# Patient Record
Sex: Female | Born: 1945
Health system: Southern US, Community
[De-identification: ages and names within clinical notes are randomized; demographics above are authoritative.]

## PROBLEM LIST (undated history)

## (undated) DIAGNOSIS — G459 Transient cerebral ischemic attack, unspecified: Secondary | ICD-10-CM

## (undated) DIAGNOSIS — F419 Anxiety disorder, unspecified: Secondary | ICD-10-CM

## (undated) DIAGNOSIS — E7800 Pure hypercholesterolemia, unspecified: Secondary | ICD-10-CM

## (undated) DIAGNOSIS — K219 Gastro-esophageal reflux disease without esophagitis: Secondary | ICD-10-CM

## (undated) DIAGNOSIS — D649 Anemia, unspecified: Secondary | ICD-10-CM

## (undated) DIAGNOSIS — M810 Age-related osteoporosis without current pathological fracture: Secondary | ICD-10-CM

## (undated) DIAGNOSIS — R112 Nausea with vomiting, unspecified: Secondary | ICD-10-CM

## (undated) DIAGNOSIS — T4145XA Adverse effect of unspecified anesthetic, initial encounter: Secondary | ICD-10-CM

## (undated) DIAGNOSIS — B958 Unspecified staphylococcus as the cause of diseases classified elsewhere: Secondary | ICD-10-CM

## (undated) DIAGNOSIS — E039 Hypothyroidism, unspecified: Secondary | ICD-10-CM

## (undated) DIAGNOSIS — F32A Depression, unspecified: Secondary | ICD-10-CM

## (undated) DIAGNOSIS — M1990 Unspecified osteoarthritis, unspecified site: Secondary | ICD-10-CM

## (undated) DIAGNOSIS — I10 Essential (primary) hypertension: Secondary | ICD-10-CM

## (undated) DIAGNOSIS — T8859XA Other complications of anesthesia, initial encounter: Secondary | ICD-10-CM

## (undated) DIAGNOSIS — F329 Major depressive disorder, single episode, unspecified: Secondary | ICD-10-CM

## (undated) DIAGNOSIS — Z9889 Other specified postprocedural states: Secondary | ICD-10-CM

## (undated) HISTORY — PX: HIP ARTHROPLASTY: SHX981

## (undated) HISTORY — PX: JOINT REPLACEMENT: SHX530

## (undated) HISTORY — PX: FOOT SURGERY: SHX648

## (undated) HISTORY — PX: THYROID SURGERY: SHX805

## (undated) HISTORY — PX: THYROIDECTOMY, PARTIAL: SHX18

## (undated) HISTORY — DX: Transient cerebral ischemic attack, unspecified: G45.9

## (undated) HISTORY — PX: LUMBAR SPINE SURGERY: SHX701

## (undated) HISTORY — PX: COLON SURGERY: SHX602

## (undated) HISTORY — PX: HERNIA REPAIR: SHX51

## (undated) HISTORY — PX: CERVICAL FUSION: SHX112

## (undated) HISTORY — PX: ABDOMINAL HYSTERECTOMY: SHX81

## (undated) HISTORY — PX: REPLACEMENT TOTAL KNEE: SUR1224

---
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
## 2000-10-30 ENCOUNTER — Other Ambulatory Visit: Admission: RE | Admit: 2000-10-30 | Discharge: 2000-10-30 | Payer: Self-pay | Admitting: *Deleted

## 2005-09-25 ENCOUNTER — Inpatient Hospital Stay (HOSPITAL_COMMUNITY): Admission: RE | Admit: 2005-09-25 | Discharge: 2005-09-29 | Payer: Self-pay | Admitting: Orthopedic Surgery

## 2005-09-27 ENCOUNTER — Ambulatory Visit: Payer: Self-pay | Admitting: Physical Medicine & Rehabilitation

## 2007-09-04 ENCOUNTER — Ambulatory Visit (HOSPITAL_BASED_OUTPATIENT_CLINIC_OR_DEPARTMENT_OTHER): Admission: RE | Admit: 2007-09-04 | Discharge: 2007-09-04 | Payer: Self-pay | Admitting: Orthopedic Surgery

## 2008-04-01 ENCOUNTER — Ambulatory Visit (HOSPITAL_COMMUNITY): Admission: RE | Admit: 2008-04-01 | Discharge: 2008-04-01 | Payer: Self-pay | Admitting: Internal Medicine

## 2009-08-31 ENCOUNTER — Emergency Department (HOSPITAL_COMMUNITY): Admission: EM | Admit: 2009-08-31 | Discharge: 2009-08-31 | Payer: Self-pay | Admitting: Emergency Medicine

## 2009-10-18 ENCOUNTER — Encounter: Payer: Self-pay | Admitting: Orthopedic Surgery

## 2009-10-27 ENCOUNTER — Ambulatory Visit: Payer: Self-pay | Admitting: Orthopedic Surgery

## 2009-10-27 DIAGNOSIS — S5000XA Contusion of unspecified elbow, initial encounter: Secondary | ICD-10-CM | POA: Insufficient documentation

## 2009-10-27 DIAGNOSIS — S53106A Unspecified dislocation of unspecified ulnohumeral joint, initial encounter: Secondary | ICD-10-CM | POA: Insufficient documentation

## 2010-07-04 NOTE — Assessment & Plan Note (Signed)
Summary: fx elbow bringing film/guarino/bsf   Vital Signs:  Patient profile:   65 year old female Height:      61 inches Weight:      202 pounds Pulse rate:   70 / minute Resp:     16 per minute  Vitals Entered By: Fuller Canada MD (Oct 27, 2009 11:29 AM)  Visit Type:  new patinet Referring Provider:  Dr. Wende Crease Primary Provider:  Dr. Regino Schultze  CC:  right elbow.  History of Present Illness: I saw [name] in the office today for an initial visit.  She is a 65 years old woman with the complaint of:  right elbow pain.  DOI 10/24/09 fell. she fell with her elbow in flexion had a slight loss of flexion in the elbow and went to the Dr. Izetta Dakin straighten her arm after she moved it some more she was able to straighten it to get the x-ray there was questionable radial head fracture she does not have any pain today except when she tries to open a door and that's only occasionally.  Tramadol now for pain.  Xrays [facility]'s office 10/26/09.  Declined sling.  Meds: Tramadol, Synthroid, Diclofenac, Biopr/HCTZ/  Allergies (verified): 1)  ! Sulfa  Past History:  Past Medical History: thyroid arthritis  Past Surgical History: thyroidectomy hysterectomy cervical fusion hernia lower back MRSA infection knee replacemnt hip replacement bunion removed  Family History: FH of Cancer:  Family History of Diabetes Hx, family, chronic respiratory condition  Social History: Patient is widowed.  volunteer no smoking no alcohl  1 cup coffee and 24 oz diet pepsi  Review of Systems Constitutional:  Complains of fatigue; denies weight loss, weight gain, fever, and chills. Respiratory:  Complains of snoring; denies short of breath, wheezing, couch, tightness, pain on inspiration, and snoring . Musculoskeletal:  Complains of joint pain, instability, and stiffness; denies swelling, redness, heat, and muscle pain. Psychiatric:  Complains of depression; denies nervousness, anxiety,  and hallucinations.  The review of systems is negative for Cardiovascular, Gastrointestinal, Genitourinary, Neurologic, Endocrine, Skin, HEENT, Immunology, and Hemoatologic.  Physical Exam  Msk:  no deformity or scoliosis noted with normal posture and gait Pulses:  pulses normal in all 4 extremities Extremities:  full range of motion now in the RIGHT elbow with no tenderness along the bones. Neurologic:  no focal deficits, CN II-XII grossly intact with normal reflexes, coordination, muscle strength and tone Skin:  intact without lesions or rashes Psych:  alert and cooperative; normal mood and affect; normal attention span and concentration   Impression & Recommendations:  Problem # 1:  SUBLUXATION, RADIAL HEAD (ICD-832.00) Assessment New  Orders: New Patient Level II (16109)  Problem # 2:  CONTUSION, ELBOW (ICD-923.11) Assessment: New  Orders: New Patient Level II (60454)  Patient Instructions: 1)  PROBABLE SUBLUXATION 2)  USE AS TOLERATED  3)  LIMIT LIFTING FOR 1 MONTH

## 2010-07-04 NOTE — Letter (Signed)
Summary: Historay form  Historay form   Imported By: Jacklynn Ganong 10/28/2009 09:33:04  _____________________________________________________________________  External Attachment:    Type:   Image     Comment:   External Document

## 2010-08-28 LAB — BASIC METABOLIC PANEL
BUN: 9 mg/dL (ref 6–23)
GFR calc Af Amer: >60 mL/min (ref >60)
GFR calc non Af Amer: >60 mL/min (ref >60)
Potassium: 3.8 mEq/L (ref 3.5–5.1)
Sodium: 131 mEq/L — ABNORMAL LOW (ref 135–145)

## 2010-08-28 LAB — CBC
HCT: 39.7 % (ref 36.0–46.0)
MCHC: 35.5 g/dL (ref 30.0–36.0)
MCV: 90.2 fL (ref 78.0–100.0)
Platelets: 234 10*3/uL (ref 150–400)
RBC: 4.4 MIL/uL (ref 3.87–5.11)
WBC: 6.6 10*3/uL (ref 4.0–10.5)

## 2010-10-17 NOTE — Op Note (Signed)
NAMEMAYLEY, [name]              ACCOUNT NO.:  [id number]   MEDICAL RECORD NO.:  [id number]          PATIENT TYPE:  AMB   LOCATION:  DSC                          FACILITY:  [facility]   PHYSICIAN:  Rodney A. Mortenson, M.D.DATE OF BIRTH:  1945/06/15   DATE OF PROCEDURE:  09/04/2007  DATE OF DISCHARGE:                               OPERATIVE REPORT   JUSTIFICATION:  A 65 year old female with a rather long history of pain  about the left foot.  She has had progressive hallux valgus deformity  and a huge bunion in these areas.  This was now become quite painful.  She is now admitted for correction of the hallux valgus and bunionectomy  and osteotomy of the first metatarsal.  Questions were answered and  encouraged preoperatively.  Complications discussed extensively.  The  patient wished to proceed.   JUSTIFICATION FOR OUTPATIENT SURGERY:  Minimal morbidity.   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   OPERATION:  Silver bunionectomy and advancement medial capsule,  metacarpophalangeal joint left great toe; crescent osteotomy base first  metatarsal left foot and bone grafting.   SURGEON:  Lenard Galloway. Chaney Malling, M.D.   ANESTHESIA:  General.   PROCEDURE:  The patient placed on the operating table in supine  position.  Pneumatic tourniquet about the left upper extremity.  After  satisfactory general anesthesia, left lower extremity was prepped with  DuraPrep and draped out in the usual manner.  Leg was wrapped out in  Esmarch and tourniquet was elevated.  Attention was first turned to the  great toe MP joint.  Incision made on the medial border at  the base of  proximal phalanx and carried proximally to the large exostosis.  Skin  edges were retracted.  Great care was taken to avoid injury to  superficial cutaneous nerves.  The capsule was cleared and a flap of the  capsule placed distally was made.  The capsule was flipped distally.  This exposed the huge exostosis and a significant hallux valgus  deformity.  The valgus deformity could be corrected very easily. There  was no tightness of the adductor as we assumed preoperatively.  There  was huge exostosis we then removed.  A power saw and a rongeur was used  to smooth up the edges.  Saline was used to flush the joint out from  bony debris.  Excellent decompression capsule was achieved.  The toe was  then placed in an anatomic position and the capsule advanced proximally  and sutured together with 2-0 Vicryl.  This realigned the toe very  nicely.  Attention turned to the base of the first metatarsal.  Skin  incision made dorsally.  Skin edges were retracted.  Dissection carried  down to the joint.  C-arm was used throughout to localize the first  metatarsal, first cuneiform  joint.  About 1-1.5 cm distal to the joint.  A crescent osteotomy saw was used with the dome placed distally.  Osteotomy was then made.  The first metatarsal was then brought from its  deformity and realigned with the second toe.  This was held in position  very snugly and a large Steinmann pin was placed across the first  metatarsal into the first cuneiform and the metatarsal joints.  X-rays  were taken and there was excellent realignment of the first metatarsal  primus varus deformity.  First and second metatarsal now exactly  parallel.  I was very pleased with the position.  The end of the wire  was then amputated.  The exostosis which removed from the great toe MP  joint was debrided of cartilage and ground up in small fragments and  this was placed about the osteotomy site at the base of the first   metatarsal as a bone graft.  4-0 nylon was then used to close the skin  edges both proximally and distally.  A large bulky pressure dressing  applied and the patient returned to the recovery room in excellent  condition.  Technically this procedure went extremely well and I was  very pleased with the final x-ray and surgical outcome.   DISPOSITION:  1. She may weightbear on the heel as tolerated.  2. Percocet for pain.  3. Usual postop instructions.  4. Return to my office on Wednesday.      Rodney A. Chaney Malling, M.D.  Electronically Signed     RAM/MEDQ  D:  09/04/2007  T:  09/04/2007  Job:  [id number]

## 2010-10-20 NOTE — Discharge Summary (Signed)
[name], [name]              ACCOUNT NO.:  [id number]   MEDICAL RECORD NO.:  [id number]          PATIENT TYPE:  INP   LOCATION:  5018                         FACILITY:  [facility]   PHYSICIAN:  Rodney A. Mortenson, M.D.DATE OF BIRTH:  1945/08/30   DATE OF ADMISSION:  09/25/2005  DATE OF DISCHARGE:  09/29/2005                                 DISCHARGE SUMMARY   ADMISSION DIAGNOSES:  1.  End-stage osteoarthritis/avascular necrosis right hip.  2.  Hypothyroidism.  3.  Hypertension.  4.  Obesity.  5.  History of right total knee arthroplasty.  6.  History of staphylococcal infection abdomen after previous abdominal      surgery.   DISCHARGE DIAGNOSES:  1.  Status post right total hip arthroplasty.  2.  Acute blood loss anemia secondary to surgery without symptoms of anemia.  3.  Tachycardic secondary to probable anxiety and pain.  4.  Hypothyroidism.  5.  Hypertension.  6.  Obesity.  7.  History of right total knee arthroplasty.  8.  History of staphylococcal infection abdomen after previous abdominal      surgery.   HISTORY OF PRESENT ILLNESS:  [name] is a 65 year old white female with  a history of right knee pain since replacement done in 2001 at [place].  The  patient has intermittent complaints of right hip pain for the past 6 years  or so; however, the patient with increased pain over the last 4 months.  No  known injury to the hip.  She did have bone graft harvest from the iliac  crest on the right side for a cervical fusion.   At this point, the right hip pain is described as constant, aching sensation  in the buttocks and groin region with some radiation to the mid thigh.  The  patient describes the pain as a spasm-like pain which is increased with  walking or standing, decreased with lying down flat.  Mechanical symptoms  positive for catching, grinding, and giving-way of the right hip.  She does  have waking pain.  The patient had difficulty donning shoes,  socks.  The  patient take Vicodin and diclofenac for pain which gives her a moderate  amount of relief.  The patient does use a wheelchair to lean on at times  when she has to ambulate long distances.   ALLERGIES:  SULFA causes facial edema.   CURRENT MEDICATIONS:  1.  Premarin 0.65 mg one tablet p.o. q.a.m.  2.  Synthroid 0.1 mg one tablet p.o. q.a.m.  3.  Lisinopril 10 mg one tablet p.o. q.a.m.  4.  Diclofenac 75 mg one tablet p.o. b.i.d., last dose September 18, 2005.  5.  Vicodin 5/325 one to two tablets every 4-6 hours for pain.   SURGICAL PROCEDURE:  The patient was taken to the operating room on September 25, 2005, by Dr. Rinaldo Ratel, assisted by Dr. Norlene Campbell and Rexene Edison, P.A.-C.  The patient was placed under general anesthesia and  underwent a right total hip arthroplasty using the following components:  A  large AMK porous-coated stem, 13 x 5 mm,  with a Pinnacle Marathon acetabular  liner, 52-mm outside diameter and 32-mm inside diameter porous coated; Apex  hole eliminator; and a Pinnacle 100 acetabular cup with a 52-mm outside  diameter and 32 -mm femoral head with a +5 ball.  It should also be noted  that the patient had removal of a large lipoma in the middle of the  incision.  The patient tolerated the procedure well and returned to recovery  in good stable condition.   CONSULTS:  The following consults were ordered while the patient was  hospitalized:  PT, OT, case management, rehab.   HOSPITAL COURSE:  Postoperative day #1 the patient afebrile, vital signs  stable.  H&H 11.7 and 31.1.  White count was 8900.  The patient's pain under  good control.  The patient with poor appetite, otherwise doing well.   Postoperative day #2 the patient with some nausea which was improving.  T-  max was 101.3.  Vital signs otherwise stable.  H&H 9.6 and 27.7.  White  count 9600.   Postoperative day #3 the patient denies chest pain, shortness of breath,  nausea, vomiting,  dysuria.  No weakness or dizziness when up with PT.  Some  anxiety secondary to previous postoperative infection.  The patient also  with increased pain overnight and early this a.m.  Continued to slowly  advance diet.  The patient was afebrile, tachycardic at 110.  Otherwise,  blood pressure was 115/70, respiratory rate at 20, O2 saturations 93% on  room air.  H&H 9.2, 26.4.  White count was 7400.  UA showed few bacteria,  positive for protein but negative for leukocytes or nitrites.  White blood  cells were 0-2.  The patient's right hip with moderate serosanguineous  drainage, some bleeding; otherwise, wound without signs of infection.  Tension sutures and staples intact, skin well approximated.  K-pad was  applied to the right hip due to the serous drainage.  Tachycardia without  any other symptoms of anemia was felt to be secondary to anxiety and pain  and pain control was to be the goal for postoperative day #3.  Otherwise,  the patient worked with physical therapy and probable discharge in a.m.   Postoperative day #4 the patient doing well and was discharged home.   LABORATORY:  Routine labs on admission:  CBC dated September 19, 2005:  White  count was 4700, hemoglobin 15.1, hematocrit 42.5, platelets 267.  Coags:  All values within normal limits.  Routine chemistries on admission:  Sodium  was low at 132, potassium 4.7, chloride 96, bicarb 25, glucose was 97, BUN  was low at 5, creatinine 0.8, calcium was 9.5.  Hepatic enzymes on  admission:  All values were within normal limits.  UA on admission:  All  values within normal limits.  X-rays:  Two-view hip dated September 25, 2005,  showed a right hip prosthesis in appropriate position, no complicating  features.  EKG dated September 19, 2005, showed normal sinus rhythm, incomplete  bundle-branch block.  Heart rate was 73 beats per minute, PR interval 150  milliseconds, PRT axis 48, and __________ of 6362.   DISCHARGE INSTRUCTIONS: 1.   Medications:  The patient was to resume home medications except for      Vicodin while on Percocet.  No aspirin while on Arixtra.  These      medications were added:      1.  Arixtra 2.5 mg one injection daily at 7 a.m., last dose Oct 05, 2005.  2.  Percocet 5/325 one to two tablets every 4-6 hours for pain.      3.  Iron 325 mg one tablet daily x28 days.      4.  Once finished with Arixtra add 81 mg aspirin once daily x20 days.  2.  Diet:  No restrictions.  3.  Activity:  The patient is partial weightbearing on the right leg with a      walker.  4.  Wound care:  The patient is to keep wound clean and dry, change dressing      daily.  May shower after 2 days with no drainage.  Call office if any      signs of infection or poor pain control.  5.  Followup:  The patient needs follow up with Dr. Chaney Malling the following      Wednesday from discharge.  The patient is to call for office at [phone]      for appointment.  6.  Home health PT per [place].      Richardean Canal, P.A.    ______________________________  Lenard Galloway Chaney Malling, M.D.    GC/MEDQ  D:  11/12/2005  T:  11/12/2005  Job:  [id number]

## 2010-10-20 NOTE — H&P (Signed)
[name], [name]              ACCOUNT NO.:  [id number]   MEDICAL RECORD NO.:  [id number]           PATIENT TYPE:   LOCATION:                                 FACILITY:   PHYSICIAN:  Legrand Pitts. Duffy, P.A.   DATE OF BIRTH:  Jan 19, 1946   DATE OF ADMISSION:  09/25/2005  DATE OF DISCHARGE:                                HISTORY & PHYSICAL   CHIEF COMPLAINT:  Right hip pain since 2001.   HISTORY OF PRESENT ILLNESS:  This 65 year old white female patient presented  to Dr. Chaney Malling with a history of a right knee replacement by a doctor in  [place] in 2001.  She has had intermittent complaints of right hip pain for  the last six years or so but over the last four months, she had a real  sudden onset of fairly severe right hip pain.  She has had no specific  injury to her hip, and the only surgery she has had was a bone graft harvest  from the iliac crest for a cervical fusion.   At this point the right hip pain is described as a constant aching sensation  in the right buttock and groin area with some radiation into the midthigh.  It feels almost like a spasm in that area.  Pain increases with walking or  standing and then decreases with lying down flat, ice to the area or  occasionally heat to her thigh.  The hip does catch, grind and give a way at  times.  It occasionally keeps her up at night, and she has difficulty  putting on her socks and shoes, especially sliding her foot in her shoe.  No  popping, and swelling or her back pain or paresthesias.  She is currently  taking Vicodin and diclofenac for pain, and that provides a moderate amount  of relief.  She does lean on a wheelchair at times when she has to walk a  long distance.   ALLERGIES:  SULFA causes facial edema.   CURRENT MEDICATIONS:  1.  Premarin 0.65 mg one tablet p.o. q.a.m..  2.  Synthroid 0.1 mg one tablet p.o. q.a.m.Marland Kitchen  3.  Lisinopril 10 mg one tablet p.o. q.a.m.Marland Kitchen  4.  Diclofenac 75 mg one tablet p.o. b.i.d., last  dose to be September 18, 2005.  5.  Vicodin 5/325 mg one to two tablets p.o. q.4-6h. p.r.n. for pain.   PAST MEDICAL HISTORY:  1.  Hypothyroidism after surgery for calcification in the thyroid gland.  2.  Hypertension.   She denies any history of diabetes mellitus, hiatal hernia, reflux, peptic  ulcer disease, heart disease or any other chronic medical condition other  than noted previously.   PAST SURGICAL HISTORY:  1.  Partial thyroidectomy, 1992.  2.  Hysterectomy, 1993.  3.  Cervical fusion with bone graft from the right hip, 1994.  4.  Exploratory laparotomy due to intestinal blockage complicated by postop      staph infection, 1995.  5.  Hernia surgery, 1995.  6.  Lumbar laminectomy, 1997.  7.  Exploratory laparotomy due to return of her staph  infection, 2000.  8.  Right total knee arthroplasty, 2001.  9.  Right neck Groshong catheter, 1995.   The only complication she has had from surgery was the staph infection.   SOCIAL HISTORY:  She denies any history of cigarette smoking, alcohol use or  drug use.  She is a widow, and her husband died five years ago.  She lives  with one person, her mom, in a one-story house.  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  Her medical doctor is Dr. Karleen Hampshire  in [place].   FAMILY HISTORY:  Mother has a history of heart disease, hypertension and  diabetes.  Father had a history of her heart attack and heart disease.  Grandmother had a history of breast cancer.   She does wear glasses.  She does have a history of hypertension and the  thyroid problems as previously indicated.  All other systems are negative  and noncontributory.   PHYSICAL EXAMINATION:  GENERAL:  Well-developed, well-nourished, overweight  white female in no acute distress.  Walks with a limp and an antalgic gait.  Mood and affect are appropriate.  Talks easily with examiner.  Height 5 feet  3 inches, weight 196 pounds, BMI 34.  VITAL SIGNS:   Temperature 98 degrees Fahrenheit, pulse 88, respirations 16  and BP 156/84.  HEENT:  Normocephalic, atraumatic, without frontal or maxillary sinus  tenderness to palpation.  Conjunctivae pink.  Sclerae anicteric.  PERRLA.  EOMs intact.  Glasses in place.  Hearing grossly intact.  No visible  external ear deformities.  Tympanic membranes pearly gray bilaterally with  good light reflex.  Nose: The nasal septum midline.  Nasal mucosa pink and  moist without exudates or polyps noted.  Buccal mucosa pink and moist.  Dentition in good repair.  Pharynx without erythema or exudates.  Tongue and  uvula midline.  Tongue without fasciculations and uvula rises equally with  phonation.  NECK:  No visible masses or lesions noted other than a well-healed thyroid  scar at the base of the neck.  Trachea midline.  No palpable lymphadenopathy  or thyromegaly.  Carotids +2 bilaterally without bruits.  Full range of  motion, nontender to palpation along the cervical spine.  CARDIOVASCULAR:  Heart rate and rhythm regular.  S1, S2 present without  rubs, clicks or murmurs noted.  RESPIRATORY:  Respirations even and unlabored.  Breath sounds clear to  auscultation bilaterally without rales or wheezes noted.  ABDOMEN:  Well-healed midline abdominal incision.  Bowel sounds present x4  quadrants.  Soft, nontender to palpation without hepatosplenomegaly or CVA  tenderness.  BACK:  Well-healed low lumbar incision line.  Mild tenderness to palpation  over the healed incision line.  No other tenderness to palpation along the  vertebral column.  VASCULAR:  Femoral pulses +2 bilaterally.  BREASTS/GENITOURINARY/RECTAL/PELVIC:  These exams deferred at this time.  MUSCULOSKELETAL:  No obvious deformities bilateral upper extremities with  full range of motion of these extremities without pain.  She has full range of motion of her left hip and knee and bilateral ankles and toes.  DP and PT  pulses are +2.  No lower  extremity edema.  No calf pain with palpation and  negative Homans' sign bilaterally.   Right knee has a well-healed midline incision.  She has full extension and  flexion to 100 degrees without pain or crepitus.  No pain with palpation  about the knee.  It is stable to varus and valgus stress.  Negative anterior  drawer.  She has full extension of the hip but flexion only about 80-85  degrees.  Past that does cause pain.  She has about 20-30 degrees of  external rotation but no internal rotation, and attempts at that may cause  pain and her pelvis to rotate.  No pain with palpation about the hip itself  but pain with range of motion.   NEUROLOGIC:  Alert and oriented x3.  Cranial nerves II-XII are grossly  intact.  Strength 5/5 bilateral upper and lower extremities.  Rapid  alternating movements intact.  Deep tendon reflexes 2+ bilateral upper and  lower extremities.  Sensation intact to light touch.   RADIOLOGIC FINDINGS:  X-rays taken of her right hip in March 2007 show bone-  on-bone contact in the hip with what appears to be AVN of the femoral head  with flattening of the lateral half of the head.   IMPRESSION:  1.  End-stage osteoarthritis/avascular necrosis of the right hip.  2.  Hypothyroidism.  3.  Hypertension.  4.  Obesity.  5.  History of right total knee.  6.  History of staphylococcal infection in her abdomen after previous      abdominal surgery.   PLAN:  Ms. Heasley will be admitted to [facility] on September [facility], 2005, where she will undergo a right total hip arthroplasty by Dr. Rinaldo Ratel.  She will undergo all the routine preoperative laboratory tests  and studies prior to this procedure.  If she has any medical issues while  she is hospitalized, we will consult one the hospitalists.      Legrand Pitts Duffy, P.A.     KED/MEDQ  D:  09/12/2005  T:  09/12/2005  Job:  [id number]

## 2010-10-20 NOTE — Op Note (Signed)
[name], [name]              ACCOUNT NO.:  [id number]   MEDICAL RECORD NO.:  [id number]          PATIENT TYPE:  INP   LOCATION:  2899                         FACILITY:  [facility]   PHYSICIAN:  Lenard Galloway. Mortenson, M.D.DATE OF BIRTH:  1946/05/02   DATE OF PROCEDURE:  09/25/2005  DATE OF DISCHARGE:                                 OPERATIVE REPORT   PREOPERATIVE DIAGNOSIS:  End stage osteoarthritis, right hip.   POSTOPERATIVE DIAGNOSIS:  End stage osteoarthritis, right hip.   OPERATION PERFORMED:  Total hip replacement on the right using a large AMK  pore coated stem 13.5 mm with a Pinnacle Marathon acetabular liner, 52 mm  outside diameter, 32 mm inside diameter pore coated,  Apex hole eliminator,  and a Pinnacle 100 acetabular cup with a 52 mm outside diameter and a 32 mm  femoral head with a +5 ball; removal of large lipoma in middle of incision.   SURGEON:  Lenard Galloway. Chaney Malling, M.D.   ASSISTANT:  Claude Manges. Cleophas Dunker, M.D., [facility].   ANESTHESIA:  General.   DESCRIPTION OF PROCEDURE:  After satisfactory general anesthesia, the  patient was placed in full lateral position, right hip up.  Right lower  extremity was prepped with DuraPrep and draped out in the usual manner.  A  Vi-drape was placed over the operative site.  Posterior Southern incision  was made.  There was a huge lipoma directly in alignment with the incision.  The skin edges were retracted.  Bleeders were coagulated.  The lipoma was  debulked so access to tensor fascia lata and deeper recess could be  achieved.  A large self-retaining retractor was put in place.  Bleeders were  coagulated.  Tensor fascia lata was then split.  External rotators were then  identified and isolated.  A tag was placed in the piriformis.  It was  released and retracted to the midline.  The posterior part of the hip was  clearly seen.  This was the capsule.  Incision was made at the superior neck  of the femur and carried down to the  inferior neck and a vertical T incision  made through the capsule.  This exposed the femoral head very nicely and the  hip was dislocated.  A Bennett was placed down to the femoral neck and the  femoral neck was cut to appropriate length and angle and head was removed.   Attention was turned to the proximal femur.  A hole initiator was then used  and a canal finder passed down the femoral canal.  A series of fully fluted  reamers was passed down.  This reamed out to a 13 mm diameter.  Series of  broaches was passed down the femoral canal and ultimately the 13.5 large  stature femoral component seated very nicely in the calcar.  This was then  removed.   Attention was then turned to the acetabulum.  Cobra retractor placed about  the rim.  The labrum was removed.  The series of cheese cutter reamers was  used and this was reamed out to a 51 mm diameter and this was medialized  slightly.  There was good bleeding bone around the entire circumference.  A  50 mm trial was placed down and seated very nicely in the acetabulum.  The  final pore coated 100 series Pinnacle cup 52 mm outside diameter was then  driven down into the acetabulum and a perfect interference scratch fit was  achieved.  A trial poly was inserted, locked into position.  The broach was  passed down the femoral canal again and a +5 neck, 32 mm trial head was  inserted on the broach and the hip was relocated.  The hip was extremely  stable in all positions.  This was felt to be the appropriate length.  Leg  lengths were equal and they were identical to what they were preoperatively.  The hip was dislocated and all the trial components were removed.  The hip  was irrigated with copious amounts of saline solution throughout the  procedure.  Apex  hole eliminator was inserted.  The Pinnacle Marathon poly  was then snap fitted in place and fitted very nicely.  The final AML large  stature prosthesis was then driven down and seated  very nicely in the  calcar.  Perfect fit was achieved.  A +5 neck with a 32 mm ball was then  passed over the trunnion and tapped in place and a cold weld was achieved.  The hip was then relocated and put through a full range of motion.  Again it  was extremely stable.  Posterior hip capsule was closed almost anatomically  with interrupted heavy Ethibond sutures.  Hemovac drain was in the wound.  Tensor fascia was closed with interrupted Ethibond sutures.  Heavy Tycron  was used to close the deep subcutaneous area.  There was a very large lipoma  which had been partially excised.  It is felt that complete evacuation of  any dead space was necessary and large Prolene mattress sutures through the  skin and down deep were used in retention suture configuration.  This closed  the deep spaces very nicely.  The superficial space was then closed with 2-0  Vicryl.  Stainless steel staples used to close the skin.  Sterile dressings  were applied and the patient returned to the recovery room in excellent  condition. Technically, this procedure went extremely well.   DRAINS:  Hemovac.   COMPLICATIONS:  None.  I am very pleased with the surgical outcome.           ______________________________  Lenard Galloway. Chaney Malling, M.D.     RAM/MEDQ  D:  09/25/2005  T:  09/26/2005  Job:  [id number]

## 2011-02-27 LAB — POCT HEMOGLOBIN-HEMACUE: Hemoglobin: 15.6 — ABNORMAL HIGH

## 2013-03-12 ENCOUNTER — Other Ambulatory Visit (HOSPITAL_COMMUNITY): Payer: Self-pay | Admitting: Internal Medicine

## 2013-03-12 DIAGNOSIS — M8580 Other specified disorders of bone density and structure, unspecified site: Secondary | ICD-10-CM

## 2013-03-18 ENCOUNTER — Other Ambulatory Visit (HOSPITAL_COMMUNITY): Payer: Self-pay

## 2013-03-18 ENCOUNTER — Ambulatory Visit (HOSPITAL_COMMUNITY)
Admission: RE | Admit: 2013-03-18 | Discharge: 2013-03-18 | Disposition: A | Discharge status: Home / Self Care | Payer: Medicare Other | Source: Ambulatory Visit | Attending: Internal Medicine | Admitting: Internal Medicine

## 2013-03-18 DIAGNOSIS — Z780 Asymptomatic menopausal state: Secondary | ICD-10-CM | POA: Insufficient documentation

## 2013-03-18 DIAGNOSIS — M899 Disorder of bone, unspecified: Secondary | ICD-10-CM | POA: Insufficient documentation

## 2013-03-18 DIAGNOSIS — M8580 Other specified disorders of bone density and structure, unspecified site: Secondary | ICD-10-CM

## 2014-06-09 DIAGNOSIS — M47812 Spondylosis without myelopathy or radiculopathy, cervical region: Secondary | ICD-10-CM | POA: Diagnosis not present

## 2014-06-09 DIAGNOSIS — M9902 Segmental and somatic dysfunction of thoracic region: Secondary | ICD-10-CM | POA: Diagnosis not present

## 2014-06-09 DIAGNOSIS — M47814 Spondylosis without myelopathy or radiculopathy, thoracic region: Secondary | ICD-10-CM | POA: Diagnosis not present

## 2014-06-09 DIAGNOSIS — M9903 Segmental and somatic dysfunction of lumbar region: Secondary | ICD-10-CM | POA: Diagnosis not present

## 2014-06-09 DIAGNOSIS — M9901 Segmental and somatic dysfunction of cervical region: Secondary | ICD-10-CM | POA: Diagnosis not present

## 2014-06-14 DIAGNOSIS — M9901 Segmental and somatic dysfunction of cervical region: Secondary | ICD-10-CM | POA: Diagnosis not present

## 2014-06-14 DIAGNOSIS — M9903 Segmental and somatic dysfunction of lumbar region: Secondary | ICD-10-CM | POA: Diagnosis not present

## 2014-06-14 DIAGNOSIS — M9902 Segmental and somatic dysfunction of thoracic region: Secondary | ICD-10-CM | POA: Diagnosis not present

## 2014-06-14 DIAGNOSIS — M47814 Spondylosis without myelopathy or radiculopathy, thoracic region: Secondary | ICD-10-CM | POA: Diagnosis not present

## 2014-06-14 DIAGNOSIS — M47812 Spondylosis without myelopathy or radiculopathy, cervical region: Secondary | ICD-10-CM | POA: Diagnosis not present

## 2014-06-17 DIAGNOSIS — M9902 Segmental and somatic dysfunction of thoracic region: Secondary | ICD-10-CM | POA: Diagnosis not present

## 2014-06-17 DIAGNOSIS — M9903 Segmental and somatic dysfunction of lumbar region: Secondary | ICD-10-CM | POA: Diagnosis not present

## 2014-06-17 DIAGNOSIS — M47812 Spondylosis without myelopathy or radiculopathy, cervical region: Secondary | ICD-10-CM | POA: Diagnosis not present

## 2014-06-17 DIAGNOSIS — M47814 Spondylosis without myelopathy or radiculopathy, thoracic region: Secondary | ICD-10-CM | POA: Diagnosis not present

## 2014-06-17 DIAGNOSIS — M9901 Segmental and somatic dysfunction of cervical region: Secondary | ICD-10-CM | POA: Diagnosis not present

## 2014-06-22 DIAGNOSIS — H524 Presbyopia: Secondary | ICD-10-CM | POA: Diagnosis not present

## 2014-06-22 DIAGNOSIS — M47814 Spondylosis without myelopathy or radiculopathy, thoracic region: Secondary | ICD-10-CM | POA: Diagnosis not present

## 2014-06-22 DIAGNOSIS — H2513 Age-related nuclear cataract, bilateral: Secondary | ICD-10-CM | POA: Diagnosis not present

## 2014-06-22 DIAGNOSIS — M9901 Segmental and somatic dysfunction of cervical region: Secondary | ICD-10-CM | POA: Diagnosis not present

## 2014-06-22 DIAGNOSIS — M47812 Spondylosis without myelopathy or radiculopathy, cervical region: Secondary | ICD-10-CM | POA: Diagnosis not present

## 2014-06-22 DIAGNOSIS — H5203 Hypermetropia, bilateral: Secondary | ICD-10-CM | POA: Diagnosis not present

## 2014-06-22 DIAGNOSIS — M9903 Segmental and somatic dysfunction of lumbar region: Secondary | ICD-10-CM | POA: Diagnosis not present

## 2014-06-22 DIAGNOSIS — M9902 Segmental and somatic dysfunction of thoracic region: Secondary | ICD-10-CM | POA: Diagnosis not present

## 2014-07-06 DIAGNOSIS — M47814 Spondylosis without myelopathy or radiculopathy, thoracic region: Secondary | ICD-10-CM | POA: Diagnosis not present

## 2014-07-06 DIAGNOSIS — M47812 Spondylosis without myelopathy or radiculopathy, cervical region: Secondary | ICD-10-CM | POA: Diagnosis not present

## 2014-07-06 DIAGNOSIS — M9901 Segmental and somatic dysfunction of cervical region: Secondary | ICD-10-CM | POA: Diagnosis not present

## 2014-07-06 DIAGNOSIS — M9902 Segmental and somatic dysfunction of thoracic region: Secondary | ICD-10-CM | POA: Diagnosis not present

## 2014-07-06 DIAGNOSIS — M9903 Segmental and somatic dysfunction of lumbar region: Secondary | ICD-10-CM | POA: Diagnosis not present

## 2014-07-20 DIAGNOSIS — M9903 Segmental and somatic dysfunction of lumbar region: Secondary | ICD-10-CM | POA: Diagnosis not present

## 2014-07-20 DIAGNOSIS — M9902 Segmental and somatic dysfunction of thoracic region: Secondary | ICD-10-CM | POA: Diagnosis not present

## 2014-07-20 DIAGNOSIS — M47812 Spondylosis without myelopathy or radiculopathy, cervical region: Secondary | ICD-10-CM | POA: Diagnosis not present

## 2014-07-20 DIAGNOSIS — M47814 Spondylosis without myelopathy or radiculopathy, thoracic region: Secondary | ICD-10-CM | POA: Diagnosis not present

## 2014-07-20 DIAGNOSIS — M9901 Segmental and somatic dysfunction of cervical region: Secondary | ICD-10-CM | POA: Diagnosis not present

## 2014-08-03 DIAGNOSIS — M9903 Segmental and somatic dysfunction of lumbar region: Secondary | ICD-10-CM | POA: Diagnosis not present

## 2014-08-03 DIAGNOSIS — M9901 Segmental and somatic dysfunction of cervical region: Secondary | ICD-10-CM | POA: Diagnosis not present

## 2014-08-03 DIAGNOSIS — M47814 Spondylosis without myelopathy or radiculopathy, thoracic region: Secondary | ICD-10-CM | POA: Diagnosis not present

## 2014-08-03 DIAGNOSIS — M47812 Spondylosis without myelopathy or radiculopathy, cervical region: Secondary | ICD-10-CM | POA: Diagnosis not present

## 2014-08-03 DIAGNOSIS — M9902 Segmental and somatic dysfunction of thoracic region: Secondary | ICD-10-CM | POA: Diagnosis not present

## 2014-08-17 DIAGNOSIS — M47812 Spondylosis without myelopathy or radiculopathy, cervical region: Secondary | ICD-10-CM | POA: Diagnosis not present

## 2014-08-17 DIAGNOSIS — M9903 Segmental and somatic dysfunction of lumbar region: Secondary | ICD-10-CM | POA: Diagnosis not present

## 2014-08-17 DIAGNOSIS — M9902 Segmental and somatic dysfunction of thoracic region: Secondary | ICD-10-CM | POA: Diagnosis not present

## 2014-08-17 DIAGNOSIS — M9901 Segmental and somatic dysfunction of cervical region: Secondary | ICD-10-CM | POA: Diagnosis not present

## 2014-08-17 DIAGNOSIS — M47814 Spondylosis without myelopathy or radiculopathy, thoracic region: Secondary | ICD-10-CM | POA: Diagnosis not present

## 2014-08-18 DIAGNOSIS — I10 Essential (primary) hypertension: Secondary | ICD-10-CM | POA: Diagnosis not present

## 2014-08-18 DIAGNOSIS — E039 Hypothyroidism, unspecified: Secondary | ICD-10-CM | POA: Diagnosis not present

## 2014-08-20 DIAGNOSIS — E039 Hypothyroidism, unspecified: Secondary | ICD-10-CM | POA: Diagnosis not present

## 2014-08-20 DIAGNOSIS — G5621 Lesion of ulnar nerve, right upper limb: Secondary | ICD-10-CM | POA: Diagnosis not present

## 2014-08-20 DIAGNOSIS — E782 Mixed hyperlipidemia: Secondary | ICD-10-CM | POA: Diagnosis not present

## 2014-08-20 DIAGNOSIS — I10 Essential (primary) hypertension: Secondary | ICD-10-CM | POA: Diagnosis not present

## 2014-09-29 DIAGNOSIS — D1801 Hemangioma of skin and subcutaneous tissue: Secondary | ICD-10-CM | POA: Diagnosis not present

## 2014-12-07 ENCOUNTER — Ambulatory Visit (HOSPITAL_COMMUNITY)
Admission: RE | Admit: 2014-12-07 | Discharge: 2014-12-07 | Disposition: A | Discharge status: Home / Self Care | Payer: Medicare Other | Source: Ambulatory Visit | Attending: Internal Medicine | Admitting: Internal Medicine

## 2014-12-07 ENCOUNTER — Other Ambulatory Visit (HOSPITAL_COMMUNITY): Payer: Self-pay | Admitting: Internal Medicine

## 2014-12-07 DIAGNOSIS — R071 Chest pain on breathing: Secondary | ICD-10-CM

## 2014-12-07 DIAGNOSIS — S299XXA Unspecified injury of thorax, initial encounter: Secondary | ICD-10-CM | POA: Diagnosis not present

## 2014-12-07 DIAGNOSIS — Y939 Activity, unspecified: Secondary | ICD-10-CM | POA: Diagnosis not present

## 2014-12-07 DIAGNOSIS — R079 Chest pain, unspecified: Secondary | ICD-10-CM | POA: Diagnosis not present

## 2015-01-12 DIAGNOSIS — M9901 Segmental and somatic dysfunction of cervical region: Secondary | ICD-10-CM | POA: Diagnosis not present

## 2015-01-12 DIAGNOSIS — M9902 Segmental and somatic dysfunction of thoracic region: Secondary | ICD-10-CM | POA: Diagnosis not present

## 2015-01-12 DIAGNOSIS — M9903 Segmental and somatic dysfunction of lumbar region: Secondary | ICD-10-CM | POA: Diagnosis not present

## 2015-01-12 DIAGNOSIS — M47812 Spondylosis without myelopathy or radiculopathy, cervical region: Secondary | ICD-10-CM | POA: Diagnosis not present

## 2015-01-12 DIAGNOSIS — M47814 Spondylosis without myelopathy or radiculopathy, thoracic region: Secondary | ICD-10-CM | POA: Diagnosis not present

## 2015-01-17 DIAGNOSIS — M9902 Segmental and somatic dysfunction of thoracic region: Secondary | ICD-10-CM | POA: Diagnosis not present

## 2015-01-17 DIAGNOSIS — M47812 Spondylosis without myelopathy or radiculopathy, cervical region: Secondary | ICD-10-CM | POA: Diagnosis not present

## 2015-01-17 DIAGNOSIS — M9903 Segmental and somatic dysfunction of lumbar region: Secondary | ICD-10-CM | POA: Diagnosis not present

## 2015-01-17 DIAGNOSIS — M9901 Segmental and somatic dysfunction of cervical region: Secondary | ICD-10-CM | POA: Diagnosis not present

## 2015-01-17 DIAGNOSIS — M47814 Spondylosis without myelopathy or radiculopathy, thoracic region: Secondary | ICD-10-CM | POA: Diagnosis not present

## 2015-03-02 DIAGNOSIS — E782 Mixed hyperlipidemia: Secondary | ICD-10-CM | POA: Diagnosis not present

## 2015-03-02 DIAGNOSIS — E039 Hypothyroidism, unspecified: Secondary | ICD-10-CM | POA: Diagnosis not present

## 2015-03-02 DIAGNOSIS — I10 Essential (primary) hypertension: Secondary | ICD-10-CM | POA: Diagnosis not present

## 2015-04-04 ENCOUNTER — Ambulatory Visit (HOSPITAL_COMMUNITY): Payer: No Typology Code available for payment source

## 2015-04-04 ENCOUNTER — Other Ambulatory Visit (HOSPITAL_COMMUNITY): Payer: Self-pay | Admitting: Internal Medicine

## 2015-04-04 DIAGNOSIS — R319 Hematuria, unspecified: Secondary | ICD-10-CM

## 2015-04-22 ENCOUNTER — Other Ambulatory Visit (HOSPITAL_COMMUNITY): Payer: Self-pay | Admitting: Internal Medicine

## 2015-04-22 DIAGNOSIS — Z1231 Encounter for screening mammogram for malignant neoplasm of breast: Secondary | ICD-10-CM

## 2015-05-09 ENCOUNTER — Ambulatory Visit (HOSPITAL_COMMUNITY)
Admission: RE | Admit: 2015-05-09 | Discharge: 2015-05-09 | Disposition: A | Discharge status: Home / Self Care | Payer: Medicare Other | Source: Ambulatory Visit | Attending: Internal Medicine | Admitting: Internal Medicine

## 2015-05-09 DIAGNOSIS — Z1231 Encounter for screening mammogram for malignant neoplasm of breast: Secondary | ICD-10-CM | POA: Diagnosis not present

## 2015-08-31 DIAGNOSIS — E039 Hypothyroidism, unspecified: Secondary | ICD-10-CM | POA: Diagnosis not present

## 2015-08-31 DIAGNOSIS — E782 Mixed hyperlipidemia: Secondary | ICD-10-CM | POA: Diagnosis not present

## 2015-09-02 DIAGNOSIS — E039 Hypothyroidism, unspecified: Secondary | ICD-10-CM | POA: Diagnosis not present

## 2015-09-02 DIAGNOSIS — I10 Essential (primary) hypertension: Secondary | ICD-10-CM | POA: Diagnosis not present

## 2015-09-02 DIAGNOSIS — M5010 Cervical disc disorder with radiculopathy, unspecified cervical region: Secondary | ICD-10-CM | POA: Diagnosis not present

## 2015-09-02 DIAGNOSIS — E782 Mixed hyperlipidemia: Secondary | ICD-10-CM | POA: Diagnosis not present

## 2015-09-09 DIAGNOSIS — M25462 Effusion, left knee: Secondary | ICD-10-CM | POA: Diagnosis not present

## 2015-09-09 DIAGNOSIS — G5621 Lesion of ulnar nerve, right upper limb: Secondary | ICD-10-CM | POA: Diagnosis not present

## 2015-09-09 DIAGNOSIS — S93142A Subluxation of metatarsophalangeal joint of left great toe, initial encounter: Secondary | ICD-10-CM | POA: Diagnosis not present

## 2015-09-09 DIAGNOSIS — M1712 Unilateral primary osteoarthritis, left knee: Secondary | ICD-10-CM | POA: Diagnosis not present

## 2015-09-09 DIAGNOSIS — Z7952 Long term (current) use of systemic steroids: Secondary | ICD-10-CM | POA: Diagnosis not present

## 2015-09-09 DIAGNOSIS — M19042 Primary osteoarthritis, left hand: Secondary | ICD-10-CM | POA: Diagnosis not present

## 2015-09-09 DIAGNOSIS — S93141A Subluxation of metatarsophalangeal joint of right great toe, initial encounter: Secondary | ICD-10-CM | POA: Diagnosis not present

## 2015-09-09 DIAGNOSIS — M19071 Primary osteoarthritis, right ankle and foot: Secondary | ICD-10-CM | POA: Diagnosis not present

## 2015-09-09 DIAGNOSIS — M11242 Other chondrocalcinosis, left hand: Secondary | ICD-10-CM | POA: Diagnosis not present

## 2015-09-09 DIAGNOSIS — M810 Age-related osteoporosis without current pathological fracture: Secondary | ICD-10-CM | POA: Diagnosis not present

## 2015-09-09 DIAGNOSIS — M11241 Other chondrocalcinosis, right hand: Secondary | ICD-10-CM | POA: Diagnosis not present

## 2015-09-09 DIAGNOSIS — M19041 Primary osteoarthritis, right hand: Secondary | ICD-10-CM | POA: Diagnosis not present

## 2015-09-16 DIAGNOSIS — M8588 Other specified disorders of bone density and structure, other site: Secondary | ICD-10-CM | POA: Diagnosis not present

## 2015-10-10 DIAGNOSIS — M1712 Unilateral primary osteoarthritis, left knee: Secondary | ICD-10-CM | POA: Diagnosis not present

## 2015-10-10 DIAGNOSIS — Z7952 Long term (current) use of systemic steroids: Secondary | ICD-10-CM | POA: Diagnosis not present

## 2015-10-10 DIAGNOSIS — M8589 Other specified disorders of bone density and structure, multiple sites: Secondary | ICD-10-CM | POA: Diagnosis not present

## 2015-10-10 DIAGNOSIS — M19042 Primary osteoarthritis, left hand: Secondary | ICD-10-CM | POA: Diagnosis not present

## 2015-10-10 DIAGNOSIS — M19041 Primary osteoarthritis, right hand: Secondary | ICD-10-CM | POA: Diagnosis not present

## 2015-10-10 DIAGNOSIS — M1120 Other chondrocalcinosis, unspecified site: Secondary | ICD-10-CM | POA: Diagnosis not present

## 2015-10-10 DIAGNOSIS — M19071 Primary osteoarthritis, right ankle and foot: Secondary | ICD-10-CM | POA: Diagnosis not present

## 2015-10-10 DIAGNOSIS — G5621 Lesion of ulnar nerve, right upper limb: Secondary | ICD-10-CM | POA: Diagnosis not present

## 2015-11-01 DIAGNOSIS — S7001XA Contusion of right hip, initial encounter: Secondary | ICD-10-CM | POA: Diagnosis not present

## 2015-11-24 DIAGNOSIS — E039 Hypothyroidism, unspecified: Secondary | ICD-10-CM | POA: Diagnosis not present

## 2015-11-24 DIAGNOSIS — M180 Bilateral primary osteoarthritis of first carpometacarpal joints: Secondary | ICD-10-CM | POA: Diagnosis not present

## 2015-11-24 DIAGNOSIS — M25562 Pain in left knee: Secondary | ICD-10-CM | POA: Diagnosis not present

## 2015-11-24 DIAGNOSIS — Z79899 Other long term (current) drug therapy: Secondary | ICD-10-CM | POA: Diagnosis not present

## 2015-11-24 DIAGNOSIS — M5010 Cervical disc disorder with radiculopathy, unspecified cervical region: Secondary | ICD-10-CM | POA: Diagnosis not present

## 2016-01-16 DIAGNOSIS — M19071 Primary osteoarthritis, right ankle and foot: Secondary | ICD-10-CM | POA: Diagnosis not present

## 2016-01-16 DIAGNOSIS — G5621 Lesion of ulnar nerve, right upper limb: Secondary | ICD-10-CM | POA: Diagnosis not present

## 2016-01-16 DIAGNOSIS — M1120 Other chondrocalcinosis, unspecified site: Secondary | ICD-10-CM | POA: Diagnosis not present

## 2016-01-16 DIAGNOSIS — Z7952 Long term (current) use of systemic steroids: Secondary | ICD-10-CM | POA: Diagnosis not present

## 2016-01-16 DIAGNOSIS — M19042 Primary osteoarthritis, left hand: Secondary | ICD-10-CM | POA: Diagnosis not present

## 2016-01-16 DIAGNOSIS — M1712 Unilateral primary osteoarthritis, left knee: Secondary | ICD-10-CM | POA: Diagnosis not present

## 2016-01-16 DIAGNOSIS — M8589 Other specified disorders of bone density and structure, multiple sites: Secondary | ICD-10-CM | POA: Diagnosis not present

## 2016-01-16 DIAGNOSIS — M19041 Primary osteoarthritis, right hand: Secondary | ICD-10-CM | POA: Diagnosis not present

## 2016-04-17 DIAGNOSIS — M19071 Primary osteoarthritis, right ankle and foot: Secondary | ICD-10-CM | POA: Diagnosis not present

## 2016-04-17 DIAGNOSIS — M1712 Unilateral primary osteoarthritis, left knee: Secondary | ICD-10-CM | POA: Diagnosis not present

## 2016-04-17 DIAGNOSIS — Z7952 Long term (current) use of systemic steroids: Secondary | ICD-10-CM | POA: Diagnosis not present

## 2016-04-17 DIAGNOSIS — M19041 Primary osteoarthritis, right hand: Secondary | ICD-10-CM | POA: Diagnosis not present

## 2016-04-17 DIAGNOSIS — M8589 Other specified disorders of bone density and structure, multiple sites: Secondary | ICD-10-CM | POA: Diagnosis not present

## 2016-04-17 DIAGNOSIS — G5621 Lesion of ulnar nerve, right upper limb: Secondary | ICD-10-CM | POA: Diagnosis not present

## 2016-04-17 DIAGNOSIS — M1120 Other chondrocalcinosis, unspecified site: Secondary | ICD-10-CM | POA: Diagnosis not present

## 2016-04-17 DIAGNOSIS — M19042 Primary osteoarthritis, left hand: Secondary | ICD-10-CM | POA: Diagnosis not present

## 2016-04-24 DIAGNOSIS — H2513 Age-related nuclear cataract, bilateral: Secondary | ICD-10-CM | POA: Diagnosis not present

## 2016-04-24 DIAGNOSIS — H5203 Hypermetropia, bilateral: Secondary | ICD-10-CM | POA: Diagnosis not present

## 2016-04-24 DIAGNOSIS — H52203 Unspecified astigmatism, bilateral: Secondary | ICD-10-CM | POA: Diagnosis not present

## 2016-04-24 DIAGNOSIS — H524 Presbyopia: Secondary | ICD-10-CM | POA: Diagnosis not present

## 2016-05-15 DIAGNOSIS — Z23 Encounter for immunization: Secondary | ICD-10-CM | POA: Diagnosis not present

## 2016-05-21 DIAGNOSIS — R7301 Impaired fasting glucose: Secondary | ICD-10-CM | POA: Diagnosis not present

## 2016-05-21 DIAGNOSIS — R42 Dizziness and giddiness: Secondary | ICD-10-CM | POA: Diagnosis not present

## 2016-05-21 DIAGNOSIS — R5383 Other fatigue: Secondary | ICD-10-CM | POA: Diagnosis not present

## 2016-05-21 DIAGNOSIS — I10 Essential (primary) hypertension: Secondary | ICD-10-CM | POA: Diagnosis not present

## 2016-05-21 DIAGNOSIS — R110 Nausea: Secondary | ICD-10-CM | POA: Diagnosis not present

## 2016-05-21 DIAGNOSIS — E039 Hypothyroidism, unspecified: Secondary | ICD-10-CM | POA: Diagnosis not present

## 2016-05-31 DIAGNOSIS — M25569 Pain in unspecified knee: Secondary | ICD-10-CM | POA: Diagnosis not present

## 2016-05-31 DIAGNOSIS — F430 Acute stress reaction: Secondary | ICD-10-CM | POA: Diagnosis not present

## 2016-05-31 DIAGNOSIS — E039 Hypothyroidism, unspecified: Secondary | ICD-10-CM | POA: Diagnosis not present

## 2016-06-29 DIAGNOSIS — M25562 Pain in left knee: Secondary | ICD-10-CM | POA: Diagnosis not present

## 2016-06-29 DIAGNOSIS — M542 Cervicalgia: Secondary | ICD-10-CM | POA: Diagnosis not present

## 2016-07-02 DIAGNOSIS — M25569 Pain in unspecified knee: Secondary | ICD-10-CM | POA: Diagnosis not present

## 2016-07-02 DIAGNOSIS — Z01818 Encounter for other preprocedural examination: Secondary | ICD-10-CM | POA: Diagnosis not present

## 2016-07-30 DIAGNOSIS — M25562 Pain in left knee: Secondary | ICD-10-CM | POA: Diagnosis not present

## 2016-07-30 NOTE — H&P (Signed)
PREOPERATIVE H&P Patient ID: [id number] MRN: [id number] DOB/AGE: 1945/11/06 71 y.o.  Chief Complaint: OA LEFT KNEE  Planned Procedure Date: 08/21/16 Medical and Cardiac Clearance by Dr. Wende Neighbors    HPI: [name] is a 71 y.o. female with a history of depression, thyroidectomy, R TKA, R THA, Cervical fusion, and L-spine surgery who presents for evaluation of OA LEFT KNEE. The patient has a history of pain and functional disability in the left knee due to arthritis and has failed non-surgical conservative treatments for greater than 12 weeks to include NSAID's and/or analgesics, corticosteriod injections and activity modification.  Onset of symptoms was gradual, starting 2 years ago with gradually worsening course since that time.  Patient currently rates pain at 10 out of 10 with activity. Patient has night pain, worsening of pain with activity and weight bearing, pain that interferes with activities of daily living and pain with passive range of motion.  Patient has evidence of subchondral cysts, subchondral sclerosis, periarticular osteophytes and joint space narrowing by imaging studies. There is no active infection.  Past Medical History: Depression, thyroidectomy, R TKA, R THA, Cervical fusion, and L-spine surgery  Past Surgical History: Thyroidectomy 1984, R TKA 2001, R THA 2007, Cervical fusion 1993, L-spine surgery 1998, Hysterectomy 1989, Bowel obstruction w/ infxn 1995, [place] sx 2007.  Allergies  Allergen Reactions  . Sulfonamide Derivatives    Medications: Levothyroxine 75 mcg daily Bisoprolol / HCT 2.5/6.25 daily Diclofenac 75 mg BID Hydroxychlor 200 mg BID Duloxetine 30 mg daily  Social History: Widowed, never smoker. No etoh.  Family History: Mother: CV dz, DM. Father: MI.  Grandparents and child w/ Cancer.  ROS: Currently denies lightheadedness, dizziness, Fever, chills, CP, SOB.  No personal history of DVT, PE, MI, or CVA. No loose  teeth or dentures Occasional tinnitus, constipation. All other systems have been reviewed and were otherwise currently negative with the exception of those mentioned in the HPI and as above.  Objective: Vitals: Ht: 5'2" Wt: 189 Temp: 98.1 BP: 149/98 Pulse: 82 O2 96% on room air. Physical Exam: General: Alert, NAD.  Antalgic Gait.  Walker occasionally. HEENT: EOMI, Good Neck Extension  Pulm: No increased work of breathing.  Clear B/L A/P w/o crackle or wheeze.  CV: RRR, No m/g/r appreciated  GI: soft, NT, ND Neuro: Neuro without gross focal deficit.  Sensation intact distally Skin: No lesions in the area of chief complaint MSK/Surgical Site: Left knee w/o redness or sign of infection.  Mild effusion.  + JLT. ROM 0-115.  5/5 strength in extension and flexion.  +EHL/FHL.  NVI.  Stable Lachman's and varus and valgus stress.    Imaging Review Plain radiographs demonstrate severe degenerative joint disease of the left knee.   Assessment: OA LEFT KNEE Principal Problem:   Primary osteoarthritis of left knee Active Problems:   Depression   S/P thyroidectomy   S/P cervical spinal fusion   S/P total knee arthroplasty, right  Plan: Plan for Procedure(s): LEFT TOTAL KNEE ARTHROPLASTY  The patient history, physical exam, clinical judgement of the provider and imaging are consistent with end stage degenerative joint disease and total joint arthroplasty is deemed medically necessary. The treatment options including medical management, injection therapy, and arthroplasty were discussed at length. The risks and benefits of Procedure(s): LEFT TOTAL KNEE ARTHROPLASTY were presented and reviewed.  The risks of nonoperative treatment, versus surgical intervention including but not limited to continued pain, aseptic loosening, stiffness, dislocation/subluxation, infection, bleeding, nerve injury, blood  clots, cardiopulmonary complications, morbidity, mortality, among others were discussed. The patient  verbalizes understanding and wishes to proceed with the plan.  Patient is being admitted for inpatient treatment for surgery, pain control, PT, OT, prophylactic antibiotics, VTE prophylaxis, progressive ambulation, ADL's and discharge planning.   Dental prophylaxis discussed and recommended for 2 years postoperatively.  The patient does meet the criteria for TXA which will be used perioperatively via IV.   ASA 325 mg will be used postoperatively for DVT prophylaxis in addition to SCDs, and early ambulation. The patient is planning to be discharged home with home health services ([facility]) in care of her niece who lives next door and 7 grandchildren.  Prudencio Burly III, PA-C 07/31/2016 10:49 AM

## 2016-07-31 DIAGNOSIS — Z981 Arthrodesis status: Secondary | ICD-10-CM

## 2016-07-31 DIAGNOSIS — E890 Postprocedural hypothyroidism: Secondary | ICD-10-CM

## 2016-07-31 DIAGNOSIS — F32A Depression, unspecified: Secondary | ICD-10-CM

## 2016-07-31 DIAGNOSIS — Z9889 Other specified postprocedural states: Secondary | ICD-10-CM

## 2016-07-31 DIAGNOSIS — M1712 Unilateral primary osteoarthritis, left knee: Secondary | ICD-10-CM

## 2016-07-31 DIAGNOSIS — F329 Major depressive disorder, single episode, unspecified: Secondary | ICD-10-CM

## 2016-07-31 DIAGNOSIS — Z96651 Presence of right artificial knee joint: Secondary | ICD-10-CM

## 2016-08-08 NOTE — Pre-Procedure Instructions (Addendum)
[name]  08/08/2016      [facility] - [place], [place] - [address] [place] [address] [place] [address] Phone: [phone] Fax: [phone]    Your procedure is scheduled on Tues. March 20  Report to [facility] Admitting at 8:30 A.M.  Call this number if you have problems the morning of surgery:  [phone]   Remember:  Do not eat food or drink liquids after midnight on Mon. March 19   Take these medicines the morning of surgery with A SIP OF WATER : cymbalta,  Levothyroxine (synthroid),               1 week prior to surgery stop:aspirin, advil, motrin, aleve, goody's, Bc Powders, Bayer Back and Body, diclofenac(voltaren), vitamins/herbal medicines.   Do not wear jewelry, make-up or nail polish.  Do not wear lotions, powders, or perfumes, or deoderant.  Do not shave 48 hours prior to surgery.  Men may shave face and neck.  Do not bring valuables to the hospital.  [facility] is not responsible for any belongings or valuables.  Contacts, dentures or bridgework may not be worn into surgery.  Leave your suitcase in the car.  After surgery it may be brought to your room.  For patients admitted to the hospital, discharge time will be determined by your treatment team.    Special instructions:   [facility]- Preparing For Surgery  Before surgery, you can play an important role. Because skin is not sterile, your skin needs to be as free of germs as possible. You can reduce the number of germs on your skin by washing with CHG (chlorahexidine gluconate) Soap before surgery.  CHG is an antiseptic cleaner which kills germs and bonds with the skin to continue killing germs even after washing.  Please do not use if you have an allergy to CHG or antibacterial soaps. If your skin becomes reddened/irritated stop using the CHG.  Do not shave (including legs and underarms) for at least 48 hours prior to first CHG shower. It is OK to shave your  face.  Please follow these instructions carefully.   1. Shower the NIGHT BEFORE SURGERY and the MORNING OF SURGERY with CHG.   2. If you chose to wash your hair, wash your hair first as usual with your normal shampoo.  3. After you shampoo, rinse your hair and body thoroughly to remove the shampoo.  4. Use CHG as you would any other liquid soap. You can apply CHG directly to the skin and wash gently with a scrungie or a clean washcloth.   5. Apply the CHG Soap to your body ONLY FROM THE NECK DOWN.  Do not use on open wounds or open sores. Avoid contact with your eyes, ears, mouth and genitals (private parts). Wash genitals (private parts) with your normal soap.  6. Wash thoroughly, paying special attention to the area where your surgery will be performed.  7. Thoroughly rinse your body with warm water from the neck down.  8. DO NOT shower/wash with your normal soap after using and rinsing off the CHG Soap.  9. Pat yourself dry with a CLEAN TOWEL.   10. Wear CLEAN PAJAMAS   11. Place CLEAN SHEETS on your bed the night of your first shower and DO NOT SLEEP WITH PETS.    Day of Surgery: Do not apply any deodorants/lotions. Please wear clean clothes to the hospital/surgery center.  Please read over the following fact sheets that you were given. Coughing and Deep Breathing, Total Joint Packet and MRSA Information

## 2016-08-09 ENCOUNTER — Encounter (HOSPITAL_COMMUNITY)
Admission: RE | Admit: 2016-08-09 | Discharge: 2016-08-09 | Disposition: A | Discharge status: Home / Self Care | Payer: PPO | Source: Ambulatory Visit | Attending: Orthopedic Surgery | Admitting: Orthopedic Surgery

## 2016-08-09 ENCOUNTER — Encounter (HOSPITAL_COMMUNITY): Payer: Self-pay

## 2016-08-09 DIAGNOSIS — Z981 Arthrodesis status: Secondary | ICD-10-CM | POA: Insufficient documentation

## 2016-08-09 DIAGNOSIS — Z01812 Encounter for preprocedural laboratory examination: Secondary | ICD-10-CM | POA: Diagnosis not present

## 2016-08-09 DIAGNOSIS — M1712 Unilateral primary osteoarthritis, left knee: Secondary | ICD-10-CM | POA: Insufficient documentation

## 2016-08-09 DIAGNOSIS — I10 Essential (primary) hypertension: Secondary | ICD-10-CM | POA: Diagnosis not present

## 2016-08-09 DIAGNOSIS — E890 Postprocedural hypothyroidism: Secondary | ICD-10-CM | POA: Insufficient documentation

## 2016-08-09 DIAGNOSIS — K219 Gastro-esophageal reflux disease without esophagitis: Secondary | ICD-10-CM | POA: Insufficient documentation

## 2016-08-09 HISTORY — DX: Unspecified osteoarthritis, unspecified site: M19.90

## 2016-08-09 HISTORY — DX: Essential (primary) hypertension: I10

## 2016-08-09 HISTORY — DX: Unspecified staphylococcus as the cause of diseases classified elsewhere: B95.8

## 2016-08-09 HISTORY — DX: Other complications of anesthesia, initial encounter: T88.59XA

## 2016-08-09 HISTORY — DX: Gastro-esophageal reflux disease without esophagitis: K21.9

## 2016-08-09 HISTORY — DX: Adverse effect of unspecified anesthetic, initial encounter: T41.45XA

## 2016-08-09 HISTORY — DX: Other specified postprocedural states: Z98.890

## 2016-08-09 HISTORY — DX: Hypothyroidism, unspecified: E03.9

## 2016-08-09 HISTORY — DX: Other specified postprocedural states: R11.2

## 2016-08-09 LAB — BASIC METABOLIC PANEL
ANION GAP: 8 (ref 5–15)
BUN: 6 mg/dL (ref 6–20)
CHLORIDE: 97 mmol/L — AB (ref 101–111)
CO2: 27 mmol/L (ref 22–32)
Calcium: 9.3 mg/dL (ref 8.9–10.3)
Creatinine, Ser: 0.71 mg/dL (ref 0.44–1.00)
GFR calc non Af Amer: >60 mL/min (ref >60)
Glucose, Bld: 91 mg/dL (ref 65–99)
Potassium: 3.9 mmol/L (ref 3.5–5.1)
Sodium: 132 mmol/L — ABNORMAL LOW (ref 135–145)

## 2016-08-09 LAB — CBC
HCT: 39.7 % (ref 36.0–46.0)
HEMOGLOBIN: 13.9 g/dL (ref 12.0–15.0)
MCH: 31.2 pg (ref 26.0–34.0)
MCHC: 35 g/dL (ref 30.0–36.0)
MCV: 89 fL (ref 78.0–100.0)
Platelets: 223 10*3/uL (ref 150–400)
RBC: 4.46 MIL/uL (ref 3.87–5.11)
RDW: 12.2 % (ref 11.5–15.5)
WBC: 3.1 10*3/uL — ABNORMAL LOW (ref 4.0–10.5)

## 2016-08-09 LAB — SURGICAL PCR SCREEN
MRSA, PCR: NEGATIVE
Staphylococcus aureus: NEGATIVE

## 2016-08-09 NOTE — Progress Notes (Signed)
PCP:Dr. Casimer Leek in [place] request ekg.  Pt. Has red sportic rash on hands/fingers and right upper arm. States it itches and it appeared 1 week ago.Pt. thinls it coming from [place] but isn't sure.  Notified [employer], [place]. Instructed pt. To se PCP and to inform Dr. Alain Marion. Pt. Stated she would.

## 2016-08-10 ENCOUNTER — Ambulatory Visit (INDEPENDENT_AMBULATORY_CARE_PROVIDER_SITE_OTHER): Payer: PPO | Admitting: Neurology

## 2016-08-10 ENCOUNTER — Encounter (INDEPENDENT_AMBULATORY_CARE_PROVIDER_SITE_OTHER): Payer: Self-pay

## 2016-08-10 DIAGNOSIS — Z981 Arthrodesis status: Secondary | ICD-10-CM

## 2016-08-10 DIAGNOSIS — R202 Paresthesia of skin: Secondary | ICD-10-CM | POA: Diagnosis not present

## 2016-08-10 DIAGNOSIS — Z0289 Encounter for other administrative examinations: Secondary | ICD-10-CM

## 2016-08-10 DIAGNOSIS — R531 Weakness: Secondary | ICD-10-CM | POA: Insufficient documentation

## 2016-08-10 NOTE — Progress Notes (Signed)
Anesthesia Chart Review:  Pt is a 71 year old female scheduled for L total knee arthroplasty on 08/21/2016 with Edmonia Lynch, M.D.  - PCP is Delphina Cahill, MD  PMH includes: HTN, hypothyroidism, post-op N/V, GERD. Never smoker. BMI 36.  Anesthesia history includes: Patient reports she "holds her breath when waking up"  Medications include bisoprolol-HCTZ, Plaquenil, levothyroxine  Preoperative labs reviewed.    EKG requested from PCP's office. If it does not arrive in time, obtain EKG DOS.   Pt reported rash on hands and RUE x1 week.  PAT RN instructed pt to see PCP and notify Dr. Debroah Loop office.   If rash resolved and EKG acceptable, I anticipate pt can proceed as scheduled.   Willeen Cass, FNP-BC [facility]/Anesthesiology Phone: [phone] 08/10/2016 1:03 PM

## 2016-08-10 NOTE — Procedures (Signed)
Full Name: [name] Gender: Female MRN #: [id number] Date of Birth: 1945/11/28    Visit Date: 08/10/2016 09:54 Age: 71 Years 41 Months Old Examining Physician: Marcial Pacas, MD  Referring Physician: Percell Miller, MD History: 71 year old right-handed female, with history of right cervical decompression in 1990s for right cervical radiculopathy, presented with a year history of right 5th, ulnar half of right 4th finger paresthesia, significant right hand muscle weakness, atrophy, right elbow discomfort, she denies neck pain.   On examination:  Significant right hand intrinsic muscle atrophy, there was no weakness at right proximal arm and wrist muscles. Right finger abduction 3, finger flexion ulnar half 4, mild right wrist flexion weakness. She has significant right elbow Tinel sign.  She has with light touch and pinprick involving right fifth, ulnar half of fourth fingers, extending to ulnar half of right palm and right dorsum hand.  Summary of the test: Nerve conduction study: Right ulnar sensory responses were absent. Right ulnar to ADM and FDI motor responses were absent.   Left ulnar sensory and motor responses were within normal limits.  Bilateral median sensory responses showed mildly prolonged peak latency, with preserved map amplitude.  Bilateral median motor responses were within normal limits. Bilateral ulnar sensory responses were normal.  Electromyography: Selected needle examination was performed at right upper extremity, right cervical paraspinal muscles.  There is evidence of active neuropathic changes involving right first dorsal interossei, abductor digiti minimi, mild neuropathic changes involving right flexor carpi ulnaris. There was no evidence of spontaneous activity at right cervical paraspinal muscles.  Conclusion: This is an abnormal study. There is electrodiagnostic evidence of active axonal right ulnar neuropathy, before the takeoff branch to the right  flexor carpi ulnaris, This will localize the lesion to right elbow.    ------------------------------- Marcial Pacas,  M.D.  [facility] [place], [place] [address] Tel: [phone] Fax: [phone]        [facility]    Nerve / Sites Rec. Site Peak Lat Ref.  Amp Ref. Segments Distance Peak Diff Ref.    ms ms V V  cm ms ms  R Ulnar - Orthodromic, (Dig V, Mid palm)     Dig V Wrist NR ?3.1 NR ?5 Dig V - Wrist 11    R Median - Orthodromic (Dig II, Mid palm)     Dig II Wrist 3.8 ?3.4 13 ?10 Dig II - Wrist 13    R Radial - Anatomical snuff box (Forearm)     Forearm Wrist 2.7 ?2.9 7 ?15 Forearm - Wrist 10    L Radial - Anatomical snuff box (Forearm)     Forearm Wrist 2.5 ?2.9 14 ?15 Forearm - Wrist 10    L Median - Orthodromic (Dig II, Mid palm)     Dig II Wrist 3.8 ?3.4 7 ?10 Dig II - Wrist 13    L Median, Ulnar - Transcarpal comparison     Median Palm Wrist 2.6 ?2.2 21 ?40 Median Palm - Wrist 8       Ulnar Palm Wrist 2.1 ?2.2 18 ?12 Ulnar Palm - Wrist 8          Median Palm - Ulnar Palm  0.5 ?0.4  L Ulnar - Orthodromic, (Dig V, Mid palm)     Dig V Wrist 2.6 ?3.1 9 ?5 Dig V - Wrist 11  [place]    Nerve / Sites Muscle Latency Ref. Amplitude Ref. Rel Amp Segments Distance Lat Diff Velocity Ref. Area    ms ms mV mV %  cm ms m/s m/s mVms  R Median - APB     Wrist APB 4.2 ?4.4 6.8 ?4.0 100 Wrist - APB 7    25.3     Upper arm APB 7.9  6.7  98.6 Upper arm - Wrist 20 3.7 54  25.1  R Ulnar - ADM     Wrist ADM NR ?3.3 NR ?6.0 NR Wrist - ADM 7    NR     B.Elbow ADM NR  NR  NR B.Elbow - Wrist  NR  ?49 NR         A.Elbow - Wrist  NR     R Ulnar - FDI     Wrist FDI NR ?4.5 NR ?7.0 NR Wrist - FDI 8    NR     B.Elbow FDI NR  NR  NR B.Elbow - Wrist  NR  ?49 NR         A.Elbow - Wrist  NR     L Median - APB     Wrist APB 4.4 ?4.4 6.5 ?4.0 100 Wrist - APB 7    26.1     Upper arm APB 8.3  6.2  95.3 Upper arm - Wrist 20 3.9 51  25.1  L Ulnar - ADM      Wrist ADM 2.8 ?3.3 6.8 ?6.0 100 Wrist - ADM 7    25.5     B.Elbow ADM 6.2  5.6  82.6 B.Elbow - Wrist 17 3.4 50 ?49 20.7     A.Elbow ADM 8.2  5.5  98.6 A.Elbow - B.Elbow 10 2.0 49 ?49 20.1         A.Elbow - Wrist  5.4                  F  Wave    Nerve F Lat Ref.   ms ms  R Median - APB 27.1 ?31.0  L Median - APB 27.6 ?31.0  L Ulnar - ADM 27.6 ?32.0           EMG full       EMG Summary Table    Spontaneous MUAP Recruitment  Muscle IA Fib PSW Fasc Other Amp Dur. Poly Pattern  R. First dorsal interosseous Increased 1+ None None _______ Increased Increased 1+   R. Abductor digiti minimi (manus) Increased 1+ 1+ None _______ Increased Increased 1+ Reduced  R. Pronator teres Normal None None None _______ Normal Normal Normal Normal  R. Deltoid Normal None None None _______ Normal Normal Normal Normal  R. Biceps brachii Normal None None None _______ Normal Normal Normal Normal  R. Flexor carpi ulnaris Increased None None None _______ Increased Increased Normal Reduced  R. Extensor digitorum communis Normal None None None _______ Normal Normal Normal Normal  R. Cervical paraspinals Normal None None None _______ Normal Normal Normal Normal

## 2016-08-14 DIAGNOSIS — L301 Dyshidrosis [pompholyx]: Secondary | ICD-10-CM | POA: Diagnosis not present

## 2016-08-20 MED ORDER — TRANEXAMIC ACID 1000 MG/10ML IV SOLN
1000.0000 mg | INTRAVENOUS | Status: AC
  Administered 2016-08-21: 1000 mg via INTRAVENOUS
  Filled 2016-08-20: qty 10

## 2016-08-21 ENCOUNTER — Inpatient Hospital Stay (HOSPITAL_COMMUNITY): Payer: PPO | Admitting: Certified Registered Nurse Anesthetist

## 2016-08-21 ENCOUNTER — Inpatient Hospital Stay (HOSPITAL_COMMUNITY): Payer: PPO

## 2016-08-21 ENCOUNTER — Encounter (HOSPITAL_COMMUNITY)
Admission: RE | Disposition: A | Discharge status: Home / Self Care | Payer: Self-pay | Source: Ambulatory Visit | Attending: Orthopedic Surgery

## 2016-08-21 ENCOUNTER — Inpatient Hospital Stay (HOSPITAL_COMMUNITY): Payer: PPO | Admitting: Vascular Surgery

## 2016-08-21 ENCOUNTER — Encounter (HOSPITAL_COMMUNITY): Payer: Self-pay | Admitting: Certified Registered Nurse Anesthetist

## 2016-08-21 ENCOUNTER — Inpatient Hospital Stay (HOSPITAL_COMMUNITY)
Admission: RE | Admit: 2016-08-21 | Discharge: 2016-08-22 | DRG: 470 | Disposition: A | Discharge status: Home / Self Care | Payer: PPO | Source: Ambulatory Visit | Attending: Orthopedic Surgery | Admitting: Orthopedic Surgery

## 2016-08-21 DIAGNOSIS — E039 Hypothyroidism, unspecified: Secondary | ICD-10-CM | POA: Diagnosis not present

## 2016-08-21 DIAGNOSIS — K219 Gastro-esophageal reflux disease without esophagitis: Secondary | ICD-10-CM | POA: Diagnosis not present

## 2016-08-21 DIAGNOSIS — Z96652 Presence of left artificial knee joint: Secondary | ICD-10-CM | POA: Diagnosis not present

## 2016-08-21 DIAGNOSIS — M25762 Osteophyte, left knee: Secondary | ICD-10-CM | POA: Diagnosis not present

## 2016-08-21 DIAGNOSIS — M1712 Unilateral primary osteoarthritis, left knee: Principal | ICD-10-CM | POA: Diagnosis present

## 2016-08-21 DIAGNOSIS — Z9889 Other specified postprocedural states: Secondary | ICD-10-CM

## 2016-08-21 DIAGNOSIS — F32A Depression, unspecified: Secondary | ICD-10-CM

## 2016-08-21 DIAGNOSIS — Z8249 Family history of ischemic heart disease and other diseases of the circulatory system: Secondary | ICD-10-CM

## 2016-08-21 DIAGNOSIS — Z882 Allergy status to sulfonamides status: Secondary | ICD-10-CM

## 2016-08-21 DIAGNOSIS — Z9089 Acquired absence of other organs: Secondary | ICD-10-CM

## 2016-08-21 DIAGNOSIS — E890 Postprocedural hypothyroidism: Secondary | ICD-10-CM | POA: Diagnosis not present

## 2016-08-21 DIAGNOSIS — Z79899 Other long term (current) drug therapy: Secondary | ICD-10-CM | POA: Diagnosis not present

## 2016-08-21 DIAGNOSIS — Z471 Aftercare following joint replacement surgery: Secondary | ICD-10-CM | POA: Diagnosis not present

## 2016-08-21 DIAGNOSIS — K5900 Constipation, unspecified: Secondary | ICD-10-CM | POA: Diagnosis present

## 2016-08-21 DIAGNOSIS — F329 Major depressive disorder, single episode, unspecified: Secondary | ICD-10-CM | POA: Diagnosis not present

## 2016-08-21 DIAGNOSIS — Z981 Arthrodesis status: Secondary | ICD-10-CM | POA: Diagnosis not present

## 2016-08-21 DIAGNOSIS — G8918 Other acute postprocedural pain: Secondary | ICD-10-CM | POA: Diagnosis not present

## 2016-08-21 DIAGNOSIS — Z90710 Acquired absence of both cervix and uterus: Secondary | ICD-10-CM

## 2016-08-21 DIAGNOSIS — I10 Essential (primary) hypertension: Secondary | ICD-10-CM | POA: Diagnosis present

## 2016-08-21 DIAGNOSIS — Z96651 Presence of right artificial knee joint: Secondary | ICD-10-CM

## 2016-08-21 DIAGNOSIS — Z833 Family history of diabetes mellitus: Secondary | ICD-10-CM

## 2016-08-21 DIAGNOSIS — Z809 Family history of malignant neoplasm, unspecified: Secondary | ICD-10-CM | POA: Diagnosis not present

## 2016-08-21 HISTORY — PX: TOTAL KNEE ARTHROPLASTY: SHX125

## 2016-08-21 SURGERY — ARTHROPLASTY, KNEE, TOTAL
Anesthesia: General | Site: Knee | Laterality: Left

## 2016-08-21 MED ORDER — PROPOFOL 10 MG/ML IV BOLUS
INTRAVENOUS | Status: DC | PRN
  Administered 2016-08-21: 50 mg via INTRAVENOUS
  Administered 2016-08-21: 150 mg via INTRAVENOUS

## 2016-08-21 MED ORDER — ONDANSETRON HCL 4 MG PO TABS: 4.0000 mg | ORAL_TABLET | Freq: Four times a day (QID) | ORAL | Status: DC | PRN

## 2016-08-21 MED ORDER — ACETAMINOPHEN 325 MG PO TABS
650.0000 mg | ORAL_TABLET | Freq: Four times a day (QID) | ORAL | Status: AC
  Administered 2016-08-22 (×3): 650 mg via ORAL
  Filled 2016-08-21 (×4): qty 2

## 2016-08-21 MED ORDER — MIDAZOLAM HCL 2 MG/2ML IJ SOLN
INTRAMUSCULAR | Status: AC
  Filled 2016-08-21: qty 2

## 2016-08-21 MED ORDER — PROPOFOL 10 MG/ML IV BOLUS
INTRAVENOUS | Status: AC
  Filled 2016-08-21: qty 20

## 2016-08-21 MED ORDER — ONDANSETRON HCL 4 MG/2ML IJ SOLN
INTRAMUSCULAR | Status: DC | PRN
  Administered 2016-08-21: 4 mg via INTRAVENOUS

## 2016-08-21 MED ORDER — ACETAMINOPHEN 650 MG RE SUPP: 650.0000 mg | Freq: Four times a day (QID) | RECTAL | Status: DC | PRN

## 2016-08-21 MED ORDER — SODIUM CHLORIDE 0.9 % IR SOLN
Status: DC | PRN
  Administered 2016-08-21: 3000 mL

## 2016-08-21 MED ORDER — DOCUSATE SODIUM 100 MG PO CAPS: 100.0000 mg | ORAL_CAPSULE | Freq: Two times a day (BID) | ORAL | 0 refills | Status: DC

## 2016-08-21 MED ORDER — KETOROLAC TROMETHAMINE 15 MG/ML IJ SOLN
INTRAMUSCULAR | Status: AC
  Filled 2016-08-21: qty 1

## 2016-08-21 MED ORDER — CHLORHEXIDINE GLUCONATE 4 % EX LIQD: 60.0000 mL | Freq: Once | CUTANEOUS | Status: DC

## 2016-08-21 MED ORDER — OMEPRAZOLE 20 MG PO CPDR: 20.0000 mg | DELAYED_RELEASE_CAPSULE | Freq: Every day | ORAL | 2 refills | Status: DC

## 2016-08-21 MED ORDER — METHOCARBAMOL 1000 MG/10ML IJ SOLN
500.0000 mg | Freq: Four times a day (QID) | INTRAVENOUS | Status: DC | PRN
  Filled 2016-08-21: qty 5

## 2016-08-21 MED ORDER — MENTHOL 3 MG MT LOZG
1.0000 | LOZENGE | OROMUCOSAL | Status: DC | PRN
  Administered 2016-08-22: 3 mg via ORAL
  Filled 2016-08-21: qty 9

## 2016-08-21 MED ORDER — ACETAMINOPHEN 500 MG PO TABS
ORAL_TABLET | ORAL | Status: AC
  Administered 2016-08-21: 1000 mg
  Filled 2016-08-21: qty 2

## 2016-08-21 MED ORDER — DEXAMETHASONE SODIUM PHOSPHATE 10 MG/ML IJ SOLN
10.0000 mg | Freq: Once | INTRAMUSCULAR | Status: AC
  Administered 2016-08-22: 10 mg via INTRAVENOUS
  Filled 2016-08-21: qty 1

## 2016-08-21 MED ORDER — DEXAMETHASONE SODIUM PHOSPHATE 4 MG/ML IJ SOLN
INTRAMUSCULAR | Status: DC | PRN
  Administered 2016-08-21: 10 mg via INTRAVENOUS

## 2016-08-21 MED ORDER — LABETALOL HCL 5 MG/ML IV SOLN
INTRAVENOUS | Status: AC
  Filled 2016-08-21: qty 4

## 2016-08-21 MED ORDER — PHENOL 1.4 % MT LIQD
1.0000 | OROMUCOSAL | Status: DC | PRN
Start: 2016-08-21 — End: 2016-08-22

## 2016-08-21 MED ORDER — CEFAZOLIN SODIUM-DEXTROSE 2-4 GM/100ML-% IV SOLN
2.0000 g | INTRAVENOUS | Status: AC
  Administered 2016-08-21: 2 g via INTRAVENOUS

## 2016-08-21 MED ORDER — LIDOCAINE HCL (CARDIAC) 20 MG/ML IV SOLN
INTRAVENOUS | Status: DC | PRN
  Administered 2016-08-21: 80 mg via INTRAVENOUS

## 2016-08-21 MED ORDER — GABAPENTIN 300 MG PO CAPS
300.0000 mg | ORAL_CAPSULE | Freq: Once | ORAL | Status: AC
  Administered 2016-08-21: 300 mg via ORAL

## 2016-08-21 MED ORDER — FENTANYL CITRATE (PF) 100 MCG/2ML IJ SOLN
INTRAMUSCULAR | Status: AC
  Administered 2016-08-21: 75 ug
  Filled 2016-08-21: qty 2

## 2016-08-21 MED ORDER — DIPHENHYDRAMINE HCL 12.5 MG/5ML PO ELIX: 12.5000 mg | ORAL_SOLUTION | ORAL | Status: DC | PRN

## 2016-08-21 MED ORDER — ASPIRIN EC 325 MG PO TBEC: 325.0000 mg | DELAYED_RELEASE_TABLET | Freq: Every day | ORAL | 0 refills | Status: DC

## 2016-08-21 MED ORDER — KETOROLAC TROMETHAMINE 30 MG/ML IJ SOLN
INTRAMUSCULAR | Status: DC | PRN
Start: 2016-08-21 — End: 2016-08-21
  Administered 2016-08-21: 30 mg

## 2016-08-21 MED ORDER — POLYETHYLENE GLYCOL 3350 17 G PO PACK: 17.0000 g | PACK | Freq: Every day | ORAL | Status: DC | PRN

## 2016-08-21 MED ORDER — METHOCARBAMOL 500 MG PO TABS
500.0000 mg | ORAL_TABLET | Freq: Four times a day (QID) | ORAL | Status: DC | PRN
  Administered 2016-08-22: 500 mg via ORAL
  Filled 2016-08-21: qty 1

## 2016-08-21 MED ORDER — DULOXETINE HCL 30 MG PO CPEP
30.0000 mg | ORAL_CAPSULE | Freq: Every day | ORAL | Status: DC
  Administered 2016-08-22: 30 mg via ORAL
  Filled 2016-08-21: qty 1

## 2016-08-21 MED ORDER — BACLOFEN 10 MG PO TABS: 10.0000 mg | ORAL_TABLET | Freq: Three times a day (TID) | ORAL | 0 refills | Status: DC | PRN

## 2016-08-21 MED ORDER — LACTATED RINGERS IV SOLN: INTRAVENOUS | Status: DC

## 2016-08-21 MED ORDER — LACTATED RINGERS IV SOLN
INTRAVENOUS | Status: DC
  Administered 2016-08-21 (×2): via INTRAVENOUS

## 2016-08-21 MED ORDER — ONDANSETRON HCL 4 MG/2ML IJ SOLN: 4.0000 mg | Freq: Four times a day (QID) | INTRAMUSCULAR | Status: DC | PRN

## 2016-08-21 MED ORDER — BUPIVACAINE HCL (PF) 0.25 % IJ SOLN
INTRAMUSCULAR | Status: AC
  Filled 2016-08-21: qty 10

## 2016-08-21 MED ORDER — BISOPROLOL-HYDROCHLOROTHIAZIDE 2.5-6.25 MG PO TABS
1.0000 | ORAL_TABLET | Freq: Every day | ORAL | Status: DC
  Administered 2016-08-21: 1 via ORAL
  Filled 2016-08-21: qty 1

## 2016-08-21 MED ORDER — SODIUM CHLORIDE FLUSH 0.9 % IV SOLN
INTRAVENOUS | Status: DC | PRN
  Administered 2016-08-21: 20 mL

## 2016-08-21 MED ORDER — ONDANSETRON HCL 4 MG PO TABS: 4.0000 mg | ORAL_TABLET | Freq: Three times a day (TID) | ORAL | 0 refills | Status: DC | PRN

## 2016-08-21 MED ORDER — MORPHINE SULFATE (PF) 2 MG/ML IV SOLN: 2.0000 mg | INTRAVENOUS | Status: DC | PRN

## 2016-08-21 MED ORDER — ACETAMINOPHEN 500 MG PO TABS
1000.0000 mg | ORAL_TABLET | Freq: Once | ORAL | Status: AC
  Administered 2016-08-21: 1000 mg via ORAL

## 2016-08-21 MED ORDER — FENTANYL CITRATE (PF) 100 MCG/2ML IJ SOLN
INTRAMUSCULAR | Status: AC
  Filled 2016-08-21: qty 4

## 2016-08-21 MED ORDER — KETOROLAC TROMETHAMINE 15 MG/ML IJ SOLN
7.5000 mg | Freq: Four times a day (QID) | INTRAMUSCULAR | Status: AC
Start: 2016-08-21 — End: 2016-08-22
  Administered 2016-08-21 – 2016-08-22 (×4): 7.5 mg via INTRAVENOUS
  Filled 2016-08-21 (×4): qty 1

## 2016-08-21 MED ORDER — 0.9 % SODIUM CHLORIDE (POUR BTL) OPTIME
TOPICAL | Status: DC | PRN
  Administered 2016-08-21: 1000 mL

## 2016-08-21 MED ORDER — OXYCODONE HCL 5 MG PO TABS
5.0000 mg | ORAL_TABLET | ORAL | Status: DC | PRN
  Administered 2016-08-22 (×2): 10 mg via ORAL
  Filled 2016-08-21 (×2): qty 2

## 2016-08-21 MED ORDER — DOCUSATE SODIUM 100 MG PO CAPS
100.0000 mg | ORAL_CAPSULE | Freq: Two times a day (BID) | ORAL | Status: DC
  Administered 2016-08-21 – 2016-08-22 (×2): 100 mg via ORAL
  Filled 2016-08-21 (×2): qty 1

## 2016-08-21 MED ORDER — BUPIVACAINE HCL (PF) 0.25 % IJ SOLN
INTRAMUSCULAR | Status: AC
  Filled 2016-08-21: qty 30

## 2016-08-21 MED ORDER — SUCCINYLCHOLINE CHLORIDE 20 MG/ML IJ SOLN
INTRAMUSCULAR | Status: DC | PRN
  Administered 2016-08-21: 100 mg via INTRAVENOUS

## 2016-08-21 MED ORDER — ACETAMINOPHEN 325 MG PO TABS
650.0000 mg | ORAL_TABLET | Freq: Four times a day (QID) | ORAL | Status: DC | PRN
  Administered 2016-08-21: 650 mg via ORAL

## 2016-08-21 MED ORDER — METOCLOPRAMIDE HCL 5 MG/ML IJ SOLN: 5.0000 mg | Freq: Three times a day (TID) | INTRAMUSCULAR | Status: DC | PRN

## 2016-08-21 MED ORDER — MIDAZOLAM HCL 5 MG/5ML IJ SOLN
INTRAMUSCULAR | Status: DC | PRN
  Administered 2016-08-21: 2 mg via INTRAVENOUS

## 2016-08-21 MED ORDER — LEVOTHYROXINE SODIUM 75 MCG PO TABS
75.0000 ug | ORAL_TABLET | Freq: Every day | ORAL | Status: DC
  Administered 2016-08-22: 75 ug via ORAL
  Filled 2016-08-21: qty 1

## 2016-08-21 MED ORDER — CEFAZOLIN SODIUM-DEXTROSE 2-4 GM/100ML-% IV SOLN
INTRAVENOUS | Status: AC
  Filled 2016-08-21: qty 100

## 2016-08-21 MED ORDER — FENTANYL CITRATE (PF) 100 MCG/2ML IJ SOLN
INTRAMUSCULAR | Status: DC | PRN
  Administered 2016-08-21: 25 ug via INTRAVENOUS
  Administered 2016-08-21: 75 ug via INTRAVENOUS
  Administered 2016-08-21: 125 ug via INTRAVENOUS
  Administered 2016-08-21: 25 ug via INTRAVENOUS
  Administered 2016-08-21: 100 ug via INTRAVENOUS

## 2016-08-21 MED ORDER — FENTANYL CITRATE (PF) 100 MCG/2ML IJ SOLN
INTRAMUSCULAR | Status: AC
  Filled 2016-08-21: qty 2

## 2016-08-21 MED ORDER — OXYCODONE HCL 5 MG PO TABS: 5.0000 mg | ORAL_TABLET | ORAL | Status: DC | PRN

## 2016-08-21 MED ORDER — METOPROLOL TARTRATE 5 MG/5ML IV SOLN
INTRAVENOUS | Status: DC | PRN
  Administered 2016-08-21: 5 mg via INTRAVENOUS

## 2016-08-21 MED ORDER — METOCLOPRAMIDE HCL 5 MG PO TABS: 5.0000 mg | ORAL_TABLET | Freq: Three times a day (TID) | ORAL | Status: DC | PRN

## 2016-08-21 MED ORDER — OXYCODONE-ACETAMINOPHEN 5-325 MG PO TABS: 1.0000 | ORAL_TABLET | ORAL | 0 refills | Status: DC | PRN

## 2016-08-21 MED ORDER — GABAPENTIN 300 MG PO CAPS
ORAL_CAPSULE | ORAL | Status: AC
  Administered 2016-08-21: 300 mg
  Filled 2016-08-21: qty 1

## 2016-08-21 MED ORDER — BUPIVACAINE HCL (PF) 0.25 % IJ SOLN
INTRAMUSCULAR | Status: DC | PRN
  Administered 2016-08-21: 30 mL

## 2016-08-21 MED ORDER — HYDROMORPHONE HCL 1 MG/ML IJ SOLN: 0.2500 mg | INTRAMUSCULAR | Status: DC | PRN

## 2016-08-21 MED ORDER — CEFAZOLIN SODIUM-DEXTROSE 2-4 GM/100ML-% IV SOLN
2.0000 g | Freq: Four times a day (QID) | INTRAVENOUS | Status: AC
  Administered 2016-08-21 – 2016-08-22 (×2): 2 g via INTRAVENOUS
  Filled 2016-08-21 (×2): qty 100

## 2016-08-21 MED ORDER — ACETAMINOPHEN 325 MG PO TABS: 650.0000 mg | ORAL_TABLET | Freq: Four times a day (QID) | ORAL | Status: DC

## 2016-08-21 MED ORDER — SENNA 8.6 MG PO TABS
1.0000 | ORAL_TABLET | Freq: Two times a day (BID) | ORAL | Status: DC
Start: 2016-08-21 — End: 2016-08-22
  Administered 2016-08-21 – 2016-08-22 (×2): 8.6 mg via ORAL
  Filled 2016-08-21 (×2): qty 1

## 2016-08-21 MED ORDER — ASPIRIN EC 325 MG PO TBEC
325.0000 mg | DELAYED_RELEASE_TABLET | Freq: Every day | ORAL | Status: DC
  Administered 2016-08-22: 325 mg via ORAL
  Filled 2016-08-21: qty 1

## 2016-08-21 MED ORDER — SORBITOL 70 % SOLN: 30.0000 mL | Freq: Every day | Status: DC | PRN

## 2016-08-21 MED ORDER — MIDAZOLAM HCL 2 MG/2ML IJ SOLN
INTRAMUSCULAR | Status: AC
  Administered 2016-08-21: 1.5 mg
  Filled 2016-08-21: qty 2

## 2016-08-21 SURGICAL SUPPLY — 68 items
ADH SKN CLS APL DERMABOND .7 (GAUZE/BANDAGES/DRESSINGS)
BANDAGE ACE 6X5 VEL STRL LF (GAUZE/BANDAGES/DRESSINGS) ×1 IMPLANT
BANDAGE ESMARK 6X9 LF (GAUZE/BANDAGES/DRESSINGS) ×1 IMPLANT
BLADE SAG 18X100X1.27 (BLADE) ×2 IMPLANT
BNDG CMPR 9X6 STRL LF SNTH (GAUZE/BANDAGES/DRESSINGS) ×1
BNDG COHESIVE 6X5 TAN STRL LF (GAUZE/BANDAGES/DRESSINGS) ×2 IMPLANT
BNDG ESMARK 6X9 LF (GAUZE/BANDAGES/DRESSINGS) ×2
BOWL SMART MIX CTS (DISPOSABLE) ×2 IMPLANT
CAPT KNEE TOTAL 3 ×1 IMPLANT
CEMENT BONE SIMPLEX SPEEDSET (Cement) ×4 IMPLANT
CLSR STERI-STRIP ANTIMIC 1/2X4 (GAUZE/BANDAGES/DRESSINGS) ×4 IMPLANT
COVER SURGICAL LIGHT HANDLE (MISCELLANEOUS) ×2 IMPLANT
CUFF TOURNIQUET SINGLE 34IN LL (TOURNIQUET CUFF) ×2 IMPLANT
DERMABOND ADVANCED (GAUZE/BANDAGES/DRESSINGS)
DERMABOND ADVANCED .7 DNX12 (GAUZE/BANDAGES/DRESSINGS) IMPLANT
DRAPE HALF SHEET 40X57 (DRAPES) ×2 IMPLANT
DRAPE U-SHAPE 47X51 STRL (DRAPES) ×2 IMPLANT
DRSG ADAPTIC 3X8 NADH LF (GAUZE/BANDAGES/DRESSINGS) ×2 IMPLANT
DRSG MEPILEX BORDER 4X12 (GAUZE/BANDAGES/DRESSINGS) ×1 IMPLANT
DRSG MEPILEX BORDER 4X8 (GAUZE/BANDAGES/DRESSINGS) ×2 IMPLANT
DURAPREP 26ML APPLICATOR (WOUND CARE) ×4 IMPLANT
ELECT CAUTERY BLADE 6.4 (BLADE) ×2 IMPLANT
ELECT REM PT RETURN 9FT ADLT (ELECTROSURGICAL) ×2
ELECTRODE REM PT RTRN 9FT ADLT (ELECTROSURGICAL) ×1 IMPLANT
FACESHIELD WRAPAROUND (MASK) ×4 IMPLANT
FACESHIELD WRAPAROUND OR TEAM (MASK) ×2 IMPLANT
GAUZE SPONGE 4X4 12PLY STRL (GAUZE/BANDAGES/DRESSINGS) ×2 IMPLANT
GLOVE BIO SURGEON STRL SZ7.5 (GLOVE) ×4 IMPLANT
GLOVE BIOGEL PI IND STRL 6.5 (GLOVE) IMPLANT
GLOVE BIOGEL PI IND STRL 7.0 (GLOVE) IMPLANT
GLOVE BIOGEL PI IND STRL 8 (GLOVE) ×2 IMPLANT
GLOVE BIOGEL PI INDICATOR 6.5 (GLOVE) ×1
GLOVE BIOGEL PI INDICATOR 7.0 (GLOVE) ×2
GLOVE BIOGEL PI INDICATOR 8 (GLOVE) ×2
GLOVE SURG SS PI 7.0 STRL IVOR (GLOVE) ×3 IMPLANT
GOWN STRL REUS W/ TWL LRG LVL3 (GOWN DISPOSABLE) ×2 IMPLANT
GOWN STRL REUS W/ TWL XL LVL3 (GOWN DISPOSABLE) ×1 IMPLANT
GOWN STRL REUS W/TWL LRG LVL3 (GOWN DISPOSABLE) ×4
GOWN STRL REUS W/TWL XL LVL3 (GOWN DISPOSABLE) ×2
HANDPIECE INTERPULSE COAX TIP (DISPOSABLE) ×2
IMMOBILIZER KNEE 22 (SOFTGOODS) ×1 IMPLANT
IMMOBILIZER KNEE 22 UNIV (SOFTGOODS) ×2 IMPLANT
IMMOBILIZER KNEE 24 THIGH 36 (MISCELLANEOUS) IMPLANT
IMMOBILIZER KNEE 24 UNIV (MISCELLANEOUS)
KIT BASIN OR (CUSTOM PROCEDURE TRAY) ×2 IMPLANT
KIT ROOM TURNOVER OR (KITS) ×2 IMPLANT
MANIFOLD NEPTUNE II (INSTRUMENTS) ×2 IMPLANT
NDL 18GX1X1/2 (RX/OR ONLY) (NEEDLE) ×1 IMPLANT
NEEDLE 18GX1X1/2 (RX/OR ONLY) (NEEDLE) ×2 IMPLANT
NS IRRIG 1000ML POUR BTL (IV SOLUTION) ×2 IMPLANT
PACK TOTAL JOINT (CUSTOM PROCEDURE TRAY) ×2 IMPLANT
PACK UNIVERSAL I (CUSTOM PROCEDURE TRAY) ×2 IMPLANT
PAD ARMBOARD 7.5X6 YLW CONV (MISCELLANEOUS) ×2 IMPLANT
SET HNDPC FAN SPRY TIP SCT (DISPOSABLE) IMPLANT
STAPLER VISISTAT 35W (STAPLE) IMPLANT
SUCTION FRAZIER HANDLE 10FR (MISCELLANEOUS) ×1
SUCTION TUBE FRAZIER 10FR DISP (MISCELLANEOUS) ×1 IMPLANT
SUT MNCRL AB 4-0 PS2 18 (SUTURE) ×2 IMPLANT
SUT MON AB 2-0 CT1 27 (SUTURE) ×4 IMPLANT
SUT VIC AB 0 CT1 27 (SUTURE) ×4
SUT VIC AB 0 CT1 27XBRD ANBCTR (SUTURE) ×1 IMPLANT
SUT VIC AB 1 CT1 27 (SUTURE) ×4
SUT VIC AB 1 CT1 27XBRD ANBCTR (SUTURE) ×2 IMPLANT
SYR 50ML LL SCALE MARK (SYRINGE) ×2 IMPLANT
TOWEL OR 17X24 6PK STRL BLUE (TOWEL DISPOSABLE) ×2 IMPLANT
TOWEL OR 17X26 10 PK STRL BLUE (TOWEL DISPOSABLE) ×2 IMPLANT
TRAY CATH 16FR W/PLASTIC CATH (SET/KITS/TRAYS/PACK) IMPLANT
TRAY FOLEY BAG SILVER LF 14FR (CATHETERS) IMPLANT

## 2016-08-21 NOTE — Anesthesia Procedure Notes (Signed)
Procedure Name: Intubation Date/Time: 08/21/2016 10:35 AM Performed by: Ollen Bowl Pre-anesthesia Checklist: Patient identified, Emergency Drugs available, Suction available and Patient being monitored Patient Re-evaluated:Patient Re-evaluated prior to inductionOxygen Delivery Method: Circle system utilized Preoxygenation: Pre-oxygenation with 100% oxygen Intubation Type: IV induction Ventilation: Mask ventilation without difficulty Laryngoscope Size: Miller and 2 Grade View: Grade I Tube type: Oral Number of attempts: 1 Airway Equipment and Method: Stylet Placement Confirmation: ETT inserted through vocal cords under direct vision,  positive ETCO2 and breath sounds checked- equal and bilateral Secured at: 24 cm Tube secured with: Tape Dental Injury: Teeth and Oropharynx as per pre-operative assessment

## 2016-08-21 NOTE — Anesthesia Postprocedure Evaluation (Signed)
Anesthesia Post Note  Patient: [name]  Procedure(s) Performed: Procedure(s) (LRB): LEFT TOTAL KNEE ARTHROPLASTY (Left)  Patient location during evaluation: PACU Anesthesia Type: General Level of consciousness: awake Pain management: pain level controlled Vital Signs Assessment: post-procedure vital signs reviewed and stable Respiratory status: spontaneous breathing Cardiovascular status: stable Anesthetic complications: no       Last Vitals:  Vitals:   08/21/16 1630 08/21/16 1645  BP:    Pulse: 86 91  Resp: 13 17  Temp:  36.7 C    Last Pain: There were no vitals filed for this visit.               Elma Shands

## 2016-08-21 NOTE — Anesthesia Procedure Notes (Signed)
Anesthesia Regional Block: Adductor canal block   Pre-Anesthetic Checklist: ,, timeout performed, Correct Patient, Correct Site, Correct Laterality, Correct Procedure, Correct Position, site marked, Risks and benefits discussed, Surgical consent,  At surgeon's request and post-op pain management  Laterality: Left  Prep: chloraprep       Needles:   Needle Type: Stimulator Needle - 80          Additional Needles:   Procedures: Doppler guided, Ultrasound guided,,,,,,  Narrative:  Start time: 08/21/2016 9:55 AM End time: 08/21/2016 10:15 AM Injection made incrementally with aspirations every 5 mL.  Performed by: Personally  Anesthesiologist: Belinda Block

## 2016-08-21 NOTE — Interval H&P Note (Signed)
History and Physical Interval Note:  08/21/2016 8:50 AM  [name]  has presented today for surgery, with the diagnosis of OA LEFT KNEE  The various methods of treatment have been discussed with the patient and family. After consideration of risks, benefits and other options for treatment, the patient has consented to  Procedure(s): LEFT TOTAL KNEE ARTHROPLASTY (Left) as a surgical intervention .  The patient's history has been reviewed, patient examined, no change in status, stable for surgery.  I have reviewed the patient's chart and labs.  Questions were answered to the patient's satisfaction.     MURPHY, TIMOTHY D

## 2016-08-21 NOTE — Anesthesia Preprocedure Evaluation (Addendum)
Anesthesia Evaluation  Patient identified by MRN, date of birth, ID band Patient awake    Reviewed: Allergy & Precautions, NPO status   History of Anesthesia Complications (+) PONV  Airway Mallampati: II  TM Distance: >3 FB     Dental   Pulmonary neg pulmonary ROS,    breath sounds clear to auscultation       Cardiovascular hypertension,  Rhythm:Regular Rate:Normal     Neuro/Psych    GI/Hepatic Neg liver ROS, GERD  ,  Endo/Other  Hypothyroidism   Renal/GU negative Renal ROS     Musculoskeletal  (+) Arthritis ,   Abdominal   Peds  Hematology   Anesthesia Other Findings   Reproductive/Obstetrics                             Anesthesia Physical Anesthesia Plan  ASA: III  Anesthesia Plan: General   Post-op Pain Management:  Regional for Post-op pain   Induction: Intravenous  Airway Management Planned: Oral ETT  Additional Equipment:   Intra-op Plan:   Post-operative Plan: Extubation in OR  Informed Consent: I have reviewed the patients History and Physical, chart, labs and discussed the procedure including the risks, benefits and alternatives for the proposed anesthesia with the patient or authorized representative who has indicated his/her understanding and acceptance.   Dental advisory given  Plan Discussed with: CRNA and Anesthesiologist  Anesthesia Plan Comments:         Anesthesia Quick Evaluation

## 2016-08-21 NOTE — Discharge Instructions (Signed)

## 2016-08-21 NOTE — Progress Notes (Signed)
Orthopedic Tech Progress Note Patient Details:  [name] 12-Jul-1945 [id number]  CPM Left Knee CPM Left Knee: On Left Knee Flexion (Degrees): 90 Left Knee Extension (Degrees): 0 Additional Comments: foot roll   [name] 08/21/2016, 1:40 PM

## 2016-08-21 NOTE — Transfer of Care (Signed)
Immediate Anesthesia Transfer of Care Note  Patient: [name]  Procedure(s) Performed: Procedure(s): LEFT TOTAL KNEE ARTHROPLASTY (Left)  Patient Location: PACU  Anesthesia Type:GA combined with regional for post-op pain  Level of Consciousness: awake and alert   Airway & Oxygen Therapy: Patient Spontanous Breathing and Patient connected to nasal cannula oxygen  Post-op Assessment: Report given to RN and Post -op Vital signs reviewed and stable    Post vital signs: Reviewed and stable  Last Vitals:  Vitals:   08/21/16 0810 08/21/16 1000  BP: (!) 185/87 (!) 185/87  Pulse: (!) 107 (!) 104  Resp: 20 15  Temp: 36.9 C     Last Pain: There were no vitals filed for this visit.    Patients Stated Pain Goal: 4 (37/34/28 7681)  Complications: No apparent anesthesia complications

## 2016-08-21 NOTE — Op Note (Signed)
DATE OF SURGERY:  08/21/2016 TIME: 11:43 AM  PATIENT NAME:  [name]   AGE: 71 y.o.    PRE-OPERATIVE DIAGNOSIS:  OA LEFT KNEE  POST-OPERATIVE DIAGNOSIS:  Same  PROCEDURE:  Procedure(s): LEFT TOTAL KNEE ARTHROPLASTY   SURGEON:  Mckena Chern D, MD   ASSISTANT:  Roxan Hockey, PA-C, he was present and scrubbed throughout the case, critical for completion in a timely fashion, and for retraction, instrumentation, and closure.    OPERATIVE IMPLANTS: [employer] Triathlon Posterior Stabilized.  Femur size 4, Tibia size 4, Patella size 29 3-peg oval button, with a 13 mm polyethylene insert.   PREOPERATIVE INDICATIONS:  [name] is a 71 y.o. year old female with end stage bone on bone degenerative arthritis of the knee who failed conservative treatment, including injections, antiinflammatories, activity modification, and assistive devices, and had significant impairment of their activities of daily living, and elected for Total Knee Arthroplasty.   The risks, benefits, and alternatives were discussed at length including but not limited to the risks of infection, bleeding, nerve injury, stiffness, blood clots, the need for revision surgery, cardiopulmonary complications, among others, and they were willing to proceed.   OPERATIVE DESCRIPTION:  The patient was brought to the operative room and placed in a supine position.  General anesthesia was administered.  IV antibiotics were given.  The lower extremity was prepped and draped in the usual sterile fashion.  Time out was performed.  The leg was elevated and exsanguinated and the tourniquet was inflated.  Anterior approach was performed.  The patella was everted and osteophytes were removed.  The anterior horn of the medial and lateral meniscus was removed.   The distal femur was opened with the drill and the intramedullary distal femoral cutting jig was utilized, set at 5 degrees resecting 8 mm off the distal femur.  Care  was taken to protect the collateral ligaments.  The distal femoral sizing jig was applied, taking care to avoid notching.  Then the 4-in-1 cutting jig was applied and the anterior and posterior femur was cut, along with the chamfer cuts.  All posterior osteophytes were removed.  The flexion gap was then measured and was symmetric with the extension gap.  Then the extramedullary tibial cutting jig was utilized making the appropriate cut using the anterior tibial crest as a reference building in appropriate posterior slope.  Care was taken during the cut to protect the medial and collateral ligaments.  The proximal tibia was removed along with the posterior horns of the menisci.  The PCL was sacrificed.    The extensor gap was measured and was approximately 55mm.    I completed the distal femoral preparation using the appropriate jig to prepare the box.  The patella was then measured, and cut with the saw.    The proximal tibia sized and prepared accordingly with the reamer and the punch, and then all components were trialed with the 65mm poly insert.  The knee was found to have excellent balance and full motion.    The above named components were then cemented into place and all excess cement was removed. Poly tibial piece and patella were inserted.  I was very happy with his stability and ROM  I performed a periarticular injection with marcaine and toradol  The knee was easily taken through a range of motion and the patella tracked well and the knee irrigated copiously and the parapatellar and subcutaneous tissue closed with vicryl, and monocryl with steri strips for the  skin.  The incision was dressed with sterile gauze and the tourniquet released and the patient was awakened and returned to the PACU in stable and satisfactory condition.  There were no complications.  Total tourniquet time was 70 minutes.   POSTOPERATIVE PLAN: post op Abx, DVT px: SCD's, TED's, Early ambulation and chemical  px.

## 2016-08-21 NOTE — Anesthesia Postprocedure Evaluation (Signed)
Anesthesia Post Note  Patient: [name]  Procedure(s) Performed: Procedure(s) (LRB): LEFT TOTAL KNEE ARTHROPLASTY (Left)  Patient location during evaluation: PACU Anesthesia Type: General Level of consciousness: awake Pain management: pain level controlled Vital Signs Assessment: post-procedure vital signs reviewed and stable Respiratory status: spontaneous breathing Cardiovascular status: stable Anesthetic complications: no       Last Vitals:  Vitals:   08/21/16 1626 08/21/16 1630  BP: (!) 142/79   Pulse: 88 86  Resp: 14 13  Temp:      Last Pain: There were no vitals filed for this visit.               Clavin Ruhlman

## 2016-08-21 NOTE — Anesthesia Postprocedure Evaluation (Signed)
Anesthesia Post Note  Patient: [name]  Procedure(s) Performed: Procedure(s) (LRB): LEFT TOTAL KNEE ARTHROPLASTY (Left)  Patient location during evaluation: PACU Anesthesia Type: General Level of consciousness: awake Pain management: pain level controlled Vital Signs Assessment: post-procedure vital signs reviewed and stable Respiratory status: spontaneous breathing Cardiovascular status: stable Anesthetic complications: no       Last Vitals:  Vitals:   08/21/16 1225 08/21/16 1230  BP: (!) 165/96   Pulse: 99 99  Resp: 12 12  Temp: 36.6 C     Last Pain: There were no vitals filed for this visit.               Zaccai Chavarin

## 2016-08-22 ENCOUNTER — Encounter (HOSPITAL_COMMUNITY): Payer: Self-pay | Admitting: Orthopedic Surgery

## 2016-08-22 DIAGNOSIS — M1712 Unilateral primary osteoarthritis, left knee: Secondary | ICD-10-CM | POA: Diagnosis present

## 2016-08-22 LAB — CBC
HCT: 29.1 % — ABNORMAL LOW (ref 36.0–46.0)
HEMOGLOBIN: 10.1 g/dL — AB (ref 12.0–15.0)
MCH: 31.1 pg (ref 26.0–34.0)
MCHC: 34.7 g/dL (ref 30.0–36.0)
MCV: 89.5 fL (ref 78.0–100.0)
Platelets: 159 10*3/uL (ref 150–400)
RBC: 3.25 MIL/uL — AB (ref 3.87–5.11)
RDW: 12.4 % (ref 11.5–15.5)
WBC: 9.1 10*3/uL (ref 4.0–10.5)

## 2016-08-22 NOTE — Evaluation (Signed)
Occupational Therapy Evaluation and Discharge Patient Details Name: [name] MRN: [id number] DOB: 04-16-1946 Today's Date: 08/22/2016    History of Present Illness Pt admit for left TKA.  R TKA 2001, R THA 2007, Cervical fusion 1993, L-spine surgery 1998, Hysterectomy 1989, Bowel obstruction w/ infxn 1995, [place] sx 2007. Also hx of depression.    Clinical Impression   Pt was modified independent in self care and ambulating with a RW prior to admission. 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 Pt currently requires min assist for ADL and supervision for OOB mobility. All education completed. Pt's equipment needs are met at home and she has 24 hour assistance. No further OT needs.    Follow Up Recommendations  No OT follow up    Equipment Recommendations  None recommended by OT    Recommendations for Other Services       Precautions / Restrictions Precautions Precautions: Fall;Knee Precaution Booklet Issued: Yes (comment) Precaution Comments: Discussed no pillow under knee and can place towel under heel to promote knee extension.  Restrictions Weight Bearing Restrictions: Yes LLE Weight Bearing: Weight bearing as tolerated      Mobility Bed Mobility              General bed mobility comments: pt in chair  Transfers Overall transfer level: Needs assistance Equipment used: Rolling walker (2 wheeled) Transfers: Sit to/from Stand Sit to Stand: Modified independent (Device/Increase time)         General transfer comment: No assist needed.  Cues for hand placement only     Balance Overall balance assessment: Needs assistance Sitting-balance support: No upper extremity supported;Feet supported Sitting balance-[name]: Good     Standing balance support: Bilateral upper extremity supported;During functional activity Standing balance-[name]: Fair Standing balance comment: can stand statically without UE support.                              ADL Overall ADL's : Needs assistance/impaired Eating/Feeding: Independent;Sitting   Grooming: Wash/dry hands;Standing;Min guard   Upper Body Bathing: Set up;Sitting   Lower Body Bathing: Minimal assistance;Sit to/from stand Lower Body Bathing Details (indicate cue type and reason): recommended use of long handled bath sponge Upper Body Dressing : Set up;Sitting   Lower Body Dressing: Minimal assistance;Sit to/from stand Lower Body Dressing Details (indicate cue type and reason): pt reports recalling how to use reacher and sock aide, instructed in safe footwear and to dress operated leg first Toilet Transfer: Ambulation;RW;Supervision/safety         Tub/Shower Transfer Details (indicate cue type and reason): educated in technique Functional mobility during ADLs: Rolling walker;Supervision/safety General ADL Comments: instructed in how to transport items safely with walker     Vision Baseline Vision/History: Wears glasses Wears Glasses: At all times Patient Visual Report: No change from baseline       Perception     Praxis      Pertinent Vitals/Pain Pain Assessment: Faces Faces Pain Scale: Hurts a little bit Pain Location: left knee Pain Descriptors / Indicators: Sore Pain Intervention(s): Premedicated before session;Repositioned;Ice applied     Hand Dominance Right   Extremity/Trunk Assessment Upper Extremity Assessment Upper Extremity Assessment: Overall WFL for tasks assessed   Lower Extremity Assessment Lower Extremity Assessment: Defer to PT evaluation LLE Deficits / Details: 4-100 degrees A/ROM; knee 3/5, ankle 4/5, hip 4/5.   Cervical / Trunk Assessment Cervical / Trunk Assessment: Normal  Communication Communication Communication: No difficulties   Cognition Arousal/Alertness: Awake/alert Behavior During Therapy: WFL for tasks assessed/performed Overall Cognitive Status: Within Functional Limits for tasks assessed                      General Comments       Exercises Exercises: Total Joint     Shoulder Instructions      Home Living Family/patient expects to be discharged to:: Private residence Living Arrangements: Children;Other relatives Available Help at Discharge: Family;Available 24 hours/day Type of Home: House Home Access: Ramped entrance     Home Layout: One level     Bathroom Shower/Tub: Occupational psychologist: Handicapped height Bathroom Accessibility: Yes   Home Equipment: Toilet riser;Grab bars - tub/shower;Walker - 2 wheels;Walker - 4 wheels;Bedside commode;Shower seat - built in;Hand held shower head;Wheelchair - Scientist, physiological: Reacher;Sock aid        Prior Functioning/Environment Level of Independence: Independent with assistive device(s)        Comments: was using RW PTA.  Does not have to assist her mother at home        OT Problem List: Impaired balance (sitting and/or standing);Pain      OT Treatment/Interventions:      OT Goals(Current goals can be found in the care plan section) Acute Rehab OT Goals Patient Stated Goal: to go home  OT Frequency:     Barriers to D/C:            Co-evaluation              End of Session Equipment Utilized During Treatment: Gait belt;Rolling walker  Activity Tolerance: Patient tolerated treatment well Patient left: in chair;with call bell/phone within reach;with family/visitor present  OT Visit Diagnosis: Unsteadiness on feet (R26.81);Pain Pain - Right/Left: Left Pain - part of body: Knee                ADL either performed or assessed with clinical judgement  Time: 8867-7373 OT Time Calculation (min): 30 min Charges:  OT General Charges $OT Visit: 1 Procedure OT Evaluation $OT Eval Low Complexity: 1 Procedure G-Codes: OT G-codes **NOT FOR INPATIENT CLASS** Functional Assessment Tool Used: Clinical judgement Functional Limitation: Self care Self Care Current Status  (G6815): At least 20 percent but less than 40 percent impaired, limited or restricted Self Care Discharge Status [phone]): At least 20 percent but less than 40 percent impaired, limited or restricted   [name] 08/22/2016, 2:04 PM  [phone]

## 2016-08-22 NOTE — Progress Notes (Signed)
Late entry for missed g code 2016-08-30:   2016/08/30 1356  PT G-Codes **NOT FOR INPATIENT CLASS**  Functional Assessment Tool Used AM-PAC 6 Clicks Basic Mobility  Functional Limitation Mobility: Walking and moving around  Mobility: Walking and Moving Around Current Status [phone]) CJ  Mobility: Walking and Moving Around Goal Status [phone]) CI  [facility] Acute Rehabilitation [phone] (pager)

## 2016-08-22 NOTE — Evaluation (Signed)
Physical Therapy Evaluation Patient Details Name: [name] MRN: [id number] DOB: [birth year] Today's Date: 08/22/2016   History of Present Illness  Pt admit for left TKA.  R TKA 2001, R THA 2007, Cervical fusion 1993, L-spine surgery 1998, Hysterectomy 1989, Bowel obstruction w/ infxn 1995, [place] sx 2007. Also hx of depression.   Clinical Impression  Pt admitted with above diagnosis. Pt currently with functional limitations due to the deficits listed below (see PT Problem List). Pt ambulated with RW with overall good safety.  Pt has assist at home and should do well to go home with HHPT f/u. Will follow while in hospital.  Pt will benefit from skilled PT to increase their independence and safety with mobility to allow discharge to the venue listed below.      Follow Up Recommendations Home health PT;Supervision/Assistance - 24 hour    Equipment Recommendations  None recommended by PT    Recommendations for Other Services       Precautions / Restrictions Precautions Precautions: Fall;Knee Precaution Booklet Issued: Yes (comment) Precaution Comments: Discussed no pillow under knee and can place towel under heel to promote knee extension.  Restrictions Weight Bearing Restrictions: Yes LLE Weight Bearing: Weight bearing as tolerated      Mobility  Bed Mobility Overal bed mobility: Independent             General bed mobility comments: Incr time needed but got to EOB on her own.   Transfers Overall transfer level: Needs assistance Equipment used: Rolling walker (2 wheeled) Transfers: Sit to/from Stand Sit to Stand: Supervision         General transfer comment: No assist needed.  Cues for hand placemetn only   Ambulation/Gait Ambulation/Gait assistance: Min guard Ambulation Distance (Feet): 45 Feet Assistive device: Rolling walker (2 wheeled) Gait Pattern/deviations: Step-to pattern;Decreased step length - left;Decreased stance time -  left;Decreased weight shift to left;Antalgic   Gait velocity interpretation: Below normal speed for age/gender General Gait Details: Pt able to ambulate with some discomfort in right LE due to IV in foot.  Limited distance due to the IV in foot.  STeady gait with RW.   Stairs            Wheelchair Mobility    Modified Rankin (Stroke Patients Only)       Balance Overall balance assessment: Needs assistance Sitting-balance support: No upper extremity supported;Feet supported Sitting balance-[name]: Good     Standing balance support: Bilateral upper extremity supported;During functional activity Standing balance-[name]: Fair Standing balance comment: can stand statically without UE support.                              Pertinent Vitals/Pain Pain Assessment: Faces Faces Pain Scale: Hurts little more Pain Location: left knee Pain Descriptors / Indicators: Sore Pain Intervention(s): Limited activity within patient's tolerance;Monitored during session;Premedicated before session;Repositioned    Home Living Family/patient expects to be discharged to:: Private residence Living Arrangements: Children;Other relatives Available Help at Discharge: Family;Available 24 hours/day (Lives with mom, granddaughter and great niece assist pt) Type of Home: House Home Access: Ramped entrance     Home Layout: One level Home Equipment: Toilet riser;Grab bars - tub/shower;Walker - 2 wheels;Walker - 4 wheels;Bedside commode;Shower seat - built in;Hand held shower head;Wheelchair - manual      Prior Function Level of Independence: Independent with assistive device(s)         Comments:  was using RW PTA.  Does not have to assist her mother at home     Hand Dominance        Extremity/Trunk Assessment   Upper Extremity Assessment Upper Extremity Assessment: Defer to OT evaluation    Lower Extremity Assessment Lower Extremity Assessment: LLE  deficits/detail LLE Deficits / Details: 4-100 degrees A/ROM; knee 3/5, ankle 4/5, hip 4/5.    Cervical / Trunk Assessment Cervical / Trunk Assessment: Normal  Communication   Communication: No difficulties  Cognition Arousal/Alertness: Awake/alert Behavior During Therapy: WFL for tasks assessed/performed Overall Cognitive Status: Within Functional Limits for tasks assessed                      General Comments      Exercises Total Joint Exercises Ankle Circles/Pumps: AROM;Both;15 reps;Supine Quad Sets: AROM;Both;10 reps;Supine Heel Slides: AROM;Both;10 reps;Supine Straight Leg Raises: AROM;10 reps;Supine;Both Knee Flexion: AROM;Left;5 reps;Seated Goniometric ROM: 4-100 degrees   Assessment/Plan    PT Assessment Patient needs continued PT services  PT Problem List Decreased strength;Decreased range of motion;Decreased activity tolerance;Decreased balance;Decreased mobility;Decreased knowledge of use of DME;Decreased safety awareness;Decreased knowledge of precautions;Pain       PT Treatment Interventions DME instruction;Gait training;Functional mobility training;Therapeutic activities;Therapeutic exercise;Balance training;Patient/family education    PT Goals (Current goals can be found in the Care Plan section)  Acute Rehab PT Goals Patient Stated Goal: to go home PT Goal Formulation: With patient Time For Goal Achievement: 09/05/16 Potential to Achieve Goals: Good    Frequency 7X/week   Barriers to discharge        Co-evaluation               End of Session Equipment Utilized During Treatment: Gait belt Activity Tolerance: Patient limited by fatigue;Patient limited by pain (IV in right foot) Patient left: in chair;with call bell/phone within reach;with chair alarm set;with family/visitor present Nurse Communication: Mobility status (IV in right foot limit pt) PT Visit Diagnosis: Muscle weakness (generalized) (M62.81)         Time:  7048-8891 PT Time Calculation (min) (ACUTE ONLY): 20 min   Charges:   PT Evaluation $PT Eval Low Complexity: 1 Procedure     PT G Codes:         Godfrey Pick Daemian Gahm 09-10-2016, 11:25 AM [facility] [phone] (pager)

## 2016-08-22 NOTE — Discharge Summary (Signed)
Discharge Summary  Patient ID: [name] MRN: [id number] DOB/AGE: [birth year] 71 y.o.  Admit date: 08/21/2016 Discharge date: 08/22/2016  Admission Diagnoses:  Primary osteoarthritis of left knee  Discharge Diagnoses:  Principal Problem:   Primary osteoarthritis of left knee Active Problems:   Depression   S/P thyroidectomy   S/P cervical spinal fusion   S/P total knee arthroplasty, right   Past Medical History:  Diagnosis Date  . Arthritis   . Complication of anesthesia    "pt. holds her breath when waking up"  . GERD (gastroesophageal reflux disease)   . Hypertension   . Hypothyroidism   . PONV (postoperative nausea and vomiting)   . Staph infection    abdomen and returned 5 yrs. later    Surgeries: Procedure(s): LEFT TOTAL KNEE ARTHROPLASTY on 08/21/2016   Consultants (if any):   Discharged Condition: Improved  Hospital Course: JAEDEN [name] is an 71 y.o. female who was admitted 08/21/2016 with a diagnosis of Primary osteoarthritis of left knee and went to the operating room on 08/21/2016 and underwent the above named procedures.    She was given perioperative antibiotics:  Anti-infectives    Start     Dose/Rate Route Frequency Ordered Stop   08/21/16 1800  ceFAZolin (ANCEF) IVPB 2g/100 mL premix     2 g 200 mL/hr over 30 Minutes Intravenous Every 6 hours 08/21/16 1706 08/22/16 0044   08/21/16 0903  ceFAZolin (ANCEF) 2-4 GM/100ML-% IVPB    Comments:  Leandrew Koyanagi   : cabinet override      08/21/16 0903 08/21/16 1033   08/21/16 0901  ceFAZolin (ANCEF) IVPB 2g/100 mL premix     2 g 200 mL/hr over 30 Minutes Intravenous On call to O.R. 08/21/16 0901 08/21/16 1033    .  She was given sequential compression devices, early ambulation, and ASA 325 mg for DVT prophylaxis.  She benefited maximally from the hospital stay and there were no complications.    Recent vital signs:  Vitals:   08/21/16 2100 08/22/16 0420  BP: (!) 153/84 136/62  Pulse: 93 79   Resp: 16 16  Temp: 98 F (36.7 C) 98.5 F (36.9 C)    Recent laboratory studies:  Lab Results  Component Value Date   HGB 10.1 (L) 08/22/2016   HGB 13.9 08/09/2016   HGB 14.1 08/31/2009   Lab Results  Component Value Date   WBC 9.1 08/22/2016   PLT 159 08/22/2016   No results found for: INR Lab Results  Component Value Date   NA 132 (L) 08/09/2016   K 3.9 08/09/2016   CL 97 (L) 08/09/2016   CO2 27 08/09/2016   BUN 6 08/09/2016   CREATININE 0.71 08/09/2016   GLUCOSE 91 08/09/2016    Discharge Medications:   Allergies as of 08/22/2016      Reactions   Sulfamethoxazole Swelling   Lips swell and feel numb   Sulfonamide Derivatives       Medication List    TAKE these medications   aspirin EC 325 MG tablet Take 1 tablet (325 mg total) by mouth daily. For 30 days post op for DVT Prophylaxis   baclofen 10 MG tablet Commonly known as:  LIORESAL Take 1 tablet (10 mg total) by mouth 3 (three) times daily as needed for muscle spasms.   BAYER BACK & BODY 500-32.5 MG Tabs Generic drug:  Aspirin-Caffeine Take 2 tablets by mouth 3 (three) times daily as needed (for pain.).   Biotin 2500 MCG Caps  Take 5,000 mcg by mouth 2 (two) times daily.   bisoprolol-hydrochlorothiazide 2.5-6.25 MG tablet Commonly known as:  ZIAC Take 1 tablet by mouth at bedtime.   CALCIUM 600+D PLUS MINERALS 600-400 MG-UNIT Chew Chew 1 tablet by mouth daily.   diclofenac 75 MG EC tablet Commonly known as:  VOLTAREN Take 75 mg by mouth 2 (two) times daily.   docusate sodium 100 MG capsule Commonly known as:  COLACE Take 1 capsule (100 mg total) by mouth 2 (two) times daily. To prevent constipation while taking pain medication.   DULoxetine 30 MG capsule Commonly known as:  CYMBALTA Take 30 mg by mouth daily.   hydroxychloroquine 200 MG tablet Commonly known as:  PLAQUENIL Take 200 mg by mouth 2 (two) times daily.   levothyroxine 75 MCG tablet Commonly known as:  SYNTHROID,  LEVOTHROID Take 75 mcg by mouth daily before breakfast.   milk thistle 175 MG tablet Take 350 mg by mouth daily.   omeprazole 20 MG capsule Commonly known as:  PRILOSEC Take 1 capsule (20 mg total) by mouth daily. While taking anti inflammatory medicine daily   ondansetron 4 MG tablet Commonly known as:  ZOFRAN Take 1 tablet (4 mg total) by mouth every 8 (eight) hours as needed for nausea or vomiting.   oxyCODONE-acetaminophen 5-325 MG tablet Commonly known as:  ROXICET Take 1-2 tablets by mouth every 4 (four) hours as needed for severe pain.   Turmeric 500 MG Caps Take 1,000 mg by mouth 2 (two) times daily.       Diagnostic Studies: Dg Knee Left Port  Result Date: 08/21/2016 CLINICAL DATA:  Status post left knee replacement. EXAM: PORTABLE LEFT KNEE - 1-2 VIEW COMPARISON:  None. FINDINGS: The femoral and tibial components appear to be well situated. No fracture or dislocation is noted. Expected postoperative changes are seen in the surrounding soft tissues. IMPRESSION: Status post left total knee arthroplasty. Electronically Signed   By: Marijo Conception, M.D.   On: 08/21/2016 13:12    Disposition:   Discharge Instructions    Discharge patient    Complete by:  As directed    Okay to Discharge IF: Cleared by PT, pain & nausea controlled PO, ambulating and tolerating diet.   Discharge disposition:  01-Home or Self Care   Discharge patient date:  08/22/2016      Follow-up Information    MURPHY, Ernesta Amble, MD Follow up.   Specialty:  Orthopedic Surgery Contact information: [address] [place] [address] [phone]           Signed: Prudencio Burly III PA-C 08/22/2016, 7:38 AM

## 2016-08-22 NOTE — Progress Notes (Signed)
Physical Therapy Discharge Patient Details Name: [name] MRN: [id number] DOB: 1945-08-01 Today's Date: 08/22/2016 Time: 1155-2080 PT Time Calculation (min) (ACUTE ONLY): 21 min  Patient discharged from PT services secondary to goals met and no further PT needs identified.  Please see latest therapy progress note for current level of functioning and progress toward goals.    Progress and discharge plan discussed with patient and/or caregiver: Patient/Caregiver agrees with plan  GP Functional Assessment Tool Used: AM-PAC 6 Clicks Basic Mobility Functional Limitation: Mobility: Walking and moving around Mobility: Walking and Moving Around Current Status (E2336): At least 20 percent but less than 40 percent impaired, limited or restricted Mobility: Walking and Moving Around Goal Status [phone]): At least 1 percent but less than 20 percent impaired, limited or restricted   Denice Paradise 08/22/2016, 1:58 PM  [facility] [phone] (pager) [place] [phone] (pager)

## 2016-08-22 NOTE — Progress Notes (Signed)
   Assessment: 1 Day Post-Op  S/P Procedure(s) (LRB): LEFT TOTAL KNEE ARTHROPLASTY (Left) by Dr. Ernesta Amble. Percell Miller on 08/21/16  Principal Problem:   Primary osteoarthritis of left knee Active Problems:   Depression   S/P thyroidectomy   S/P cervical spinal fusion   S/P total knee arthroplasty, right  ADDITIONAL DIAGNOSIS:  Acute Blood Loss Anemia, mild and currently asymptomatic.   Plan: Advance diet Up with therapy D/C IV fluids Incentive spirometry Apply Ice Elevate when not OOB.  Weight Bearing: Weight Bearing as Tolerated (WBAT)  Dressings: Maintain Mepilex.  VTE prophylaxis: Aspirin, SCDs, ambulation Dispo: Home w/ HHPT later today pending PT evaluation / clearance.  Subjective: Patient reports pain as moderate - better with movement.  Less pain w/ WB than prior to surgery. Pain controlled with PO meds.  Tolerating diet.  Urinating.  +Flatus.  No CP, SOB.  OOB in room.  Objective:   VITALS:   Vitals:   08/21/16 1645 08/21/16 1704 08/21/16 2100 08/22/16 0420  BP:   (!) 153/84 136/62  Pulse: 91  93 79  Resp: 17  16 16   Temp: 98 F (36.7 C) 98.2 F (36.8 C) 98 F (36.7 C) 98.5 F (36.9 C)  TempSrc:  Oral Oral Oral  SpO2: 97%  97% 99%  Weight:      Height:       CBC Latest Ref Rng & Units 08/22/2016 08/09/2016 08/31/2009  WBC 4.0 - 10.5 K/uL 9.1 3.1(L) 6.6  Hemoglobin 12.0 - 15.0 g/dL 10.1(L) 13.9 14.1  Hematocrit 36.0 - 46.0 % 29.1(L) 39.7 39.7  Platelets 150 - 400 K/uL 159 223 234   BMP Latest Ref Rng & Units 08/09/2016 08/31/2009  Glucose 65 - 99 mg/dL 91 153(H)  BUN 6 - 20 mg/dL 6 9  Creatinine 0.44 - 1.00 mg/dL 0.71 0.80  Sodium 135 - 145 mmol/L 132(L) 131(L)  Potassium 3.5 - 5.1 mmol/L 3.9 3.8  Chloride 101 - 111 mmol/L 97(L) 97  CO2 22 - 32 mmol/L 27 25  Calcium 8.9 - 10.3 mg/dL 9.3 9.4   Intake/Output      03/20 0701 - 03/21 0700 03/21 0701 - 03/22 0700   P.O. 240    I.V. (mL/kg) 1600 (18.7)    IV Piggyback 100    Total Intake(mL/kg) 1940  (22.6)    Urine (mL/kg/hr) 600    Blood 100    Total Output 700     Net +1240          Urine Occurrence 2 x      Physical Exam: General: NAD.  Daughter at bedside.  In CPM. Resp: No increased wob Cardio: regular rate and rhythm ABD soft Neurologically intact MSK Neurovascularly intact Sensation intact distally Intact pulses distally Dorsiflexion/Plantar flexion intact Incision: Mild surrounding ecchymosis.  Dressing dry with scant bloody drainage.   Charna Elizabeth Martensen III, PA-C 08/22/2016, 7:27 AM

## 2016-08-22 NOTE — Care Management CC44 (Signed)
Condition Code 44 Documentation Completed  Patient Details  Name: [name] MRN: [id number] Date of Birth: 11-16-1945   Condition Code 44 given:  Yes Patient signature on Condition Code 44 notice:  Yes Documentation of 2 MD's agreement:  Yes Code 44 added to claim:  Yes    Ninfa Meeker, RN 08/22/2016, 12:51 PM

## 2016-08-22 NOTE — Progress Notes (Signed)
08/22/16 1300  PT Visit Information  Assistance Needed +1  History of Present Illness Pt admit for left TKA.  R TKA 2001, R THA 2007, Cervical fusion 1993, L-spine surgery 1998, Hysterectomy 1989, Bowel obstruction w/ infxn 1995, Skin infxn 1998, R Foot sx 2007. Also hx of depression.   Precautions  Precautions Fall;Knee  Precaution Booklet Issued Yes (comment)  Precaution Comments Discussed no pillow under knee and can place towel under heel to promote knee extension.   Restrictions  Weight Bearing Restrictions Yes  LLE Weight Bearing WBAT  Pain Assessment  Pain Assessment Faces  Faces Pain Scale 2  Pain Location left knee  Pain Descriptors / Indicators Sore  Pain Intervention(s) Limited activity within patient's tolerance;Monitored during session;Premedicated before session;Repositioned  Cognition  Arousal/Alertness Awake/alert  Behavior During Therapy WFL for tasks assessed/performed  Overall Cognitive Status Within Functional Limits for tasks assessed  Bed Mobility  Overal bed mobility Independent  General bed mobility comments Incr time needed but got to EOB on her own.   Transfers  Overall transfer level Needs assistance  Equipment used Rolling walker (2 wheeled)  Transfers Sit to/from Stand  Sit to Stand Modified independent (Device/Increase time)  General transfer comment No assist needed.  Cues for hand placement only   Ambulation/Gait  Ambulation/Gait assistance Modified independent (Device/Increase time);Supervision  Ambulation Distance (Feet) 250 Feet  Assistive device Rolling walker (2 wheeled)  Gait Pattern/deviations Step-to pattern;Decreased step length - left;Decreased stance time - left;Decreased weight shift to left;Antalgic  General Gait Details Pt able to ambulate with good balance with RW.  Steady gait with RW.   Gait velocity interpretation Below normal speed for age/gender  Balance  Overall balance assessment Needs assistance  Sitting-balance support No  upper extremity supported;Feet supported  Sitting balance-[name] Good  Standing balance support Bilateral upper extremity supported;During functional activity  Standing balance-[name] Fair  Standing balance comment can stand statically without UE support.   Exercises  Exercises Total Joint  Total Joint Exercises  Ankle Circles/Pumps AROM;Both;15 reps;Supine  Quad Sets AROM;Both;10 reps;Supine  Heel Slides AROM;Both;10 reps;Supine  Straight Leg Raises AROM;10 reps;Supine;Both  Knee Flexion AROM;Left;5 reps;Seated  Goniometric ROM 2-110 degrees  Towel Squeeze AROM;Both;15 reps;Supine  Short Arc Quad AROM;Both;10 reps;Supine  Hip ABduction/ADduction AROM;Both;10 reps;Supine  Long Arc Quad AROM;Both;10 reps;Seated  PT - End of Session  Equipment Utilized During Treatment Gait belt  Activity Tolerance Patient limited by fatigue  Patient left in chair;with call bell/phone within reach;with chair alarm set;with family/visitor present  Nurse Communication Mobility status  PT - Assessment/Plan  PT Plan Current plan remains appropriate  PT Visit Diagnosis Muscle weakness (generalized) (M62.81)  PT Frequency (ACUTE ONLY) 7X/week  Follow Up Recommendations Home health PT;Supervision/Assistance - 24 hour  PT equipment None recommended by PT  AM-PAC PT "6 Clicks" Daily Activity Outcome Measure  Difficulty turning over in bed (including adjusting bedclothes, sheets and blankets)? 4  Difficulty moving from lying on back to sitting on the side of the bed?  4  Difficulty sitting down on and standing up from a chair with arms (e.g., wheelchair, bedside commode, etc,.)? 4  Help needed moving to and from a bed to chair (including a wheelchair)? 4  Help needed walking in hospital room? 4  Help needed climbing 3-5 steps with a railing?  1  6 Click Score 21  Mobility G Code  CJ  PT Goal Progression  Progress towards PT goals Goals met/education completed, patient discharged from PT  PT Time    Calculation  PT Start Time (ACUTE ONLY) 1135  PT Stop Time (ACUTE ONLY) 1156  PT Time Calculation (min) (ACUTE ONLY) 21 min  PT General Charges  $$ ACUTE PT VISIT 1 Procedure  PT Treatments  $Gait Training 8-22 mins  Pt progressing well. To d/c home and continue therapy at home.  No further inpatient needs at this time.  Will sign off as pt being discharged soon.  Thanks.  [place] [phone] (pager)

## 2016-08-22 NOTE — Progress Notes (Signed)
Reviewed discharge instructions with patient and patient's daughter.  Answered her questions. Patient was signed off on PT, pain is controled and having no nausea.  Patient is ready for discharge.  Called Mallie Mussel to let him know she was ready.

## 2016-08-22 NOTE — Care Management Obs Status (Signed)
[facility] NOTIFICATION   Patient Details  Name: [name] MRN: [id number] Date of Birth: 1946-02-24   Medicare Observation Status Notification Given:  Yes    Ninfa Meeker, RN 08/22/2016, 12:51 PM

## 2016-08-23 DIAGNOSIS — Z7982 Long term (current) use of aspirin: Secondary | ICD-10-CM | POA: Diagnosis not present

## 2016-08-23 DIAGNOSIS — I10 Essential (primary) hypertension: Secondary | ICD-10-CM | POA: Diagnosis not present

## 2016-08-23 DIAGNOSIS — M1712 Unilateral primary osteoarthritis, left knee: Secondary | ICD-10-CM | POA: Diagnosis not present

## 2016-08-23 DIAGNOSIS — E890 Postprocedural hypothyroidism: Secondary | ICD-10-CM | POA: Diagnosis not present

## 2016-08-23 DIAGNOSIS — Z79891 Long term (current) use of opiate analgesic: Secondary | ICD-10-CM | POA: Diagnosis not present

## 2016-08-23 DIAGNOSIS — F329 Major depressive disorder, single episode, unspecified: Secondary | ICD-10-CM | POA: Diagnosis not present

## 2016-08-23 DIAGNOSIS — Z981 Arthrodesis status: Secondary | ICD-10-CM | POA: Diagnosis not present

## 2016-08-23 DIAGNOSIS — Z96652 Presence of left artificial knee joint: Secondary | ICD-10-CM | POA: Diagnosis not present

## 2016-08-23 DIAGNOSIS — Z96653 Presence of artificial knee joint, bilateral: Secondary | ICD-10-CM | POA: Diagnosis not present

## 2016-08-23 DIAGNOSIS — Z471 Aftercare following joint replacement surgery: Secondary | ICD-10-CM | POA: Diagnosis not present

## 2016-08-24 NOTE — Addendum Note (Signed)
Addendum  created 08/24/16 2027 by Belinda Block, MD   Image imported

## 2016-08-25 DIAGNOSIS — Z79891 Long term (current) use of opiate analgesic: Secondary | ICD-10-CM | POA: Diagnosis not present

## 2016-08-25 DIAGNOSIS — F329 Major depressive disorder, single episode, unspecified: Secondary | ICD-10-CM | POA: Diagnosis not present

## 2016-08-25 DIAGNOSIS — Z471 Aftercare following joint replacement surgery: Secondary | ICD-10-CM | POA: Diagnosis not present

## 2016-08-25 DIAGNOSIS — I10 Essential (primary) hypertension: Secondary | ICD-10-CM | POA: Diagnosis not present

## 2016-08-25 DIAGNOSIS — Z7982 Long term (current) use of aspirin: Secondary | ICD-10-CM | POA: Diagnosis not present

## 2016-08-25 DIAGNOSIS — Z981 Arthrodesis status: Secondary | ICD-10-CM | POA: Diagnosis not present

## 2016-08-25 DIAGNOSIS — Z96653 Presence of artificial knee joint, bilateral: Secondary | ICD-10-CM | POA: Diagnosis not present

## 2016-08-25 DIAGNOSIS — E890 Postprocedural hypothyroidism: Secondary | ICD-10-CM | POA: Diagnosis not present

## 2016-09-02 DIAGNOSIS — M1712 Unilateral primary osteoarthritis, left knee: Secondary | ICD-10-CM | POA: Diagnosis not present

## 2016-09-02 DIAGNOSIS — Z96652 Presence of left artificial knee joint: Secondary | ICD-10-CM | POA: Diagnosis not present

## 2016-09-03 DIAGNOSIS — Z96653 Presence of artificial knee joint, bilateral: Secondary | ICD-10-CM | POA: Diagnosis not present

## 2016-09-03 DIAGNOSIS — I10 Essential (primary) hypertension: Secondary | ICD-10-CM | POA: Diagnosis not present

## 2016-09-03 DIAGNOSIS — E890 Postprocedural hypothyroidism: Secondary | ICD-10-CM | POA: Diagnosis not present

## 2016-09-03 DIAGNOSIS — Z7982 Long term (current) use of aspirin: Secondary | ICD-10-CM | POA: Diagnosis not present

## 2016-09-03 DIAGNOSIS — Z471 Aftercare following joint replacement surgery: Secondary | ICD-10-CM | POA: Diagnosis not present

## 2016-09-03 DIAGNOSIS — Z79891 Long term (current) use of opiate analgesic: Secondary | ICD-10-CM | POA: Diagnosis not present

## 2016-09-03 DIAGNOSIS — F329 Major depressive disorder, single episode, unspecified: Secondary | ICD-10-CM | POA: Diagnosis not present

## 2016-09-03 DIAGNOSIS — Z981 Arthrodesis status: Secondary | ICD-10-CM | POA: Diagnosis not present

## 2016-09-05 DIAGNOSIS — M1712 Unilateral primary osteoarthritis, left knee: Secondary | ICD-10-CM | POA: Diagnosis not present

## 2016-09-11 ENCOUNTER — Ambulatory Visit (HOSPITAL_COMMUNITY): Payer: PPO

## 2016-09-12 ENCOUNTER — Encounter (HOSPITAL_COMMUNITY): Payer: Self-pay | Admitting: Physical Therapy

## 2016-09-12 ENCOUNTER — Ambulatory Visit (HOSPITAL_COMMUNITY): Payer: PPO | Attending: Orthopedic Surgery | Admitting: Physical Therapy

## 2016-09-12 DIAGNOSIS — M25562 Pain in left knee: Secondary | ICD-10-CM | POA: Diagnosis not present

## 2016-09-12 DIAGNOSIS — M6281 Muscle weakness (generalized): Secondary | ICD-10-CM

## 2016-09-12 DIAGNOSIS — M25662 Stiffness of left knee, not elsewhere classified: Secondary | ICD-10-CM | POA: Insufficient documentation

## 2016-09-12 DIAGNOSIS — R2689 Other abnormalities of gait and mobility: Secondary | ICD-10-CM

## 2016-09-12 NOTE — Therapy (Signed)
[place], [place], [address] Phone: [phone]   Fax:  [phone]  Physical Therapy Evaluation  Patient Details  Name: [name] MRN: [id number] Date of Birth: 02-01-1946 Referring Provider: Roxan Hockey, PA-C  Encounter Date: 09/12/2016      PT End of Session - 09/12/16 1427    Visit Number 1   Number of Visits 8   Date for PT Re-Evaluation 10/10/16   Authorization Type [employer]   Authorization Time Period 09/12/16 to 10/10/16   PT Start Time 1311  due to front desk delay   PT Stop Time 1346   PT Time Calculation (min) 35 min   Activity Tolerance Patient tolerated treatment well;No increased pain   Behavior During Therapy WFL for tasks assessed/performed      Past Medical History:  Diagnosis Date  . Arthritis   . Complication of anesthesia    "pt. holds her breath when waking up"  . GERD (gastroesophageal reflux disease)   . Hypertension   . Hypothyroidism   . PONV (postoperative nausea and vomiting)   . Staph infection    abdomen and returned 5 yrs. later    Past Surgical History:  Procedure Laterality Date  . ABDOMINAL HYSTERECTOMY    . CERVICAL FUSION  1990's  . COLON SURGERY    . FOOT SURGERY Left    reshaped  . HERNIA REPAIR    . JOINT REPLACEMENT Right    hip  . LUMBAR SPINE SURGERY    . REPLACEMENT TOTAL KNEE Right   . THYROID SURGERY    . TOTAL KNEE ARTHROPLASTY Left 08/21/2016   Procedure: LEFT TOTAL KNEE ARTHROPLASTY;  Surgeon: Renette Butters, MD;  Location: [facility];  Service: Orthopedics;  Laterality: Left;    There were no vitals filed for this visit.       Subjective Assessment - 09/12/16 1313    Subjective Pt reports undergoing Lt TKA on 08/21/16. She had 9 visits of HHPT and states that the therapist was pleased with her progress. She has been performing her exercises as well. Her knee is doing wonderful with only occasional pain. She has not had to  take any pain medication for the past couple of days.    Pertinent History Arthritis, GERD, HTN, Cx fusion, Rt TKA 2001   How long can you sit comfortably? 1 hour   How long can you stand comfortably? 30 minutes    How long can you walk comfortably? 1 hour   Patient Stated Goals improve walking and posture   Currently in Pain? No/denies            [facility] PT Assessment - 09/12/16 0001      Assessment   Medical Diagnosis s/p Lt TKA   Referring Provider Roxan Hockey, PA-C   Onset Date/Surgical Date 08/21/16   Next MD Visit 09/20/16   Prior Therapy 9 visits of HHPT     Precautions   Precautions None     Balance Screen   Has the patient fallen in the past 6 months Yes   How many times? only 1-2 before the surgery but none since the surgery   Has the patient had a decrease in activity level because of a fear of falling?  No   Is the patient reluctant to leave their home because of a fear of falling?  No     Home Ecologist residence   Living Arrangements Parent  Additional Comments ramped entrance      Prior Function   Level of Independence Independent   Leisure Currently living with her mother to provide company      Observation/Other Assessments   Focus on Therapeutic Outcomes (FOTO)  54% limited      Sensation   Light Touch Appears Intact   Additional Comments only numbness around surgical incision     ROM / Strength   AROM / PROM / Strength AROM;Strength     AROM   AROM Assessment Site Knee   Right/Left Knee Right;Left   Right Knee Extension 0   Right Knee Flexion 110   Left Knee Extension 0   Left Knee Flexion 115     Strength   Strength Assessment Site Hip;Knee;Ankle   Right/Left Hip Right;Left   Right Hip Flexion 4/5   Right Hip ABduction 3+/5   Left Hip Flexion 4/5   Left Hip ABduction 3+/5   Right/Left Knee Right;Left   Right Knee Flexion 5/5   Right Knee Extension 5/5   Left Knee Flexion 5/5   Left Knee Extension  5/5   Right/Left Ankle Left;Right   Right Ankle Dorsiflexion 5/5   Left Ankle Dorsiflexion 5/5     Palpation   Palpation comment scar adhesions noted with palpation     Transfers   Five time sit to stand comments  13.6 sec, no UE support, minimal weight shift Rt     Ambulation/Gait   Gait Comments wide BOS, ambulating into clinic with RW but able to ambulate around clinic without AD.      Standardized Balance Assessment   Standardized Balance Assessment Timed Up and Go Test     Timed Up and Go Test   TUG Comments 13 sec, no AD     High Level Balance   High Level Balance Comments SLS: unable on each x3 trials                    [facility] Adult PT Treatment/Exercise - 09/12/16 0001      Exercises   Exercises Knee/Hip     Knee/Hip Exercises: Standing   Other Standing Knee Exercises tandem hold with each LE forward x20 sec for HEP demo     Knee/Hip Exercises: Seated   Sit to Sand 5 reps;1 set;without UE support;Other (comment)  red TB around knees, HEP demo                 PT Education - 09/12/16 1426    Education provided Yes   Education Details eval findings/POC; implemented HEP   Person(s) Educated Patient   Methods Explanation;Handout;Verbal cues   Comprehension Returned demonstration;Verbalized understanding          PT Short Term Goals - 09/12/16 1437      PT SHORT TERM GOAL #1   Title Pt will demo consistency and independence with her HEP to improve LE strength and proprioception.    Time 1   Period Weeks   Status New           PT Long Term Goals - 09/12/16 1438      PT LONG TERM GOAL #1   Title Pt will demo improved BLE strength to atleast 4/5 MMT which will improve stability and safety with functional activity.    Time 4   Period Weeks   Status New     PT LONG TERM GOAL #2   Title Pt will maintain single leg balance on each LE for  atleast 15 sec without LOB, 2/3 trials, to improve safety with ambulation without an AD.   Time  4   Period Weeks   Status New     PT LONG TERM GOAL #3   Title Pt will complete 5x sit to stand in less than 10 sec, without UE support or weight shift, to demonstrate an improvement in her functional strength and power.   Time 4   Period Weeks   Status New               Plan - 09-29-2016 1429    Clinical Impression Statement Pt is a pleasant 71 yo F referred to OPPT s/p Lt TKA on 08/21/16. She had several appointments with OPPT and arrives having made excellent progress. She demonstrates good knee ROM on the Lt from 0 to 115 deg. Her overall strength is good, with noted limitations in hip abductor strength primarily. Her performance on functional testing such as 5x sit to stand and TUG were within age expected ranges, however her single leg balance is poor on either LE. She is highly motivated to ambulate without an AD and would benefit from skilled PT to address her limitations in LE strength and proprioception which will increase her independence at home. Eval findings were discussed with the pt who was in agreement with proposed PT POC and frequency. HEP was implemented with return demonstration of proper technique.    Rehab Potential Good   Clinical Impairments Affecting Rehab Potential (+) highly motivated   PT Frequency 2x / week   PT Duration 4 weeks   PT Treatment/Interventions ADLs/Self Care Home Management;Cryotherapy;Moist Heat;Gait training;Neuromuscular re-education;Balance training;Therapeutic exercise;Therapeutic activities;Functional mobility training;Stair training;Patient/family education;Manual techniques;Passive range of motion;Scar mobilization   PT Next Visit Plan hip abductor strengthening, static balance activity    PT Home Exercise Plan sit to stand with red TB around knees 3x5, tandem hold 3x20 sec    Recommended Other Services none    Consulted and Agree with Plan of Care Patient      Patient will benefit from skilled therapeutic intervention in order to  improve the following deficits and impairments:  Abnormal gait, Decreased balance, Decreased activity tolerance, Difficulty walking, Decreased strength, Decreased range of motion, Decreased scar mobility, Pain, Improper body mechanics  Visit Diagnosis: Acute pain of left knee  Stiffness of left knee, not elsewhere classified  Muscle weakness (generalized)  Other abnormalities of gait and mobility      G-Codes - 09-29-2016 1443    Functional Assessment Tool Used (Outpatient Only) clnical judgement based on assessment of ROM, strength and balance    Functional Limitation Mobility: Walking and moving around   Mobility: Walking and Moving Around Current Status (C3762) At least 20 percent but less than 40 percent impaired, limited or restricted   Mobility: Walking and Moving Around Goal Status [phone]) At least 1 percent but less than 20 percent impaired, limited or restricted       Problem List Patient Active Problem List   Diagnosis Date Noted  . Primary localized osteoarthritis of left knee 08/22/2016  . Paresthesia 08/10/2016  . Weakness 08/10/2016  . Depression 07/31/2016  . S/P thyroidectomy 07/31/2016  . Primary osteoarthritis of left knee 07/31/2016  . S/P cervical spinal fusion 07/31/2016  . S/P total knee arthroplasty, right 07/31/2016  . SUBLUXATION, RADIAL HEAD 10/27/2009  . CONTUSION, ELBOW 10/27/2009   2:48 PM,Sep 29, 2016 Elly Modena PT, DPT [facility] [place] [address] [place], [place], [address] Phone: [phone]   Fax:  [phone]  Name: [name] MRN: [id number] Date of Birth: July 14, 1945

## 2016-09-14 ENCOUNTER — Ambulatory Visit (HOSPITAL_COMMUNITY): Payer: PPO

## 2016-09-14 DIAGNOSIS — M25562 Pain in left knee: Secondary | ICD-10-CM

## 2016-09-14 DIAGNOSIS — M25662 Stiffness of left knee, not elsewhere classified: Secondary | ICD-10-CM

## 2016-09-14 DIAGNOSIS — R2689 Other abnormalities of gait and mobility: Secondary | ICD-10-CM

## 2016-09-14 DIAGNOSIS — M6281 Muscle weakness (generalized): Secondary | ICD-10-CM

## 2016-09-14 NOTE — Therapy (Signed)
[place], [place], [address] Phone: [phone]   Fax:  [phone]  Physical Therapy Treatment  Patient Details  Name: [name] MRN: [id number] Date of Birth: 09/15/1945 Referring Provider: Roxan Hockey, PA-C  Encounter Date: 09/14/2016      PT End of Session - 09/14/16 1449    Visit Number 2   Number of Visits 8   Date for PT Re-Evaluation 10/10/16   Authorization Type [employer]   Authorization Time Period 09/12/16 to 10/10/16   PT Start Time 1433   PT Stop Time 1513   PT Time Calculation (min) 40 min   Activity Tolerance Patient tolerated treatment well;No increased pain   Behavior During Therapy WFL for tasks assessed/performed      Past Medical History:  Diagnosis Date  . Arthritis   . Complication of anesthesia    "pt. holds her breath when waking up"  . GERD (gastroesophageal reflux disease)   . Hypertension   . Hypothyroidism   . PONV (postoperative nausea and vomiting)   . Staph infection    abdomen and returned 5 yrs. later    Past Surgical History:  Procedure Laterality Date  . ABDOMINAL HYSTERECTOMY    . CERVICAL FUSION  1990's  . COLON SURGERY    . FOOT SURGERY Left    reshaped  . HERNIA REPAIR    . JOINT REPLACEMENT Right    hip  . LUMBAR SPINE SURGERY    . REPLACEMENT TOTAL KNEE Right   . THYROID SURGERY    . TOTAL KNEE ARTHROPLASTY Left 08/21/2016   Procedure: LEFT TOTAL KNEE ARTHROPLASTY;  Surgeon: Renette Butters, MD;  Location: [facility];  Service: Orthopedics;  Laterality: Left;    There were no vitals filed for this visit.      Subjective Assessment - 09/14/16 1435    Subjective Pt reports she is doing well thus far. She says one of her exercises is going well but otherwise, th eotherone is more difficult.    Currently in Pain? Yes   Pain Score 5    Pain Orientation Left   Pain Descriptors / Indicators --  very deeply located achiness                           [facility] Adult PT Treatment/Exercise - 09/14/16 0001      Transfers   Five time sit to stand comments  14s     Ambulation/Gait   Gait velocity 0.91m/s    Gait Comments wide BOS, bilat compensated trendelenburg (chronic)      Knee/Hip Exercises: Standing   Other Standing Knee Exercises *semit tandem stance balance   3x20sec bilat; (pt unable to perform  tandem >5sec)      Knee/Hip Exercises: Seated   Abd/Adduction Weights --  Hip IR with band, ball btwn knees: 2x10 redTB   Sit to Sand 5 reps;without UE support;3 sets  red TB around knees, (~14s)      Knee/Hip Exercises: Supine   Bridges with Ball Squeeze Strengthening;Both;2 sets  Gute max bridge (feet wide and close to buttcoks)                   PT Short Term Goals - 09/12/16 1437      PT SHORT TERM GOAL #1   Title Pt will demo consistency and independence with her HEP to improve LE strength and proprioception.    Time  1   Period Weeks   Status New           PT Long Term Goals - 09/12/16 1438      PT LONG TERM GOAL #1   Title Pt will demo improved BLE strength to atleast 4/5 MMT which will improve stability and safety with functional activity.    Time 4   Period Weeks   Status New     PT LONG TERM GOAL #2   Title Pt will maintain single leg balance on each LE for atleast 15 sec without LOB, 2/3 trials, to improve safety with ambulation without an AD.   Time 4   Period Weeks   Status New     PT LONG TERM GOAL #3   Title Pt will complete 5x sit to stand in less than 10 sec, without UE support or weight shift, to demonstrate an improvement in her functional strength and power.   Time 4   Period Weeks   Status New               Plan - 09/14/16 1450    Clinical Impression Statement Reviewed goals and examinaton findings this session, patient agreeable. Pt noted to have continued poor control of her hips during exercises and simple mobility. Noted audible  popping sound of Right hip twice during stepping strategy after LOB; pt reports this to be an ongoing issue on the Right side (s/p THA) for the last 1 year; THA is 69 years old. Of note patient reports ortho is aware of this and not concerned. Pt also had one major LOB during side stepping, self corrected, due to fatigue and insufficiency in the Rt hip abductors. Pt making good progres toward goals. Continued emphasis on hip strengthening and Quads Strengthening.    Rehab Potential Good   Clinical Impairments Affecting Rehab Potential (+) highly motivated   PT Frequency 2x / week   PT Duration 4 weeks   PT Treatment/Interventions ADLs/Self Care Home Management;Cryotherapy;Moist Heat;Gait training;Neuromuscular re-education;Balance training;Therapeutic exercise;Therapeutic activities;Functional mobility training;Stair training;Patient/family education;Manual techniques;Passive range of motion;Scar mobilization   PT Next Visit Plan hip abductor strengthening (hip precautions)l; hip IR strengthening; Quads strengthening   PT Home Exercise Plan sit to stand with red TB around knees 3x5, tandem hold 3x20 sec; Seated IR with band ball at knees; Glute max bridge with ball squeeze 2x10    Consulted and Agree with Plan of Care Patient      Patient will benefit from skilled therapeutic intervention in order to improve the following deficits and impairments:  Abnormal gait, Decreased balance, Decreased activity tolerance, Difficulty walking, Decreased strength, Decreased range of motion, Decreased scar mobility, Pain, Improper body mechanics  Visit Diagnosis: Acute pain of left knee  Stiffness of left knee, not elsewhere classified  Muscle weakness (generalized)  Other abnormalities of gait and mobility     Problem List Patient Active Problem List   Diagnosis Date Noted  . Primary localized osteoarthritis of left knee 08/22/2016  . Paresthesia 08/10/2016  . Weakness 08/10/2016  . Depression  07/31/2016  . S/P thyroidectomy 07/31/2016  . Primary osteoarthritis of left knee 07/31/2016  . S/P cervical spinal fusion 07/31/2016  . S/P total knee arthroplasty, right 07/31/2016  . SUBLUXATION, RADIAL HEAD 10/27/2009  . CONTUSION, ELBOW 10/27/2009    3:16 PM, 09/14/16 Etta Grandchild, PT, DPT Physical Therapist at [facility] [phone] (office)    [facility] [address]  [place], [place], [address] Phone: [phone]   Fax:  [phone]  Name: [name] MRN: [id number] Date of Birth: 06/23/1945

## 2016-09-17 ENCOUNTER — Ambulatory Visit (HOSPITAL_COMMUNITY): Payer: PPO | Admitting: Physical Therapy

## 2016-09-17 DIAGNOSIS — M25562 Pain in left knee: Secondary | ICD-10-CM | POA: Diagnosis not present

## 2016-09-17 DIAGNOSIS — M25662 Stiffness of left knee, not elsewhere classified: Secondary | ICD-10-CM

## 2016-09-17 DIAGNOSIS — R2689 Other abnormalities of gait and mobility: Secondary | ICD-10-CM

## 2016-09-17 DIAGNOSIS — M6281 Muscle weakness (generalized): Secondary | ICD-10-CM

## 2016-09-17 NOTE — Therapy (Signed)
[place], [place], [address] Phone: [phone]   Fax:  [phone]  Physical Therapy Treatment  Patient Details  Name: [name] MRN: [id number] Date of Birth: 22-Dec-1945 Referring Provider: Roxan Hockey, PA-C  Encounter Date: 09/17/2016      PT End of Session - 09/17/16 1431    Visit Number 3   Number of Visits 8   Date for PT Re-Evaluation 10/10/16   Authorization Type [employer]   Authorization Time Period 09/12/16 to 10/10/16   PT Start Time 1346   PT Stop Time 1428   PT Time Calculation (min) 42 min   Activity Tolerance Patient tolerated treatment well;No increased pain   Behavior During Therapy WFL for tasks assessed/performed      Past Medical History:  Diagnosis Date  . Arthritis   . Complication of anesthesia    "pt. holds her breath when waking up"  . GERD (gastroesophageal reflux disease)   . Hypertension   . Hypothyroidism   . PONV (postoperative nausea and vomiting)   . Staph infection    abdomen and returned 5 yrs. later    Past Surgical History:  Procedure Laterality Date  . ABDOMINAL HYSTERECTOMY    . CERVICAL FUSION  1990's  . COLON SURGERY    . FOOT SURGERY Left    reshaped  . HERNIA REPAIR    . JOINT REPLACEMENT Right    hip  . LUMBAR SPINE SURGERY    . REPLACEMENT TOTAL KNEE Right   . THYROID SURGERY    . TOTAL KNEE ARTHROPLASTY Left 08/21/2016   Procedure: LEFT TOTAL KNEE ARTHROPLASTY;  Surgeon: Renette Butters, MD;  Location: [facility];  Service: Orthopedics;  Laterality: Left;    There were no vitals filed for this visit.      Subjective Assessment - 09/17/16 1349    Subjective Pt reports that things are going well. She has had alot of visitors so it can be tough to do all of her exercises with this going on. Otherwise, no complaints.    Currently in Pain? No/denies                         [facility] Adult PT Treatment/Exercise -  09/17/16 0001      Knee/Hip Exercises: Standing   Other Standing Knee Exercises hip hikes from 4" box x10 reps each      Knee/Hip Exercises: Seated   Long Arc Quad Left;2 sets   Long Arc Quad Weight 10 lbs.   Abduction/Adduction  Both;1 set;15 reps   Abd/Adduction Limitations blue TB      Knee/Hip Exercises: Supine   Bridges with Ball Squeeze Both;2 sets;10 reps   Other Supine Knee/Hip Exercises straight leg bridge 2x10 reps on 6" box with foam             Balance Exercises - 09/17/16 1410      Balance Exercises: Standing   Standing Eyes Opened Narrow base of support (BOS);Other (comment)  trunk rotation Lt/Rt x10 reps   Tandem Stance 2 reps;Eyes open;20 secs   SLS 2 reps;15 secs;Eyes open;Other reps (comment)  CGA (+) trendelenburg on the Rt   Sidestepping 3 reps  3 RT x20 ft each direction           PT Education - 09/17/16 1431    Education provided Yes   Education Details technique with therex    Person(s) Educated Patient  Methods Explanation;Demonstration;Verbal cues   Comprehension Verbalized understanding;Returned demonstration          PT Short Term Goals - 09/12/16 1437      PT SHORT TERM GOAL #1   Title Pt will demo consistency and independence with her HEP to improve LE strength and proprioception.    Time 1   Period Weeks   Status New           PT Long Term Goals - 09/12/16 1438      PT LONG TERM GOAL #1   Title Pt will demo improved BLE strength to atleast 4/5 MMT which will improve stability and safety with functional activity.    Time 4   Period Weeks   Status New     PT LONG TERM GOAL #2   Title Pt will maintain single leg balance on each LE for atleast 15 sec without LOB, 2/3 trials, to improve safety with ambulation without an AD.   Time 4   Period Weeks   Status New     PT LONG TERM GOAL #3   Title Pt will complete 5x sit to stand in less than 10 sec, without UE support or weight shift, to demonstrate an improvement in  her functional strength and power.   Time 4   Period Weeks   Status New               Plan - 09/17/16 1431    Clinical Impression Statement Today's session continued with LE strengthening and balance activity to improve pt's independence and safety at home. She was able to complete all exercises with increased reps and resistance, reporting no increase in pain or fatigue. Ended the session with balance activity, noting a significant improvement in tandem balance, able to hold up to 20 sec on each side without LOB. No additions were made to her HEP at this time. Will continue with current POC.   Rehab Potential Good   Clinical Impairments Affecting Rehab Potential (+) highly motivated   PT Frequency 2x / week   PT Duration 4 weeks   PT Treatment/Interventions ADLs/Self Care Home Management;Cryotherapy;Moist Heat;Gait training;Neuromuscular re-education;Balance training;Therapeutic exercise;Therapeutic activities;Functional mobility training;Stair training;Patient/family education;Manual techniques;Passive range of motion;Scar mobilization   PT Next Visit Plan hip abductor strengthening (isometrics as well); balance activity   PT Home Exercise Plan sit to stand with red TB around knees 3x5, tandem hold 3x20 sec; Seated IR with band ball at knees; Glute max bridge with ball squeeze 2x10    Consulted and Agree with Plan of Care Patient      Patient will benefit from skilled therapeutic intervention in order to improve the following deficits and impairments:  Abnormal gait, Decreased balance, Decreased activity tolerance, Difficulty walking, Decreased strength, Decreased range of motion, Decreased scar mobility, Pain, Improper body mechanics  Visit Diagnosis: Acute pain of left knee  Stiffness of left knee, not elsewhere classified  Muscle weakness (generalized)  Other abnormalities of gait and mobility     Problem List Patient Active Problem List   Diagnosis Date Noted  .  Primary localized osteoarthritis of left knee 08/22/2016  . Paresthesia 08/10/2016  . Weakness 08/10/2016  . Depression 07/31/2016  . S/P thyroidectomy 07/31/2016  . Primary osteoarthritis of left knee 07/31/2016  . S/P cervical spinal fusion 07/31/2016  . S/P total knee arthroplasty, right 07/31/2016  . SUBLUXATION, RADIAL HEAD 10/27/2009  . CONTUSION, ELBOW 10/27/2009   2:35 PM,09/17/16 Elly Modena PT, DPT [facility] [phone]  [place], [place], [address] Phone: [phone]   Fax:  [phone]  Name: RAISSA DAM MRN: [id number] Date of Birth: [birth year]

## 2016-09-19 ENCOUNTER — Ambulatory Visit (HOSPITAL_COMMUNITY): Payer: PPO | Admitting: Physical Therapy

## 2016-09-19 DIAGNOSIS — M25562 Pain in left knee: Secondary | ICD-10-CM

## 2016-09-19 DIAGNOSIS — M1712 Unilateral primary osteoarthritis, left knee: Secondary | ICD-10-CM | POA: Diagnosis not present

## 2016-09-19 DIAGNOSIS — R2689 Other abnormalities of gait and mobility: Secondary | ICD-10-CM

## 2016-09-19 DIAGNOSIS — M25662 Stiffness of left knee, not elsewhere classified: Secondary | ICD-10-CM

## 2016-09-19 DIAGNOSIS — M6281 Muscle weakness (generalized): Secondary | ICD-10-CM

## 2016-09-19 NOTE — Therapy (Signed)
[facility] [place], [place], [address] Phone: [phone]   Fax:  [phone]  Physical Therapy Treatment/Reassessment   Patient Details  Name: [name] MRN: [id number] Date of Birth: [birth year] Referring Provider: Roxan Hockey, PA-C  Encounter Date: 09/19/2016      PT End of Session - 09/19/16 1056    Visit Number 4   Number of Visits 8   Date for PT Re-Evaluation 10/10/16   Authorization Type [employer]   Authorization Time Period 09/12/16 to 10/10/16   PT Start Time 1032   PT Stop Time 1114   PT Time Calculation (min) 42 min   Equipment Utilized During Treatment Gait belt   Activity Tolerance Patient tolerated treatment well;No increased pain   Behavior During Therapy WFL for tasks assessed/performed      Past Medical History:  Diagnosis Date  . Arthritis   . Complication of anesthesia    "pt. holds her breath when waking up"  . GERD (gastroesophageal reflux disease)   . Hypertension   . Hypothyroidism   . PONV (postoperative nausea and vomiting)   . Staph infection    abdomen and returned 5 yrs. later    Past Surgical History:  Procedure Laterality Date  . ABDOMINAL HYSTERECTOMY    . CERVICAL FUSION  1990's  . COLON SURGERY    . FOOT SURGERY Left    reshaped  . HERNIA REPAIR    . JOINT REPLACEMENT Right    hip  . LUMBAR SPINE SURGERY    . REPLACEMENT TOTAL KNEE Right   . THYROID SURGERY    . TOTAL KNEE ARTHROPLASTY Left 08/21/2016   Procedure: LEFT TOTAL KNEE ARTHROPLASTY;  Surgeon: Renette Butters, MD;  Location: [facility];  Service: Orthopedics;  Laterality: Left;    There were no vitals filed for this visit.      Subjective Assessment - 09/19/16 1033    Subjective Pt reports that things are doing great. Her power was out over the past couple of day, so she was not able ice her knee. Now that her power is back on, she has been able to ice her knee.    How long can you sit  comfortably? unlimited    How long can you stand comfortably? 1 hour    How long can you walk comfortably? 1 hour    Patient Stated Goals improve walking and posture   Currently in Pain? No/denies            [facility] PT Assessment - 09/19/16 0001      Strength   Right Hip Flexion 4+/5   Right Hip ABduction 4/5   Left Hip Flexion 4+/5   Left Hip ABduction 3+/5                     [facility] Adult PT Treatment/Exercise - 09/19/16 0001      Knee/Hip Exercises: Standing   Other Standing Knee Exercises side stepping over 14" hurdle Lt/Rt x10 reps with UE support    Other Standing Knee Exercises side stepping x60 ft, x4 trials with red TB around ankles for last 2 trials     Knee/Hip Exercises: Supine   Other Supine Knee/Hip Exercises hip abduction Lt only x15 reps   VCs to prevent hip ER compensation     Knee/Hip Exercises: Sidelying   Hip ABduction Left;2 sets;10 reps;AAROM   Hip ABduction Limitations against wall  Balance Exercises - 09/19/16 1206      Balance Exercises: Standing   SLS Solid surface;2 reps;20 secs   Tandem Gait 3 reps;Forward           PT Education - 09/19/16 1054    Education provided Yes   Education Details reviewed goals and progress briefly at beginning of the session   Person(s) Educated Patient   Methods Explanation   Comprehension Verbalized understanding          PT Short Term Goals - 09/19/16 1035      PT SHORT TERM GOAL #1   Title Pt will demo consistency and independence with her HEP to improve LE strength and proprioception.    Time 1   Period Weeks   Status On-going           PT Long Term Goals - 09/19/16 1036      PT LONG TERM GOAL #1   Title Pt will demo improved BLE strength to atleast 4/5 MMT which will improve stability and safety with functional activity.    Baseline hip abductors weak   Time 4   Period Weeks   Status Partially Met     PT LONG TERM GOAL #2   Title Pt will maintain  single leg balance on each LE for atleast 15 sec without LOB, 2/3 trials, to improve safety with ambulation without an AD.   Baseline 1/3 trials    Time 4   Period Weeks   Status Partially Met     PT LONG TERM GOAL #3   Title Pt will complete 5x sit to stand in less than 10 sec, without UE support or weight shift, to demonstrate an improvement in her functional strength and power.   Baseline 8.7 sec, no UE support    Time 4   Period Weeks   Status Achieved               Plan - 09/19/16 1056    Clinical Impression Statement Pt's strength and goals were reassessed this visit having made notable progress towards her goals. She is able to maintain SLS up to 15 sec with variable success, however this is much improved from her initial evaluation. Remaining deficits include single leg and dynamic balance as well as hip abductor strength. HEP updates were made to further address these noted impairments and pt was able to demonstrate proper technique. Will continue with current POC.    Rehab Potential Good   Clinical Impairments Affecting Rehab Potential (+) highly motivated   PT Frequency 2x / week   PT Duration 4 weeks   PT Treatment/Interventions ADLs/Self Care Home Management;Cryotherapy;Moist Heat;Gait training;Neuromuscular re-education;Balance training;Therapeutic exercise;Therapeutic activities;Functional mobility training;Stair training;Patient/family education;Manual techniques;Passive range of motion;Scar mobilization   PT Next Visit Plan continue hip abductor strengthening (isometrics as well); dynamic balance activity   PT Home Exercise Plan sit to stand with red TB around knees 3x5, tandem hold 3x20 sec; Seated IR with band ball at knees; Glute max bridge with ball squeeze 2x10    Consulted and Agree with Plan of Care Patient      Patient will benefit from skilled therapeutic intervention in order to improve the following deficits and impairments:  Abnormal gait, Decreased  balance, Decreased activity tolerance, Difficulty walking, Decreased strength, Decreased range of motion, Decreased scar mobility, Pain, Improper body mechanics  Visit Diagnosis: Acute pain of left knee  Stiffness of left knee, not elsewhere classified  Muscle weakness (generalized)  Other abnormalities of gait and  mobility     Problem List Patient Active Problem List   Diagnosis Date Noted  . Primary localized osteoarthritis of left knee 08/22/2016  . Paresthesia 08/10/2016  . Weakness 08/10/2016  . Depression 07/31/2016  . S/P thyroidectomy 07/31/2016  . Primary osteoarthritis of left knee 07/31/2016  . S/P cervical spinal fusion 07/31/2016  . S/P total knee arthroplasty, right 07/31/2016  . SUBLUXATION, RADIAL HEAD 10/27/2009  . CONTUSION, ELBOW 10/27/2009    12:10 PM,09/19/16 Elly Modena PT, DPT [facility] [address] [place], [place], [address] Phone: [phone]   Fax:  [phone]  Name: [name] MRN: [id number] Date of Birth: 1945/08/30

## 2016-09-21 ENCOUNTER — Ambulatory Visit (HOSPITAL_COMMUNITY): Payer: PPO

## 2016-09-24 ENCOUNTER — Ambulatory Visit (HOSPITAL_COMMUNITY): Payer: PPO | Admitting: Physical Therapy

## 2016-09-24 DIAGNOSIS — R2689 Other abnormalities of gait and mobility: Secondary | ICD-10-CM

## 2016-09-24 DIAGNOSIS — M25662 Stiffness of left knee, not elsewhere classified: Secondary | ICD-10-CM

## 2016-09-24 DIAGNOSIS — M6281 Muscle weakness (generalized): Secondary | ICD-10-CM

## 2016-09-24 DIAGNOSIS — M25562 Pain in left knee: Secondary | ICD-10-CM | POA: Diagnosis not present

## 2016-09-24 NOTE — Therapy (Signed)
[place], [place], [address] Phone: [phone]   Fax:  [phone]  Physical Therapy Treatment  Patient Details  Name: [name] MRN: [id number] Date of Birth: 03-12-1946 Referring Provider: Roxan Hockey, PA-C  Encounter Date: 09/24/2016      PT End of Session - 09/24/16 1346    Visit Number 5   Number of Visits 8   Date for PT Re-Evaluation 10/10/16   Authorization Type [employer]   Authorization Time Period 09/12/16 to 10/10/16   PT Start Time 1306   PT Stop Time 1346   PT Time Calculation (min) 40 min   Equipment Utilized During Treatment Gait belt   Activity Tolerance Patient tolerated treatment well;No increased pain   Behavior During Therapy WFL for tasks assessed/performed      Past Medical History:  Diagnosis Date  . Arthritis   . Complication of anesthesia    "pt. holds her breath when waking up"  . GERD (gastroesophageal reflux disease)   . Hypertension   . Hypothyroidism   . PONV (postoperative nausea and vomiting)   . Staph infection    abdomen and returned 5 yrs. later    Past Surgical History:  Procedure Laterality Date  . ABDOMINAL HYSTERECTOMY    . CERVICAL FUSION  1990's  . COLON SURGERY    . FOOT SURGERY Left    reshaped  . HERNIA REPAIR    . JOINT REPLACEMENT Right    hip  . LUMBAR SPINE SURGERY    . REPLACEMENT TOTAL KNEE Right   . THYROID SURGERY    . TOTAL KNEE ARTHROPLASTY Left 08/21/2016   Procedure: LEFT TOTAL KNEE ARTHROPLASTY;  Surgeon: Renette Butters, MD;  Location: [facility];  Service: Orthopedics;  Laterality: Left;    There were no vitals filed for this visit.      Subjective Assessment - 09/24/16 1322    Subjective Pt states she is doing her HEP.   States she is is having pain in her lower back at 7/10 and unsure what is causing it other than maybe working in her garden Saturday and making lunch for 20+ people yesterday.     Currently in  Pain? Yes   Pain Score 7    Pain Location Back   Pain Orientation Lower;Mid   Pain Descriptors / Indicators Aching   Pain Type Chronic pain                         [facility] Adult PT Treatment/Exercise - 09/24/16 0001      Knee/Hip Exercises: Standing   Heel Raises 10 reps   Heel Raises Limitations toerasies 10 rpes   Gait Training to wall and back using mirror for biofeedback working on increasing weight and stance on LT    Other Standing Knee Exercises tandem gait 2RT   Other Standing Knee Exercises side stepping x60 ft, x4 RT     Knee/Hip Exercises: Seated   Sit to Sand 10 reps;without UE support  Lt LE staggered back     Knee/Hip Exercises: Sidelying   Clams 10 X 3" Lt only                  PT Short Term Goals - 09/19/16 1035      PT SHORT TERM GOAL #1   Title Pt will demo consistency and independence with her HEP to improve LE strength and proprioception.    Time  1   Period Weeks   Status On-going           PT Long Term Goals - 09/19/16 1036      PT LONG TERM GOAL #1   Title Pt will demo improved BLE strength to atleast 4/5 MMT which will improve stability and safety with functional activity.    Baseline hip abductors weak   Time 4   Period Weeks   Status Partially Met     PT LONG TERM GOAL #2   Title Pt will maintain single leg balance on each LE for atleast 15 sec without LOB, 2/3 trials, to improve safety with ambulation without an AD.   Baseline 1/3 trials    Time 4   Period Weeks   Status Partially Met     PT LONG TERM GOAL #3   Title Pt will complete 5x sit to stand in less than 10 sec, without UE support or weight shift, to demonstrate an improvement in her functional strength and power.   Baseline 8.7 sec, no UE support    Time 4   Period Weeks   Status Achieved               Plan - 09/24/16 1346    Clinical Impression Statement Continued with focus on improving Lt glute strength, overall functional strength  and balance.  Worked on gait wtih equal weight bearing/stride.  Use of mirror for biofeedback for self correction and awareness.  Added sidelying clams as patient only able to complete minimal ROM with extended LE abduction due to weakness.  Pt with noted fatigue at end of session.    Rehab Potential Good   Clinical Impairments Affecting Rehab Potential (+) highly motivated   PT Frequency 2x / week   PT Duration 4 weeks   PT Treatment/Interventions ADLs/Self Care Home Management;Cryotherapy;Moist Heat;Gait training;Neuromuscular re-education;Balance training;Therapeutic exercise;Therapeutic activities;Functional mobility training;Stair training;Patient/family education;Manual techniques;Passive range of motion;Scar mobilization   PT Next Visit Plan continue hip abductor strengthening (isometrics as well); dynamic balance activity.  Next session add theraband to side stepping.   PT Home Exercise Plan sit to stand with red TB around knees 3x5, tandem hold 3x20 sec; Seated IR with band ball at knees; Glute max bridge with ball squeeze 2x10    Consulted and Agree with Plan of Care Patient      Patient will benefit from skilled therapeutic intervention in order to improve the following deficits and impairments:  Abnormal gait, Decreased balance, Decreased activity tolerance, Difficulty walking, Decreased strength, Decreased range of motion, Decreased scar mobility, Pain, Improper body mechanics  Visit Diagnosis: Acute pain of left knee  Stiffness of left knee, not elsewhere classified  Muscle weakness (generalized)  Other abnormalities of gait and mobility     Problem List Patient Active Problem List   Diagnosis Date Noted  . Primary localized osteoarthritis of left knee 08/22/2016  . Paresthesia 08/10/2016  . Weakness 08/10/2016  . Depression 07/31/2016  . S/P thyroidectomy 07/31/2016  . Primary osteoarthritis of left knee 07/31/2016  . S/P cervical spinal fusion 07/31/2016  . S/P  total knee arthroplasty, right 07/31/2016  . SUBLUXATION, RADIAL HEAD 10/27/2009  . CONTUSION, ELBOW 10/27/2009    Teena Irani, PTA/CLT [phone]  09/24/2016, 1:50 PM  [facility] [address] [place], [place], [address] Phone: [phone]   Fax:  [phone]  Name: [name] MRN: [id number] Date of Birth: [birth year]

## 2016-09-26 ENCOUNTER — Ambulatory Visit (HOSPITAL_COMMUNITY): Payer: PPO | Admitting: Physical Therapy

## 2016-09-26 DIAGNOSIS — M6281 Muscle weakness (generalized): Secondary | ICD-10-CM

## 2016-09-26 DIAGNOSIS — R2689 Other abnormalities of gait and mobility: Secondary | ICD-10-CM

## 2016-09-26 DIAGNOSIS — M25662 Stiffness of left knee, not elsewhere classified: Secondary | ICD-10-CM

## 2016-09-26 DIAGNOSIS — M25562 Pain in left knee: Secondary | ICD-10-CM

## 2016-09-26 NOTE — Therapy (Signed)
[facility] [address] [place], [place], [address] Phone: [phone]   Fax:  [phone]  Physical Therapy Treatment  Patient Details  Name: [name] MRN: [id number] Date of Birth: Jan 24, 1946 Referring Provider: Aquilla Hacker, PA-C  Encounter Date: 09/26/2016      PT End of Session - 09/26/16 1543    Visit Number 6   Number of Visits 8   Date for PT Re-Evaluation 10/10/16   Authorization Type [employer]   Authorization Time Period 09/12/16 to 10/10/16   PT Start Time 1300   PT Stop Time 1345   PT Time Calculation (min) 45 min   Equipment Utilized During Treatment Gait belt   Activity Tolerance Patient tolerated treatment well;No increased pain   Behavior During Therapy WFL for tasks assessed/performed      Past Medical History:  Diagnosis Date  . Arthritis   . Complication of anesthesia    "pt. holds her breath when waking up"  . GERD (gastroesophageal reflux disease)   . Hypertension   . Hypothyroidism   . PONV (postoperative nausea and vomiting)   . Staph infection    abdomen and returned 5 yrs. later    Past Surgical History:  Procedure Laterality Date  . ABDOMINAL HYSTERECTOMY    . CERVICAL FUSION  1990's  . COLON SURGERY    . FOOT SURGERY Left    reshaped  . HERNIA REPAIR    . JOINT REPLACEMENT Right    hip  . LUMBAR SPINE SURGERY    . REPLACEMENT TOTAL KNEE Right   . THYROID SURGERY    . TOTAL KNEE ARTHROPLASTY Left 08/21/2016   Procedure: LEFT TOTAL KNEE ARTHROPLASTY;  Surgeon: Sheral Apley, MD;  Location: [facility];  Service: Orthopedics;  Laterality: Left;    There were no vitals filed for this visit.      Subjective Assessment - 09/26/16 1319    Subjective Pt states she is hurting all over, expecially in her Rt lower lumbar.  States she is scheduled to have nerve release surgury on her Rt elbow tomorrow morning.    Currently in Pain? Yes   Pain Score 7    Pain Location Back    Pain Orientation Lower;Mid;Right   Pain Descriptors / Indicators Aching;Sore   Pain Type Chronic pain                         [facility] Adult PT Treatment/Exercise - 09/26/16 0001      Knee/Hip Exercises: Standing   Gait Training around dept and use of mirror for biofeedback.     Knee/Hip Exercises: Seated   Sit to Sand 10 reps;without UE support     Knee/Hip Exercises: Supine   Short Arc Quad Sets Left;15 reps   Bridges with [employer] Both;2 sets;10 reps     Knee/Hip Exercises: Sidelying   Clams 15 X 3" Lt only 2 sets     Manual Therapy   Manual Therapy Soft tissue mobilization;Edema management   Manual therapy comments completed seperately from all other skilled interventions   Edema Management to Lt knee with elevation to decrease edema   Soft tissue mobilization to Rt hip/lumbar area to decrease spasm and discomfort                PT Education - 09/26/16 1542    Education provided Yes   Education Details educated on compression stockings and to discuss with MD if believes  would be beneficial   Person(s) Educated Patient   Methods Explanation;Demonstration   Comprehension Verbalized understanding          PT Short Term Goals - 09/19/16 1035      PT SHORT TERM GOAL #1   Title Pt will demo consistency and independence with her HEP to improve LE strength and proprioception.    Time 1   Period Weeks   Status On-going           PT Long Term Goals - 09/19/16 1036      PT LONG TERM GOAL #1   Title Pt will demo improved BLE strength to atleast 4/5 MMT which will improve stability and safety with functional activity.    Baseline hip abductors weak   Time 4   Period Weeks   Status Partially Met     PT LONG TERM GOAL #2   Title Pt will maintain single leg balance on each LE for atleast 15 sec without LOB, 2/3 trials, to improve safety with ambulation without an AD.   Baseline 1/3 trials    Time 4   Period Weeks   Status Partially Met      PT LONG TERM GOAL #3   Title Pt will complete 5x sit to stand in less than 10 sec, without UE support or weight shift, to demonstrate an improvement in her functional strength and power.   Baseline 8.7 sec, no UE support    Time 4   Period Weeks   Status Achieved               Plan - 09/26/16 1544    Clinical Impression Statement Pt with increased discomfort/symptoms today with pain into Rt glute/lumbar and Lt knee.  Began session with manual to help relax musculture prior tostrengthening.  Educated on benefits of compression garments to reduce swelling in Lt knee.  continuied with therex adding additional set of sidelying clams and focus on improving gait with less trendelenburg.     Rehab Potential Good   Clinical Impairments Affecting Rehab Potential (+) highly motivated   PT Frequency 2x / week   PT Duration 4 weeks   PT Treatment/Interventions ADLs/Self Care Home Management;Cryotherapy;Moist Heat;Gait training;Neuromuscular re-education;Balance training;Therapeutic exercise;Therapeutic activities;Functional mobility training;Stair training;Patient/family education;Manual techniques;Passive range of motion;Scar mobilization   PT Next Visit Plan continue hip abductor strengthening (isometrics as well); dynamic balance activity. Pt to have elbow nerve release surgery tomorrow.  Resume Next session adding theraband to side stepping.   PT Home Exercise Plan sit to stand with red TB around knees 3x5, tandem hold 3x20 sec; Seated IR with band ball at knees; Glute max bridge with ball squeeze 2x10    Consulted and Agree with Plan of Care Patient      Patient will benefit from skilled therapeutic intervention in order to improve the following deficits and impairments:  Abnormal gait, Decreased balance, Decreased activity tolerance, Difficulty walking, Decreased strength, Decreased range of motion, Decreased scar mobility, Pain, Improper body mechanics  Visit Diagnosis: Acute pain of  left knee  Stiffness of left knee, not elsewhere classified  Muscle weakness (generalized)  Other abnormalities of gait and mobility     Problem List Patient Active Problem List   Diagnosis Date Noted  . Primary localized osteoarthritis of left knee 08/22/2016  . Paresthesia 08/10/2016  . Weakness 08/10/2016  . Depression 07/31/2016  . S/P thyroidectomy 07/31/2016  . Primary osteoarthritis of left knee 07/31/2016  . S/P cervical spinal fusion 07/31/2016  .  S/P total knee arthroplasty, right 07/31/2016  . SUBLUXATION, RADIAL HEAD 10/27/2009  . CONTUSION, ELBOW 10/27/2009    Teena Irani, PTA/CLT [phone]  09/26/2016, 3:47 PM  [facility] [address] [place], [place], [address] Phone: [phone]   Fax:  [phone]  Name: RALYNN SAN MRN: [id number] Date of Birth: 16-Sep-1945

## 2016-09-27 DIAGNOSIS — G5621 Lesion of ulnar nerve, right upper limb: Secondary | ICD-10-CM | POA: Diagnosis not present

## 2016-10-01 ENCOUNTER — Ambulatory Visit (HOSPITAL_COMMUNITY): Payer: PPO | Admitting: Physical Therapy

## 2016-10-01 DIAGNOSIS — M25562 Pain in left knee: Secondary | ICD-10-CM

## 2016-10-01 DIAGNOSIS — R2689 Other abnormalities of gait and mobility: Secondary | ICD-10-CM

## 2016-10-01 DIAGNOSIS — M6281 Muscle weakness (generalized): Secondary | ICD-10-CM

## 2016-10-01 DIAGNOSIS — M25662 Stiffness of left knee, not elsewhere classified: Secondary | ICD-10-CM

## 2016-10-01 NOTE — Therapy (Signed)
[facility] [place], [place], [address] Phone: [phone]   Fax:  [phone]  Physical Therapy Treatment  Patient Details  Name: [name] MRN: [id number] Date of Birth: 06-19-1945 Referring Provider: Roxan Hockey, PA-C  Encounter Date: 10/01/2016      PT End of Session - 10/01/16 1515    Visit Number 7   Number of Visits 8   Date for PT Re-Evaluation 10/10/16   Authorization Type [employer]   Authorization Time Period 09/12/16 to 10/10/16   PT Start Time 1312   PT Stop Time 1355   PT Time Calculation (min) 43 min   Equipment Utilized During Treatment Gait belt   Activity Tolerance Patient tolerated treatment well;No increased pain   Behavior During Therapy WFL for tasks assessed/performed      Past Medical History:  Diagnosis Date  . Arthritis   . Complication of anesthesia    "pt. holds her breath when waking up"  . GERD (gastroesophageal reflux disease)   . Hypertension   . Hypothyroidism   . PONV (postoperative nausea and vomiting)   . Staph infection    abdomen and returned 5 yrs. later    Past Surgical History:  Procedure Laterality Date  . ABDOMINAL HYSTERECTOMY    . CERVICAL FUSION  1990's  . COLON SURGERY    . FOOT SURGERY Left    reshaped  . HERNIA REPAIR    . JOINT REPLACEMENT Right    hip  . LUMBAR SPINE SURGERY    . REPLACEMENT TOTAL KNEE Right   . THYROID SURGERY    . TOTAL KNEE ARTHROPLASTY Left 08/21/2016   Procedure: LEFT TOTAL KNEE ARTHROPLASTY;  Surgeon: Renette Butters, MD;  Location: [facility];  Service: Orthopedics;  Laterality: Left;    There were no vitals filed for this visit.      Subjective Assessment - 10/01/16 1513    Subjective Pt comes today, S/P Rt elbow release surgery on Thursday.  States her knee is not hurting at all today with slight discomfort in her Rt elbow only.   Currently in Pain? No/denies                         [facility]  Adult PT Treatment/Exercise - 10/01/16 0001      Knee/Hip Exercises: Standing   Heel Raises 15 reps   Heel Raises Limitations toerasies 15 reps     Knee/Hip Exercises: Seated   Sit to Sand 10 reps;without UE support     Knee/Hip Exercises: Supine   Quad Sets 15 reps   Short Arc Quad Sets Left;15 reps   Bridges with [employer] Both;2 sets;10 reps     Knee/Hip Exercises: Sidelying   Hip ABduction 10 reps   Clams 15 X 3" Lt only 2 sets     Manual Therapy   Manual Therapy Soft tissue mobilization;Edema management   Manual therapy comments completed seperately from all other skilled interventions   Edema Management to Lt knee with elevation to decrease edema   Soft tissue mobilization to Rt hip/lumbar area to decrease spasm and discomfort                  PT Short Term Goals - 09/19/16 1035      PT SHORT TERM GOAL #1   Title Pt will demo consistency and independence with her HEP to improve LE strength and proprioception.    Time 1  Period Weeks   Status On-going           PT Long Term Goals - 09/19/16 1036      PT LONG TERM GOAL #1   Title Pt will demo improved BLE strength to atleast 4/5 MMT which will improve stability and safety with functional activity.    Baseline hip abductors weak   Time 4   Period Weeks   Status Partially Met     PT LONG TERM GOAL #2   Title Pt will maintain single leg balance on each LE for atleast 15 sec without LOB, 2/3 trials, to improve safety with ambulation without an AD.   Baseline 1/3 trials    Time 4   Period Weeks   Status Partially Met     PT LONG TERM GOAL #3   Title Pt will complete 5x sit to stand in less than 10 sec, without UE support or weight shift, to demonstrate an improvement in her functional strength and power.   Baseline 8.7 sec, no UE support    Time 4   Period Weeks   Status Achieved               Plan - 10/01/16 1515    Clinical Impression Statement No pain today and minimal swelling  noted in Lt knee.  Continued with therex per POC.  No other mentions or concerns regarding use of compression garments, as patient chooses not to wear these.  Noted improvmeent in Lt hip abdeuctor strength with abiltiy to complete sidelying hip abduction with increased ROM achieved.  Pt reported overall improvement, unsure if it is because the rest she has been getting since her elbow surgery.    Rehab Potential Good   Clinical Impairments Affecting Rehab Potential (+) highly motivated   PT Frequency 2x / week   PT Duration 4 weeks   PT Treatment/Interventions ADLs/Self Care Home Management;Cryotherapy;Moist Heat;Gait training;Neuromuscular re-education;Balance training;Therapeutic exercise;Therapeutic activities;Functional mobility training;Stair training;Patient/family education;Manual techniques;Passive range of motion;Scar mobilization   PT Next Visit Plan Re-evaluate next session.    PT Home Exercise Plan sit to stand with red TB around knees 3x5, tandem hold 3x20 sec; Seated IR with band ball at knees; Glute max bridge with ball squeeze 2x10    Consulted and Agree with Plan of Care Patient      Patient will benefit from skilled therapeutic intervention in order to improve the following deficits and impairments:  Abnormal gait, Decreased balance, Decreased activity tolerance, Difficulty walking, Decreased strength, Decreased range of motion, Decreased scar mobility, Pain, Improper body mechanics  Visit Diagnosis: Acute pain of left knee  Stiffness of left knee, not elsewhere classified  Muscle weakness (generalized)  Other abnormalities of gait and mobility     Problem List Patient Active Problem List   Diagnosis Date Noted  . Primary localized osteoarthritis of left knee 08/22/2016  . Paresthesia 08/10/2016  . Weakness 08/10/2016  . Depression 07/31/2016  . S/P thyroidectomy 07/31/2016  . Primary osteoarthritis of left knee 07/31/2016  . S/P cervical spinal fusion 07/31/2016   . S/P total knee arthroplasty, right 07/31/2016  . SUBLUXATION, RADIAL HEAD 10/27/2009  . CONTUSION, ELBOW 10/27/2009    Teena Irani, PTA/CLT [phone]  10/01/2016, 3:19 PM  [facility] [address] [place], [place], [address] Phone: [phone]   Fax:  [phone]  Name: [name] MRN: [id number] Date of Birth: 02-17-1946

## 2016-10-03 ENCOUNTER — Encounter (HOSPITAL_COMMUNITY): Payer: PPO | Admitting: Physical Therapy

## 2016-10-10 DIAGNOSIS — G5621 Lesion of ulnar nerve, right upper limb: Secondary | ICD-10-CM | POA: Diagnosis not present

## 2016-10-11 ENCOUNTER — Ambulatory Visit (HOSPITAL_COMMUNITY): Payer: PPO | Attending: Orthopedic Surgery | Admitting: Physical Therapy

## 2016-10-11 DIAGNOSIS — M6281 Muscle weakness (generalized): Secondary | ICD-10-CM | POA: Diagnosis not present

## 2016-10-11 DIAGNOSIS — R278 Other lack of coordination: Secondary | ICD-10-CM | POA: Diagnosis not present

## 2016-10-11 DIAGNOSIS — M25521 Pain in right elbow: Secondary | ICD-10-CM | POA: Diagnosis not present

## 2016-10-11 DIAGNOSIS — M25662 Stiffness of left knee, not elsewhere classified: Secondary | ICD-10-CM

## 2016-10-11 DIAGNOSIS — M25562 Pain in left knee: Secondary | ICD-10-CM | POA: Insufficient documentation

## 2016-10-11 DIAGNOSIS — R2689 Other abnormalities of gait and mobility: Secondary | ICD-10-CM | POA: Diagnosis not present

## 2016-10-11 DIAGNOSIS — R29898 Other symptoms and signs involving the musculoskeletal system: Secondary | ICD-10-CM | POA: Insufficient documentation

## 2016-10-11 NOTE — Therapy (Signed)
[place], [place], [address] Phone: [phone]   Fax:  [phone]  Physical Therapy Treatment/Re-evaluation  Patient Details  Name: [name] MRN: [id number] Date of Birth: 1946-05-31 Referring Provider: Roxan Hockey, PA-C  Encounter Date: 10/11/2016      PT End of Session - 10/11/16 1147    Visit Number 8   Number of Visits 12   Date for PT Re-Evaluation 10/10/16   Authorization Type [employer]   Authorization Time Period 09/12/16 to 10/10/16 NEW: 10/11/16 to 11/08/16   PT Start Time 0953   PT Stop Time 1035   PT Time Calculation (min) 42 min   Activity Tolerance Patient tolerated treatment well;No increased pain   Behavior During Therapy WFL for tasks assessed/performed      Past Medical History:  Diagnosis Date  . Arthritis   . Complication of anesthesia    "pt. holds her breath when waking up"  . GERD (gastroesophageal reflux disease)   . Hypertension   . Hypothyroidism   . PONV (postoperative nausea and vomiting)   . Staph infection    abdomen and returned 5 yrs. later    Past Surgical History:  Procedure Laterality Date  . ABDOMINAL HYSTERECTOMY    . CERVICAL FUSION  1990's  . COLON SURGERY    . FOOT SURGERY Left    reshaped  . HERNIA REPAIR    . JOINT REPLACEMENT Right    hip  . LUMBAR SPINE SURGERY    . REPLACEMENT TOTAL KNEE Right   . THYROID SURGERY    . TOTAL KNEE ARTHROPLASTY Left 08/21/2016   Procedure: LEFT TOTAL KNEE ARTHROPLASTY;  Surgeon: Renette Butters, MD;  Location: [place];  Service: Orthopedics;  Laterality: Left;    There were no vitals filed for this visit.      Subjective Assessment - 10/11/16 0955    Subjective Pt reports that she saw the surgeon this past week. She says her knee is not bothering her and the surgeon was pleased. She says that her stamina is not where she would like it, but she has not been walking regularly.    How long can you  sit comfortably? unlimited    How long can you stand comfortably? 1 hour    How long can you walk comfortably? 1 hour    Patient Stated Goals improve stamina    Currently in Pain? No/denies            [facility] PT Assessment - 10/11/16 0001      Assessment   Medical Diagnosis s/p Lt TKA   Referring Provider Roxan Hockey, PA-C   Onset Date/Surgical Date 08/21/16   Next MD Visit 10/27/16   Prior Therapy 9 visits of HHPT     Precautions   Precautions None     [place] residence   Living Arrangements Parent   Additional Comments ramped entrance      Prior Function   Level of Independence Independent   Leisure Currently living with her mother to provide company      Observation/Other Assessments   Focus on Therapeutic Outcomes (FOTO)  27% limited      Sensation   Light Touch Appears Intact   Additional Comments  mild numbness around surgical incision     AROM   Right Knee Extension 0   Right Knee Flexion 110   Left Knee Extension 0   Left Knee Flexion 130  Strength   Right Hip Flexion 4/5   Right Hip Extension 4/5   Right Hip ABduction 4+/5   Left Hip Flexion 4/5   Left Hip Extension 4/5   Left Hip ABduction 3+/5   Right Knee Flexion 5/5   Right Knee Extension 5/5   Left Knee Flexion 5/5   Left Knee Extension 5/5   Right Ankle Dorsiflexion 5/5   Left Ankle Dorsiflexion 5/5     Palpation   Palpation comment --     Transfers   Five time sit to stand comments  8.7 sec, no UE support     Standardized Balance Assessment   Standardized Balance Assessment Timed Up and Go Test     Timed Up and Go Test   TUG Comments 7.9 sec, no AD     High Level Balance   High Level Balance Comments SLS: ~5 sec each                      [facility] Adult PT Treatment/Exercise - 10/11/16 0001      Ambulation/Gait   Gait Comments (+) trendelenburg on the Rt, Lt trunk lean during mid stance x171f     Knee/Hip Exercises:  Standing   Other Standing Knee Exercises side stepping with green TB around knees Lt/Rt x15 ft  HEP demo    Other Standing Knee Exercises Rt hip hike/drop from step, x10 reps                PT Education - 10/11/16 1146    Education provided Yes   Education Details Discussed reason for PT re-evaluation to determine need for more/less visits; pointed out improvements in strength, balance and mobility as well as remaining areas of limitation; updated HEP    Person(s) Educated Patient   Methods Explanation;Demonstration;Handout;Verbal cues   Comprehension Verbalized understanding;Returned demonstration          PT Short Term Goals - 10/11/16 1011      PT SHORT TERM GOAL #1   Title Pt will demo consistency and independence with her HEP to improve LE strength and proprioception.    Time 1   Period Weeks   Status Achieved           PT Long Term Goals - 10/11/16 1011      PT LONG TERM GOAL #1   Title Pt will demo improved BLE strength to atleast 4/5 MMT which will improve stability and safety with functional activity.    Baseline Lt hip abductor weakness    Time 4   Period Weeks   Status Partially Met     PT LONG TERM GOAL #2   Title Pt will maintain single leg balance on each LE for atleast 15 sec without LOB, 2/3 trials, to improve safety with ambulation without an AD.   Baseline 1/3 trials    Time 4   Period Weeks   Status Partially Met     PT LONG TERM GOAL #3   Title Pt will complete 5x sit to stand in less than 10 sec, without UE support or weight shift, to demonstrate an improvement in her functional strength and power.   Baseline 8.7 sec, no UE support    Time 4   Period Weeks   Status Achieved     PT LONG TERM GOAL #4   Title Pt will demo improved endurance and activity tolerance, evident by her report of walking a minimum of 15 minutes atleast 3x/week.  Time 4   Period Weeks   Status New               Plan - 18-Oct-2016 1149    Clinical  Impression Statement Pt was re-evaluated this session with noted improvements in knee ROM, strength and mobility since beginning PT. Her Lt knee ROM is WFL and pain free. She also demonstrates improved BLE strength with MMT. She continues to demonstrate limitations in hip abductor strength, Lt>Rt, evident during ambulation with significant hip drop and trunk lean on the Lt. She also demonstrates decreased single leg balance which is placing her at an increased risk of falling with daily activity out in the community. Pt's HEP was updated this session to address remaining deficits in strength and proprioception, and the pt was able to perform all exercise additions without difficulty. She would benefit from several more visits of skilled PT to address her limitations mentioned above and improver her safety with daily activity and prevent need for other medical intervention in the future.    Rehab Potential Good   Clinical Impairments Affecting Rehab Potential (+) highly motivated   PT Frequency 2x / week   PT Duration 4 weeks   PT Treatment/Interventions ADLs/Self Care Home Management;Cryotherapy;Moist Heat;Gait training;Neuromuscular re-education;Balance training;Therapeutic exercise;Therapeutic activities;Functional mobility training;Stair training;Patient/family education;Manual techniques;Passive range of motion;Scar mobilization   PT Next Visit Plan Manual to address Lt hip flexor tightness, Lt QL muscle spasm/tightness; hip abductor strength (avoid sidelying unless against a wall)   PT Home Exercise Plan side stepping with green TB around knees, hip hike from step, walking atleast 5 min a day.    Consulted and Agree with Plan of Care Patient      Patient will benefit from skilled therapeutic intervention in order to improve the following deficits and impairments:  Abnormal gait, Decreased balance, Decreased activity tolerance, Difficulty walking, Decreased strength, Decreased range of motion,  Decreased scar mobility, Pain, Improper body mechanics  Visit Diagnosis: Acute pain of left knee  Stiffness of left knee, not elsewhere classified  Muscle weakness (generalized)  Other abnormalities of gait and mobility       G-Codes - 10/18/2016 1145    Functional Assessment Tool Used (Outpatient Only) clnical judgement based on assessment of ROM, strength and balance    Functional Limitation Mobility: Walking and moving around   Mobility: Walking and Moving Around Current Status [id number]) At least 20 percent but less than 40 percent impaired, limited or restricted   Mobility: Walking and Moving Around Goal Status [phone]) At least 1 percent but less than 20 percent impaired, limited or restricted      Problem List Patient Active Problem List   Diagnosis Date Noted  . Primary localized osteoarthritis of left knee 08/22/2016  . Paresthesia 08/10/2016  . Weakness 08/10/2016  . Depression 07/31/2016  . S/P thyroidectomy 07/31/2016  . Primary osteoarthritis of left knee 07/31/2016  . S/P cervical spinal fusion 07/31/2016  . S/P total knee arthroplasty, right 07/31/2016  . SUBLUXATION, RADIAL HEAD 10/27/2009  . CONTUSION, ELBOW 10/27/2009    12:05 PM,10/18/2016 Elly Modena PT, DPT [facility] [place] [address] [place], [place], [address] Phone: [phone]   Fax:  [phone]  Name: CAILYN HOUDEK MRN: [id number] Date of Birth: 09-04-1945

## 2016-10-17 DIAGNOSIS — Z1211 Encounter for screening for malignant neoplasm of colon: Secondary | ICD-10-CM | POA: Diagnosis not present

## 2016-10-17 DIAGNOSIS — Z1212 Encounter for screening for malignant neoplasm of rectum: Secondary | ICD-10-CM | POA: Diagnosis not present

## 2016-10-18 ENCOUNTER — Ambulatory Visit (HOSPITAL_COMMUNITY): Payer: PPO | Admitting: Physical Therapy

## 2016-10-18 DIAGNOSIS — M25562 Pain in left knee: Secondary | ICD-10-CM

## 2016-10-18 DIAGNOSIS — M6281 Muscle weakness (generalized): Secondary | ICD-10-CM

## 2016-10-18 DIAGNOSIS — R2689 Other abnormalities of gait and mobility: Secondary | ICD-10-CM

## 2016-10-18 DIAGNOSIS — M25662 Stiffness of left knee, not elsewhere classified: Secondary | ICD-10-CM

## 2016-10-18 NOTE — Therapy (Signed)
[place], [place], [address] Phone: [phone]   Fax:  [phone]  Physical Therapy Treatment  Patient Details  Name: [name] MRN: [id number] Date of Birth: July 18, 1945 Referring Provider: Roxan Hockey, PA-C  Encounter Date: 10/18/2016      PT End of Session - 10/18/16 1138    Visit Number 9   Number of Visits 12   Date for PT Re-Evaluation 10/10/16   Authorization Type [employer]   Authorization Time Period 09/12/16 to 10/10/16 NEW: 10/11/16 to 11/08/16   PT Start Time 1117   PT Stop Time 1158   PT Time Calculation (min) 41 min   Activity Tolerance Patient tolerated treatment well;No increased pain   Behavior During Therapy WFL for tasks assessed/performed      Past Medical History:  Diagnosis Date  . Arthritis   . Complication of anesthesia    "pt. holds her breath when waking up"  . GERD (gastroesophageal reflux disease)   . Hypertension   . Hypothyroidism   . PONV (postoperative nausea and vomiting)   . Staph infection    abdomen and returned 5 yrs. later    Past Surgical History:  Procedure Laterality Date  . ABDOMINAL HYSTERECTOMY    . CERVICAL FUSION  1990's  . COLON SURGERY    . FOOT SURGERY Left    reshaped  . HERNIA REPAIR    . JOINT REPLACEMENT Right    hip  . LUMBAR SPINE SURGERY    . REPLACEMENT TOTAL KNEE Right   . THYROID SURGERY    . TOTAL KNEE ARTHROPLASTY Left 08/21/2016   Procedure: LEFT TOTAL KNEE ARTHROPLASTY;  Surgeon: Renette Butters, MD;  Location: [place];  Service: Orthopedics;  Laterality: Left;    There were no vitals filed for this visit.      Subjective Assessment - 10/18/16 1119    Subjective Pt reports that she was able to do her exercises once on Friday, but she was not able to them after her elbow started hurting her.    How long can you sit comfortably? unlimited    How long can you stand comfortably? 1 hour    How long can you walk  comfortably? 1 hour    Patient Stated Goals improve stamina    Currently in Pain? No/denies                         [facility] Adult PT Treatment/Exercise - 10/18/16 0001      Ambulation/Gait   Gait Comments 5x8f, followed by 50 ft with therapist providing tactile cues to increase glute med activation during ambulation.      Knee/Hip Exercises: Standing   Other Standing Knee Exercises sidestepping x20 meters each direction. 1st set with red TB, 2nd set without TB in agility ladder    Other Standing Knee Exercises B hip hike from 6" step, 2x15 on Lt, x15 reps on Rt      Manual Therapy   Manual therapy comments separate rest of interventions    Soft tissue mobilization STM along Lt glute med; Lt QL and iliocostalis                 PT Education - 10/18/16 1138    Education provided Yes   Education Details imlpications for manual techniques; importance of daily HEP adherence to address limitations in hip strength; importance of glute activation during ambulation    Person(s) Educated  Patient   Methods Explanation;Tactile cues;Demonstration;Handout   Comprehension Verbalized understanding;Returned demonstration          PT Short Term Goals - 10/11/16 1011      PT SHORT TERM GOAL #1   Title Pt will demo consistency and independence with her HEP to improve LE strength and proprioception.    Time 1   Period Weeks   Status Achieved           PT Long Term Goals - 10/11/16 1011      PT LONG TERM GOAL #1   Title Pt will demo improved BLE strength to atleast 4/5 MMT which will improve stability and safety with functional activity.    Baseline Lt hip abductor weakness    Time 4   Period Weeks   Status Partially Met     PT LONG TERM GOAL #2   Title Pt will maintain single leg balance on each LE for atleast 15 sec without LOB, 2/3 trials, to improve safety with ambulation without an AD.   Baseline 1/3 trials    Time 4   Period Weeks   Status Partially  Met     PT LONG TERM GOAL #3   Title Pt will complete 5x sit to stand in less than 10 sec, without UE support or weight shift, to demonstrate an improvement in her functional strength and power.   Baseline 8.7 sec, no UE support    Time 4   Period Weeks   Status Achieved     PT LONG TERM GOAL #4   Title Pt will demo improved endurance and activity tolerance, evident by her report of walking a minimum of 15 minutes atleast 3x/week.    Time 4   Period Weeks   Status New               Plan - 10/18/16 1204    Clinical Impression Statement Pt arrived reporting minimal HEP adherence since her last session. Therapist discussed importance of full adherence in order to progress towards goals and pt verbalized better understanding. Spent some time addressing neuromuscular control of the hip abductors, specifically during initial contact during gait and pt was able to ambulate with significantly less trunk lean compared to earlier in her session. Encouraged pt to continue working on this at home, in addition to her other exercises, and she verbalized understanding.    Rehab Potential Good   Clinical Impairments Affecting Rehab Potential (+) highly motivated   PT Frequency 2x / week   PT Duration 4 weeks   PT Treatment/Interventions ADLs/Self Care Home Management;Cryotherapy;Moist Heat;Gait training;Neuromuscular re-education;Balance training;Therapeutic exercise;Therapeutic activities;Functional mobility training;Stair training;Patient/family education;Manual techniques;Passive range of motion;Scar mobilization   PT Next Visit Plan conitnued with manual to address Lt hip flexor tightness, Lt QL muscle spasm/tightness; hip abductor strength (avoid sidelying unless against a wall)   PT Home Exercise Plan side stepping with green TB around knees, hip hike from step, walking 5x10 ft with glute activation    Consulted and Agree with Plan of Care Patient      Patient will benefit from skilled  therapeutic intervention in order to improve the following deficits and impairments:  Abnormal gait, Decreased balance, Decreased activity tolerance, Difficulty walking, Decreased strength, Decreased range of motion, Decreased scar mobility, Pain, Improper body mechanics  Visit Diagnosis: Acute pain of left knee  Stiffness of left knee, not elsewhere classified  Muscle weakness (generalized)  Other abnormalities of gait and mobility     Problem List Patient  Active Problem List   Diagnosis Date Noted  . Primary localized osteoarthritis of left knee 08/22/2016  . Paresthesia 08/10/2016  . Weakness 08/10/2016  . Depression 07/31/2016  . S/P thyroidectomy 07/31/2016  . Primary osteoarthritis of left knee 07/31/2016  . S/P cervical spinal fusion 07/31/2016  . S/P total knee arthroplasty, right 07/31/2016  . SUBLUXATION, RADIAL HEAD 10/27/2009  . CONTUSION, ELBOW 10/27/2009    12:13 PM,10/18/16 Elly Modena PT, DPT [facility] [place] [address] [place], [place], [address] Phone: [phone]   Fax:  [phone]  Name: [name] MRN: [id number] Date of Birth: [birth year]

## 2016-10-25 ENCOUNTER — Encounter (HOSPITAL_COMMUNITY): Payer: PPO | Admitting: Physical Therapy

## 2016-11-01 ENCOUNTER — Encounter (HOSPITAL_COMMUNITY): Payer: Self-pay | Admitting: Occupational Therapy

## 2016-11-01 ENCOUNTER — Ambulatory Visit (HOSPITAL_COMMUNITY): Payer: PPO | Admitting: Occupational Therapy

## 2016-11-01 ENCOUNTER — Ambulatory Visit (HOSPITAL_COMMUNITY): Payer: PPO | Admitting: Physical Therapy

## 2016-11-01 DIAGNOSIS — R278 Other lack of coordination: Secondary | ICD-10-CM

## 2016-11-01 DIAGNOSIS — M25562 Pain in left knee: Secondary | ICD-10-CM | POA: Diagnosis not present

## 2016-11-01 DIAGNOSIS — M25521 Pain in right elbow: Secondary | ICD-10-CM

## 2016-11-01 DIAGNOSIS — R29898 Other symptoms and signs involving the musculoskeletal system: Secondary | ICD-10-CM

## 2016-11-01 DIAGNOSIS — M25662 Stiffness of left knee, not elsewhere classified: Secondary | ICD-10-CM

## 2016-11-01 DIAGNOSIS — R2689 Other abnormalities of gait and mobility: Secondary | ICD-10-CM

## 2016-11-01 DIAGNOSIS — M6281 Muscle weakness (generalized): Secondary | ICD-10-CM

## 2016-11-01 NOTE — Therapy (Signed)
[place], [place], [address] Phone: [phone]   Fax:  [phone]  Physical Therapy Treatment  Patient Details  Name: [name] MRN: [id number] Date of Birth: 06-23-1945 Referring Provider: Roxan Hockey, PA-C  Encounter Date: 11/01/2016      PT End of Session - 11/01/16 0956    Visit Number 10   Number of Visits 12   Date for PT Re-Evaluation 10/10/16   Authorization Type [employer]   Authorization Time Period 09/12/16 to 10/10/16 NEW: 10/11/16 to 11/08/16   PT Start Time 0950   PT Stop Time 1032   PT Time Calculation (min) 42 min   Equipment Utilized During Treatment Gait belt   Activity Tolerance Patient tolerated treatment well;No increased pain   Behavior During Therapy WFL for tasks assessed/performed      Past Medical History:  Diagnosis Date  . Arthritis   . Complication of anesthesia    "pt. holds her breath when waking up"  . GERD (gastroesophageal reflux disease)   . Hypertension   . Hypothyroidism   . PONV (postoperative nausea and vomiting)   . Staph infection    abdomen and returned 5 yrs. later    Past Surgical History:  Procedure Laterality Date  . ABDOMINAL HYSTERECTOMY    . CERVICAL FUSION  1990's  . COLON SURGERY    . FOOT SURGERY Left    reshaped  . HERNIA REPAIR    . JOINT REPLACEMENT Right    hip  . LUMBAR SPINE SURGERY    . REPLACEMENT TOTAL KNEE Right   . THYROID SURGERY    . TOTAL KNEE ARTHROPLASTY Left 08/21/2016   Procedure: LEFT TOTAL KNEE ARTHROPLASTY;  Surgeon: Renette Butters, MD;  Location: [facility];  Service: Orthopedics;  Laterality: Left;    There were no vitals filed for this visit.      Subjective Assessment - 11/01/16 0952    Subjective Pt reportst that things are going well. She was able to work on her exercises some over the past couple of weeks. She feels that they are not much easier than they were when it started.    How long can  you sit comfortably? unlimited    How long can you stand comfortably? 1 hour    How long can you walk comfortably? 1 hour    Patient Stated Goals improve stamina    Currently in Pain? No/denies                         [facility] Adult PT Treatment/Exercise - 11/01/16 0001      Knee/Hip Exercises: Stretches   Other Knee/Hip Stretches hip flexor stretch in supine, 2x30 sec each    Other Knee/Hip Stretches BKTC x2 reps for HEP demo; low trunk rotation stretch x5 reps each for HEP demo      Knee/Hip Exercises: Standing   Knee Flexion Both;2 sets;10 reps   Knee Flexion Limitations standing at counter    Forward Step Up 15 reps;Hand Hold: 0;Hand Hold: 1;Step Height: 6"   Forward Step Up Limitations contralateral knee drive    Other Standing Knee Exercises sidestepping in agility ladder x3 RT    Other Standing Knee Exercises B hip hike from 4" step, 2x15 on Lt, x15 reps on Rt                PT Education - 11/01/16 1041    Education provided Yes  Education Details upcoming re-evaluation; additions to HEP for encouraged movement/stretches following heavy activity   Person(s) Educated Patient   Methods Explanation;Verbal cues;Handout   Comprehension Returned demonstration;Verbalized understanding          PT Short Term Goals - 10/11/16 1011      PT SHORT TERM GOAL #1   Title Pt will demo consistency and independence with her HEP to improve LE strength and proprioception.    Time 1   Period Weeks   Status Achieved           PT Long Term Goals - 10/11/16 1011      PT LONG TERM GOAL #1   Title Pt will demo improved BLE strength to atleast 4/5 MMT which will improve stability and safety with functional activity.    Baseline Lt hip abductor weakness    Time 4   Period Weeks   Status Partially Met     PT LONG TERM GOAL #2   Title Pt will maintain single leg balance on each LE for atleast 15 sec without LOB, 2/3 trials, to improve safety with ambulation  without an AD.   Baseline 1/3 trials    Time 4   Period Weeks   Status Partially Met     PT LONG TERM GOAL #3   Title Pt will complete 5x sit to stand in less than 10 sec, without UE support or weight shift, to demonstrate an improvement in her functional strength and power.   Baseline 8.7 sec, no UE support    Time 4   Period Weeks   Status Achieved     PT LONG TERM GOAL #4   Title Pt will demo improved endurance and activity tolerance, evident by her report of walking a minimum of 15 minutes atleast 3x/week.    Time 4   Period Weeks   Status New               Plan - 11/01/16 1044    Clinical Impression Statement (P)  Pt continues to adhere with her HEP and reports improvements in her mobility and activity tolerance over her vacation. Session focused on therex to address hip abductor strength with noted improvements in the pt's technique and ability to tolerate increased reps and sets. Ended session with HEP review and several additions to address muscle tightness following heavy activity. Pt able to demonstrate proper technique and understanding of these additions. Will continue with current POC.    Rehab Potential (P)  Good   Clinical Impairments Affecting Rehab Potential (P)  (+) highly motivated   PT Frequency (P)  2x / week   PT Duration (P)  4 weeks   PT Treatment/Interventions (P)  ADLs/Self Care Home Management;Cryotherapy;Moist Heat;Gait training;Neuromuscular re-education;Balance training;Therapeutic exercise;Therapeutic activities;Functional mobility training;Stair training;Patient/family education;Manual techniques;Passive range of motion;Scar mobilization   PT Next Visit Plan (P)  conitnued with manual to address Lt hip flexor tightness, Lt QL muscle spasm/tightness; hip abductor strength (avoid sidelying unless against a wall)   PT Home Exercise Plan (P)  side stepping with green TB around knees, hip hike from step, walking 5x10 ft with glute activation     Consulted and Agree with Plan of Care (P)  Patient      Patient will benefit from skilled therapeutic intervention in order to improve the following deficits and impairments:  (P) Abnormal gait, Decreased balance, Decreased activity tolerance, Difficulty walking, Decreased strength, Decreased range of motion, Decreased scar mobility, Pain, Improper body mechanics  Visit Diagnosis:  Acute pain of left knee  Stiffness of left knee, not elsewhere classified  Muscle weakness (generalized)  Other abnormalities of gait and mobility     Problem List Patient Active Problem List   Diagnosis Date Noted  . Primary localized osteoarthritis of left knee 08/22/2016  . Paresthesia 08/10/2016  . Weakness 08/10/2016  . Depression 07/31/2016  . S/P thyroidectomy 07/31/2016  . Primary osteoarthritis of left knee 07/31/2016  . S/P cervical spinal fusion 07/31/2016  . S/P total knee arthroplasty, right 07/31/2016  . SUBLUXATION, RADIAL HEAD 10/27/2009  . CONTUSION, ELBOW 10/27/2009   10:52 AM,11/01/16 [name] PT, DPT [facility] [place] [address] [place], [place], [address] Phone: [phone]   Fax:  [phone]  Name: [name] MRN: [id number] Date of Birth: September 01, 1945

## 2016-11-01 NOTE — Patient Instructions (Signed)
Home Exercises Program Theraputty Exercises  Do the following exercises 2 times a day using your affected hand.  1. Roll putty into a ball.  2. Make into a pancake.  3. Roll putty into a roll.  4. Pinch along log with first finger and thumb.   5. Make into a ball.  6. Roll it back into a log.   7. Pinch using thumb and side of first finger.  8. Roll into a ball, then flatten into a pancake.  9. Using your fingers, make putty into a mountain.    1) Towel crunch Place a small towel on a firm table top. Flatten out the towel and then place your hand on one end of it.  Next, flex your fingers 2-5 (index finger through pinky finger) as you pull the towel towards your hand.     2) Thumb/finger opposition Touch the tip of the thumb to each fingertip one by one. Extend fingers fully after they are touched.      3) Finger Taps Start with the hand flat and fingers slightly spread.  One at a time, starting with the thumb, lift each finger up separately.        4) Digit Abduction/Adduction Hold hand palm down flat on table. Spread your fingers apart and back together.

## 2016-11-02 NOTE — Therapy (Signed)
[place], [place], [address] Phone: [phone]   Fax:  [phone]  Occupational Therapy Evaluation  Patient Details  Name: [name] MRN: [id number] Date of Birth: 1945/11/16 Referring Provider: Dr. Edmonia Lynch  Encounter Date: 11/01/2016      OT End of Session - 11/01/16 1624    Visit Number 1   Number of Visits 8   Date for OT Re-Evaluation 12/01/16   Authorization Type [employer]   Authorization Time Period Before 10th visit   Authorization - Visit Number 1   Authorization - Number of Visits 10   OT Start Time 1118   OT Stop Time 1200   OT Time Calculation (min) 42 min   Activity Tolerance Patient tolerated treatment well   Behavior During Therapy [facility] for tasks assessed/performed      Past Medical History:  Diagnosis Date  . Arthritis   . Complication of anesthesia    "pt. holds her breath when waking up"  . GERD (gastroesophageal reflux disease)   . Hypertension   . Hypothyroidism   . PONV (postoperative nausea and vomiting)   . Staph infection    abdomen and returned 5 yrs. later    Past Surgical History:  Procedure Laterality Date  . ABDOMINAL HYSTERECTOMY    . CERVICAL FUSION  1990's  . COLON SURGERY    . FOOT SURGERY Left    reshaped  . HERNIA REPAIR    . JOINT REPLACEMENT Right    hip  . LUMBAR SPINE SURGERY    . REPLACEMENT TOTAL KNEE Right   . THYROID SURGERY    . TOTAL KNEE ARTHROPLASTY Left 08/21/2016   Procedure: LEFT TOTAL KNEE ARTHROPLASTY;  Surgeon: Renette Butters, MD;  Location: [place];  Service: Orthopedics;  Laterality: Left;    There were no vitals filed for this visit.      Subjective Assessment - 11/01/16 1333    Subjective  S: The numbness is getting better, it's tingling nwo.    Pertinent History Pt is a 71 y/o female s/p surgery for right cubital tunnel syndrome approximately 4 weeks ago (pt is unsure of exact date). Pt reports she is  now feeling tingling versus numbness along the ulnar nerve in her hand. Pt was referred to occupational therapy for evaluation and treatment by Dr. Edmonia Lynch.    Special Tests FOTO Score: 41/100 (59% impaired)   Patient Stated Goals To be able to use my right arm as my dominant arm.    Currently in Pain? No/denies           [facility] OT Assessment - 11/01/16 1117      Assessment   Diagnosis s/p Ulnar nerve impingment sx   Referring Provider Dr. Edmonia Lynch   Onset Date 10/04/16     Precautions   Precautions None     Restrictions   Weight Bearing Restrictions No     Balance Screen   Has the patient fallen in the past 6 months No   Has the patient had a decrease in activity level because of a fear of falling?  No   Is the patient reluctant to leave their home because of a fear of falling?  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 assist with I/ADL tasks     ADL   ADL comments Pt is having  difficulty with completing sustained tasks such as cooking, occasionally experiences catches with forward reaching, has difficulty with sensation-heat versus cold, cutting food items, picking up weighted objects     Written Expression   Dominant Hand Right   Handwriting 75% legible     Cognition   Overall Cognitive Status Within Functional Limits for tasks assessed     Sensation   Semmes Weinstein Monofilament Scale Diminshed Protective  along ulnar nerve at hand   Hot/Cold Impaired by gross assessment  pt reports she is unable to heat heating element at times     Coordination   9 Hole Peg Test Right;Left   Left 9 Hole Peg Test 26.04"   Box and Blocks 24.30"     ROM / Strength   AROM / PROM / Strength AROM;Strength     Palpation   Palpation comment minimal restrictions along volar elbow and forearm regions     AROM   AROM Assessment Site Elbow   Right/Left Elbow Right   Right  Elbow Flexion 145   Right Elbow Extension 0     Strength   Strength Assessment Site Elbow;Forearm;Wrist;Hand   Right/Left Elbow Right   Right Elbow Flexion 4/5   Right Elbow Extension 4-/5   Right/Left Forearm Right   Right Forearm Pronation 4-/5   Right Forearm Supination 3+/5   Right/Left Wrist Right   Right Wrist Flexion 4/5   Right Wrist Extension 4+/5   Right Wrist Radial Deviation 4/5   Right Wrist Ulnar Deviation 4/5   Right/Left hand Left;Right   Right Hand Gross Grasp Functional   Right Hand Grip (lbs) 45   Right Hand Lateral Pinch 8 lbs   Right Hand 3 Point Pinch 7 lbs   Left Hand Gross Grasp Functional   Left Hand Grip (lbs) 60   Left Hand Lateral Pinch 17 lbs   Left Hand 3 Point Pinch 12 lbs                         OT Education - 11/01/16 1331    Education provided Yes   Education Details fine motor coordination and red theraputty exercises   Person(s) Educated Patient   Methods Explanation;Demonstration;Handout   Comprehension Verbalized understanding;Returned demonstration          OT Short Term Goals - 11/02/16 0756      OT SHORT TERM GOAL #1   Title Pt will be educated on HEP to improve ability to use RUE as dominant during B/IADL tasks.    Time 4   Period Weeks   Status New     OT SHORT TERM GOAL #2   Title Pt will decrease fascial restrictions from mod to minimal amounts or less to improve mobility required for functional reaching tasks.    Time 4   Period Weeks   Status New     OT SHORT TERM GOAL #3   Title Pt will improve pain to 2/10 in RUE to increase ability to use RUE as dominant during daily task completion.    Time 4   Period Weeks   Status New     OT SHORT TERM GOAL #4   Title Pt will improve RUE strength to 4+/5 to improve ability to complete gardening tasks.    Time 4   Period Weeks   Status New     OT SHORT TERM GOAL #5   Title Pt will improve right grip strength by 15# and pinch strength by  4# to improve  ability to hold pots/pans/plates during meal preparation.    Time 4   Period Weeks   Status New     Additional Short Term Goals   Additional Short Term Goals Yes     OT SHORT TERM GOAL #6   Title Pt will improve RUE fine motor coordination by completing 9 hole peg test in less than 22" to improve ability to button shirts.    Time 4   Period Weeks   Status New                  Plan - 11/01/16 1624    Clinical Impression Statement A: Pt is a 71 y/o female s/p surgery for right cubital tunnel sydrome approximately 4 weeks ago, with deficits in strength and increased fascial restrictions limiting ability to complete ADL tasks as dominant. Pt provided with red theraputty and fine motor coordination exercises for HEP.    Occupational Profile and client history currently impacting functional performance Pt is independent at baseline and is motivated to return to highest level of functioning   Occupational performance deficits (Please refer to evaluation for details): ADL's;IADL's;Leisure   Rehab Potential Good   OT Frequency 2x / week   OT Duration 4 weeks   OT Treatment/Interventions Self-care/ADL training;Therapeutic exercise;Patient/family education;Ultrasound;Manual Therapy;Therapeutic activities;Cryotherapy;Electrical Stimulation;Moist Heat;Passive range of motion   Plan P: Pt will benefit from skilled OT services to decrease fascial restrictions, improve RUE strength, grip and pinch strength, and fine motor coordination improving RUE use as dominant during B/IADL completion. Treatment plan: myofascial release, elbow, forearm, wrist strengthening, grip and pinch strengthening, fine motor coordination exercises, splinting, modalities   Clinical Decision Making Limited treatment options, no task modification necessary   OT Home Exercise Plan 5/31: red theraputty exercises, fine motor coordination    Consulted and Agree with Plan of Care Patient      Patient will benefit from  skilled therapeutic intervention in order to improve the following deficits and impairments:  Increased muscle spasms, Decreased activity tolerance, Decreased strength, Impaired sensation, Decreased range of motion, Pain, Increased edema, Impaired UE functional use, Increased fascial restricitons, Decreased coordination  Visit Diagnosis: Pain in right elbow  Other lack of coordination  Other symptoms and signs involving the musculoskeletal system      G-Codes - 11-18-16 0800    Functional Assessment Tool Used (Outpatient only) FOTO Socre: 41/100 (59% impairment)   Functional Limitation Carrying, moving and handling objects   Carrying, Moving and Handling Objects Current Status (N0037) At least 40 percent but less than 60 percent impaired, limited or restricted   Carrying, Moving and Handling Objects Goal Status (C4888) At least 1 percent but less than 20 percent impaired, limited or restricted      Problem List Patient Active Problem List   Diagnosis Date Noted  . Primary localized osteoarthritis of left knee 08/22/2016  . Paresthesia 08/10/2016  . Weakness 08/10/2016  . Depression 07/31/2016  . S/P thyroidectomy 07/31/2016  . Primary osteoarthritis of left knee 07/31/2016  . S/P cervical spinal fusion 07/31/2016  . S/P total knee arthroplasty, right 07/31/2016  . SUBLUXATION, RADIAL HEAD 10/27/2009  . CONTUSION, ELBOW 10/27/2009   Guadelupe Sabin, OTR/L  [phone] 11/18/2016, 10:09 AM  [facility] [address] [place], [place], [address] Phone: [phone]   Fax:  [phone]  Name: [name] MRN: [id number] Date of Birth: Sep 05, 1945

## 2016-11-07 ENCOUNTER — Encounter (HOSPITAL_COMMUNITY): Payer: PPO

## 2016-11-07 DIAGNOSIS — G5621 Lesion of ulnar nerve, right upper limb: Secondary | ICD-10-CM | POA: Diagnosis not present

## 2016-11-08 ENCOUNTER — Ambulatory Visit (HOSPITAL_COMMUNITY): Payer: PPO | Attending: Orthopedic Surgery | Admitting: Physical Therapy

## 2016-11-08 DIAGNOSIS — M25521 Pain in right elbow: Secondary | ICD-10-CM | POA: Insufficient documentation

## 2016-11-08 DIAGNOSIS — M6281 Muscle weakness (generalized): Secondary | ICD-10-CM

## 2016-11-08 DIAGNOSIS — R2689 Other abnormalities of gait and mobility: Secondary | ICD-10-CM | POA: Insufficient documentation

## 2016-11-08 DIAGNOSIS — R278 Other lack of coordination: Secondary | ICD-10-CM | POA: Diagnosis not present

## 2016-11-08 DIAGNOSIS — M25662 Stiffness of left knee, not elsewhere classified: Secondary | ICD-10-CM | POA: Insufficient documentation

## 2016-11-08 DIAGNOSIS — R29898 Other symptoms and signs involving the musculoskeletal system: Secondary | ICD-10-CM

## 2016-11-08 DIAGNOSIS — M25562 Pain in left knee: Secondary | ICD-10-CM | POA: Diagnosis not present

## 2016-11-08 NOTE — Therapy (Signed)
[place], [place], [address] Phone: [phone]   Fax:  [phone]  Physical Therapy Treatment/Discharge  Patient Details  Name: [name] MRN: [id number] Date of Birth: August 13, 1945 Referring Provider: Roxan Hockey, PA-C  Encounter Date: 11/08/2016      PT End of Session - 11/08/16 1540    Visit Number 11   Number of Visits 12   Date for PT Re-Evaluation 10/10/16   Authorization Type [employer]   Authorization Time Period 09/12/16 to 10/10/16 NEW: 10/11/16 to 11/08/16   PT Start Time 1115   PT Stop Time 1151   PT Time Calculation (min) 36 min   Equipment Utilized During Treatment Gait belt   Activity Tolerance Patient tolerated treatment well;No increased pain   Behavior During Therapy WFL for tasks assessed/performed      Past Medical History:  Diagnosis Date  . Arthritis   . Complication of anesthesia    "pt. holds her breath when waking up"  . GERD (gastroesophageal reflux disease)   . Hypertension   . Hypothyroidism   . PONV (postoperative nausea and vomiting)   . Staph infection    abdomen and returned 5 yrs. later    Past Surgical History:  Procedure Laterality Date  . ABDOMINAL HYSTERECTOMY    . CERVICAL FUSION  1990's  . COLON SURGERY    . FOOT SURGERY Left    reshaped  . HERNIA REPAIR    . JOINT REPLACEMENT Right    hip  . LUMBAR SPINE SURGERY    . REPLACEMENT TOTAL KNEE Right   . THYROID SURGERY    . TOTAL KNEE ARTHROPLASTY Left 08/21/2016   Procedure: LEFT TOTAL KNEE ARTHROPLASTY;  Surgeon: Renette Butters, MD;  Location: [facility];  Service: Orthopedics;  Laterality: Left;    There were no vitals filed for this visit.      Subjective Assessment - 11/08/16 1123    Subjective Pt reports things are going well. She is very pleased with her progress and states that the stretches she tried helped a ton. She feels that she is 110% improved, even better than she expected.     How long can you sit comfortably? unlimited    How long can you stand comfortably? 4 hours    How long can you walk comfortably? 4 hours    Patient Stated Goals improve stamina    Currently in Pain? No/denies            [facility] PT Assessment - 11/08/16 0001      Assessment   Medical Diagnosis s/p Lt TKA   Referring Provider Roxan Hockey, PA-C   Onset Date/Surgical Date 08/21/16   Next MD Visit released for the knee    Prior Therapy 9 visits of HHPT     Precautions   Precautions None     Restrictions   Weight Bearing Restrictions No     [place] residence   Living Arrangements Parent   Additional Comments ramped entrance      Prior Function   Level of [place] Retired   Leisure Currently living with her mother to provide company      Observation/Other Assessments   Focus on Therapeutic Outcomes (FOTO)  27% limited      Sensation   Light Touch Appears Intact   Semmes Weinstein Monofilament Scale --   Hot/Cold --   Additional Comments --  AROM   Right Elbow Flexion --   Right Elbow Extension --   Right Knee Extension 0   Right Knee Flexion 110   Left Knee Extension 0   Left Knee Flexion 130     Strength                                                   Right Hip Flexion 4/5   Right Hip Extension 4+/5   Right Hip ABduction 4+/5   Left Hip Flexion 4/5   Left Hip Extension 4+/5   Left Hip ABduction 4/5   Right Knee Flexion 5/5   Right Knee Extension 5/5   Left Knee Flexion 5/5   Left Knee Extension 5/5   Right Ankle Dorsiflexion 5/5   Left Ankle Dorsiflexion 5/5     Palpation   Palpation comment --     Transfers   Five time sit to stand comments  8.7 sec, no UE support     Ambulation/Gait   Gait Comments (+) step down on the Lt      Standardized Balance Assessment   Standardized Balance Assessment Timed Up and Go Test     Timed Up and Go Test   TUG Comments 7.9  sec, no AD  per most recent assessment      High Level Balance   High Level Balance Comments SLS: Lt 10 sec, Rt 7 sec              PT Short Term Goals - 11/08/16 1139      PT SHORT TERM GOAL #1   Title Pt will demo consistency and independence with her HEP to improve LE strength and proprioception.    Time 1   Period Weeks   Status Achieved           PT Long Term Goals - 11/08/16 1139      PT LONG TERM GOAL #1   Title Pt will demo improved BLE strength to atleast 4/5 MMT which will improve stability and safety with functional activity.    Time 4   Period Weeks   Status Achieved     PT LONG TERM GOAL #2   Title Pt will maintain single leg balance on each LE for atleast 15 sec without LOB, 2/3 trials, to improve safety with ambulation without an AD.   Baseline up to 10 sec    Time 4   Period Weeks   Status Partially Met     PT LONG TERM GOAL #3   Title Pt will complete 5x sit to stand in less than 10 sec, without UE support or weight shift, to demonstrate an improvement in her functional strength and power.   Baseline 8.7 sec, no UE support    Time 4   Period Weeks   Status Achieved     PT LONG TERM GOAL #4   Title Pt will demo improved endurance and activity tolerance, evident by her report of walking a minimum of 15 minutes atleast 3x/week.    Baseline pt not consistently walking, but reports 3x increase in her activity    Time 4   Period Weeks   Status Achieved               Plan - 11/08/16 1541    Clinical Impression Statement Pt was discharged this visit  having met all of her short and long term goals since beginning PT. She demonstrates full knee ROM, improved BLE strength especially with Lt hip abduction, and she has returned to all daily activity without report of increased difficulty. She has been adherent with her HEP and states that she is very pleased with her progress. Therapist noting an improvement in her gait mechanics with less noted  trendelenburg, however she does demonstrate a step down deviation possibly indicating a leg length discrepancy contributing to her remaining gait mechanical errors. She no longer requires skilled PT and is being discharged verbalizing understanding and agreement with this.    Rehab Potential Good   Clinical Impairments Affecting Rehab Potential (+) highly motivated   PT Frequency 2x / week   PT Duration 4 weeks   PT Treatment/Interventions ADLs/Self Care Home Management;Cryotherapy;Moist Heat;Gait training;Neuromuscular re-education;Balance training;Therapeutic exercise;Therapeutic activities;Functional mobility training;Stair training;Patient/family education;Manual techniques;Passive range of motion;Scar mobilization   PT Next Visit Plan d/c    PT Home Exercise Plan side stepping with green TB around knees, hip hike from step, walking 5x10 ft with glute activation, supine hip flexor stretch    Consulted and Agree with Plan of Care Patient      Patient will benefit from skilled therapeutic intervention in order to improve the following deficits and impairments:  Abnormal gait, Decreased balance, Decreased activity tolerance, Difficulty walking, Decreased strength, Decreased range of motion, Decreased scar mobility, Pain, Improper body mechanics  Visit Diagnosis: Acute pain of left knee  Stiffness of left knee, not elsewhere classified  Muscle weakness (generalized)  Other abnormalities of gait and mobility  Other symptoms and signs involving the musculoskeletal system       G-Codes - 11-16-2016 1545    Functional Assessment Tool Used (Outpatient Only) clnical judgement based on assessment of ROM, strength and balance    Functional Limitation Mobility: Walking and moving around   Mobility: Walking and Moving Around Goal Status [id number]) At least 1 percent but less than 20 percent impaired, limited or restricted   Mobility: Walking and Moving Around Discharge Status [id number]) At least 1  percent but less than 20 percent impaired, limited or restricted      Problem List Patient Active Problem List   Diagnosis Date Noted  . Primary localized osteoarthritis of left knee 08/22/2016  . Paresthesia 08/10/2016  . Weakness 08/10/2016  . Depression 07/31/2016  . S/P thyroidectomy 07/31/2016  . Primary osteoarthritis of left knee 07/31/2016  . S/P cervical spinal fusion 07/31/2016  . S/P total knee arthroplasty, right 07/31/2016  . SUBLUXATION, RADIAL HEAD 10/27/2009  . CONTUSION, ELBOW 10/27/2009    PHYSICAL THERAPY DISCHARGE SUMMARY  Visits from Start of Care: 11  Current functional level related to goals / functional outcomes: See above for more details    Remaining deficits: See above for more details    Education / Equipment: See above for more details   Plan: Patient agrees to discharge.  Patient goals were met. Patient is being discharged due to meeting the stated rehab goals.  ?????     3:48 PM,16-Nov-2016 Elly Modena PT, DPT [facility] [place] [address] [place], [place], [address] Phone: [phone]   Fax:  [phone]  Name: [name] MRN: [id number] Date of Birth: 02/23/1946

## 2016-11-12 ENCOUNTER — Encounter (HOSPITAL_COMMUNITY): Payer: Self-pay | Admitting: Occupational Therapy

## 2016-11-12 ENCOUNTER — Ambulatory Visit (HOSPITAL_COMMUNITY): Payer: PPO | Admitting: Occupational Therapy

## 2016-11-12 DIAGNOSIS — R29898 Other symptoms and signs involving the musculoskeletal system: Secondary | ICD-10-CM

## 2016-11-12 DIAGNOSIS — M25562 Pain in left knee: Secondary | ICD-10-CM | POA: Diagnosis not present

## 2016-11-12 DIAGNOSIS — R278 Other lack of coordination: Secondary | ICD-10-CM

## 2016-11-12 DIAGNOSIS — M25521 Pain in right elbow: Secondary | ICD-10-CM

## 2016-11-12 NOTE — Therapy (Signed)
[place], [place], [address] Phone: [phone]   Fax:  [phone]  Occupational Therapy Treatment  Patient Details  Name: [name] MRN: [id number] Date of Birth: 01-12-1946 Referring Provider: Dr. Edmonia Lynch  Encounter Date: 11/12/2016      OT End of Session - 11/12/16 1205    Visit Number 2   Number of Visits 8   Date for OT Re-Evaluation 12/01/16   Authorization Type [employer]   Authorization Time Period Before 10th visit   Authorization - Visit Number 2   Authorization - Number of Visits 10   OT Start Time 1032   OT Stop Time 1115   OT Time Calculation (min) 43 min   Activity Tolerance Patient tolerated treatment well   Behavior During Therapy [facility] for tasks assessed/performed      Past Medical History:  Diagnosis Date  . Arthritis   . Complication of anesthesia    "pt. holds her breath when waking up"  . GERD (gastroesophageal reflux disease)   . Hypertension   . Hypothyroidism   . PONV (postoperative nausea and vomiting)   . Staph infection    abdomen and returned 5 yrs. later    Past Surgical History:  Procedure Laterality Date  . ABDOMINAL HYSTERECTOMY    . CERVICAL FUSION  1990's  . COLON SURGERY    . FOOT SURGERY Left    reshaped  . HERNIA REPAIR    . JOINT REPLACEMENT Right    hip  . LUMBAR SPINE SURGERY    . REPLACEMENT TOTAL KNEE Right   . THYROID SURGERY    . TOTAL KNEE ARTHROPLASTY Left 08/21/2016   Procedure: LEFT TOTAL KNEE ARTHROPLASTY;  Surgeon: Renette Butters, MD;  Location: [place];  Service: Orthopedics;  Laterality: Left;    There were no vitals filed for this visit.      Subjective Assessment - 11/12/16 1028    Subjective  S: My shoulder is hurting at night when I'm sleeping.    Currently in Pain? No/denies            [facility] OT Assessment - 11/12/16 1027      Assessment   Diagnosis s/p Ulnar nerve impingment sx     Precautions    Precautions None     Restrictions   Weight Bearing Restrictions No                  OT Treatments/Exercises (OP) - 11/12/16 1034      Exercises   Exercises Elbow;Hand     Additional Elbow Exercises   Hand Gripper with Large Beads all beads gripper at 35#   Hand Gripper with Medium Beads all beads gripper at 35#     Fine Motor Coordination   Fine Motor Coordination Manipulating coins;Stacking coins   Manipulating coins Pt held 5 and 8 coins in right hand practicing bringing from palm to fingertips. Mod difficulty holding coins, pt dropped coins on 3 occasions   Stacking coins Pt held 15 coins in right hand and completed palm to fingertip translation and then stacked coins, mod difficulty, dropping coins on 1 occasion     Functional Reaching Activities   Mid Level Pt used right hand to place yellow, red, and green clothespins along vertal pinch tree focusing on pinch strength and functional reaching. Pt used lateral pinch to place and 3 point pinch to remove, min/mod difficulty     Manual Therapy   Manual  Therapy Myofascial release   Manual therapy comments completed separately from therapeutic exercise   Myofascial Release myofascial release to right volar and dorsal forearm, elbow, upper arm, and deltoid regions to decrease pain and fascial restrictions and increase joint range of motion.                 OT Education - 11/12/16 1204    Education provided Yes   Education Details provided pt with evaluation and reviewed goals.    Person(s) Educated Patient   Methods Explanation;Handout   Comprehension Verbalized understanding          OT Short Term Goals - 11/12/16 1208      OT SHORT TERM GOAL #1   Title Pt will be educated on HEP to improve ability to use RUE as dominant during B/IADL tasks.    Time 4   Period Weeks   Status On-going     OT SHORT TERM GOAL #2   Title Pt will decrease fascial restrictions from mod to minimal amounts or less to  improve mobility required for functional reaching tasks.    Time 4   Period Weeks   Status On-going     OT SHORT TERM GOAL #3   Title Pt will improve pain to 2/10 in RUE to increase ability to use RUE as dominant during daily task completion.    Time 4   Period Weeks   Status On-going     OT SHORT TERM GOAL #4   Title Pt will improve RUE strength to 4+/5 to improve ability to complete gardening tasks.    Time 4   Period Weeks   Status On-going     OT SHORT TERM GOAL #5   Title Pt will improve right grip strength by 15# and pinch strength by 4# to improve ability to hold pots/pans/plates during meal preparation.    Time 4   Period Weeks   Status On-going     OT SHORT TERM GOAL #6   Title Pt will improve RUE fine motor coordination by completing 9 hole peg test in less than 22" to improve ability to button shirts.    Time 4   Period Weeks   Status On-going                  Plan - 11/12/16 1205    Clinical Impression Statement A: Initiated myofascial release, grip and pinch strengthening, fine motor coordination and functional reaching this session. Pt reports continued difficulty with adduction of digits, notably 3rd and 4th digits. Pr reports HEP is going well, provided evaluation and reviewed.    Plan P: fine motor coordination task with pegboard   OT Home Exercise Plan 5/31: red theraputty exercises, fine motor coordination    Consulted and Agree with Plan of Care Patient      Patient will benefit from skilled therapeutic intervention in order to improve the following deficits and impairments:  Increased muscle spasms, Decreased activity tolerance, Decreased strength, Impaired sensation, Decreased range of motion, Pain, Increased edema, Impaired UE functional use, Increased fascial restricitons, Decreased coordination  Visit Diagnosis: Other symptoms and signs involving the musculoskeletal system  Pain in right elbow  Other lack of coordination    Problem  List Patient Active Problem List   Diagnosis Date Noted  . Primary localized osteoarthritis of left knee 08/22/2016  . Paresthesia 08/10/2016  . Weakness 08/10/2016  . Depression 07/31/2016  . S/P thyroidectomy 07/31/2016  . Primary osteoarthritis of left knee 07/31/2016  .  S/P cervical spinal fusion 07/31/2016  . S/P total knee arthroplasty, right 07/31/2016  . SUBLUXATION, RADIAL HEAD 10/27/2009  . CONTUSION, ELBOW 10/27/2009   Guadelupe Sabin, OTR/L  [phone] 11/12/2016, 12:09 PM  [place] [address] [place], [place], [address] Phone: [phone]   Fax:  [phone]  Name: [name] MRN: [id number] Date of Birth: 04/25/1946

## 2016-11-14 ENCOUNTER — Encounter (HOSPITAL_COMMUNITY): Payer: Self-pay | Admitting: Occupational Therapy

## 2016-11-14 ENCOUNTER — Ambulatory Visit (HOSPITAL_COMMUNITY): Payer: PPO | Admitting: Occupational Therapy

## 2016-11-14 DIAGNOSIS — R29898 Other symptoms and signs involving the musculoskeletal system: Secondary | ICD-10-CM

## 2016-11-14 DIAGNOSIS — M25521 Pain in right elbow: Secondary | ICD-10-CM

## 2016-11-14 DIAGNOSIS — M25562 Pain in left knee: Secondary | ICD-10-CM | POA: Diagnosis not present

## 2016-11-14 DIAGNOSIS — R278 Other lack of coordination: Secondary | ICD-10-CM

## 2016-11-14 NOTE — Therapy (Signed)
[place] [address] [place], [place], [address] Phone: [phone]   Fax:  [phone]  Occupational Therapy Treatment  Patient Details  Name: [name] MRN: [id number] Date of Birth: 1945-06-20 Referring Provider: Dr. Edmonia Lynch  Encounter Date: 11/14/2016      OT End of Session - 11/14/16 1203    Visit Number 3   Number of Visits 8   Date for OT Re-Evaluation 12/01/16   Authorization Type [employer]   Authorization Time Period Before 10th visit   Authorization - Visit Number 3   Authorization - Number of Visits 10   OT Start Time 1118   OT Stop Time 1200   OT Time Calculation (min) 42 min   Activity Tolerance Patient tolerated treatment well   Behavior During Therapy [facility] for tasks assessed/performed      Past Medical History:  Diagnosis Date  . Arthritis   . Complication of anesthesia    "pt. holds her breath when waking up"  . GERD (gastroesophageal reflux disease)   . Hypertension   . Hypothyroidism   . PONV (postoperative nausea and vomiting)   . Staph infection    abdomen and returned 5 yrs. later    Past Surgical History:  Procedure Laterality Date  . ABDOMINAL HYSTERECTOMY    . CERVICAL FUSION  1990's  . COLON SURGERY    . FOOT SURGERY Left    reshaped  . HERNIA REPAIR    . JOINT REPLACEMENT Right    hip  . LUMBAR SPINE SURGERY    . REPLACEMENT TOTAL KNEE Right   . THYROID SURGERY    . TOTAL KNEE ARTHROPLASTY Left 08/21/2016   Procedure: LEFT TOTAL KNEE ARTHROPLASTY;  Surgeon: Renette Butters, MD;  Location: [place];  Service: Orthopedics;  Laterality: Left;    There were no vitals filed for this visit.      Subjective Assessment - 11/14/16 1118    Subjective  S: I got groceries yesterday and I'm sore now.    Currently in Pain? Yes   Pain Score 8    Pain Location Elbow   Pain Orientation Right   Pain Descriptors / Indicators Aching;Sore   Pain Type Acute pain   Pain  Radiating Towards hand   Pain Onset More than a month ago   Pain Frequency Intermittent   Aggravating Factors  heavy lifting   Pain Relieving Factors rest   Effect of Pain on Daily Activities min effect            [facility] OT Assessment - 11/14/16 1117      Assessment   Diagnosis s/p Ulnar nerve impingment sx     Precautions   Precautions None                  OT Treatments/Exercises (OP) - 11/14/16 1121      Exercises   Exercises Elbow;Hand     Additional Elbow Exercises   Theraputty Flatten   Theraputty - Flatten red   Hand Gripper with Large Beads all beads gripper at 35#   Hand Gripper with Medium Beads all beads gripper at 35#   Hand Gripper with Small Beads 7/15 gripper at 35#; 8/15 gripper at 29#     Hand Exercises   Sponges Pt used red clothespin and 3 point pinch with right hand to grasp and place 30 sponges in to bucket. Min difficulty. Attempted green clothespin however pt unable to complete  Fine Motor Coordination   Fine Motor Coordination Small Pegboard   Small Pegboard Pt held pegs in right hand and practiced palm to fingertip translation, placing pegs in pegboard with min/mod difficulty     Manual Therapy   Manual Therapy Myofascial release   Manual therapy comments completed separately from therapeutic exercise   Myofascial Release myofascial release to right volar and dorsal forearm, elbow, upper arm, and deltoid regions to decrease pain and fascial restrictions and increase joint range of motion.                   OT Short Term Goals - 11/12/16 1208      OT SHORT TERM GOAL #1   Title Pt will be educated on HEP to improve ability to use RUE as dominant during B/IADL tasks.    Time 4   Period Weeks   Status On-going     OT SHORT TERM GOAL #2   Title Pt will decrease fascial restrictions from mod to minimal amounts or less to improve mobility required for functional reaching tasks.    Time 4   Period Weeks   Status  On-going     OT SHORT TERM GOAL #3   Title Pt will improve pain to 2/10 in RUE to increase ability to use RUE as dominant during daily task completion.    Time 4   Period Weeks   Status On-going     OT SHORT TERM GOAL #4   Title Pt will improve RUE strength to 4+/5 to improve ability to complete gardening tasks.    Time 4   Period Weeks   Status On-going     OT SHORT TERM GOAL #5   Title Pt will improve right grip strength by 15# and pinch strength by 4# to improve ability to hold pots/pans/plates during meal preparation.    Time 4   Period Weeks   Status On-going     OT SHORT TERM GOAL #6   Title Pt will improve RUE fine motor coordination by completing 9 hole peg test in less than 22" to improve ability to button shirts.    Time 4   Period Weeks   Status On-going                  Plan - 11/14/16 1203    Clinical Impression Statement A: Continued with grip and pinch strengthening, added fine motor activity with pegboard. Pt reports increased soreness after getting several heavy grocery bags yesterday. Pt reports improvement in soreness after manual therapy.    Plan P: Continue with grip and pinch strengthening, complete pegboard activity with tweezers.    OT Home Exercise Plan 5/31: red theraputty exercises, fine motor coordination    Consulted and Agree with Plan of Care Patient      Patient will benefit from skilled therapeutic intervention in order to improve the following deficits and impairments:  Increased muscle spasms, Decreased activity tolerance, Decreased strength, Impaired sensation, Decreased range of motion, Pain, Increased edema, Impaired UE functional use, Increased fascial restricitons, Decreased coordination  Visit Diagnosis: Other symptoms and signs involving the musculoskeletal system  Pain in right elbow  Other lack of coordination    Problem List Patient Active Problem List   Diagnosis Date Noted  . Primary localized osteoarthritis of  left knee 08/22/2016  . Paresthesia 08/10/2016  . Weakness 08/10/2016  . Depression 07/31/2016  . S/P thyroidectomy 07/31/2016  . Primary osteoarthritis of left knee 07/31/2016  . S/P cervical  spinal fusion 07/31/2016  . S/P total knee arthroplasty, right 07/31/2016  . SUBLUXATION, RADIAL HEAD 10/27/2009  . CONTUSION, ELBOW 10/27/2009   Guadelupe Sabin, OTR/L  [phone] 11/14/2016, 12:06 PM  [facility] [address] [place], [place], [address] Phone: [phone]   Fax:  [phone]  Name: [name] MRN: [id number] Date of Birth: Apr 08, 1946

## 2016-11-15 ENCOUNTER — Encounter (HOSPITAL_COMMUNITY): Payer: PPO | Admitting: Physical Therapy

## 2016-11-19 ENCOUNTER — Ambulatory Visit (HOSPITAL_COMMUNITY): Payer: PPO

## 2016-11-19 ENCOUNTER — Encounter (HOSPITAL_COMMUNITY): Payer: Self-pay

## 2016-11-19 DIAGNOSIS — M25562 Pain in left knee: Secondary | ICD-10-CM | POA: Diagnosis not present

## 2016-11-19 DIAGNOSIS — R29898 Other symptoms and signs involving the musculoskeletal system: Secondary | ICD-10-CM

## 2016-11-19 DIAGNOSIS — R278 Other lack of coordination: Secondary | ICD-10-CM

## 2016-11-19 DIAGNOSIS — M25521 Pain in right elbow: Secondary | ICD-10-CM

## 2016-11-19 NOTE — Therapy (Signed)
[place], [place], [address] Phone: [phone]   Fax:  [phone]  Occupational Therapy Treatment  Patient Details  Name: [name] MRN: [id number] Date of Birth: Feb 26, 1946 Referring Provider: Dr. Edmonia Lynch  Encounter Date: 11/19/2016      OT End of Session - 11/19/16 1514    Visit Number 4   Number of Visits 8   Date for OT Re-Evaluation 12/01/16   Authorization Type [employer]   Authorization Time Period Before 10th visit   Authorization - Visit Number 4   Authorization - Number of Visits 10   OT Start Time 1740   OT Stop Time 1515   OT Time Calculation (min) 44 min   Activity Tolerance Patient tolerated treatment well   Behavior During Therapy [facility] for tasks assessed/performed      Past Medical History:  Diagnosis Date  . Arthritis   . Complication of anesthesia    "pt. holds her breath when waking up"  . GERD (gastroesophageal reflux disease)   . Hypertension   . Hypothyroidism   . PONV (postoperative nausea and vomiting)   . Staph infection    abdomen and returned 5 yrs. later    Past Surgical History:  Procedure Laterality Date  . ABDOMINAL HYSTERECTOMY    . CERVICAL FUSION  1990's  . COLON SURGERY    . FOOT SURGERY Left    reshaped  . HERNIA REPAIR    . JOINT REPLACEMENT Right    hip  . LUMBAR SPINE SURGERY    . REPLACEMENT TOTAL KNEE Right   . THYROID SURGERY    . TOTAL KNEE ARTHROPLASTY Left 08/21/2016   Procedure: LEFT TOTAL KNEE ARTHROPLASTY;  Surgeon: Renette Butters, MD;  Location: [place];  Service: Orthopedics;  Laterality: Left;    There were no vitals filed for this visit.      Subjective Assessment - 11/19/16 1437    Subjective  S: My arm feels swelled at the elbow and wrist. The doctor worked on the bottom part so I'm not sure why my shoulder is hurting.   Currently in Pain? Yes   Pain Score 9    Pain Location Shoulder   Pain Orientation  Right   Pain Descriptors / Indicators Sore;Constant   Pain Type Acute pain   Pain Radiating Towards N/A   Pain Onset More than a month ago   Pain Frequency Constant   Aggravating Factors  lifintg objetcs   Pain Relieving Factors rest, heat   Effect of Pain on Daily Activities min effect   Multiple Pain Sites No            [facility] OT Assessment - 11/19/16 1439      Assessment   Diagnosis s/p Ulnar nerve impingment sx     Precautions   Precautions None                  OT Treatments/Exercises (OP) - 11/19/16 1440      Exercises   Exercises Elbow;Hand     Additional Elbow Exercises   Theraputty Flatten;Roll;Grip;Pinch;Locate Pegs   Theraputty - Flatten red   Theraputty - Roll red   Theraputty - Grip red     Hand Exercises   Theraputty Flatten;Roll;Grip;Pinch;Locate Pegs  Used PVC pipe to make circles in putty   Theraputty - Pinch red   Theraputty - Locate Pegs red   Small Pegboard Pt completed small pegboard activity picking up pegs and  inserting them into board with tweezers. Then pt removed al pegs and put into bucket.     Manual Therapy   Manual Therapy Myofascial release   Manual therapy comments completed separately from therapeutic exercise   Myofascial Release myofascial release to right upper arm and deltoid regions to decrease pain and fascial restrictions and increase joint range of motion.                 OT Education - 11/19/16 1505    Education provided Yes   Education Details educated pt on myofascial release and that she can do self-massage at home   Person(s) Educated Patient   Methods Explanation;Demonstration   Comprehension Returned demonstration;Verbalized understanding          OT Short Term Goals - 11/12/16 1208      OT SHORT TERM GOAL #1   Title Pt will be educated on HEP to improve ability to use RUE as dominant during B/IADL tasks.    Time 4   Period Weeks   Status On-going     OT SHORT TERM GOAL #2   Title  Pt will decrease fascial restrictions from mod to minimal amounts or less to improve mobility required for functional reaching tasks.    Time 4   Period Weeks   Status On-going     OT SHORT TERM GOAL #3   Title Pt will improve pain to 2/10 in RUE to increase ability to use RUE as dominant during daily task completion.    Time 4   Period Weeks   Status On-going     OT SHORT TERM GOAL #4   Title Pt will improve RUE strength to 4+/5 to improve ability to complete gardening tasks.    Time 4   Period Weeks   Status On-going     OT SHORT TERM GOAL #5   Title Pt will improve right grip strength by 15# and pinch strength by 4# to improve ability to hold pots/pans/plates during meal preparation.    Time 4   Period Weeks   Status On-going     OT SHORT TERM GOAL #6   Title Pt will improve RUE fine motor coordination by completing 9 hole peg test in less than 22" to improve ability to button shirts.    Time 4   Period Weeks   Status On-going                  Plan - 11/19/16 1506    Clinical Impression Statement A: Pt began session with increased pain of 9/10. Manual therapy performed to address pain and patient's pain reduced to 3/10 afterwards. Small pegboard activity with tweezers completed with min difficulty. Pt completed theraputty activity with VC needed for form and technique.   Plan P: Complete grooved pegboard activity with tweezers. Continue with grip and pinch strengthening.       Patient will benefit from skilled therapeutic intervention in order to improve the following deficits and impairments:  Increased muscle spasms, Decreased activity tolerance, Decreased strength, Impaired sensation, Decreased range of motion, Pain, Increased edema, Impaired UE functional use, Increased fascial restricitons, Decreased coordination  Visit Diagnosis: Other symptoms and signs involving the musculoskeletal system  Pain in right elbow  Other lack of coordination    Problem  List Patient Active Problem List   Diagnosis Date Noted  . Primary localized osteoarthritis of left knee 08/22/2016  . Paresthesia 08/10/2016  . Weakness 08/10/2016  . Depression 07/31/2016  . S/P thyroidectomy  07/31/2016  . Primary osteoarthritis of left knee 07/31/2016  . S/P cervical spinal fusion 07/31/2016  . S/P total knee arthroplasty, right 07/31/2016  . SUBLUXATION, RADIAL HEAD 10/27/2009  . CONTUSION, ELBOW 10/27/2009    Luther Hearing, OT Student [phone] 11/19/2016, 4:22 PM  [place], [place], [address] Phone: [phone]   Fax:  [phone]  Name: CLISTA RAINFORD MRN: [id number] Date of Birth: 1945/11/17     This qualified practitioner was present in the room guiding the student in service delivery. Therapy student was participating in the provision of services, and the practitioner was not engaged in treating another patient or doing other tasks at the same time.  Ailene Ravel, OTR/L,CBIS  [phone]

## 2016-11-21 ENCOUNTER — Ambulatory Visit (HOSPITAL_COMMUNITY): Payer: PPO | Admitting: Specialist

## 2016-11-21 ENCOUNTER — Encounter (HOSPITAL_COMMUNITY): Payer: Self-pay | Admitting: Specialist

## 2016-11-21 DIAGNOSIS — M25562 Pain in left knee: Secondary | ICD-10-CM | POA: Diagnosis not present

## 2016-11-21 DIAGNOSIS — M25521 Pain in right elbow: Secondary | ICD-10-CM

## 2016-11-21 DIAGNOSIS — R278 Other lack of coordination: Secondary | ICD-10-CM

## 2016-11-21 DIAGNOSIS — R29898 Other symptoms and signs involving the musculoskeletal system: Secondary | ICD-10-CM

## 2016-11-21 NOTE — Therapy (Signed)
[place], [place], [address] Phone: [phone]   Fax:  [phone]  Occupational Therapy Treatment  Patient Details  Name: [name] MRN: [id number] Date of Birth: 1945-06-16 Referring Provider: Dr. Edmonia Lynch  Encounter Date: 11/21/2016      OT End of Session - 11/21/16 1522    Visit Number 5   Number of Visits 8   Date for OT Re-Evaluation 12/01/16   Authorization Type [employer]   Authorization Time Period Before 10th visit   Authorization - Visit Number 5   Authorization - Number of Visits 10   OT Start Time 1430   OT Stop Time 1515   OT Time Calculation (min) 45 min   Activity Tolerance Patient tolerated treatment well   Behavior During Therapy [facility] for tasks assessed/performed      Past Medical History:  Diagnosis Date  . Arthritis   . Complication of anesthesia    "pt. holds her breath when waking up"  . GERD (gastroesophageal reflux disease)   . Hypertension   . Hypothyroidism   . PONV (postoperative nausea and vomiting)   . Staph infection    abdomen and returned 5 yrs. later    Past Surgical History:  Procedure Laterality Date  . ABDOMINAL HYSTERECTOMY    . CERVICAL FUSION  1990's  . COLON SURGERY    . FOOT SURGERY Left    reshaped  . HERNIA REPAIR    . JOINT REPLACEMENT Right    hip  . LUMBAR SPINE SURGERY    . REPLACEMENT TOTAL KNEE Right   . THYROID SURGERY    . TOTAL KNEE ARTHROPLASTY Left 08/21/2016   Procedure: LEFT TOTAL KNEE ARTHROPLASTY;  Surgeon: Renette Butters, MD;  Location: [place];  Service: Orthopedics;  Laterality: Left;    There were no vitals filed for this visit.      Subjective Assessment - 11/21/16 1520    Subjective  S:  My wrist and elbow feel much better now that you have worked on them.  Before they wouldnt lay down all the way.     Currently in Pain? Yes   Pain Score 5    Pain Location Shoulder   Pain Orientation Right   Pain  Descriptors / Indicators Constant;Sore   Pain Onset More than a month ago   Pain Frequency Constant            [facility] OT Assessment - 11/21/16 0001      Assessment   Diagnosis s/p Ulnar nerve impingment sx     Precautions   Precautions None                  OT Treatments/Exercises (OP) - 11/21/16 0001      Exercises   Exercises Elbow;Hand     Elbow Exercises   Elbow Flexion PROM;10 reps   Elbow Extension PROM;10 reps   Forearm Supination PROM;10 reps   Forearm Pronation PROM;10 reps   Wrist Flexion PROM;10 reps   Wrist Extension PROM;10 reps     Additional Elbow Exercises   Theraputty Flatten;Roll;Grip;Pinch;Locate Pegs   Theraputty - Flatten red in standing  wrsit stability pull with pvc pipe   Theraputty - Roll red   Theraputty - Grip red supinated and pronated grasp     Manual Therapy   Manual Therapy Myofascial release   Manual therapy comments completed separately from therapeutic exercise   Myofascial Release myofascial release to right upper arm  and deltoid regions to decrease pain and fascial restrictions and increase joint range of motion.   myofascial release to right elbow region, foearm, to decrease pain and fascial restrictions                 OT Education - 11/21/16 1521    Education provided Yes   Education Details educated on use of tennis ball for self massage to scapular region.    Person(s) Educated Patient   Methods Explanation;Demonstration   Comprehension Returned demonstration;Verbalized understanding          OT Short Term Goals - 11/12/16 1208      OT SHORT TERM GOAL #1   Title Pt will be educated on HEP to improve ability to use RUE as dominant during B/IADL tasks.    Time 4   Period Weeks   Status On-going     OT SHORT TERM GOAL #2   Title Pt will decrease fascial restrictions from mod to minimal amounts or less to improve mobility required for functional reaching tasks.    Time 4   Period Weeks   Status  On-going     OT SHORT TERM GOAL #3   Title Pt will improve pain to 2/10 in RUE to increase ability to use RUE as dominant during daily task completion.    Time 4   Period Weeks   Status On-going     OT SHORT TERM GOAL #4   Title Pt will improve RUE strength to 4+/5 to improve ability to complete gardening tasks.    Time 4   Period Weeks   Status On-going     OT SHORT TERM GOAL #5   Title Pt will improve right grip strength by 15# and pinch strength by 4# to improve ability to hold pots/pans/plates during meal preparation.    Time 4   Period Weeks   Status On-going     OT SHORT TERM GOAL #6   Title Pt will improve RUE fine motor coordination by completing 9 hole peg test in less than 22" to improve ability to button shirts.    Time 4   Period Weeks   Status On-going                  Plan - 11/21/16 1600    Clinical Impression Statement A:  patient has improved a/rom into full elbow extension after myofascial release intervention this date.  added wrist stability pull with pvc pipe for strengthening with theraputty exercises.    Plan P:  complete grooved pegboard activity with tweezers.  continue myofascial release to elbow and forearm region.        Patient will benefit from skilled therapeutic intervention in order to improve the following deficits and impairments:     Visit Diagnosis: Other symptoms and signs involving the musculoskeletal system  Pain in right elbow  Other lack of coordination    Problem List Patient Active Problem List   Diagnosis Date Noted  . Primary localized osteoarthritis of left knee 08/22/2016  . Paresthesia 08/10/2016  . Weakness 08/10/2016  . Depression 07/31/2016  . S/P thyroidectomy 07/31/2016  . Primary osteoarthritis of left knee 07/31/2016  . S/P cervical spinal fusion 07/31/2016  . S/P total knee arthroplasty, right 07/31/2016  . SUBLUXATION, RADIAL HEAD 10/27/2009  . CONTUSION, ELBOW 10/27/2009    Vangie Bicker, [place], OTR/L [phone]  11/21/2016, 4:02 PM  [place] [address] [place], [place], [address]  Phone: [phone]   Fax:  [phone]  Name: [name] MRN: [id number] Date of Birth: 07/14/1945

## 2016-11-23 DIAGNOSIS — M79671 Pain in right foot: Secondary | ICD-10-CM | POA: Diagnosis not present

## 2016-11-23 DIAGNOSIS — M2022 Hallux rigidus, left foot: Secondary | ICD-10-CM | POA: Diagnosis not present

## 2016-11-23 DIAGNOSIS — M79672 Pain in left foot: Secondary | ICD-10-CM | POA: Diagnosis not present

## 2016-11-23 DIAGNOSIS — M21611 Bunion of right foot: Secondary | ICD-10-CM | POA: Diagnosis not present

## 2016-11-26 ENCOUNTER — Ambulatory Visit (HOSPITAL_COMMUNITY): Payer: PPO

## 2016-11-26 ENCOUNTER — Encounter (HOSPITAL_COMMUNITY): Payer: Self-pay

## 2016-11-26 DIAGNOSIS — R29898 Other symptoms and signs involving the musculoskeletal system: Secondary | ICD-10-CM

## 2016-11-26 DIAGNOSIS — R278 Other lack of coordination: Secondary | ICD-10-CM

## 2016-11-26 DIAGNOSIS — M25562 Pain in left knee: Secondary | ICD-10-CM | POA: Diagnosis not present

## 2016-11-26 DIAGNOSIS — M25521 Pain in right elbow: Secondary | ICD-10-CM

## 2016-11-26 NOTE — Therapy (Signed)
[place], [place], [address] Phone: [phone]   Fax:  [phone]  Occupational Therapy Treatment  Patient Details  Name: [name] MRN: [id number] Date of Birth: [birth year] Referring Provider: Dr. Edmonia Lynch  Encounter Date: 11/26/2016      OT End of Session - 11/26/16 1113    Visit Number 6   Number of Visits 8   Date for OT Re-Evaluation 12/01/16   Authorization Type [employer]   Authorization Time Period Before 10th visit   Authorization - Visit Number 6   Authorization - Number of Visits 10   OT Start Time 1033   OT Stop Time 1113   OT Time Calculation (min) 40 min   Activity Tolerance Patient tolerated treatment well   Behavior During Therapy [facility] for tasks assessed/performed      Past Medical History:  Diagnosis Date  . Arthritis   . Complication of anesthesia    "pt. holds her breath when waking up"  . GERD (gastroesophageal reflux disease)   . Hypertension   . Hypothyroidism   . PONV (postoperative nausea and vomiting)   . Staph infection    abdomen and returned 5 yrs. later    Past Surgical History:  Procedure Laterality Date  . ABDOMINAL HYSTERECTOMY    . CERVICAL FUSION  1990's  . COLON SURGERY    . FOOT SURGERY Left    reshaped  . HERNIA REPAIR    . JOINT REPLACEMENT Right    hip  . LUMBAR SPINE SURGERY    . REPLACEMENT TOTAL KNEE Right   . THYROID SURGERY    . TOTAL KNEE ARTHROPLASTY Left 08/21/2016   Procedure: LEFT TOTAL KNEE ARTHROPLASTY;  Surgeon: Renette Butters, MD;  Location: [place];  Service: Orthopedics;  Laterality: Left;    There were no vitals filed for this visit.      Subjective Assessment - 11/26/16 1035    Subjective  S: My arm is just sore enough to know that it is there.   Currently in Pain? Yes   Pain Score 3    Pain Location Shoulder   Pain Orientation Right   Pain Descriptors / Indicators Constant;Sore   Pain Type Acute pain    Pain Radiating Towards elbow   Pain Onset More than a month ago   Pain Frequency Constant   Aggravating Factors  lifting objects   Pain Relieving Factors rest, heat   Effect of Pain on Daily Activities min effect   Multiple Pain Sites No            [facility] OT Assessment - 11/26/16 1053      Assessment   Diagnosis s/p Ulnar nerve impingment sx     Precautions   Precautions None                  OT Treatments/Exercises (OP) - 11/26/16 1053      Exercises   Exercises Elbow;Hand     Elbow Exercises   Elbow Flexion AROM;10 reps   Elbow Extension AROM;10 reps   Forearm Supination AROM;10 reps   Forearm Pronation AROM;10 reps   Wrist Flexion AROM;10 reps   Wrist Extension AROM;10 reps     Hand Exercises   Sponges Pt picked up soft sponges in hand one at a time using 3-point pinch: 12, 17. Pt used red clothes pin and 3-point pinch to grasp hard sponges and stack as high as possible, attempted green clothes pin  but was unsuccessful.   Other Hand Exercises Pt used Eggsercizer eggs to target grip strengthening. Started with the softest, then soft, then to medium, finished with firm. Squeezed each egg 10 times.     Fine Motor Coordination   Fine Motor Coordination Grooved pegs   Grooved pegs Pt completed placing all pegs in board using tweezers and removed.     Manual Therapy   Manual Therapy Myofascial release   Manual therapy comments completed separately from therapeutic exercise   Myofascial Release myofascial release to right upper arm and deltoid regions to decrease pain and fascial restrictions and increase joint range of motion.   myofascial release to right elbow region, foearm, to decrease pain and fascial restrictions                 OT Education - 11/26/16 1110    Education provided No          OT Short Term Goals - 11/12/16 1208      OT SHORT TERM GOAL #1   Title Pt will be educated on HEP to improve ability to use RUE as dominant  during B/IADL tasks.    Time 4   Period Weeks   Status On-going     OT SHORT TERM GOAL #2   Title Pt will decrease fascial restrictions from mod to minimal amounts or less to improve mobility required for functional reaching tasks.    Time 4   Period Weeks   Status On-going     OT SHORT TERM GOAL #3   Title Pt will improve pain to 2/10 in RUE to increase ability to use RUE as dominant during daily task completion.    Time 4   Period Weeks   Status On-going     OT SHORT TERM GOAL #4   Title Pt will improve RUE strength to 4+/5 to improve ability to complete gardening tasks.    Time 4   Period Weeks   Status On-going     OT SHORT TERM GOAL #5   Title Pt will improve right grip strength by 15# and pinch strength by 4# to improve ability to hold pots/pans/plates during meal preparation.    Time 4   Period Weeks   Status On-going     OT SHORT TERM GOAL #6   Title Pt will improve RUE fine motor coordination by completing 9 hole peg test in less than 22" to improve ability to button shirts.    Time 4   Period Weeks   Status On-going                  Plan - 11/26/16 1110    Clinical Impression Statement A: Pt reports myofascial release relieves tension feeling in right arm. Completed grooved peg board using tweezers and increased time was needed. VC needed for form and tecnique pinch when picking up sponges. Eggsercizer eggs used to focus on grip strengthening, pt fatigued in hand by the last egg.   Plan P: Reassessment. Recommend more therapy if needed. Continue myofascial release to elbow and forearm region. Add nuts and bolts twisting activity.      Patient will benefit from skilled therapeutic intervention in order to improve the following deficits and impairments:  Increased muscle spasms, Decreased activity tolerance, Decreased strength, Impaired sensation, Decreased range of motion, Pain, Increased edema, Impaired UE functional use, Increased fascial restricitons,  Decreased coordination  Visit Diagnosis: Other symptoms and signs involving the musculoskeletal system  Pain in right elbow  Other lack of coordination    Problem List Patient Active Problem List   Diagnosis Date Noted  . Primary localized osteoarthritis of left knee 08/22/2016  . Paresthesia 08/10/2016  . Weakness 08/10/2016  . Depression 07/31/2016  . S/P thyroidectomy 07/31/2016  . Primary osteoarthritis of left knee 07/31/2016  . S/P cervical spinal fusion 07/31/2016  . S/P total knee arthroplasty, right 07/31/2016  . SUBLUXATION, RADIAL HEAD 10/27/2009  . CONTUSION, ELBOW 10/27/2009    Luther Hearing, OT Student [phone] 11/26/2016, 11:23 AM  [place], [place], [address] Phone: [phone]   Fax:  [phone]  Name: [name] MRN: [id number] Date of Birth: [birth year]     This qualified practitioner was present in the room guiding the student in service delivery. Therapy student was participating in the provision of services, and the practitioner was not engaged in treating another patient or doing other tasks at the same time.  Ailene Ravel, OTR/L,CBIS  [phone]

## 2016-11-28 ENCOUNTER — Ambulatory Visit (HOSPITAL_COMMUNITY): Payer: PPO | Admitting: Occupational Therapy

## 2016-11-28 ENCOUNTER — Encounter (HOSPITAL_COMMUNITY): Payer: Self-pay | Admitting: Occupational Therapy

## 2016-11-28 DIAGNOSIS — M25562 Pain in left knee: Secondary | ICD-10-CM | POA: Diagnosis not present

## 2016-11-28 DIAGNOSIS — R278 Other lack of coordination: Secondary | ICD-10-CM

## 2016-11-28 DIAGNOSIS — R29898 Other symptoms and signs involving the musculoskeletal system: Secondary | ICD-10-CM

## 2016-11-28 DIAGNOSIS — M25521 Pain in right elbow: Secondary | ICD-10-CM

## 2016-11-28 NOTE — Therapy (Addendum)
[place], [place], [address] Phone: [phone]   Fax:  [phone]  Occupational Therapy Reassessment and Treatment (recertification)  Patient Details  Name: [name] MRN: [id number] Date of Birth: 07/27/1945 Referring Provider: Dr. Edmonia Lynch  Encounter Date: 11/28/2016      OT End of Session - 11/28/16 1345    Visit Number 7   Number of Visits 11   Date for OT Re-Evaluation 12/21/16   Authorization Type [employer]   Authorization Time Period Before 10th visit   Authorization - Visit Number 7   Authorization - Number of Visits 10   OT Start Time 9030   OT Stop Time 1344   OT Time Calculation (min) 41 min   Activity Tolerance Patient tolerated treatment well   Behavior During Therapy [facility] for tasks assessed/performed      Past Medical History:  Diagnosis Date  . Arthritis   . Complication of anesthesia    "pt. holds her breath when waking up"  . GERD (gastroesophageal reflux disease)   . Hypertension   . Hypothyroidism   . PONV (postoperative nausea and vomiting)   . Staph infection    abdomen and returned 5 yrs. later    Past Surgical History:  Procedure Laterality Date  . ABDOMINAL HYSTERECTOMY    . CERVICAL FUSION  1990's  . COLON SURGERY    . FOOT SURGERY Left    reshaped  . HERNIA REPAIR    . JOINT REPLACEMENT Right    hip  . LUMBAR SPINE SURGERY    . REPLACEMENT TOTAL KNEE Right   . THYROID SURGERY    . TOTAL KNEE ARTHROPLASTY Left 08/21/2016   Procedure: LEFT TOTAL KNEE ARTHROPLASTY;  Surgeon: Renette Butters, MD;  Location: [place];  Service: Orthopedics;  Laterality: Left;    There were no vitals filed for this visit.      Subjective Assessment - 11/28/16 1322    Subjective  S: I never was able to hang clothes with my right hand so that's why it could be weaker.   Currently in Pain? No/denies            [facility] OT Assessment - 11/28/16 1304       Assessment   Diagnosis s/p Ulnar nerve impingment sx     Precautions   Precautions None     Sensation   Semmes Weinstein Monofilament Scale Diminshed Protective  previous: 3.84-4.31     Coordination   9 Hole Peg Test Right;Left   Right 9 Hole Peg Test 20.92"  previous: 26.04   Left 9 Hole Peg Test 25.63"  previous: 24.30     ROM / Strength   AROM / PROM / Strength AROM;PROM;Strength     Strength   Strength Assessment Site Elbow;Wrist   Right/Left Elbow Right   Right Elbow Flexion 5/5  previous: 4/5   Right Elbow Extension 5/5  previous: 4-/5   Right/Left Forearm Right   Right Forearm Pronation 5/5  previous: 4-/5   Right Forearm Supination 5/5  previous: 3+/5   Right/Left Wrist Right   Right Wrist Flexion 4+/5  previous: 4/5   Right Wrist Extension 5/5  previous: 4+/5   Right Wrist Radial Deviation 5/5  previous: 4/5   Right Wrist Ulnar Deviation 5/5  previous: 4/5   Right/Left hand Right   Right Hand Grip (lbs) 50  previous: 45   Right Hand Lateral Pinch 6 lbs  previous: 8  Right Hand 3 Point Pinch 5 lbs  previous: 7                  OT Treatments/Exercises (OP) - 11/28/16 1323      Exercises   Exercises Elbow;Hand     Additional Elbow Exercises   Theraputty Flatten;Roll;Grip;Pinch;Locate Pegs   Theraputty - Flatten red in standing   Theraputty - Roll red   Theraputty - Grip red     Hand Exercises   Theraputty Flatten;Roll;Grip;Pinch;Locate Pegs   Theraputty - Pinch red   Theraputty - Locate Pegs red   Other Hand Exercises Pt used Eggsercizer eggs to target grip strengthening. Started with the softest, then soft, then to medium, finished with firm. Squeezed each egg 10 times.                OT Education - 11/28/16 1323    Education provided Yes   Education Details Educated pt on met goal and need for 2 more weeks of therapy   Person(s) Educated Patient   Methods Explanation   Comprehension Verbalized understanding           OT Short Term Goals - 11/28/16 1319      OT SHORT TERM GOAL #1   Title Pt will be educated on HEP to improve ability to use RUE as dominant during B/IADL tasks.    Time 4   Period Weeks   Status Achieved     OT SHORT TERM GOAL #2   Title Pt will decrease fascial restrictions from mod to minimal amounts or less to improve mobility required for functional reaching tasks.    Time 4   Period Weeks   Status On-going     OT SHORT TERM GOAL #3   Title Pt will improve pain to 2/10 in RUE to increase ability to use RUE as dominant during daily task completion.    Time 4   Period Weeks   Status Achieved     OT SHORT TERM GOAL #4   Title Pt will improve RUE strength to 4+/5 to improve ability to complete gardening tasks.    Time 4   Period Weeks   Status Achieved     OT SHORT TERM GOAL #5   Title Pt will improve right grip strength by 15# and pinch strength by 4# to improve ability to hold pots/pans/plates during meal preparation.    Time 4   Period Weeks   Status On-going     OT SHORT TERM GOAL #6   Title Pt will improve RUE fine motor coordination by completing 9 hole peg test in less than 22" to improve ability to button shirts.    Time 4   Period Weeks   Status Achieved                  Plan - 11/28/16 1631    Clinical Impression Statement A: Reassessment completed today. Pt achieved 4 short term goals with 2 still on-going. Pt has improved pain, arm strength and coordination in the RUE but still needs improvement with grip and pinch strength as well as her RUE fascial restrictions. Encouraged pt to continue with HEP and to continue with therapy.    Rehab Potential Good   OT Frequency 2x / week   OT Duration 2 weeks   OT Treatment/Interventions Self-care/ADL training;Therapeutic exercise;Patient/family education;Ultrasound;Manual Therapy;Therapeutic activities;Cryotherapy;Electrical Stimulation;Moist Heat;Passive range of motion   Plan P: Continue with  skilled OT services 2x/week for 2 additional weeks. Continue myofascial  release to elbow and forearm regions. Focus on grip and pinch strengthening. Update G-codes.    OT Home Exercise Plan 5/31: red theraputty exercises, fine motor coordination    Consulted and Agree with Plan of Care Patient      Patient will benefit from skilled therapeutic intervention in order to improve the following deficits and impairments:  Increased muscle spasms, Decreased activity tolerance, Decreased strength, Impaired sensation, Decreased range of motion, Pain, Increased edema, Impaired UE functional use, Increased fascial restricitons, Decreased coordination  Visit Diagnosis: Other symptoms and signs involving the musculoskeletal system  Pain in right elbow  Other lack of coordination    Problem List Patient Active Problem List   Diagnosis Date Noted  . Primary localized osteoarthritis of left knee 08/22/2016  . Paresthesia 08/10/2016  . Weakness 08/10/2016  . Depression 07/31/2016  . S/P thyroidectomy 07/31/2016  . Primary osteoarthritis of left knee 07/31/2016  . S/P cervical spinal fusion 07/31/2016  . S/P total knee arthroplasty, right 07/31/2016  . SUBLUXATION, RADIAL HEAD 10/27/2009  . CONTUSION, ELBOW 10/27/2009    Luther Hearing, OT Student [phone] 11/28/2016, 4:48 PM  [place], [place], [address] Phone: [phone]   Fax:  [phone]  Name: [name] MRN: [id number] Date of Birth: 12/23/1945    This qualified practitioner was present in the room guiding the student in service delivery. Therapy student was participating in the provision of services, and the practitioner was not engaged in treating another patient or doing other tasks at the same time.  Guadelupe Sabin, OTR/L  [phone] 11/28/2016

## 2016-12-12 ENCOUNTER — Encounter (HOSPITAL_COMMUNITY): Payer: Self-pay

## 2016-12-12 ENCOUNTER — Ambulatory Visit (HOSPITAL_COMMUNITY): Payer: PPO | Attending: Orthopedic Surgery

## 2016-12-12 DIAGNOSIS — R278 Other lack of coordination: Secondary | ICD-10-CM | POA: Diagnosis not present

## 2016-12-12 DIAGNOSIS — R29898 Other symptoms and signs involving the musculoskeletal system: Secondary | ICD-10-CM

## 2016-12-12 DIAGNOSIS — M25521 Pain in right elbow: Secondary | ICD-10-CM | POA: Insufficient documentation

## 2016-12-12 NOTE — Therapy (Signed)
[place], [place], [address] Phone: [phone]   Fax:  [phone]  Occupational Therapy Treatment  Patient Details  Name: [name] MRN: [id number] Date of Birth: Jul 01, 1945 Referring Provider: Dr. Edmonia Lynch  Encounter Date: 12/12/2016      OT End of Session - 12/12/16 1512    Visit Number 8   Number of Visits 11   Date for OT Re-Evaluation 12/21/16   Authorization Type [employer]   Authorization Time Period Before 18th visit   Authorization - Visit Number 8   Authorization - Number of Visits 18   OT Start Time 1430   OT Stop Time 1515   OT Time Calculation (min) 45 min   Activity Tolerance Patient tolerated treatment well   Behavior During Therapy [facility] for tasks assessed/performed      Past Medical History:  Diagnosis Date  . Arthritis   . Complication of anesthesia    "pt. holds her breath when waking up"  . GERD (gastroesophageal reflux disease)   . Hypertension   . Hypothyroidism   . PONV (postoperative nausea and vomiting)   . Staph infection    abdomen and returned 5 yrs. later    Past Surgical History:  Procedure Laterality Date  . ABDOMINAL HYSTERECTOMY    . CERVICAL FUSION  1990's  . COLON SURGERY    . FOOT SURGERY Left    reshaped  . HERNIA REPAIR    . JOINT REPLACEMENT Right    hip  . LUMBAR SPINE SURGERY    . REPLACEMENT TOTAL KNEE Right   . THYROID SURGERY    . TOTAL KNEE ARTHROPLASTY Left 08/21/2016   Procedure: LEFT TOTAL KNEE ARTHROPLASTY;  Surgeon: Renette Butters, MD;  Location: [place];  Service: Orthopedics;  Laterality: Left;    There were no vitals filed for this visit.      Subjective Assessment - 12/12/16 1433    Subjective  S: It gets sore sometimes on the back of my elbow but it hasn't yet today.   Currently in Pain? No/denies            [facility] OT Assessment - 12/12/16 1434      Assessment   Diagnosis s/p Ulnar nerve impingment  sx     Precautions   Precautions None                  OT Treatments/Exercises (OP) - 12/12/16 1434      Exercises   Exercises Elbow;Hand     Elbow Exercises   Elbow Flexion AROM;10 reps   Elbow Extension AROM;10 reps   Forearm Supination AROM;10 reps   Forearm Pronation AROM;10 reps   Wrist Flexion AROM;10 reps   Wrist Extension AROM;10 reps     Additional Elbow Exercises   Hand Gripper with Large Beads all beads gripper at 35#   Hand Gripper with Medium Beads all beads gripper at 35#   Hand Gripper with Small Beads all beads gripper at 35#     Hand Exercises   Sponges Pt picked up soft sponges in hand one at a time to get as many as possible:    Small Pegboard Pt completed small pegboard activity picking up pegs with tweezers and inserting them into board with tweezers. Then pt removed al pegs and put into bucket.     Manual Therapy   Manual Therapy Myofascial release   Manual therapy comments completed separately from therapeutic exercise  Myofascial Release myofascial release to right upper arm and deltoid regions to decrease pain and fascial restrictions and increase joint range of motion.   myofascial release to right elbow region, foearm, to decrease pain and fascial restrictions                 OT Education - Dec 15, 2016 1434    Education provided No          OT Short Term Goals - 2016/12/15 1819      OT SHORT TERM GOAL #1   Title Pt will be educated on HEP to improve ability to use RUE as dominant during B/IADL tasks.    Time 4   Period Weeks     OT SHORT TERM GOAL #2   Title Pt will decrease fascial restrictions from mod to minimal amounts or less to improve mobility required for functional reaching tasks.    Time 4   Period Weeks   Status On-going     OT SHORT TERM GOAL #3   Title Pt will improve pain to 2/10 in RUE to increase ability to use RUE as dominant during daily task completion.    Time 4   Period Weeks     OT SHORT TERM  GOAL #4   Title Pt will improve RUE strength to 4+/5 to improve ability to complete gardening tasks.    Time 4   Period Weeks     OT SHORT TERM GOAL #5   Title Pt will improve right grip strength by 15# and pinch strength by 4# to improve ability to hold pots/pans/plates during meal preparation.    Time 4   Period Weeks   Status On-going     OT SHORT TERM GOAL #6   Title Pt will improve RUE fine motor coordination by completing 9 hole peg test in less than 22" to improve ability to button shirts.    Time 4   Period Weeks                  Plan - 2016/12/15 1758    Clinical Impression Statement A: Pt reports activity tolerance, feeling in first three fingers and her ability to hold objects have all increased throughout her therapy sessions, however she is still having difficulties clipping her bra, buttoning small buttons, and maintaining hold on silverware. Pt completed hand gripper with multiple rest breaks needed due to fatigue of hand as well as increased time needed and activity presented as max difficulty. Completed small pegboard activity with VC needed for form and technique of holding tweezers.   Plan P: Continue with myofascial release to elbow and forearm regions as needed. Focus on pinch strengthening.      Patient will benefit from skilled therapeutic intervention in order to improve the following deficits and impairments:  Increased muscle spasms, Decreased activity tolerance, Decreased strength, Impaired sensation, Decreased range of motion, Pain, Increased edema, Impaired UE functional use, Increased fascial restricitons, Decreased coordination  Visit Diagnosis: Other symptoms and signs involving the musculoskeletal system  Pain in right elbow      G-Codes - 2016-12-15 1819    Functional Assessment Tool Used (Outpatient only) Clinical judgment   Functional Limitation Carrying, moving and handling objects   Carrying, Moving and Handling Objects Current Status  (Z6606) At least 20 percent but less than 40 percent impaired, limited or restricted   Carrying, Moving and Handling Objects Goal Status (T0160) At least 1 percent but less than 20 percent impaired, limited or restricted  Problem List Patient Active Problem List   Diagnosis Date Noted  . Primary localized osteoarthritis of left knee 08/22/2016  . Paresthesia 08/10/2016  . Weakness 08/10/2016  . Depression 07/31/2016  . S/P thyroidectomy 07/31/2016  . Primary osteoarthritis of left knee 07/31/2016  . S/P cervical spinal fusion 07/31/2016  . S/P total knee arthroplasty, right 07/31/2016  . SUBLUXATION, RADIAL HEAD 10/27/2009  . CONTUSION, ELBOW 10/27/2009    Luther Hearing, OT Student [phone] 12/12/2016, 6:24 PM  [place], [place], [address] Phone: [phone]   Fax:  [phone]  Name: [name] MRN: [id number] Date of Birth: Feb 22, 1946     This qualified practitioner was present in the room guiding the student in service delivery. Therapy student was participating in the provision of services, and the practitioner was not engaged in treating another patient or doing other tasks at the same time.  Ailene Ravel, OTR/L,CBIS  [phone]

## 2016-12-14 ENCOUNTER — Ambulatory Visit (HOSPITAL_COMMUNITY): Payer: PPO | Admitting: Occupational Therapy

## 2016-12-14 ENCOUNTER — Encounter (HOSPITAL_COMMUNITY): Payer: Self-pay | Admitting: Occupational Therapy

## 2016-12-14 DIAGNOSIS — R29898 Other symptoms and signs involving the musculoskeletal system: Secondary | ICD-10-CM | POA: Diagnosis not present

## 2016-12-14 DIAGNOSIS — M25521 Pain in right elbow: Secondary | ICD-10-CM

## 2016-12-14 DIAGNOSIS — R278 Other lack of coordination: Secondary | ICD-10-CM

## 2016-12-14 NOTE — Therapy (Signed)
[place], [place], [address] Phone: [phone]   Fax:  [phone]  Occupational Therapy Treatment  Patient Details  Name: [name] MRN: [id number] Date of Birth: 1945-07-10 Referring Provider: Dr. Edmonia Lynch  Encounter Date: 12/14/2016      OT End of Session - 12/14/16 1158    Visit Number 9   Number of Visits 11   Date for OT Re-Evaluation 12/21/16   Authorization Type [employer]   Authorization Time Period Before 18th visit   Authorization - Visit Number 9   Authorization - Number of Visits 18   OT Start Time 1117   OT Stop Time 1200   OT Time Calculation (min) 43 min   Activity Tolerance Patient tolerated treatment well   Behavior During Therapy [facility] for tasks assessed/performed      Past Medical History:  Diagnosis Date  . Arthritis   . Complication of anesthesia    "pt. holds her breath when waking up"  . GERD (gastroesophageal reflux disease)   . Hypertension   . Hypothyroidism   . PONV (postoperative nausea and vomiting)   . Staph infection    abdomen and returned 5 yrs. later    Past Surgical History:  Procedure Laterality Date  . ABDOMINAL HYSTERECTOMY    . CERVICAL FUSION  1990's  . COLON SURGERY    . FOOT SURGERY Left    reshaped  . HERNIA REPAIR    . JOINT REPLACEMENT Right    hip  . LUMBAR SPINE SURGERY    . REPLACEMENT TOTAL KNEE Right   . THYROID SURGERY    . TOTAL KNEE ARTHROPLASTY Left 08/21/2016   Procedure: LEFT TOTAL KNEE ARTHROPLASTY;  Surgeon: Renette Butters, MD;  Location: [place];  Service: Orthopedics;  Laterality: Left;    There were no vitals filed for this visit.      Subjective Assessment - 12/14/16 1117    Subjective  S: A little extra tingling in my pinky today, but at least it's straightening out.    Currently in Pain? Yes   Pain Score 3    Pain Location Elbow   Pain Orientation Right   Pain Descriptors / Indicators  Constant;Sore   Pain Type Acute pain   Pain Radiating Towards elbow   Pain Onset More than a month ago   Pain Frequency Constant   Aggravating Factors  lifting objects   Pain Relieving Factors rest, heat   Effect of Pain on Daily Activities min effect on ADL completion   Multiple Pain Sites No            [facility] OT Assessment - 12/14/16 1116      Assessment   Diagnosis s/p Ulnar nerve impingment sx     Precautions   Precautions None                  OT Treatments/Exercises (OP) - 12/14/16 1119      Exercises   Exercises Elbow;Hand     Additional Elbow Exercises   Sponges Pt used red clothespin and lateral pinch to grasp and move 25 sponges across the table, pt then used 3 point pinch to place 25 sponges into bucket   Theraputty Pinch;Locate Pegs     Hand Exercises   Theraputty Pinch;Locate Pegs   Theraputty - Pinch red   Theraputty - Locate Pegs red-6   Other Hand Exercises Pt used thumb to push 6 beads into red putty, keeping  IP joint slightly flexed to avoid hyperextension. Pt then used tip pinch to remove     Fine Motor Coordination   Fine Motor Coordination Grooved pegs   Grooved pegs Pt completed grooved pegs using tweezers, focusing on sustained 3 point pinch; Mod difficulty with thumb strength during task. Increased time and rest breaks to complete     Manual Therapy   Manual Therapy Myofascial release   Manual therapy comments completed separately from therapeutic exercise   Myofascial Release myofascial release to right upper arm and deltoid regions to decrease pain and fascial restrictions and increase joint range of motion.   myofascial release to right elbow region, foearm, to decrease pain and fascial restrictions                   OT Short Term Goals - 12/12/16 1819      OT SHORT TERM GOAL #1   Title Pt will be educated on HEP to improve ability to use RUE as dominant during B/IADL tasks.    Time 4   Period Weeks     OT SHORT  TERM GOAL #2   Title Pt will decrease fascial restrictions from mod to minimal amounts or less to improve mobility required for functional reaching tasks.    Time 4   Period Weeks   Status On-going     OT SHORT TERM GOAL #3   Title Pt will improve pain to 2/10 in RUE to increase ability to use RUE as dominant during daily task completion.    Time 4   Period Weeks     OT SHORT TERM GOAL #4   Title Pt will improve RUE strength to 4+/5 to improve ability to complete gardening tasks.    Time 4   Period Weeks     OT SHORT TERM GOAL #5   Title Pt will improve right grip strength by 15# and pinch strength by 4# to improve ability to hold pots/pans/plates during meal preparation.    Time 4   Period Weeks   Status On-going     OT SHORT TERM GOAL #6   Title Pt will improve RUE fine motor coordination by completing 9 hole peg test in less than 22" to improve ability to button shirts.    Time 4   Period Weeks                  Plan - 12/14/16 1149    Clinical Impression Statement A: Session focusing on pinch strengthening and sustained use of hand; difficulty with thumb activity tolerance during tweezer task. Verbal cuing for form occasionally during session, rest breaks provided as needed for fatigue.    Plan P: continue working on improving pinch strength and fine motor coordination; sustained use of thumb during tasks   OT Home Exercise Plan 5/31: red theraputty exercises, fine motor coordination    Consulted and Agree with Plan of Care Patient      Patient will benefit from skilled therapeutic intervention in order to improve the following deficits and impairments:  Increased muscle spasms, Decreased activity tolerance, Decreased strength, Impaired sensation, Decreased range of motion, Pain, Increased edema, Impaired UE functional use, Increased fascial restricitons, Decreased coordination  Visit Diagnosis: Other symptoms and signs involving the musculoskeletal system  Pain  in right elbow  Other lack of coordination    Problem List Patient Active Problem List   Diagnosis Date Noted  . Primary localized osteoarthritis of left knee 08/22/2016  . Paresthesia 08/10/2016  .  Weakness 08/10/2016  . Depression 07/31/2016  . S/P thyroidectomy 07/31/2016  . Primary osteoarthritis of left knee 07/31/2016  . S/P cervical spinal fusion 07/31/2016  . S/P total knee arthroplasty, right 07/31/2016  . SUBLUXATION, RADIAL HEAD 10/27/2009  . CONTUSION, ELBOW 10/27/2009   Guadelupe Sabin, OTR/L  [phone] 12/14/2016, 12:00 PM  [place] [address] [place], [place], [address] Phone: [phone]   Fax:  [phone]  Name: ANAYELY CONSTANTINE MRN: [id number] Date of Birth: May 25, 1946

## 2016-12-18 ENCOUNTER — Encounter (HOSPITAL_COMMUNITY): Payer: Self-pay | Admitting: Occupational Therapy

## 2016-12-18 ENCOUNTER — Ambulatory Visit (HOSPITAL_COMMUNITY): Payer: PPO | Admitting: Occupational Therapy

## 2016-12-18 DIAGNOSIS — M25521 Pain in right elbow: Secondary | ICD-10-CM

## 2016-12-18 DIAGNOSIS — R278 Other lack of coordination: Secondary | ICD-10-CM

## 2016-12-18 DIAGNOSIS — R29898 Other symptoms and signs involving the musculoskeletal system: Secondary | ICD-10-CM | POA: Diagnosis not present

## 2016-12-18 NOTE — Therapy (Signed)
[place], [place], [address] Phone: [phone]   Fax:  [phone]  Occupational Therapy Treatment  Patient Details  Name: [name] MRN: [id number] Date of Birth: 1946/02/09 Referring Provider: Dr. Edmonia Lynch  Encounter Date: 12/18/2016      OT End of Session - 12/18/16 1614    Visit Number 10   Number of Visits 11   Date for OT Re-Evaluation 12/21/16   Authorization Type [employer]   Authorization Time Period Before 18th visit   Authorization - Visit Number 10   Authorization - Number of Visits 18   OT Start Time 4496   OT Stop Time 1432   OT Time Calculation (min) 39 min   Activity Tolerance Patient tolerated treatment well   Behavior During Therapy [facility] for tasks assessed/performed      Past Medical History:  Diagnosis Date  . Arthritis   . Complication of anesthesia    "pt. holds her breath when waking up"  . GERD (gastroesophageal reflux disease)   . Hypertension   . Hypothyroidism   . PONV (postoperative nausea and vomiting)   . Staph infection    abdomen and returned 5 yrs. later    Past Surgical History:  Procedure Laterality Date  . ABDOMINAL HYSTERECTOMY    . CERVICAL FUSION  1990's  . COLON SURGERY    . FOOT SURGERY Left    reshaped  . HERNIA REPAIR    . JOINT REPLACEMENT Right    hip  . LUMBAR SPINE SURGERY    . REPLACEMENT TOTAL KNEE Right   . THYROID SURGERY    . TOTAL KNEE ARTHROPLASTY Left 08/21/2016   Procedure: LEFT TOTAL KNEE ARTHROPLASTY;  Surgeon: Renette Butters, MD;  Location: [place];  Service: Orthopedics;  Laterality: Left;    There were no vitals filed for this visit.      Subjective Assessment - 12/18/16 1354    Subjective  S: It feels so good to be able to use this arm.    Currently in Pain? Yes   Pain Score 2    Pain Location Shoulder   Pain Orientation Right   Pain Type Acute pain   Pain Radiating Towards elbow   Pain Onset More  than a month ago   Pain Frequency Constant   Aggravating Factors  lifting objects   Pain Relieving Factors rest, heat   Effect of Pain on Daily Activities min effect on ADL completion   Multiple Pain Sites No            [facility] OT Assessment - 12/18/16 1354      Assessment   Diagnosis s/p Ulnar nerve impingment sx     Precautions   Precautions None                  OT Treatments/Exercises (OP) - 12/18/16 1355      Exercises   Exercises Elbow;Hand     Additional Elbow Exercises   Sponges Pt used green clothespin with lateral pinch to place 11 sponges; pt then switched to red clothespin with 3 point pinch to place remaining 14 sponges   Hand Gripper with Large Beads all beads gripper at 37#  holding gripper sideways for pinch strength   Hand Gripper with Medium Beads all beads gripper at 37#  gripper sideways for pinch strength   Hand Gripper with Small Beads --     Hand Exercises   Other Hand Exercises Pt  placed red, green, blue, and black clothespins on pinch tree using 3 point pinch, pt then removed clothespins using lateral pinch.      Fine Motor Coordination   Manipulating coins Pt held 10 and 14 coins and practiced palm to fingertip translation and dropped in container. Min difficulty, dropped 3 coins on first trial, 2 coins on second trial     Manual Therapy   Manual Therapy Myofascial release   Manual therapy comments completed separately from therapeutic exercise   Myofascial Release myofascial release to right upper arm and deltoid regions to decrease pain and fascial restrictions and increase joint range of motion.   myofascial release to right elbow region, foearm, to decrease pain and fascial restrictions                   OT Short Term Goals - 12/12/16 1819      OT SHORT TERM GOAL #1   Title Pt will be educated on HEP to improve ability to use RUE as dominant during B/IADL tasks.    Time 4   Period Weeks     OT SHORT TERM GOAL #2    Title Pt will decrease fascial restrictions from mod to minimal amounts or less to improve mobility required for functional reaching tasks.    Time 4   Period Weeks   Status On-going     OT SHORT TERM GOAL #3   Title Pt will improve pain to 2/10 in RUE to increase ability to use RUE as dominant during daily task completion.    Time 4   Period Weeks     OT SHORT TERM GOAL #4   Title Pt will improve RUE strength to 4+/5 to improve ability to complete gardening tasks.    Time 4   Period Weeks     OT SHORT TERM GOAL #5   Title Pt will improve right grip strength by 15# and pinch strength by 4# to improve ability to hold pots/pans/plates during meal preparation.    Time 4   Period Weeks   Status On-going     OT SHORT TERM GOAL #6   Title Pt will improve RUE fine motor coordination by completing 9 hole peg test in less than 22" to improve ability to button shirts.    Time 4   Period Weeks                  Plan - 12/18/16 1614    Clinical Impression Statement A: Session with focus on pinch strength and thumb dexterity, as well as sustained use of right hand. Pt with improvements in thumb strength during pinch task compared to previous session. Improvement noted with coin coordination task. Pt requiring decreased rest breaks for fatigue, verbal cuing for form with pinching tasks. Pt reports she is using the right hand during gardening and digging tasks, taking rest breaks as needed.    Plan P: Reassess and determine discharge    OT Home Exercise Plan 5/31: red theraputty exercises, fine motor coordination    Consulted and Agree with Plan of Care Patient      Patient will benefit from skilled therapeutic intervention in order to improve the following deficits and impairments:  Increased muscle spasms, Decreased activity tolerance, Decreased strength, Impaired sensation, Decreased range of motion, Pain, Increased edema, Impaired UE functional use, Increased fascial restricitons,  Decreased coordination  Visit Diagnosis: Other symptoms and signs involving the musculoskeletal system  Pain in right elbow  Other lack of  coordination    Problem List Patient Active Problem List   Diagnosis Date Noted  . Primary localized osteoarthritis of left knee 08/22/2016  . Paresthesia 08/10/2016  . Weakness 08/10/2016  . Depression 07/31/2016  . S/P thyroidectomy 07/31/2016  . Primary osteoarthritis of left knee 07/31/2016  . S/P cervical spinal fusion 07/31/2016  . S/P total knee arthroplasty, right 07/31/2016  . SUBLUXATION, RADIAL HEAD 10/27/2009  . CONTUSION, ELBOW 10/27/2009   Guadelupe Sabin, OTR/L  [phone] 12/18/2016, 4:17 PM  [place] [address] [place], [place], [address] Phone: [phone]   Fax:  [phone]  Name: [name] MRN: [id number] Date of Birth: February 08, 1946

## 2016-12-19 DIAGNOSIS — G5621 Lesion of ulnar nerve, right upper limb: Secondary | ICD-10-CM | POA: Diagnosis not present

## 2016-12-20 ENCOUNTER — Ambulatory Visit (HOSPITAL_COMMUNITY): Payer: PPO | Admitting: Occupational Therapy

## 2016-12-20 ENCOUNTER — Encounter (HOSPITAL_COMMUNITY): Payer: Self-pay | Admitting: Occupational Therapy

## 2016-12-20 DIAGNOSIS — M25521 Pain in right elbow: Secondary | ICD-10-CM

## 2016-12-20 DIAGNOSIS — R29898 Other symptoms and signs involving the musculoskeletal system: Secondary | ICD-10-CM

## 2016-12-20 DIAGNOSIS — R278 Other lack of coordination: Secondary | ICD-10-CM

## 2016-12-20 NOTE — Therapy (Signed)
[place], [place], [address] Phone: [phone]   Fax:  [phone]  Occupational Therapy Reassessment, Treatment, and Discharge Summary  Patient Details  Name: [name] MRN: [id number] Date of Birth: [birth year] Referring Provider: Dr. Edmonia Lynch  Encounter Date: 12/20/2016      OT End of Session - 12/20/16 1534    Visit Number 11   Number of Visits 11   Date for OT Re-Evaluation 12/21/16   Authorization Type [employer]   Authorization Time Period Before 18th visit   Authorization - Visit Number 11   Authorization - Number of Visits 18   OT Start Time 1301   OT Stop Time 1345   OT Time Calculation (min) 44 min   Activity Tolerance Patient tolerated treatment well   Behavior During Therapy [facility] for tasks assessed/performed      Past Medical History:  Diagnosis Date  . Arthritis   . Complication of anesthesia    "pt. holds her breath when waking up"  . GERD (gastroesophageal reflux disease)   . Hypertension   . Hypothyroidism   . PONV (postoperative nausea and vomiting)   . Staph infection    abdomen and returned 5 yrs. later    Past Surgical History:  Procedure Laterality Date  . ABDOMINAL HYSTERECTOMY    . CERVICAL FUSION  1990's  . COLON SURGERY    . FOOT SURGERY Left    reshaped  . HERNIA REPAIR    . JOINT REPLACEMENT Right    hip  . LUMBAR SPINE SURGERY    . REPLACEMENT TOTAL KNEE Right   . THYROID SURGERY    . TOTAL KNEE ARTHROPLASTY Left 08/21/2016   Procedure: LEFT TOTAL KNEE ARTHROPLASTY;  Surgeon: Renette Butters, MD;  Location: [facility];  Service: Orthopedics;  Laterality: Left;    There were no vitals filed for this visit.      Subjective Assessment - 12/20/16 1325    Subjective  S: The doctor was really pleased with everything.    Currently in Pain? No/denies            [facility] OT Assessment - 12/20/16 1259      Assessment   Diagnosis s/p Ulnar  nerve impingment sx     Precautions   Precautions None     Coordination   Right 9 Hole Peg Test 20.36"  20.92" previous     Strength   Right Wrist Flexion 5/5  4+/5 previous   Right Hand Grip (lbs) 50  same as previous   Right Hand Lateral Pinch 6 lbs  same as previous   Right Hand 3 Point Pinch 6 lbs  5 previous                  OT Treatments/Exercises (OP) - 12/20/16 1321      Exercises   Exercises Hand     Additional Elbow Exercises   Theraputty Flatten;Pinch   Theraputty - Flatten red in standing   Hand Gripper with Large Beads all beads gripper at 37#  gripper sideways for pinch strengthening   Hand Gripper with Medium Beads all beads gripper at 37#  gripper sideways for pinch strengthening   Hand Gripper with Small Beads all beads gripper 37#  gripper sideways for pinch strengthening      Hand Exercises   Other Hand Exercises Pt used pvc pipe to cut circles into red putty with right hand. Min difficulty, min fatigue at end  of task                OT Education - 01-07-2017 1539    Education provided Yes   Education Details reviewed HEP, educated on grip and pinch strengthening and importance of usign right hand as dominant to build strength   Person(s) Educated Patient   Methods Explanation   Comprehension Verbalized understanding          OT Short Term Goals - 01/07/17 1319      OT SHORT TERM GOAL #1   Title Pt will be educated on HEP to improve ability to use RUE as dominant during B/IADL tasks.    Time 4   Period Weeks     OT SHORT TERM GOAL #2   Title Pt will decrease fascial restrictions from mod to minimal amounts or less to improve mobility required for functional reaching tasks.    Time 4   Period Weeks   Status Achieved     OT SHORT TERM GOAL #3   Title Pt will improve pain to 2/10 in RUE to increase ability to use RUE as dominant during daily task completion.    Time 4   Period Weeks     OT SHORT TERM GOAL #4   Title  Pt will improve RUE strength to 4+/5 to improve ability to complete gardening tasks.    Time 4   Period Weeks     OT SHORT TERM GOAL #5   Title Pt will improve right grip strength by 15# and pinch strength by 4# to improve ability to hold pots/pans/plates during meal preparation.    Time 4   Period Weeks   Status Not Met     OT SHORT TERM GOAL #6   Title Pt will improve RUE fine motor coordination by completing 9 hole peg test in less than 22" to improve ability to button shirts.    Time 4   Period Weeks                  Plan - January 07, 2017 1534    Clinical Impression Statement A: Reassessment completed this session, pt has met all goals with exception of grip and pinch strength goal, although pt has made improvements in grip strengthening. Pt continues to have difficulty with pinch strengthening due to muscle atrophy around the intrinsic muscles of the thumb and index finger. Pt is using her RUE as dominant with B/IADL tasks, taking rest breaks as needed, and is agreeable to discharge with HEP.    Plan P: discharge pt   OT Home Exercise Plan 5/31: red theraputty exercises, fine motor coordination    Consulted and Agree with Plan of Care Patient      Patient will benefit from skilled therapeutic intervention in order to improve the following deficits and impairments:  Increased muscle spasms, Decreased activity tolerance, Decreased strength, Impaired sensation, Decreased range of motion, Pain, Increased edema, Impaired UE functional use, Increased fascial restricitons, Decreased coordination  Visit Diagnosis: Other symptoms and signs involving the musculoskeletal system  Pain in right elbow  Other lack of coordination      G-Codes - 07-Jan-2017 1326    Functional Assessment Tool Used (Outpatient only) Clinical judgment   Functional Limitation Carrying, moving and handling objects   Carrying, Moving and Handling Objects Goal Status (H8469) At least 1 percent but less than 20  percent impaired, limited or restricted   Carrying, Moving and Handling Objects Discharge Status (G2952) At least 1 percent but less  than 20 percent impaired, limited or restricted      Problem List Patient Active Problem List   Diagnosis Date Noted  . Primary localized osteoarthritis of left knee 08/22/2016  . Paresthesia 08/10/2016  . Weakness 08/10/2016  . Depression 07/31/2016  . S/P thyroidectomy 07/31/2016  . Primary osteoarthritis of left knee 07/31/2016  . S/P cervical spinal fusion 07/31/2016  . S/P total knee arthroplasty, right 07/31/2016  . SUBLUXATION, RADIAL HEAD 10/27/2009  . CONTUSION, ELBOW 10/27/2009   Guadelupe Sabin, OTR/L  [phone] 12/20/2016, 3:40 PM  [place], [place], [address] Phone: [phone]   Fax:  [phone]  Name: [name] MRN: [id number] Date of Birth: 16-May-1946    OCCUPATIONAL THERAPY DISCHARGE SUMMARY  Visits from Start of Care: 11  Current functional level related to goals / functional outcomes: See above. Pt is using right hand as dominant during all B/IADL tasks.    Remaining deficits: Pt continues to have grip and pinch strength deficits, however strength is functional.    Education / Equipment: Educated on continued use of right hand for strengthening during B/IADL tasks  Plan: Patient agrees to discharge.  Patient goals were met. Patient is being discharged due to meeting the stated rehab goals.  ?????

## 2016-12-25 DIAGNOSIS — G8918 Other acute postprocedural pain: Secondary | ICD-10-CM | POA: Diagnosis not present

## 2016-12-25 DIAGNOSIS — M2022 Hallux rigidus, left foot: Secondary | ICD-10-CM | POA: Diagnosis not present

## 2017-02-11 DIAGNOSIS — Z4789 Encounter for other orthopedic aftercare: Secondary | ICD-10-CM | POA: Diagnosis not present

## 2017-02-11 DIAGNOSIS — M2022 Hallux rigidus, left foot: Secondary | ICD-10-CM | POA: Diagnosis not present

## 2017-03-05 ENCOUNTER — Encounter (HOSPITAL_COMMUNITY): Payer: Self-pay | Admitting: Cardiology

## 2017-03-05 ENCOUNTER — Emergency Department (HOSPITAL_COMMUNITY): Payer: PPO

## 2017-03-05 ENCOUNTER — Observation Stay (HOSPITAL_COMMUNITY)
Admission: EM | Admit: 2017-03-05 | Discharge: 2017-03-06 | Disposition: A | Discharge status: Home / Self Care | Payer: PPO | Attending: Internal Medicine | Admitting: Internal Medicine

## 2017-03-05 DIAGNOSIS — E871 Hypo-osmolality and hyponatremia: Secondary | ICD-10-CM | POA: Insufficient documentation

## 2017-03-05 DIAGNOSIS — G459 Transient cerebral ischemic attack, unspecified: Secondary | ICD-10-CM | POA: Diagnosis not present

## 2017-03-05 DIAGNOSIS — E876 Hypokalemia: Secondary | ICD-10-CM | POA: Insufficient documentation

## 2017-03-05 DIAGNOSIS — M1990 Unspecified osteoarthritis, unspecified site: Secondary | ICD-10-CM

## 2017-03-05 DIAGNOSIS — M1712 Unilateral primary osteoarthritis, left knee: Secondary | ICD-10-CM | POA: Diagnosis present

## 2017-03-05 DIAGNOSIS — E039 Hypothyroidism, unspecified: Secondary | ICD-10-CM | POA: Insufficient documentation

## 2017-03-05 DIAGNOSIS — F329 Major depressive disorder, single episode, unspecified: Secondary | ICD-10-CM | POA: Insufficient documentation

## 2017-03-05 DIAGNOSIS — F32A Depression, unspecified: Secondary | ICD-10-CM | POA: Diagnosis present

## 2017-03-05 DIAGNOSIS — K219 Gastro-esophageal reflux disease without esophagitis: Secondary | ICD-10-CM | POA: Diagnosis not present

## 2017-03-05 DIAGNOSIS — Z79899 Other long term (current) drug therapy: Secondary | ICD-10-CM | POA: Diagnosis not present

## 2017-03-05 DIAGNOSIS — Z96641 Presence of right artificial hip joint: Secondary | ICD-10-CM | POA: Insufficient documentation

## 2017-03-05 DIAGNOSIS — Z96651 Presence of right artificial knee joint: Secondary | ICD-10-CM | POA: Diagnosis not present

## 2017-03-05 DIAGNOSIS — R29818 Other symptoms and signs involving the nervous system: Secondary | ICD-10-CM | POA: Diagnosis not present

## 2017-03-05 DIAGNOSIS — Z96652 Presence of left artificial knee joint: Secondary | ICD-10-CM | POA: Insufficient documentation

## 2017-03-05 DIAGNOSIS — R4781 Slurred speech: Secondary | ICD-10-CM | POA: Diagnosis present

## 2017-03-05 DIAGNOSIS — I10 Essential (primary) hypertension: Secondary | ICD-10-CM | POA: Insufficient documentation

## 2017-03-05 LAB — CBC
HCT: 38.1 % (ref 36.0–46.0)
HEMOGLOBIN: 13.4 g/dL (ref 12.0–15.0)
MCH: 30.7 pg (ref 26.0–34.0)
MCHC: 35.2 g/dL (ref 30.0–36.0)
MCV: 87.4 fL (ref 78.0–100.0)
Platelets: 224 10*3/uL (ref 150–400)
RBC: 4.36 MIL/uL (ref 3.87–5.11)
RDW: 12.2 % (ref 11.5–15.5)
WBC: 5.8 10*3/uL (ref 4.0–10.5)

## 2017-03-05 LAB — COMPREHENSIVE METABOLIC PANEL
ALBUMIN: 4.4 g/dL (ref 3.5–5.0)
ALK PHOS: 88 U/L (ref 38–126)
ALT: 15 U/L (ref 14–54)
ANION GAP: 11 (ref 5–15)
AST: 25 U/L (ref 15–41)
BILIRUBIN TOTAL: 0.6 mg/dL (ref 0.3–1.2)
BUN: 11 mg/dL (ref 6–20)
CALCIUM: 9.3 mg/dL (ref 8.9–10.3)
CO2: 24 mmol/L (ref 22–32)
CREATININE: 0.82 mg/dL (ref 0.44–1.00)
Chloride: 93 mmol/L — ABNORMAL LOW (ref 101–111)
GFR calc Af Amer: >60 mL/min (ref >60)
GFR calc non Af Amer: >60 mL/min (ref >60)
GLUCOSE: 91 mg/dL (ref 65–99)
Potassium: 3.3 mmol/L — ABNORMAL LOW (ref 3.5–5.1)
Sodium: 128 mmol/L — ABNORMAL LOW (ref 135–145)
TOTAL PROTEIN: 6.9 g/dL (ref 6.5–8.1)

## 2017-03-05 LAB — RAPID URINE DRUG SCREEN, HOSP PERFORMED
Amphetamines: NOT DETECTED
BARBITURATES: NOT DETECTED
Benzodiazepines: NOT DETECTED
Cocaine: NOT DETECTED
OPIATES: NOT DETECTED
TETRAHYDROCANNABINOL: NOT DETECTED

## 2017-03-05 LAB — URINALYSIS, ROUTINE W REFLEX MICROSCOPIC
Bilirubin Urine: NEGATIVE
GLUCOSE, UA: NEGATIVE mg/dL
Ketones, ur: 5 mg/dL — AB
NITRITE: NEGATIVE
PH: 6 (ref 5.0–8.0)
Protein, ur: NEGATIVE mg/dL
SPECIFIC GRAVITY, URINE: 1.012 (ref 1.005–1.030)

## 2017-03-05 LAB — I-STAT CHEM 8, ED
BUN: 14 mg/dL (ref 6–20)
CALCIUM ION: 1.03 mmol/L — AB (ref 1.15–1.40)
CREATININE: 0.7 mg/dL (ref 0.44–1.00)
Chloride: 94 mmol/L — ABNORMAL LOW (ref 101–111)
GLUCOSE: 96 mg/dL (ref 65–99)
HCT: 38 % (ref 36.0–46.0)
Hemoglobin: 12.9 g/dL (ref 12.0–15.0)
Potassium: 7.5 mmol/L (ref 3.5–5.1)
Sodium: 126 mmol/L — ABNORMAL LOW (ref 135–145)
TCO2: 29 mmol/L (ref 22–32)

## 2017-03-05 LAB — DIFFERENTIAL
Basophils Absolute: 0 10*3/uL (ref 0.0–0.1)
Basophils Relative: 1 %
EOS PCT: 5 %
Eosinophils Absolute: 0.3 10*3/uL (ref 0.0–0.7)
LYMPHS ABS: 1.8 10*3/uL (ref 0.7–4.0)
LYMPHS PCT: 31 %
MONO ABS: 0.6 10*3/uL (ref 0.1–1.0)
Monocytes Relative: 10 %
NEUTROS PCT: 53 %
Neutro Abs: 3.1 10*3/uL (ref 1.7–7.7)

## 2017-03-05 LAB — I-STAT TROPONIN, ED: Troponin i, poc: 0.01 ng/mL (ref 0.00–0.08)

## 2017-03-05 LAB — ETHANOL: Alcohol, Ethyl (B): <10 mg/dL (ref <10)

## 2017-03-05 LAB — PROTIME-INR
INR: 1.04
Prothrombin Time: 13.5 seconds (ref 11.4–15.2)

## 2017-03-05 LAB — LIPID PANEL
CHOL/HDL RATIO: 3.4 ratio
CHOLESTEROL: 238 mg/dL — AB (ref 0–200)
HDL: 70 mg/dL (ref >40)
LDL Cholesterol: 159 mg/dL — ABNORMAL HIGH (ref 0–99)
TRIGLYCERIDES: 44 mg/dL (ref <150)
VLDL: 9 mg/dL (ref 0–40)

## 2017-03-05 LAB — TROPONIN I

## 2017-03-05 LAB — CBG MONITORING, ED: Glucose-Capillary: 92 mg/dL (ref 65–99)

## 2017-03-05 LAB — TSH: TSH: 1.038 u[IU]/mL (ref 0.350–4.500)

## 2017-03-05 LAB — APTT: aPTT: 40 seconds — ABNORMAL HIGH (ref 24–36)

## 2017-03-05 MED ORDER — ONDANSETRON HCL 4 MG PO TABS: 4.0000 mg | ORAL_TABLET | Freq: Three times a day (TID) | ORAL | Status: DC | PRN

## 2017-03-05 MED ORDER — DICLOFENAC SODIUM 75 MG PO TBEC
75.0000 mg | DELAYED_RELEASE_TABLET | Freq: Two times a day (BID) | ORAL | Status: DC
  Administered 2017-03-05 – 2017-03-06 (×2): 75 mg via ORAL
  Filled 2017-03-05 (×7): qty 1

## 2017-03-05 MED ORDER — ASPIRIN EC 325 MG PO TBEC
325.0000 mg | DELAYED_RELEASE_TABLET | Freq: Every day | ORAL | Status: DC
Start: 2017-03-05 — End: 2017-03-05

## 2017-03-05 MED ORDER — DOCUSATE SODIUM 100 MG PO CAPS
100.0000 mg | ORAL_CAPSULE | Freq: Two times a day (BID) | ORAL | Status: DC
  Administered 2017-03-05: 100 mg via ORAL
  Filled 2017-03-05 (×2): qty 1

## 2017-03-05 MED ORDER — METOPROLOL TARTRATE 5 MG/5ML IV SOLN: 5.0000 mg | INTRAVENOUS | Status: DC | PRN

## 2017-03-05 MED ORDER — TURMERIC 500 MG PO CAPS: 1000.0000 mg | ORAL_CAPSULE | Freq: Two times a day (BID) | ORAL | Status: DC

## 2017-03-05 MED ORDER — BIOTIN 2500 MCG PO CAPS: 5000.0000 ug | ORAL_CAPSULE | Freq: Two times a day (BID) | ORAL | Status: DC

## 2017-03-05 MED ORDER — HYDRALAZINE HCL 20 MG/ML IJ SOLN: 10.0000 mg | INTRAMUSCULAR | Status: DC | PRN

## 2017-03-05 MED ORDER — DULOXETINE HCL 30 MG PO CPEP
30.0000 mg | ORAL_CAPSULE | Freq: Every day | ORAL | Status: DC
  Administered 2017-03-06: 30 mg via ORAL
  Filled 2017-03-05: qty 1

## 2017-03-05 MED ORDER — CALCIUM CARBONATE-VITAMIN D 500-200 MG-UNIT PO TABS
1.0000 | ORAL_TABLET | Freq: Every day | ORAL | Status: DC
  Administered 2017-03-06: 09:00:00 1 via ORAL
  Filled 2017-03-05: qty 1

## 2017-03-05 MED ORDER — LEVOTHYROXINE SODIUM 75 MCG PO TABS
75.0000 ug | ORAL_TABLET | Freq: Every day | ORAL | Status: DC
  Administered 2017-03-06: 75 ug via ORAL
  Filled 2017-03-05: qty 1

## 2017-03-05 MED ORDER — ASPIRIN 325 MG PO TABS: 650.0000 mg | ORAL_TABLET | Freq: Three times a day (TID) | ORAL | Status: DC | PRN

## 2017-03-05 MED ORDER — SODIUM CHLORIDE 0.9 % IV SOLN
INTRAVENOUS | Status: DC
  Administered 2017-03-05 – 2017-03-06 (×2): via INTRAVENOUS

## 2017-03-05 MED ORDER — ASPIRIN-CAFFEINE 500-32.5 MG PO TABS: 2.0000 | ORAL_TABLET | Freq: Three times a day (TID) | ORAL | Status: DC | PRN

## 2017-03-05 MED ORDER — POTASSIUM CHLORIDE CRYS ER 20 MEQ PO TBCR
40.0000 meq | EXTENDED_RELEASE_TABLET | Freq: Once | ORAL | Status: AC
  Administered 2017-03-05: 40 meq via ORAL
  Filled 2017-03-05: qty 2

## 2017-03-05 MED ORDER — HYDROXYCHLOROQUINE SULFATE 200 MG PO TABS
200.0000 mg | ORAL_TABLET | Freq: Two times a day (BID) | ORAL | Status: DC
  Administered 2017-03-05 – 2017-03-06 (×2): 200 mg via ORAL
  Filled 2017-03-05 (×7): qty 1

## 2017-03-05 MED ORDER — OXYCODONE-ACETAMINOPHEN 5-325 MG PO TABS: 1.0000 | ORAL_TABLET | ORAL | Status: DC | PRN

## 2017-03-05 MED ORDER — MILK THISTLE 175 MG PO TABS
350.0000 mg | ORAL_TABLET | Freq: Every day | ORAL | Status: DC
Start: 2017-03-05 — End: 2017-03-05

## 2017-03-05 MED ORDER — ASPIRIN 325 MG PO TABS
325.0000 mg | ORAL_TABLET | Freq: Once | ORAL | Status: AC
  Administered 2017-03-05: 325 mg via ORAL
  Filled 2017-03-05: qty 1

## 2017-03-05 MED ORDER — BACLOFEN 10 MG PO TABS: 10.0000 mg | ORAL_TABLET | Freq: Three times a day (TID) | ORAL | Status: DC | PRN

## 2017-03-05 MED ORDER — PANTOPRAZOLE SODIUM 40 MG PO TBEC
40.0000 mg | DELAYED_RELEASE_TABLET | Freq: Every day | ORAL | Status: DC
  Administered 2017-03-06: 40 mg via ORAL
  Filled 2017-03-05: qty 1

## 2017-03-05 NOTE — ED Notes (Signed)
Pt states she feels much better at this time. Ambulatory to restroom and back with steady and even gait.

## 2017-03-05 NOTE — ED Triage Notes (Signed)
Per [place] daughter pt was LKW one hour ago.   States she started slurring her words and getting her words mixed up.

## 2017-03-05 NOTE — ED Provider Notes (Signed)
[facility] DEPT Provider Note   CSN: [id number] Arrival date & time: 03/05/17  1725   Level V caveat acuity of situation and history is obtained from patient's granddaughter andfrom patient  History   Chief Complaint No chief complaint on file.  Patient developed sudden onset forgetfulness of words and occasional slurring of words onset 1 hour ago. Her granddaughter states that she forgot who the president was. Patient denies any specific complaint except for "I feel strange" no treatment prior to coming here. No difficulty with gait. No pain anywhere. HPI [name] is a 71 y.o. female.  HPI  Past Medical History:  Diagnosis Date  . Arthritis   . Complication of anesthesia    "pt. holds her breath when waking up"  . GERD (gastroesophageal reflux disease)   . Hypertension   . Hypothyroidism   . PONV (postoperative nausea and vomiting)   . Staph infection    abdomen and returned 5 yrs. later    Patient Active Problem List   Diagnosis Date Noted  . Primary localized osteoarthritis of left knee 08/22/2016  . Paresthesia 08/10/2016  . Weakness 08/10/2016  . Depression 07/31/2016  . S/P thyroidectomy 07/31/2016  . Primary osteoarthritis of left knee 07/31/2016  . S/P cervical spinal fusion 07/31/2016  . S/P total knee arthroplasty, right 07/31/2016  . SUBLUXATION, RADIAL HEAD 10/27/2009  . CONTUSION, ELBOW 10/27/2009    Past Surgical History:  Procedure Laterality Date  . ABDOMINAL HYSTERECTOMY    . CERVICAL FUSION  1990's  . COLON SURGERY    . FOOT SURGERY Left    reshaped  . HERNIA REPAIR    . JOINT REPLACEMENT Right    hip  . LUMBAR SPINE SURGERY    . REPLACEMENT TOTAL KNEE Right   . THYROID SURGERY    . TOTAL KNEE ARTHROPLASTY Left 08/21/2016   Procedure: LEFT TOTAL KNEE ARTHROPLASTY;  Surgeon: Renette Butters, MD;  Location: [facility];  Service: Orthopedics;  Laterality: Left;    OB History    No data available       Home Medications     Prior to Admission medications   Medication Sig Start Date End Date Taking? Authorizing Provider  aspirin EC 325 MG tablet Take 1 tablet (325 mg total) by mouth daily. For 30 days post op for DVT Prophylaxis 08/21/16   Prudencio Burly III, PA-C  Aspirin-Caffeine (BAYER BACK & BODY) 500-32.5 MG TABS Take 2 tablets by mouth 3 (three) times daily as needed (for pain.).     [provider]  baclofen (LIORESAL) 10 MG tablet Take 1 tablet (10 mg total) by mouth 3 (three) times daily as needed for muscle spasms. 08/21/16   Prudencio Burly III, PA-C  Biotin 2500 MCG CAPS Take 5,000 mcg by mouth 2 (two) times daily.    [provider]  bisoprolol-hydrochlorothiazide (ZIAC) 2.5-6.25 MG tablet Take 1 tablet by mouth at bedtime. 06/21/16   [provider]  Calcium Carbonate-Vit D-Min (CALCIUM 600+D PLUS MINERALS) 600-400 MG-UNIT CHEW Chew 1 tablet by mouth daily.    [provider]  diclofenac (VOLTAREN) 75 MG EC tablet Take 75 mg by mouth 2 (two) times daily. 06/28/16   [provider]  docusate sodium (COLACE) 100 MG capsule Take 1 capsule (100 mg total) by mouth 2 (two) times daily. To prevent constipation while taking pain medication. 08/21/16   Prudencio Burly III, PA-C  DULoxetine (CYMBALTA) 30 MG capsule Take 30 mg by mouth daily. 06/28/16  [provider]  hydroxychloroquine (PLAQUENIL) 200 MG tablet Take 200 mg by mouth 2 (two) times daily.    [provider]  levothyroxine (SYNTHROID, LEVOTHROID) 75 MCG tablet Take 75 mcg by mouth daily before breakfast. 06/01/16   [provider]  milk thistle 175 MG tablet Take 350 mg by mouth daily.    [provider]  omeprazole (PRILOSEC) 20 MG capsule Take 1 capsule (20 mg total) by mouth daily. While taking anti inflammatory medicine daily 08/21/16   Prudencio Burly III, PA-C  ondansetron (ZOFRAN) 4 MG tablet Take 1 tablet (4 mg total) by mouth every 8  (eight) hours as needed for nausea or vomiting. 08/21/16   Prudencio Burly III, PA-C  oxyCODONE-acetaminophen (ROXICET) 5-325 MG tablet Take 1-2 tablets by mouth every 4 (four) hours as needed for severe pain. 08/21/16   Prudencio Burly III, PA-C  Turmeric 500 MG CAPS Take 1,000 mg by mouth 2 (two) times daily.    [provider]    Family History No family history on file.  Social History Social History  Substance Use Topics  . Smoking status: Never Smoker  . Smokeless tobacco: Never Used  . Alcohol use No     Allergies   Sulfamethoxazole and Sulfonamide derivatives   Review of Systems Review of Systems  Unable to perform ROS: Acuity of condition  Neurological: Positive for speech difficulty.     Physical Exam Updated Vital Signs There were no vitals taken for this visit.  Physical Exam  Constitutional: She appears well-developed and well-nourished.  HENT:  Head: Normocephalic and atraumatic.  Eyes: Pupils are equal, round, and reactive to light. Conjunctivae are normal.  Neck: Neck supple. No tracheal deviation present. No thyromegaly present.  Cardiovascular: Regular rhythm.   No murmur heard. Mildly tachycardic  Pulmonary/Chest: Effort normal and breath sounds normal.  Abdominal: Soft. Bowel sounds are normal. She exhibits no distension. There is no tenderness.  Musculoskeletal: Normal range of motion. She exhibits no edema or tenderness.  Neurological: She is alert. Coordination normal.  Gait normal cranial nerves II through XII grossly intact. Romberg normal pronator drift normal  Skin: Skin is warm and dry. No rash noted.  Psychiatric: She has a normal mood and affect.  Nursing note and vitals reviewed.    ED Treatments / Results  Labs (all labs ordered are listed, but only abnormal results are displayed) Labs Reviewed  ETHANOL  PROTIME-INR  APTT  CBC  DIFFERENTIAL  COMPREHENSIVE METABOLIC PANEL  RAPID URINE DRUG SCREEN, HOSP  PERFORMED  URINALYSIS, ROUTINE W REFLEX MICROSCOPIC  CBG MONITORING, ED  I-STAT CHEM 8, ED  I-STAT TROPONIN, ED    EKG  EKG Interpretation None       Radiology No results found.  Procedures Procedures (including critical care time)  Medications Ordered in ED Medications - No data to display Results for orders placed or performed during the hospital encounter of 03/05/17  Ethanol  Result Value Ref Range   Alcohol, Ethyl (B) <10 <10 mg/dL  Protime-INR  Result Value Ref Range   Prothrombin Time 13.5 11.4 - 15.2 seconds   INR 1.04   APTT  Result Value Ref Range   aPTT 40 (H) 24 - 36 seconds  CBC  Result Value Ref Range   WBC 5.8 4.0 - 10.5 K/uL   RBC 4.36 3.87 - 5.11 MIL/uL   Hemoglobin 13.4 12.0 - 15.0 g/dL   HCT 38.1 36.0 - 46.0 %   MCV 87.4 78.0 -  100.0 fL   MCH 30.7 26.0 - 34.0 pg   MCHC 35.2 30.0 - 36.0 g/dL   RDW 12.2 11.5 - 15.5 %   Platelets 224 150 - 400 K/uL  Differential  Result Value Ref Range   Neutrophils Relative % 53 %   Neutro Abs 3.1 1.7 - 7.7 K/uL   Lymphocytes Relative 31 %   Lymphs Abs 1.8 0.7 - 4.0 K/uL   Monocytes Relative 10 %   Monocytes Absolute 0.6 0.1 - 1.0 K/uL   Eosinophils Relative 5 %   Eosinophils Absolute 0.3 0.0 - 0.7 K/uL   Basophils Relative 1 %   Basophils Absolute 0.0 0.0 - 0.1 K/uL  Comprehensive metabolic panel  Result Value Ref Range   Sodium 128 (L) 135 - 145 mmol/L   Potassium 3.3 (L) 3.5 - 5.1 mmol/L   Chloride 93 (L) 101 - 111 mmol/L   CO2 24 22 - 32 mmol/L   Glucose, Bld 91 65 - 99 mg/dL   BUN 11 6 - 20 mg/dL   Creatinine, Ser 0.82 0.44 - 1.00 mg/dL   Calcium 9.3 8.9 - 10.3 mg/dL   Total Protein 6.9 6.5 - 8.1 g/dL   Albumin 4.4 3.5 - 5.0 g/dL   AST 25 15 - 41 U/L   ALT 15 14 - 54 U/L   Alkaline Phosphatase 88 38 - 126 U/L   Total Bilirubin 0.6 0.3 - 1.2 mg/dL   GFR calc non Af Amer >60 >60 mL/min   GFR calc Af Amer >60 >60 mL/min   Anion gap 11 5 - 15  CBG monitoring, ED  Result Value Ref Range    Glucose-Capillary 92 65 - 99 mg/dL  I-Stat Chem 8, ED  Result Value Ref Range   Sodium 126 (L) 135 - 145 mmol/L   Potassium 7.5 (HH) 3.5 - 5.1 mmol/L   Chloride 94 (L) 101 - 111 mmol/L   BUN 14 6 - 20 mg/dL   Creatinine, Ser 0.70 0.44 - 1.00 mg/dL   Glucose, Bld 96 65 - 99 mg/dL   Calcium, Ion 1.03 (L) 1.15 - 1.40 mmol/L   TCO2 29 22 - 32 mmol/L   Hemoglobin 12.9 12.0 - 15.0 g/dL   HCT 38.0 36.0 - 46.0 %   Comment NOTIFIED PHYSICIAN   I-stat troponin, ED  Result Value Ref Range   Troponin i, poc 0.01 0.00 - 0.08 ng/mL   Comment 3           Ct Head Code Stroke Wo Contrast  Result Date: 03/05/2017 CLINICAL DATA:  Code stroke. Initial evaluation for acute speech difficulty. EXAM: CT HEAD WITHOUT CONTRAST TECHNIQUE: Contiguous axial images were obtained from the base of the skull through the vertex without intravenous contrast. COMPARISON:  Prior CT from 08/31/2009. FINDINGS: Brain: Progressive cerebral atrophy with chronic microvascular ischemic disease. No acute intracranial hemorrhage. No evidence for acute large vessel territory infarct. No mass lesion, midline shift or mass effect. No hydrocephalus. No extra-axial fluid collection. Vascular: No asymmetric hyperdense vessel. Scattered vascular calcifications noted within the carotid siphons. Skull: Scalp soft tissues and calvarium within normal limits. Sinuses/Orbits: Globes and orbital soft tissues normal. Paranasal sinuses and mastoid air cells are clear. Other: None. ASPECTS [facility] Stroke Program Early CT Score) - Ganglionic level infarction (caudate, lentiform nuclei, internal capsule, insula, M1-M3 cortex): 7 - Supraganglionic infarction (M4-M6 cortex): 3 Total score (0-10 with 10 being normal): 10 IMPRESSION: 1. No acute intracranial infarct or other process identified. 2. ASPECTS is 10.  3. Generalized age-related cerebral atrophy with chronic microvascular ischemic disease, progressed relative to most recent CT from 2011. Critical  Value/emergent results were called by telephone at the time of interpretation on 03/05/2017 at 5:57 pm to Dr. Orlie Dakin , who verbally acknowledged these results. Electronically Signed   By: Jeannine Boga M.D.   On: 03/05/2017 17:58    Initial Impression / Assessment and Plan / ED Course  I have reviewed the triage vital signs and the nursing notes.  Pertinent labs & imaging results that were available during my care of the patient were reviewed by me and considered in my medical decision making (see chart for details). Code stroke called by me upon patient's arrival    6:15 PM patient is alert Glasgow Coma Score 15 asymptomatic and feels much improved and at baseline She is alert oriented 3 moves all extremity as well. NIH stroke scale equals 0. Tel neurologist evaluate patient and feels that she should be observed overnight and have workup for transient ischemic attack. code stroke canceled by [facility] consulted Dr. Meda Coffee mean from hospital service who will arrange for 23 hour observation oral potassium and oral aspirin orderedby me. Final Clinical Impressions(s) / ED Diagnoses  Diagnosis#1 transient ischemic attack #2 hypokalemia #3hyponatremia #4 elevated blood pressure Final diagnoses:  None    New Prescriptions New Prescriptions   No medications on file     Orlie Dakin, MD 03/05/17 [phone]

## 2017-03-05 NOTE — ED Notes (Signed)
Potassium was critical on istat chem 8, Dr. Winfred Leeds notified and bmet was done in lab.

## 2017-03-05 NOTE — Progress Notes (Signed)
CALL FROM RN [place] [facility] [phone]

## 2017-03-05 NOTE — ED Notes (Signed)
Report given to [facility] 300.

## 2017-03-05 NOTE — H&P (Signed)
History and Physical    [name] TOI:[id number] DOB: 1945-08-08 DOA: 03/05/2017  PCP: [name], MD Patient coming from: Home  Chief Complaint: Difficulty speaking  HPI: [name] is a 71 y.o. female with medical history significant of arthritis, hypertension, hypothyroidism came to the hospital for evaluation of slurred speech and expressive aphasia. Patient states last Friday when she was at the car dealership buying a car all of a sudden she was unable to see her personal information including her name, address and bur day. Even though she knew what it was she was unable to say it. As she was running and down the piece of paper, it resolved. This lasted for about 10 minutes and she chalked it up to probably not eating well. Over the weekend she was fine but then again this evening while she was working in the kitchen with her granddaughter she became briefly confused and was unable to speak anyone when she did she was unable to speak properly and was slurring little. This became concerning therefore her daughter go to the hospital for further evaluation. Patient denies having such symptoms in the past including any focal weakness or other neuro deficits. No recent medication changes. She had her knee repair and hammertoe repaired earlier this year but nothing otherwise.  In the ER patient was still having some expressive aphasia at first but as she was being evaluated by telemetry neurology and resolved. CT of the head was negative. He was determined to admit her for further evaluation. Initially her POC blood work showed hyperkalemia but she was actually hypokalemic on the BMP. She was given aspirin and potassium repletion.   Review of Systems: As per HPI otherwise 10 point review of systems negative.   Past Medical History:  Diagnosis Date  . Arthritis   . Complication of anesthesia    "pt. holds her breath when waking up"  . GERD (gastroesophageal reflux disease)   .  Hypertension   . Hypothyroidism   . PONV (postoperative nausea and vomiting)   . Staph infection    abdomen and returned 5 yrs. later    Past Surgical History:  Procedure Laterality Date  . ABDOMINAL HYSTERECTOMY    . CERVICAL FUSION  1990's  . COLON SURGERY    . FOOT SURGERY Left    reshaped  . HERNIA REPAIR    . JOINT REPLACEMENT Right    hip  . LUMBAR SPINE SURGERY    . REPLACEMENT TOTAL KNEE Right   . THYROID SURGERY    . TOTAL KNEE ARTHROPLASTY Left 08/21/2016   Procedure: LEFT TOTAL KNEE ARTHROPLASTY;  Surgeon: Renette Butters, MD;  Location: [facility];  Service: Orthopedics;  Laterality: Left;     reports that she has never smoked. She has never used smokeless tobacco. She reports that she does not drink alcohol or use drugs.  Allergies  Allergen Reactions  . Sulfamethoxazole Swelling    Lips swell and feel numb  . Sulfonamide Derivatives     History reviewed. No pertinent family history.  Acceptable: Family history reviewed and not pertinent (If you reviewed it)  Prior to Admission medications   Medication Sig Start Date End Date Taking? Authorizing Provider  aspirin EC 325 MG tablet Take 1 tablet (325 mg total) by mouth daily. For 30 days post op for DVT Prophylaxis 08/21/16   Prudencio Burly III, PA-C  Aspirin-Caffeine (BAYER BACK & BODY) 500-32.5 MG TABS Take 2 tablets by mouth 3 (three) times  daily as needed (for pain.).     [provider]  baclofen (LIORESAL) 10 MG tablet Take 1 tablet (10 mg total) by mouth 3 (three) times daily as needed for muscle spasms. 08/21/16   Prudencio Burly III, PA-C  Biotin 2500 MCG CAPS Take 5,000 mcg by mouth 2 (two) times daily.    [provider]  bisoprolol-hydrochlorothiazide (ZIAC) 2.5-6.25 MG tablet Take 1 tablet by mouth at bedtime. 06/21/16   [provider]  Calcium Carbonate-Vit D-Min (CALCIUM 600+D PLUS MINERALS) 600-400 MG-UNIT CHEW Chew 1 tablet by mouth daily.    [provider]  diclofenac (VOLTAREN) 75 MG EC tablet Take 75 mg by mouth 2 (two) times daily. 06/28/16   [provider]  docusate sodium (COLACE) 100 MG capsule Take 1 capsule (100 mg total) by mouth 2 (two) times daily. To prevent constipation while taking pain medication. 08/21/16   Prudencio Burly III, PA-C  DULoxetine (CYMBALTA) 30 MG capsule Take 30 mg by mouth daily. 06/28/16   [provider]  hydroxychloroquine (PLAQUENIL) 200 MG tablet Take 200 mg by mouth 2 (two) times daily.    [provider]  levothyroxine (SYNTHROID, LEVOTHROID) 75 MCG tablet Take 75 mcg by mouth daily before breakfast. 06/01/16   [provider]  milk thistle 175 MG tablet Take 350 mg by mouth daily.    [provider]  omeprazole (PRILOSEC) 20 MG capsule Take 1 capsule (20 mg total) by mouth daily. While taking anti inflammatory medicine daily 08/21/16   Prudencio Burly III, PA-C  ondansetron (ZOFRAN) 4 MG tablet Take 1 tablet (4 mg total) by mouth every 8 (eight) hours as needed for nausea or vomiting. 08/21/16   Prudencio Burly III, PA-C  oxyCODONE-acetaminophen (ROXICET) 5-325 MG tablet Take 1-2 tablets by mouth every 4 (four) hours as needed for severe pain. 08/21/16   Prudencio Burly III, PA-C  Turmeric 500 MG CAPS Take 1,000 mg by mouth 2 (two) times daily.    [provider]    Physical Exam: Vitals:   03/05/17 1815 03/05/17 1828 03/05/17 1830 03/05/17 1900  BP: (!) 178/87  (!) 184/85 (!) 174/80  Pulse: 95  97 92  Resp: 14  20 (!) 23  Temp:  98 F (36.7 C)    TempSrc:      SpO2: 100%  98% 97%  Weight:      Height:          Constitutional: NAD, calm, comfortable Vitals:   03/05/17 1815 03/05/17 1828 03/05/17 1830 03/05/17 1900  BP: (!) 178/87  (!) 184/85 (!) 174/80  Pulse: 95  97 92  Resp: 14  20 (!) 23  Temp:  98 F (36.7 C)    TempSrc:      SpO2: 100%  98% 97%  Weight:      Height:       Eyes: PERRL,  lids and conjunctivae normal ENMT: Mucous membranes are moist. Posterior pharynx clear of any exudate or lesions.Normal dentition.  Neck: normal, supple, no masses, no thyromegaly Respiratory: clear to auscultation bilaterally, no wheezing, no crackles. Normal respiratory effort. No accessory muscle use.  Cardiovascular: Regular rate and rhythm, no murmurs / rubs / gallops. No extremity edema. 2+ pedal pulses. No carotid bruits.  Abdomen: no tenderness, no masses palpated. No hepatosplenomegaly. Bowel sounds positive.  Musculoskeletal: no clubbing / cyanosis. No joint deformity upper and lower extremities. Good ROM, no contractures. Normal muscle tone.  Skin: no rashes, lesions,  ulcers. No induration Neurologic: CN 2-12 grossly intact. Sensation intact, DTR normal. Strength 5/5 in all 4.  Psychiatric: Normal judgment and insight. Alert and oriented x 3. Normal mood.     Labs on Admission: I have personally reviewed following labs and imaging studies  CBC:  Recent Labs Lab 03/05/17 1740 03/05/17 1816  WBC 5.8  --   NEUTROABS 3.1  --   HGB 13.4 12.9  HCT 38.1 38.0  MCV 87.4  --   PLT 224  --    Basic Metabolic Panel:  Recent Labs Lab 03/05/17 1740 03/05/17 1816  NA 128* 126*  K 3.3* 7.5*  CL 93* 94*  CO2 24  --   GLUCOSE 91 96  BUN 11 14  CREATININE 0.82 0.70  CALCIUM 9.3  --    GFR: Estimated Creatinine Clearance: 65.5 mL/min (by C-G formula based on SCr of 0.7 mg/dL). Liver Function Tests:  Recent Labs Lab 03/05/17 1740  AST 25  ALT 15  ALKPHOS 88  BILITOT 0.6  PROT 6.9  ALBUMIN 4.4   No results for input(s): LIPASE, AMYLASE in the last 168 hours. No results for input(s): AMMONIA in the last 168 hours. Coagulation Profile:  Recent Labs Lab 03/05/17 1740  INR 1.04   Cardiac Enzymes: No results for input(s): CKTOTAL, CKMB, CKMBINDEX, TROPONINI in the last 168 hours. BNP (last 3 results) No results for input(s): PROBNP in the last 8760  hours. HbA1C: No results for input(s): HGBA1C in the last 72 hours. CBG:  Recent Labs Lab 03/05/17 1732  GLUCAP 92   Lipid Profile: No results for input(s): CHOL, HDL, LDLCALC, TRIG, CHOLHDL, LDLDIRECT in the last 72 hours. Thyroid Function Tests: No results for input(s): TSH, T4TOTAL, FREET4, T3FREE, THYROIDAB in the last 72 hours. Anemia Panel: No results for input(s): VITAMINB12, FOLATE, FERRITIN, TIBC, IRON, RETICCTPCT in the last 72 hours. Urine analysis:    Component Value Date/Time   COLORURINE YELLOW 03/05/2017 [place] 03/05/2017 1733   LABSPEC 1.012 03/05/2017 1733   PHURINE 6.0 03/05/2017 1733   GLUCOSEU NEGATIVE 03/05/2017 1733   HGBUR SMALL (A) 03/05/2017 1733   BILIRUBINUR NEGATIVE 03/05/2017 1733   KETONESUR 5 (A) 03/05/2017 1733   PROTEINUR NEGATIVE 03/05/2017 1733   NITRITE NEGATIVE 03/05/2017 1733   LEUKOCYTESUR MODERATE (A) 03/05/2017 1733   Sepsis Labs: !!!!!!!!!!!!!!!!!!!!!!!!!!!!!!!!!!!!!!!!!!!! @LABRCNTIP (procalcitonin:4,lacticidven:4) )No results found for this or any previous visit (from the past 240 hour(s)).   Radiological Exams on Admission: Ct Head Code Stroke Wo Contrast  Result Date: 03/05/2017 CLINICAL DATA:  Code stroke. Initial evaluation for acute speech difficulty. EXAM: CT HEAD WITHOUT CONTRAST TECHNIQUE: Contiguous axial images were obtained from the base of the skull through the vertex without intravenous contrast. COMPARISON:  Prior CT from 08/31/2009. FINDINGS: Brain: Progressive cerebral atrophy with chronic microvascular ischemic disease. No acute intracranial hemorrhage. No evidence for acute large vessel territory infarct. No mass lesion, midline shift or mass effect. No hydrocephalus. No extra-axial fluid collection. Vascular: No asymmetric hyperdense vessel. Scattered vascular calcifications noted within the carotid siphons. Skull: Scalp soft tissues and calvarium within normal limits. Sinuses/Orbits: Globes and  orbital soft tissues normal. Paranasal sinuses and mastoid air cells are clear. Other: None. ASPECTS [facility] Stroke Program Early CT Score) - Ganglionic level infarction (caudate, lentiform nuclei, internal capsule, insula, M1-M3 cortex): 7 - Supraganglionic infarction (M4-M6 cortex): 3 Total score (0-10 with 10 being normal): 10 IMPRESSION: 1. No acute intracranial infarct or other process identified. 2. ASPECTS is 10. 3. Generalized  age-related cerebral atrophy with chronic microvascular ischemic disease, progressed relative to most recent CT from 2011. Critical Value/emergent results were called by telephone at the time of interpretation on 03/05/2017 at 5:57 pm to Dr. Orlie Dakin , who verbally acknowledged these results. Electronically Signed   By: Jeannine Boga M.D.   On: 03/05/2017 17:58    EKG: Independently reviewed. No acute ST-T changes.  Assessment/Plan Active Problems:   TIA (transient ischemic attack)   Expressive aphasia/slurred speech Transient ischemic attack -Admit for observation telemetry -CT of the head is negative, will order MRI brain for tomorrow morning -Check hemoglobin A1c, lipid panel, TSH -Aspirin given in the ER. Patient denies taking daily aspirin at home, from now on she will be. -Permissive hypertension, hold home antihypertensives -Hydralazine IV when necessary, Lopressor IV when necessary -Supportive care, ambulate with assistance. If necessary we'll get physical therapy and occupational therapy  Hypokalemia -Oral repletion given in the ER. Potassium of 7.5 was POC but 3.3 was on actual BMP therefore repletion given -Check again with a.m. labs  History of hypertension -Hold home medications. IV when necessary ordered  Arthritis status post knee surgery -Pain control  Peripheral neuropathy -On Cymbalta  Hypothyroidism -Continue Synthroid  GERD -Continue Protonix   DVT prophylaxis: Early ambulation Code Status: Full code Family  Communication: Son and daughter at bedside Consults called: Telemetry neurologist Admission status: Observation telemetry   Petrita Blunck Arsenio Loader MD [facility]   If 7PM-7AM, please contact night-coverage [email or web address] Password [email or web address]  03/05/2017, 7:40 PM

## 2017-03-06 ENCOUNTER — Observation Stay (HOSPITAL_BASED_OUTPATIENT_CLINIC_OR_DEPARTMENT_OTHER): Payer: PPO

## 2017-03-06 ENCOUNTER — Observation Stay (HOSPITAL_COMMUNITY): Payer: PPO

## 2017-03-06 DIAGNOSIS — I361 Nonrheumatic tricuspid (valve) insufficiency: Secondary | ICD-10-CM | POA: Diagnosis not present

## 2017-03-06 DIAGNOSIS — R4781 Slurred speech: Secondary | ICD-10-CM | POA: Diagnosis not present

## 2017-03-06 DIAGNOSIS — I340 Nonrheumatic mitral (valve) insufficiency: Secondary | ICD-10-CM | POA: Diagnosis not present

## 2017-03-06 DIAGNOSIS — I10 Essential (primary) hypertension: Secondary | ICD-10-CM | POA: Diagnosis present

## 2017-03-06 DIAGNOSIS — E876 Hypokalemia: Secondary | ICD-10-CM | POA: Diagnosis not present

## 2017-03-06 DIAGNOSIS — E871 Hypo-osmolality and hyponatremia: Secondary | ICD-10-CM | POA: Diagnosis present

## 2017-03-06 DIAGNOSIS — G459 Transient cerebral ischemic attack, unspecified: Secondary | ICD-10-CM | POA: Diagnosis not present

## 2017-03-06 DIAGNOSIS — I371 Nonrheumatic pulmonary valve insufficiency: Secondary | ICD-10-CM

## 2017-03-06 DIAGNOSIS — I6523 Occlusion and stenosis of bilateral carotid arteries: Secondary | ICD-10-CM | POA: Diagnosis not present

## 2017-03-06 LAB — ECHOCARDIOGRAM COMPLETE
CHL CUP DOP CALC LVOT VTI: 25.4 cm
CHL CUP MV DEC (S): 423
CHL CUP STROKE VOLUME: 35 mL
EWDT: 423 ms
FS: 29 % (ref 28–44)
HEIGHTINCHES: 60 in
IVS/LV PW RATIO, ED: 0.98
LA ID, A-P, ES: 45 mm
LA vol A4C: 52.9 ml
LADIAMINDEX: 2.31 cm/m2
LAVOL: 59.3 mL
LAVOLIN: 30.5 mL/m2
LEFT ATRIUM END SYS DIAM: 45 mm
LV SIMPSON'S DISK: 62
LV dias vol: 57 mL (ref 46–106)
LV sys vol index: 11 mL/m2
LVDIAVOLIN: 29 mL/m2
LVOT area: 3.14 cm2
LVOT peak grad rest: 6 mmHg
LVOT peak vel: 120 cm/s
LVOTD: 20 mm
LVOTSV: 80 mL
LVSYSVOL: 22 mL
Lateral S' vel: 12.5 cm/s
MV pk A vel: 77.1 m/s
MVPKEVEL: 51.1 m/s
PW: 13.2 mm — AB (ref 0.6–1.1)
Reg peak vel: 289 cm/s
TAPSE: 19.6 mm
TRMAXVEL: 289 cm/s
Weight: 3016 oz

## 2017-03-06 LAB — COMPREHENSIVE METABOLIC PANEL
ALK PHOS: 73 U/L (ref 38–126)
ALT: 12 U/L — AB (ref 14–54)
AST: 19 U/L (ref 15–41)
Albumin: 3.8 g/dL (ref 3.5–5.0)
Anion gap: 7 (ref 5–15)
BUN: 9 mg/dL (ref 6–20)
CALCIUM: 8.9 mg/dL (ref 8.9–10.3)
CO2: 25 mmol/L (ref 22–32)
CREATININE: 0.63 mg/dL (ref 0.44–1.00)
Chloride: 98 mmol/L — ABNORMAL LOW (ref 101–111)
Glucose, Bld: 79 mg/dL (ref 65–99)
Potassium: 3.9 mmol/L (ref 3.5–5.1)
Sodium: 130 mmol/L — ABNORMAL LOW (ref 135–145)
Total Bilirubin: 0.7 mg/dL (ref 0.3–1.2)
Total Protein: 5.8 g/dL — ABNORMAL LOW (ref 6.5–8.1)

## 2017-03-06 LAB — TROPONIN I: Troponin I: <0.03 ng/mL (ref <0.03)

## 2017-03-06 LAB — POCT I-STAT, CHEM 8
BUN: 14 mg/dL (ref 6–20)
CREATININE: 0.7 mg/dL (ref 0.44–1.00)
Calcium, Ion: 1.03 mmol/L — ABNORMAL LOW (ref 1.15–1.40)
Chloride: 94 mmol/L — ABNORMAL LOW (ref 101–111)
Glucose, Bld: 96 mg/dL (ref 65–99)
HCT: 38 % (ref 36.0–46.0)
Hemoglobin: 12.9 g/dL (ref 12.0–15.0)
POTASSIUM: 7.5 mmol/L — AB (ref 3.5–5.1)
Sodium: 126 mmol/L — ABNORMAL LOW (ref 135–145)
TCO2: 29 mmol/L (ref 22–32)

## 2017-03-06 LAB — HEMOGLOBIN A1C
Hgb A1c MFr Bld: 5.1 % (ref 4.8–5.6)
Mean Plasma Glucose: 99.67 mg/dL

## 2017-03-06 MED ORDER — METOPROLOL TARTRATE 25 MG PO TABS: 25.0000 mg | ORAL_TABLET | Freq: Two times a day (BID) | ORAL | 0 refills | Status: DC

## 2017-03-06 MED ORDER — ATORVASTATIN CALCIUM 40 MG PO TABS
80.0000 mg | ORAL_TABLET | Freq: Every day | ORAL | Status: DC
  Administered 2017-03-06: 80 mg via ORAL
  Filled 2017-03-06: qty 2

## 2017-03-06 MED ORDER — AMLODIPINE BESYLATE 5 MG PO TABS
5.0000 mg | ORAL_TABLET | Freq: Every day | ORAL | Status: DC
  Administered 2017-03-06: 5 mg via ORAL
  Filled 2017-03-06: qty 1

## 2017-03-06 MED ORDER — STROKE: EARLY STAGES OF RECOVERY BOOK
Freq: Once | Status: AC
  Administered 2017-03-06: 16:00:00
  Filled 2017-03-06: qty 1

## 2017-03-06 MED ORDER — ASPIRIN EC 325 MG PO TBEC: 325.0000 mg | DELAYED_RELEASE_TABLET | Freq: Every day | ORAL | 0 refills | Status: DC

## 2017-03-06 MED ORDER — ATORVASTATIN CALCIUM 40 MG PO TABS: 40.0000 mg | ORAL_TABLET | Freq: Every day | ORAL | 0 refills | Status: DC

## 2017-03-06 MED ORDER — AMLODIPINE BESYLATE 5 MG PO TABS: 5.0000 mg | ORAL_TABLET | Freq: Every day | ORAL | 0 refills | Status: DC

## 2017-03-06 NOTE — Discharge Summary (Addendum)
Physician Discharge Summary  [name] UXN:[id number] DOB: Feb 17, 1946 DOA: 03/05/2017  PCP: Helen Squibb, MD  Admit date: 03/05/2017 Discharge date: 03/06/2017  Admitted From: Home Disposition: Home  Recommendations for Outpatient Follow-up:  1. Follow up with PCP in 1-2 weeks. Please monitor be met during outpatient follow-up.  2. Follow-up with Dr [name] in 3-4 weeks.  Home Health: None Equipment/Devices: None  Discharge Condition: Stable CODE STATUS: Full code Diet recommendation: Heart Healthy   Discharge Diagnoses:  Principal problem   TIA (transient ischemic attack)  Active Problems:   Depression   Primary osteoarthritis of left knee   Essential hypertension   Hypokalemia   Hyponatremia  Brief narrative/history of present illness 71 y.o. female with medical history significant of arthritis, hypertension, hypothyroidism came to the hospital for evaluation of slurred speech and expressive aphasia. Patient states last Friday when she was at the car dealership buying a car all of a sudden she was unable to see her personal information including her name, address and birthday. Even though she knew what it was she was unable to say it. As she was running and down the piece of paper, it resolved. This lasted for about 10 minutes and she chalked it up to probably not eating well. Over the weekend she was fine but then again this evening while she was working in the kitchen with her granddaughter she became briefly confused and was unable to speak anyone when she did she was unable to speak properly and was slurring little. This became concerning therefore her daughter go to the hospital for further evaluation. Patient denies having such symptoms in the past including any focal weakness or other neuro deficits. No recent medication changes. She had her knee repair and hammertoe repaired earlier this year but nothing otherwise.  In the ER patient was still having some  expressive aphasia at first but as she was being evaluated by telemetry neurology and resolved. CT of the head was negative. He was determined to admit her for further evaluation. Initially her POC blood work showed hyperkalemia but she was actually hypokalemic on the BMP. She was given aspirin and potassium replenished. Observe for TIA.  Hospital course Transient ischemic attack Symptoms resolved. Risk factors include hypertension, obesity and family history of heart disease. CT head negative for acute findings. MRI brain negative for stroke and shows chronic small vessel disease. Carotid Doppler with less than 50% stenosis. 2-D echo with normal EF of 55 and 60% with grade 1 diastolic dysfunction, no wall motion abnormality or cardiac source of emboli.  Patient placed on full dose aspirin. LDL of 159 with total cholesterol of 238. Placed on Lipitor 40 mg daily. A1c of 5.1. Discussed on risk factor modifications including diet adherence, exercise to lose weight and lifestyle changes. Notified neurologist Dr [name] who agrees to see patient as outpatient. (Patient provided with contact information).  No further symptoms. Stable to be discharged home with outpatient follow-up.  Hyponatremia Possibly due to HCTZ which has been discontinued. Sodium improved and subsequent lab.  Essential hypertension HCTZ and bisoprolol discontinued, started on low-dose amlodipine and metoprolol.  Chronic arthritis. On diclofenac and high-dose aspirin. I have stopped her high-dose aspirin and instructed to use extra thin Tylenol. Also instructed to use diclofenac with caution while on full dose aspirin for risk of GI bleed and renal insufficiency.  Hypothyroidism Continue Synthroid. TSH normal.  Chronic depression  continue Cymbalta.  Procedures:   2-D echo CT head  MRI brain,  carotid  Doppler  Family communication: Daughter is at bedside   Consults: None   Discharge Instructions   Allergies  as of 03/06/2017      Reactions   Sulfamethoxazole Swelling   Lips swell and feel numb   Sulfonamide Derivatives       Medication List    STOP taking these medications   aspirin EC 81 MG tablet   BAYER BACK & BODY 500-32.5 MG Tabs Generic drug:  Aspirin-Caffeine   bisoprolol-hydrochlorothiazide 2.5-6.25 MG tablet Commonly known as:  ZIAC     TAKE these medications   amLODipine 5 MG tablet Commonly known as:  NORVASC Take 1 tablet (5 mg total) by mouth daily.  Aspirin 325 mg enteric coated tablet Take 1 tablet (332m) by mouth daily   atorvastatin 40 MG tablet Commonly known as:  LIPITOR Take 1 tablet (40 mg total) by mouth daily at 6 PM.   CALCIUM 600+D PLUS MINERALS 600-400 MG-UNIT Chew Chew 1 tablet by mouth daily.   diclofenac 75 MG EC tablet Commonly known as:  VOLTAREN Take 75 mg by mouth 2 (two) times daily.   docusate sodium 100 MG capsule Commonly known as:  COLACE Take 1 capsule (100 mg total) by mouth 2 (two) times daily. To prevent constipation while taking pain medication. What changed:  how much to take  when to take this  additional instructions   DULoxetine 30 MG capsule Commonly known as:  CYMBALTA Take 30 mg by mouth daily.   HAIR/SKIN/NAILS 1250-7.5-7.5 MCG-MG-UNT Chew Generic drug:  Biotin w/ Vitamins C & E Chew 2 each by mouth daily.   levothyroxine 75 MCG tablet Commonly known as:  SYNTHROID, LEVOTHROID Take 75 mcg by mouth daily before breakfast.   metoprolol tartrate 25 MG tablet Commonly known as:  LOPRESSOR Take 1 tablet (25 mg total) by mouth 2 (two) times daily.   milk thistle 175 MG tablet Take 250 mg by mouth daily.   omeprazole 20 MG capsule Commonly known as:  PRILOSEC Take 1 capsule (20 mg total) by mouth daily. While taking anti inflammatory medicine daily   ondansetron 4 MG tablet Commonly known as:  ZOFRAN Take 1 tablet (4 mg total) by mouth every 8 (eight) hours as needed for nausea or vomiting.   Turmeric  500 MG Caps Take 1,000 mg by mouth 2 (two) times daily.      Follow-up Information    HCelene Squibb MD. Schedule an appointment as soon as possible for a visit in 1 week(s).   Specialty:  Internal Medicine Contact information: [id number]         Allergies  Allergen Reactions  . Sulfamethoxazole Swelling    Lips swell and feel numb  . Sulfonamide Derivatives       Procedures/Studies: Mr Brain Wo Contrast  Result Date: 03/06/2017 CLINICAL DATA:  71y/o female with 2 days of slurred speech and weakness. EXAM: MRI HEAD WITHOUT CONTRAST TECHNIQUE: Multiplanar, multiecho pulse sequences of the brain and surrounding structures were obtained without intravenous contrast. COMPARISON:  Head CT 03/05/2017 and earlier. FINDINGS: Brain: No restricted diffusion to suggest acute infarction. No midline shift, mass effect, evidence of mass lesion, ventriculomegaly, extra-axial collection or acute intracranial hemorrhage. Cervicomedullary junction and pituitary are within normal limits. Cerebral volume is normal for age. Scattered small mostly central and subcortical white matter foci of T2 and FLAIR hyperintensity in both hemispheres. The configuration is nonspecific. The extent is mild to moderate for age. No  cortical encephalomalacia or chronic cerebral blood products. Deep gray matter nuclei, brainstem and cerebellum are normal for age. Vascular: Major intracranial vascular flow voids are preserved. Skull and upper cervical spine: Partially visible chronic C3-C4 disc degeneration, possible C3 level degenerative spinal stenosis. Sinuses/Orbits: Normal orbits soft tissues. Paranasal sinuses and mastoids are stable and well pneumatized. Other: Visible internal auditory structures appear normal. Scalp and face soft tissues appear negative. IMPRESSION: 1.  No acute intracranial abnormality. 2. Mild to moderate for age nonspecific cerebral white matter signal  changes, most commonly due to chronic small vessel disease. Electronically Signed   By: Genevie Ann M.D.   On: 03/06/2017 11:25   US Carotid Bilateral  Result Date: 03/06/2017 CLINICAL DATA:  TIA EXAM: BILATERAL CAROTID DUPLEX ULTRASOUND TECHNIQUE: Pearline Cables scale imaging, color Doppler and duplex ultrasound were performed of bilateral carotid and vertebral arteries in the neck. COMPARISON:  None. FINDINGS: Criteria: Quantification of carotid stenosis is based on velocity parameters that correlate the residual internal carotid diameter with NASCET-based stenosis levels, using the diameter of the distal internal carotid lumen as the denominator for stenosis measurement. The following velocity measurements were obtained: RIGHT ICA:  74 cm/sec CCA:  81 cm/sec SYSTOLIC ICA/CCA RATIO:  0.9 DIASTOLIC ICA/CCA RATIO:  1.9 ECA:  85 cm/sec LEFT ICA:  91 cm/sec CCA:  95 cm/sec SYSTOLIC ICA/CCA RATIO:  1.0 DIASTOLIC ICA/CCA RATIO:  0.6 ECA:  90 cm/sec RIGHT CAROTID ARTERY: Minimal calcified plaque in the bulb. Low resistance internal carotid Doppler pattern is preserved. RIGHT VERTEBRAL ARTERY:  Antegrade. LEFT CAROTID ARTERY: Mild calcified plaque in the bulb. Low resistance internal carotid Doppler pattern is preserved. LEFT VERTEBRAL ARTERY:  Antegrade. IMPRESSION: Less than 50% stenosis in the right and left internal carotid arteries. Electronically Signed   By: Marybelle Killings M.D.   On: 03/06/2017 12:20   Ct Head Code Stroke Wo Contrast  Result Date: 03/05/2017 CLINICAL DATA:  Code stroke. Initial evaluation for acute speech difficulty. EXAM: CT HEAD WITHOUT CONTRAST TECHNIQUE: Contiguous axial images were obtained from the base of the skull through the vertex without intravenous contrast. COMPARISON:  Prior CT from 08/31/2009. FINDINGS: Brain: Progressive cerebral atrophy with chronic microvascular ischemic disease. No acute intracranial hemorrhage. No evidence for acute large vessel territory infarct. No mass lesion,  midline shift or mass effect. No hydrocephalus. No extra-axial fluid collection. Vascular: No asymmetric hyperdense vessel. Scattered vascular calcifications noted within the carotid siphons. Skull: Scalp soft tissues and calvarium within normal limits. Sinuses/Orbits: Globes and orbital soft tissues normal. Paranasal sinuses and mastoid air cells are clear. Other: None. ASPECTS [facility] Stroke Program Early CT Score) - Ganglionic level infarction (caudate, lentiform nuclei, internal capsule, insula, M1-M3 cortex): 7 - Supraganglionic infarction (M4-M6 cortex): 3 Total score (0-10 with 10 being normal): 10 IMPRESSION: 1. No acute intracranial infarct or other process identified. 2. ASPECTS is 10. 3. Generalized age-related cerebral atrophy with chronic microvascular ischemic disease, progressed relative to most recent CT from 2011. Critical Value/emergent results were called by telephone at the time of interpretation on 03/05/2017 at 5:57 pm to Dr. Orlie Dakin , who verbally acknowledged these results. Electronically Signed   By: Jeannine Boga M.D.   On: 03/05/2017 17:58       Subjective: No further symptoms. No overnight events.  Discharge Exam: Vitals:   03/06/17 1038 03/06/17 1402  BP: (!) 151/95 (!) 152/65  Pulse:  85  Resp:  16  Temp:  97.9 F (36.6 C)  SpO2:  98%   Vitals:  03/06/17 0500 03/06/17 0648 03/06/17 1038 03/06/17 1402  BP:  (!) 149/69 (!) 151/95 (!) 152/65  Pulse:  87  85  Resp:  20  16  Temp:  98.2 F (36.8 C)  97.9 F (36.6 C)  TempSrc:  Oral  Oral  SpO2:  95%  98%  Weight: 85.5 kg (188 lb 8 oz)     Height:        General: Elderly female not in distress HEENT: Moist mucosa, supple neck Chest: Clear to auscultation bilaterally   CVS: Normal S1 and S2, no murmurs rub or gallop GI: Soft, nondistended, nontender, bowel sounds present Musculoskeletal: Warm, no edema CNS: Alert and oriented, nonfocal exam.     The results of significant diagnostics  from this hospitalization (including imaging, microbiology, ancillary and laboratory) are listed below for reference.     Microbiology: No results found for this or any previous visit (from the past 240 hour(s)).   Labs: BNP (last 3 results) No results for input(s): BNP in the last 8760 hours. Basic Metabolic Panel:  Recent Labs Lab 03/05/17 1740 03/05/17 1816 03/06/17 0159  NA 128* 126* 130*  K 3.3* 7.5* 3.9  CL 93* 94* 98*  CO2 24  --  25  GLUCOSE 91 96 79  BUN 11 14 9   CREATININE 0.82 0.70 0.63  CALCIUM 9.3  --  8.9   Liver Function Tests:  Recent Labs Lab 03/05/17 1740 03/06/17 0159  AST 25 19  ALT 15 12*  ALKPHOS 88 73  BILITOT 0.6 0.7  PROT 6.9 5.8*  ALBUMIN 4.4 3.8   No results for input(s): LIPASE, AMYLASE in the last 168 hours. No results for input(s): AMMONIA in the last 168 hours. CBC:  Recent Labs Lab 03/05/17 1740 03/05/17 1816  WBC 5.8  --   NEUTROABS 3.1  --   HGB 13.4 12.9  HCT 38.1 38.0  MCV 87.4  --   PLT 224  --    Cardiac Enzymes:  Recent Labs Lab 03/05/17 2027 03/06/17 0159 03/06/17 0753  TROPONINI <0.03 <0.03 <0.03   BNP: Invalid input(s): POCBNP CBG:  Recent Labs Lab 03/05/17 1732  GLUCAP 92   D-Dimer No results for input(s): DDIMER in the last 72 hours. Hgb A1c  Recent Labs  03/05/17 1740  HGBA1C 5.1   Lipid Profile  Recent Labs  03/05/17 2027  CHOL 238*  HDL 70  LDLCALC 159*  TRIG 44  CHOLHDL 3.4   Thyroid function studies  Recent Labs  03/05/17 1740  TSH 1.038   Anemia work up No results for input(s): VITAMINB12, FOLATE, FERRITIN, TIBC, IRON, RETICCTPCT in the last 72 hours. Urinalysis    Component Value Date/Time   COLORURINE YELLOW 03/05/2017 1733   APPEARANCEUR CLEAR 03/05/2017 1733   LABSPEC 1.012 03/05/2017 1733   PHURINE 6.0 03/05/2017 1733   GLUCOSEU NEGATIVE 03/05/2017 1733   HGBUR SMALL (A) 03/05/2017 1733   BILIRUBINUR NEGATIVE 03/05/2017 1733   KETONESUR 5 (A) 03/05/2017  1733   PROTEINUR NEGATIVE 03/05/2017 1733   NITRITE NEGATIVE 03/05/2017 1733   LEUKOCYTESUR MODERATE (A) 03/05/2017 1733   Sepsis Labs Invalid input(s): PROCALCITONIN,  WBC,  LACTICIDVEN Microbiology No results found for this or any previous visit (from the past 240 hour(s)).   Time coordinating discharge: < 30 minutes  SIGNED:   Louellen Molder, MD  [facility] 03/06/2017, 4:50 PM Pager   If 7PM-7AM, please contact night-coverage [email or web address] Password [email or web address]

## 2017-03-06 NOTE — Progress Notes (Signed)
Pt discharged home with personal belongings, prescriptions and stroke education. Patient discharged with her IV removed and site intact.

## 2017-03-06 NOTE — Discharge Instructions (Signed)
Transient Ischemic Attack °A transient ischemic attack (TIA) is a "warning stroke" that causes stroke-like symptoms. A TIA does not cause lasting damage to the brain. The symptoms of a TIA can happen fast and do not last long. It is important to know the symptoms of a TIA and what to do. This can help prevent stroke or death. °Follow these instructions at home: °· Take medicines only as told by your doctor. Make sure you understand all of the instructions. °· You may need to take aspirin or warfarin medicine. Warfarin needs to be taken exactly as told. °? Taking too much or too little warfarin is dangerous. Blood tests must be done as often as told by your doctor. A PT blood test measures how long it takes for blood to clot. Your PT is used to calculate another value called an INR. Your PT and INR help your doctor adjust your warfarin dosage. He or she will make sure you are taking the right amount. °? Food can cause problems with warfarin and affect the results of your blood tests. This is true for foods high in vitamin K. Eat the same amount of foods high in vitamin K each day. Foods high in vitamin K include spinach, kale, broccoli, cabbage, collard and turnip greens, Brussels sprouts, peas, cauliflower, seaweed, and parsley. Other foods high in vitamin K include beef and pork liver, green tea, and soybean oil. Eat the same amount of foods high in vitamin K each day. Avoid big changes in your diet. Tell your doctor before changing your diet. Talk to a food specialist (dietitian) if you have questions. °? Many medicines can cause problems with warfarin and affect your PT and INR. Tell your doctor about all medicines you take. This includes vitamins and dietary pills (supplements). Do not take or stop taking any prescribed or over-the-counter medicines unless your doctor tells you to. °? Warfarin can cause more bruising or bleeding. Hold pressure over any cuts for longer than normal. Talk to your doctor about other  side effects of warfarin. °? Avoid sports or activities that may cause injury or bleeding. °? Be careful when you shave, floss, or use sharp objects. °? Avoid or drink very little alcohol while taking warfarin. Tell your doctor if you change how much alcohol you drink. °? Tell your dentist and other doctors that you take warfarin before any procedures. °· Follow your diet program as told, if you are given one. °· Keep a healthy weight. °· Stay active. Try to get at least 30 minutes of activity on all or most days. °· Do not use any tobacco products, including cigarettes, chewing tobacco, or electronic cigarettes. If you need help quitting, ask your doctor. °· Limit alcohol intake to no more than 1 drink per day for nonpregnant women and 2 drinks per day for men. One drink equals 12 ounces of beer, 5 ounces of wine, or 1½ ounces of hard liquor. °· Do not abuse drugs. °· Keep your home safe so you do not fall. You can do this by: °? Putting grab bars in the bedroom and bathroom. °? Raising toilet seats. °? Putting a seat in the shower. °· Keep all follow-up visits as told by your doctor. This is important. °Contact a doctor if: °· Your personality changes. °· You have trouble swallowing. °· You have double vision. °· You are dizzy. °· You have a fever. °Get help right away if: °These symptoms may be an emergency. Do not wait to see if   the symptoms will go away. Get medical help right away. Call your local emergency services (911 in the [place] Do not drive yourself to the hospital. °· You have sudden weakness or lose feeling (go numb), especially on one side of the body. This can affect your: °? Face. °? Arm. °? Leg. °· You have sudden trouble walking. °· You have sudden trouble moving your arms or legs. °· You have sudden confusion. °· You have trouble talking. °· You have trouble understanding. °· You have sudden trouble seeing in one or both eyes. °· You lose your balance. °· Your movements are not smooth. °· You  have a sudden, very bad headache with no known cause. °· You have new chest pain. °· Your heartbeat is unsteady. °· You are partly or totally unaware of what is going on around you. ° °This information is not intended to replace advice given to you by your health care provider. Make sure you discuss any questions you have with your health care provider. °Document Released: 02/28/2008 Document Revised: 01/23/2016 Document Reviewed: 08/26/2013 °Elsevier Interactive Patient Education © 2018 [employer] ° °

## 2017-03-06 NOTE — Progress Notes (Signed)
Patient says she was told that she would see Dr. [name] from neurology.  I see no order for neuro consult. Have paged Dr. Clementeen Graham and notified.

## 2017-03-06 NOTE — Progress Notes (Signed)
*  PRELIMINARY RESULTS* Echocardiogram 2D Echocardiogram has been performed.  [name] 03/06/2017, 3:23 PM

## 2017-03-06 NOTE — Care Management Obs Status (Signed)
[facility] NOTIFICATION   Patient Details  Name: [name] MRN: [id number] Date of Birth: 09/16/1945   Medicare Observation Status Notification Given:  Yes    Sherald Barge, RN 03/06/2017, 9:19 AM

## 2017-03-09 DIAGNOSIS — E871 Hypo-osmolality and hyponatremia: Secondary | ICD-10-CM | POA: Diagnosis not present

## 2017-03-09 DIAGNOSIS — E782 Mixed hyperlipidemia: Secondary | ICD-10-CM | POA: Diagnosis not present

## 2017-03-09 DIAGNOSIS — F331 Major depressive disorder, recurrent, moderate: Secondary | ICD-10-CM | POA: Diagnosis not present

## 2017-03-09 DIAGNOSIS — I10 Essential (primary) hypertension: Secondary | ICD-10-CM | POA: Diagnosis not present

## 2017-03-09 DIAGNOSIS — I6529 Occlusion and stenosis of unspecified carotid artery: Secondary | ICD-10-CM | POA: Diagnosis not present

## 2017-03-09 DIAGNOSIS — M19049 Primary osteoarthritis, unspecified hand: Secondary | ICD-10-CM | POA: Diagnosis not present

## 2017-03-09 DIAGNOSIS — R51 Headache: Secondary | ICD-10-CM | POA: Diagnosis not present

## 2017-03-09 DIAGNOSIS — R4701 Aphasia: Secondary | ICD-10-CM | POA: Diagnosis not present

## 2017-03-09 DIAGNOSIS — M5080 Other cervical disc disorders, unspecified cervical region: Secondary | ICD-10-CM | POA: Diagnosis not present

## 2017-03-09 DIAGNOSIS — E039 Hypothyroidism, unspecified: Secondary | ICD-10-CM | POA: Diagnosis not present

## 2017-03-09 DIAGNOSIS — H9319 Tinnitus, unspecified ear: Secondary | ICD-10-CM | POA: Diagnosis not present

## 2017-03-09 DIAGNOSIS — E785 Hyperlipidemia, unspecified: Secondary | ICD-10-CM | POA: Diagnosis not present

## 2017-03-11 DIAGNOSIS — M2022 Hallux rigidus, left foot: Secondary | ICD-10-CM | POA: Diagnosis not present

## 2017-03-11 DIAGNOSIS — Z4789 Encounter for other orthopedic aftercare: Secondary | ICD-10-CM | POA: Diagnosis not present

## 2017-03-11 DIAGNOSIS — M19072 Primary osteoarthritis, left ankle and foot: Secondary | ICD-10-CM | POA: Diagnosis not present

## 2017-03-14 DIAGNOSIS — M25569 Pain in unspecified knee: Secondary | ICD-10-CM | POA: Diagnosis not present

## 2017-03-14 DIAGNOSIS — M5080 Other cervical disc disorders, unspecified cervical region: Secondary | ICD-10-CM | POA: Diagnosis not present

## 2017-03-14 DIAGNOSIS — R51 Headache: Secondary | ICD-10-CM | POA: Diagnosis not present

## 2017-03-14 DIAGNOSIS — I6529 Occlusion and stenosis of unspecified carotid artery: Secondary | ICD-10-CM | POA: Diagnosis not present

## 2017-03-14 DIAGNOSIS — R4701 Aphasia: Secondary | ICD-10-CM | POA: Diagnosis not present

## 2017-03-14 DIAGNOSIS — G3184 Mild cognitive impairment, so stated: Secondary | ICD-10-CM | POA: Diagnosis not present

## 2017-03-14 DIAGNOSIS — E039 Hypothyroidism, unspecified: Secondary | ICD-10-CM | POA: Diagnosis not present

## 2017-03-14 DIAGNOSIS — H9319 Tinnitus, unspecified ear: Secondary | ICD-10-CM | POA: Diagnosis not present

## 2017-03-14 DIAGNOSIS — I10 Essential (primary) hypertension: Secondary | ICD-10-CM | POA: Diagnosis not present

## 2017-03-14 DIAGNOSIS — E871 Hypo-osmolality and hyponatremia: Secondary | ICD-10-CM | POA: Diagnosis not present

## 2017-03-14 DIAGNOSIS — M19049 Primary osteoarthritis, unspecified hand: Secondary | ICD-10-CM | POA: Diagnosis not present

## 2017-03-14 DIAGNOSIS — E782 Mixed hyperlipidemia: Secondary | ICD-10-CM | POA: Diagnosis not present

## 2017-04-01 DIAGNOSIS — E039 Hypothyroidism, unspecified: Secondary | ICD-10-CM | POA: Diagnosis not present

## 2017-04-01 DIAGNOSIS — I10 Essential (primary) hypertension: Secondary | ICD-10-CM | POA: Diagnosis not present

## 2017-04-01 DIAGNOSIS — E782 Mixed hyperlipidemia: Secondary | ICD-10-CM | POA: Diagnosis not present

## 2017-04-04 DIAGNOSIS — I10 Essential (primary) hypertension: Secondary | ICD-10-CM | POA: Diagnosis not present

## 2017-04-04 DIAGNOSIS — M5080 Other cervical disc disorders, unspecified cervical region: Secondary | ICD-10-CM | POA: Diagnosis not present

## 2017-04-04 DIAGNOSIS — E785 Hyperlipidemia, unspecified: Secondary | ICD-10-CM | POA: Diagnosis not present

## 2017-04-04 DIAGNOSIS — F419 Anxiety disorder, unspecified: Secondary | ICD-10-CM | POA: Diagnosis not present

## 2017-04-04 DIAGNOSIS — F331 Major depressive disorder, recurrent, moderate: Secondary | ICD-10-CM | POA: Diagnosis not present

## 2017-04-16 ENCOUNTER — Ambulatory Visit (INDEPENDENT_AMBULATORY_CARE_PROVIDER_SITE_OTHER): Payer: PPO | Admitting: Neurology

## 2017-04-16 ENCOUNTER — Encounter: Payer: Self-pay | Admitting: Neurology

## 2017-04-16 VITALS — BP 174/92 | HR 72 | Ht 60.0 in | Wt 192.4 lb

## 2017-04-16 DIAGNOSIS — G43109 Migraine with aura, not intractable, without status migrainosus: Secondary | ICD-10-CM | POA: Diagnosis not present

## 2017-04-16 DIAGNOSIS — F419 Anxiety disorder, unspecified: Secondary | ICD-10-CM

## 2017-04-16 DIAGNOSIS — I10 Essential (primary) hypertension: Secondary | ICD-10-CM | POA: Diagnosis not present

## 2017-04-16 DIAGNOSIS — R002 Palpitations: Secondary | ICD-10-CM | POA: Diagnosis not present

## 2017-04-16 DIAGNOSIS — E785 Hyperlipidemia, unspecified: Secondary | ICD-10-CM | POA: Diagnosis not present

## 2017-04-16 NOTE — Patient Instructions (Addendum)
-   continue ASA for stroke prevention - follow up with Dr. Nevada Crane for high cholesterol management - your condition could be TIA or min-stroke, but sounds more like migraine equivalent or anxiety related but will continue stroke prevention at the same time - relaxation and de-stress to help anxiety - Follow up with your primary care physician for stroke risk factor modification. Recommend maintain blood pressure goal <130/80, diabetes with hemoglobin A1c goal below 7.0% and lipids with LDL cholesterol goal below 70 mg/dL.  - will do 30 day heart monitoring to rule out afib - check BP at home and record - follow up in 3 months.

## 2017-04-16 NOTE — Progress Notes (Signed)
NEUROLOGY CLINIC NEW PATIENT NOTE  NAME: [name] DOB: [id number] REFERRING PHYSICIAN: [name], MD  I saw [name] as a new consult in the neurovascular clinic today regarding  Chief Complaint  Patient presents with  . New Patient (Initial Visit)    Patient reports that she ran out of her cymbalta and started having a lot of symptoms, but returned to normal once she restarted the med.   Marland Kitchen  HPI: [name] is a 71 y.o. female with PMH of HTN, hypothyroidism who presents as a new patient for two episodes of speech difficulty.   Patient stated that on 03/01/17 she was in a car dealer to buy a car.  She had a sudden onset of confusion, cannot remember her social security number, address, but she was able to write down the number, shortly after her speech resolved. It lasted just minutes. Pt also complained of dimmed vision around the episode.   On 03/05/17, pt at home watching TV and she had visual disturbance like looking out through binocular lens. She then worked with her granddaughter with cooking then had another episode of speech difficulty, not knowing granddaughter name, garbled speech, words came out not right, with confusion. She was sent to ED, while evaluation with teleneurology, her speech symptoms resolved. However, her vision of seeing through binocular lens gradually getting better and resolved in 2 days. She also stated shortly before this episode, he had sharp right side HA, resolved with speech symptoms. During the hospitalization, she underwent extensive work up, MRI and CT head and carotid doppler unremarkable. LDL 159 and A1C 5.1. She was discharged on ASA and lipitor.   She stated that she has HA several times a week, right side, usually short, not lasting more than 5 min. Some bad HAs she needs to take ASA, no more than 2 per month.   She has some stress going on at home, her mom not in good health, in and out of hospital, recently in SNF. 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 She also has stress at home with granddaughters.   She followed with Dr. Nevada Crane, PCP on 04/01/17 and repeat LDL 140, normal TSH and FT4. Due to concerns of side effect, lipitor was discontinued temporarily. Currently managing for HTN and will revisit HLD later.   Pt has Hx of HTN and hypothyroidism, on meds. Denies smoking or alcohol or illicit drugs.   Past Medical History:  Diagnosis Date  . Arthritis   . Complication of anesthesia    "pt. holds her breath when waking up"  . GERD (gastroesophageal reflux disease)   . Hypertension   . Hypothyroidism   . PONV (postoperative nausea and vomiting)   . Staph infection    abdomen and returned 5 yrs. later   Past Surgical History:  Procedure Laterality Date  . ABDOMINAL HYSTERECTOMY    . CERVICAL FUSION  1990's  . COLON SURGERY    . FOOT SURGERY Left    reshaped  . HERNIA REPAIR    . JOINT REPLACEMENT Right    hip  . LUMBAR SPINE SURGERY    . REPLACEMENT TOTAL KNEE Right   . THYROID SURGERY     No family history on file. Current Outpatient Medications  Medication Sig Dispense Refill  . amLODipine (NORVASC) 5 MG tablet Take 1 tablet (5 mg total) by mouth daily. 30 tablet 0  . aspirin EC 325 MG tablet Take 1 tablet (325 mg total) by  mouth daily. 30 tablet 0  . Biotin w/ Vitamins C & E (HAIR/SKIN/NAILS) 1250-7.5-7.5 MCG-MG-UNT CHEW Chew 2 each by mouth daily.    . Calcium Carbonate-Vit D-Min (CALCIUM 600+D PLUS MINERALS) 600-400 MG-UNIT CHEW Chew 1 tablet by mouth daily.    . diclofenac (VOLTAREN) 75 MG EC tablet Take 75 mg by mouth 2 (two) times daily.    Marland Kitchen docusate sodium (COLACE) 100 MG capsule Take 1 capsule (100 mg total) by mouth 2 (two) times daily. To prevent constipation while taking pain medication. (Patient taking differently: Take 300 mg by mouth at bedtime. To prevent constipation while taking pain medication.) 60 capsule 0  . DULoxetine (CYMBALTA) 30 MG capsule Take 30 mg by mouth daily.    Marland Kitchen  levothyroxine (SYNTHROID, LEVOTHROID) 75 MCG tablet Take 75 mcg by mouth daily before breakfast.    . milk thistle 175 MG tablet Take 250 mg by mouth daily.     . nebivolol (BYSTOLIC) 10 MG tablet Take 10 mg daily by mouth.    Marland Kitchen omeprazole (PRILOSEC) 20 MG capsule Take 1 capsule (20 mg total) by mouth daily. While taking anti inflammatory medicine daily 30 capsule 2  . ondansetron (ZOFRAN) 4 MG tablet Take 1 tablet (4 mg total) by mouth every 8 (eight) hours as needed for nausea or vomiting. 40 tablet 0  . Turmeric 500 MG CAPS Take 1,000 mg by mouth 2 (two) times daily.     No current facility-administered medications for this visit.    Allergies  Allergen Reactions  . Sulfamethoxazole Swelling    Lips swell and feel numb  . Sulfonamide Derivatives    Social History   Socioeconomic History  . Marital status: Widowed    Spouse name: Not on file  . Number of children: Not on file  . Years of education: Not on file  . Highest education level: Not on file  Social Needs  . Financial resource strain: Not on file  . Food insecurity - worry: Not on file  . Food insecurity - inability: Not on file  . Transportation needs - medical: Not on file  . Transportation needs - non-medical: Not on file  Occupational History  . Not on file  Tobacco Use  . Smoking status: Never Smoker  . Smokeless tobacco: Never Used  Substance and Sexual Activity  . Alcohol use: No  . Drug use: No  . Sexual activity: Not on file  Other Topics Concern  . Not on file  Social History Narrative  . Not on file    Review of Systems Full 14 system review of systems performed and notable only for those listed, all others are neg:  Constitutional:   Cardiovascular:  Ear/Nose/Throat: Ringing in ears Skin:  Eyes: Blurred vision, eye pain Respiratory: Snoring Gastroitestinal:   Genitourinary:  Hematology/Lymphatic: Easy bruising Endocrine: Feeling hot, feeling cold, increased thirst Musculoskeletal: Joint  pain, cramps, aching muscles Allergy/Immunology:   Neurological: Memory loss, confusion, numbness Psychiatric: Decreased energy Sleep: Sleepiness, snoring   Physical Exam  Vitals:   04/16/17 1538  BP: (!) 174/92  Pulse: 72    General - Well nourished, well developed, in no apparent distress.  Ophthalmologic - Sharp disc margins OU.  Cardiovascular - Regular rate and rhythm with no murmur. Carotid pulses were 2+ without bruits .   Neck - supple, no nuchal rigidity .  Mental Status -  Level of arousal and orientation to time, place, and person were intact. Language including expression, naming, repetition, comprehension, reading, and  writing was assessed and found intact. Fund of Knowledge was assessed and was intact.  Cranial Nerves II - XII - II - Visual field intact OU. III, IV, VI - Extraocular movements intact. V - Facial sensation intact bilaterally. VII - Facial movement intact bilaterally. VIII - Hearing & vestibular intact bilaterally. X - Palate elevates symmetrically. XI - Chin turning & shoulder shrug intact bilaterally. XII - Tongue protrusion intact.  Motor Strength - The patient's strength was normal in all extremities and pronator drift was absent.  Bulk was normal and fasciculations were absent.   Motor Tone - Muscle tone was assessed at the neck and appendages and was normal.  Reflexes - The patient's reflexes were normal in all extremities and she had no pathological reflexes.  Sensory - Light touch, temperature/pinprick, vibration and proprioception, and Romberg testing were assessed and were normal.    Coordination - The patient had normal movements in the hands and feet with no ataxia or dysmetria.  Tremor was absent.  Gait and Station - The patient's transfers, posture, gait, station, and turns were observed as normal.   Imaging  I have personally reviewed the radiological images below and agree with the radiology interpretations.  MRI  brain -No acute intracranial abnormality -Mild to moderate of age nonspecific cerebral white matter signal changes, most commonly due to chronic small vessel disease  CT head No acute intracranial infarct-or other processes identified ASPECTS is 10 Generalized age-related cerebral atrophy with chronic microvascular ischemic disease, progressed relative to most recent CT from 2011  Carotid Doppler -less than 50% stenosis in the right and left intracranial carotid arteries  TTE - Left ventricle: The cavity size was normal. Systolic function was   normal. The estimated ejection fraction was in the range of 55%   to 60%. Wall motion was normal; there were no regional wall   motion abnormalities. Doppler parameters are consistent with   abnormal left ventricular relaxation (grade 1 diastolic   dysfunction). - Mitral valve: There was moderate regurgitation. - Atrial septum: No defect or patent foramen ovale was identified. - Tricuspid valve: There was mild regurgitation. - Pulmonic valve: There was mild regurgitation. - Pulmonary arteries: PA peak pressure: 41 mm Hg (S). - Systemic veins: The IVC was suboptimally visualized, but appeared   dilated. There was normal respiratory variation. Estimated RAP 8   mmHg.  Lab Review Component     Latest Ref Rng & Units 03/05/2017  Cholesterol     0 - 200 mg/dL 238 (H)  Triglycerides     <150 mg/dL 44  HDL Cholesterol     >40 mg/dL 70  Total CHOL/HDL Ratio     RATIO 3.4  VLDL     0 - 40 mg/dL 9  LDL (calc)     0 - 99 mg/dL 159 (H)  Hemoglobin A1C     4.8 - 5.6 % 5.1  Mean Plasma Glucose     mg/dL 99.67  TSH     0.350 - 4.500 uIU/mL 1.038     Assessment and Plan:   In summary, [name] is a 71 y.o. female with PMH of HTN, hypothyroidism who presents as a new patient for two episodes of speech difficulty, confusion, visual disturbance, and HA. Although TIA can not be ruled out, her episodes more likely migraine equivalent or  anxiety related, especially with visual disturbance. However, will need TIA/stroke prevention. Work up with MRI, CT, CUS, TTE unremarkable. A1C 5.1 and LDL 159. Discharged with ASA  and lipitor. Followed up with PCP repeat LDL 140, so far off lipitor temporarily. Will need 30 day cardiac event monitoring to rule out afib to finish stroke work up.  - continue ASA for stroke prevention - follow up with Dr. Nevada Crane for high cholesterol management - relaxation and de-stress to help anxiety - Follow up with your primary care physician for stroke risk factor modification. Recommend maintain blood pressure goal <130/80, diabetes with hemoglobin A1c goal below 7.0% and lipids with LDL cholesterol goal below 70 mg/dL.  - will do 30 day heart monitoring to rule out afib - check BP at home and record - follow up in 3 months.   I recommend aggressive blood pressure control with a goal <130/80 mm Hg.  Lipids should be managed intensively, with a goal LDL < 70 mg/dL.  I encouraged the patient to discuss these important issues with her primary care physician.  I counseled the patient on measures to reduce stroke risk, including the importance of medication compliance, risk factor control, exercise, healthy diet, and avoidance of smoking.  I reviewed stroke warning signs and symptoms and appropriate actions to take if such occurs.   Thank you very much for the opportunity to participate in the care of this patient.  Please do not hesitate to call if any questions or concerns arise.  Orders Placed This Encounter  Procedures  . CARDIAC EVENT MONITOR    Standing Status:   Future    Standing Expiration Date:   04/17/2018    Scheduling Instructions:     Request cardionet setup. Thank you.    Order Specific Question:   Where should this test be performed?    Answer:   [facility]    Meds ordered this encounter  Medications  . nebivolol (BYSTOLIC) 10 MG tablet    Sig: Take 10 mg daily by mouth.    Patient  Instructions  - continue ASA for stroke prevention - follow up with Dr. Nevada Crane for high cholesterol management - your condition could be TIA or min-stroke, but sounds more like migraine equivalent or anxiety related but will continue stroke prevention at the same time - relaxation and de-stress to help anxiety - Follow up with your primary care physician for stroke risk factor modification. Recommend maintain blood pressure goal <130/80, diabetes with hemoglobin A1c goal below 7.0% and lipids with LDL cholesterol goal below 70 mg/dL.  - will do 30 day heart monitoring to rule out afib - check BP at home and record - follow up in 3 months.    Rosalin Hawking, MD PhD [facility] [address] [place], [place] [address] [phone]

## 2017-04-18 DIAGNOSIS — I10 Essential (primary) hypertension: Secondary | ICD-10-CM | POA: Diagnosis not present

## 2017-04-18 DIAGNOSIS — F331 Major depressive disorder, recurrent, moderate: Secondary | ICD-10-CM | POA: Diagnosis not present

## 2017-04-30 DIAGNOSIS — H52203 Unspecified astigmatism, bilateral: Secondary | ICD-10-CM | POA: Diagnosis not present

## 2017-04-30 DIAGNOSIS — H524 Presbyopia: Secondary | ICD-10-CM | POA: Diagnosis not present

## 2017-04-30 DIAGNOSIS — H5203 Hypermetropia, bilateral: Secondary | ICD-10-CM | POA: Diagnosis not present

## 2017-04-30 DIAGNOSIS — G43909 Migraine, unspecified, not intractable, without status migrainosus: Secondary | ICD-10-CM | POA: Diagnosis not present

## 2017-06-05 ENCOUNTER — Other Ambulatory Visit: Payer: Self-pay | Admitting: Neurology

## 2017-06-05 DIAGNOSIS — R002 Palpitations: Secondary | ICD-10-CM

## 2017-06-05 DIAGNOSIS — G459 Transient cerebral ischemic attack, unspecified: Secondary | ICD-10-CM

## 2017-06-06 ENCOUNTER — Ambulatory Visit (INDEPENDENT_AMBULATORY_CARE_PROVIDER_SITE_OTHER): Payer: PPO

## 2017-06-06 DIAGNOSIS — G459 Transient cerebral ischemic attack, unspecified: Secondary | ICD-10-CM

## 2017-06-06 DIAGNOSIS — R002 Palpitations: Secondary | ICD-10-CM

## 2017-07-18 ENCOUNTER — Telehealth: Payer: Self-pay

## 2017-07-18 ENCOUNTER — Ambulatory Visit: Payer: PPO | Admitting: Nurse Practitioner

## 2017-07-18 NOTE — Telephone Encounter (Signed)
Rn call patient that her heart monitor was negative for irregular heart beats. Continue treatment plan. PT verbalized understanding. ------

## 2017-07-18 NOTE — Telephone Encounter (Signed)
-----   Message from Rosalin Hawking, MD sent at 07/18/2017  2:20 PM EST ----- Could you please let the patient know that the heart monitoring test done recently was negative for irregular heart beats. Please continue current treatment. Thanks.  Rosalin Hawking, MD PhD Stroke Neurology 07/18/2017 2:20 PM

## 2017-07-23 DIAGNOSIS — I10 Essential (primary) hypertension: Secondary | ICD-10-CM | POA: Diagnosis not present

## 2017-07-23 DIAGNOSIS — M5080 Other cervical disc disorders, unspecified cervical region: Secondary | ICD-10-CM | POA: Diagnosis not present

## 2017-07-23 DIAGNOSIS — F331 Major depressive disorder, recurrent, moderate: Secondary | ICD-10-CM | POA: Diagnosis not present

## 2017-07-23 DIAGNOSIS — E785 Hyperlipidemia, unspecified: Secondary | ICD-10-CM | POA: Diagnosis not present

## 2017-07-25 DIAGNOSIS — Z6838 Body mass index (BMI) 38.0-38.9, adult: Secondary | ICD-10-CM | POA: Diagnosis not present

## 2017-07-25 DIAGNOSIS — E782 Mixed hyperlipidemia: Secondary | ICD-10-CM | POA: Diagnosis not present

## 2017-07-25 DIAGNOSIS — I10 Essential (primary) hypertension: Secondary | ICD-10-CM | POA: Diagnosis not present

## 2017-08-21 ENCOUNTER — Ambulatory Visit (INDEPENDENT_AMBULATORY_CARE_PROVIDER_SITE_OTHER): Payer: PPO | Admitting: Adult Health

## 2017-08-21 ENCOUNTER — Encounter: Payer: Self-pay | Admitting: Adult Health

## 2017-08-21 ENCOUNTER — Encounter (INDEPENDENT_AMBULATORY_CARE_PROVIDER_SITE_OTHER): Payer: Self-pay

## 2017-08-21 VITALS — BP 151/87 | HR 67 | Wt 196.2 lb

## 2017-08-21 DIAGNOSIS — I10 Essential (primary) hypertension: Secondary | ICD-10-CM

## 2017-08-21 DIAGNOSIS — E785 Hyperlipidemia, unspecified: Secondary | ICD-10-CM

## 2017-08-21 DIAGNOSIS — F419 Anxiety disorder, unspecified: Secondary | ICD-10-CM | POA: Diagnosis not present

## 2017-08-21 DIAGNOSIS — G43109 Migraine with aura, not intractable, without status migrainosus: Secondary | ICD-10-CM

## 2017-08-21 MED ORDER — ATORVASTATIN CALCIUM 40 MG PO TABS: 40.0000 mg | ORAL_TABLET | Freq: Every day | ORAL | 3 refills | Status: DC

## 2017-08-21 NOTE — Progress Notes (Signed)
NEUROLOGY CLINIC NEW PATIENT NOTE  NAME: Helen DEDE DOB: [birth year] REFERRING PHYSICIAN: [name], MD  I saw [name] for a follow-up Chief Complaint  Patient presents with  . Follow-up    Follow up from speech,pt is seeing eye doctor  .  HPI: [name] is a 72 y.o. female with PMH of HTN, hypothyroidism who presents as a new patient for two episodes of speech difficulty.  Patient stated that on 03/01/17 she was in a car dealer to buy a car.  She had a sudden onset of confusion, cannot remember her social security number, address, but she was able to write down the number, shortly after her speech resolved. It lasted just minutes. Pt also complained of dimmed vision around the episode.  On 03/05/17, pt at home watching TV and she had visual disturbance like looking out through binocular lens. She then worked with her granddaughter with cooking then had another episode of speech difficulty, not knowing granddaughter name, garbled speech, words came out not right, with confusion. She was sent to ED, while evaluation with teleneurology, her speech symptoms resolved. However, her vision of seeing through binocular lens gradually getting better and resolved in 2 days. She also stated shortly before this episode, he had sharp right side HA, resolved with speech symptoms. During the hospitalization, she underwent extensive work up, MRI and CT head and carotid doppler unremarkable. LDL 159 and A1C 5.1. She was discharged on ASA and lipitor.  She stated that she has HA several times a week, right side, usually short, not lasting more than 5 min. Some bad HAs she needs to take ASA, no more than 2 per month.  She has some stress going on at home, her mom not in good health, in and out of hospital, recently in SNF. 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 She also has stress at home with granddaughters.  She followed with Dr. Nevada Crane, PCP on 04/01/17 and repeat LDL 140, normal TSH and FT4. Due  to concerns of side effect, lipitor was discontinued temporarily. Currently managing for HTN and will revisit HLD later.  Pt has Hx of HTN and hypothyroidism, on meds. Denies smoking or alcohol or illicit drugs.  UPDATE 08/21/17: Patient returns today for six-month follow-up.  She continues to have headaches occurring 2-3 times per week on the right side that radiates from her neck up into her eye.  Denies symptoms such as tunnel vision, aphasia or confusion.  She does have stress and tension in her life as she is the sole care giver for her 59 year old mother.  Most recent LDL 182 and 07/23/2017.  No statins have been started at this time for unknown reasons.  Blood pressure today mildly elevated at 151/87.  Patient states typically SBP ranges 130-140.  Continues to take aspirin 325 mg without increase of bleeding or bruising.  Patient admits to snoring but denies other symptoms of sleep apnea such as daytime sleepiness, napping frequently, or falling asleep easily. 30-day cardiac monitor completed and was negative for A. fib.  No new or worsening TIA/strokelike symptoms.    Past Medical History:  Diagnosis Date  . Arthritis   . Complication of anesthesia    "pt. holds her breath when waking up"  . GERD (gastroesophageal reflux disease)   . Hypertension   . Hypothyroidism   . PONV (postoperative nausea and vomiting)   . Staph infection    abdomen and returned 5 yrs. later   Past  Surgical History:  Procedure Laterality Date  . ABDOMINAL HYSTERECTOMY    . CERVICAL FUSION  1990's  . COLON SURGERY    . FOOT SURGERY Left    reshaped  . HERNIA REPAIR    . JOINT REPLACEMENT Right    hip  . LUMBAR SPINE SURGERY    . REPLACEMENT TOTAL KNEE Right   . THYROID SURGERY    . TOTAL KNEE ARTHROPLASTY Left 08/21/2016   Procedure: LEFT TOTAL KNEE ARTHROPLASTY;  Surgeon: Renette Butters, MD;  Location: [facility];  Service: Orthopedics;  Laterality: Left;   History reviewed. No pertinent family  history. Current Outpatient Medications  Medication Sig Dispense Refill  . amLODipine (NORVASC) 5 MG tablet Take 1 tablet (5 mg total) by mouth daily. 30 tablet 0  . aspirin EC 325 MG tablet Take 1 tablet (325 mg total) by mouth daily. 30 tablet 0  . Biotin w/ Vitamins C & E (HAIR/SKIN/NAILS) 1250-7.5-7.5 MCG-MG-UNT CHEW Chew 2 each by mouth daily.    . Calcium Carbonate-Vit D-Min (CALCIUM 600+D PLUS MINERALS) 600-400 MG-UNIT CHEW Chew 1 tablet by mouth daily.    . diclofenac (VOLTAREN) 75 MG EC tablet Take 75 mg by mouth 2 (two) times daily.    Marland Kitchen docusate sodium (COLACE) 100 MG capsule Take 1 capsule (100 mg total) by mouth 2 (two) times daily. To prevent constipation while taking pain medication. (Patient taking differently: Take 300 mg by mouth at bedtime. To prevent constipation while taking pain medication.) 60 capsule 0  . DULoxetine (CYMBALTA) 30 MG capsule Take 30 mg by mouth daily.    Marland Kitchen levothyroxine (SYNTHROID, LEVOTHROID) 75 MCG tablet Take 75 mcg by mouth daily before breakfast.    . milk thistle 175 MG tablet Take 250 mg by mouth daily.     . nebivolol (BYSTOLIC) 10 MG tablet Take 10 mg daily by mouth.    Marland Kitchen omeprazole (PRILOSEC) 20 MG capsule Take 1 capsule (20 mg total) by mouth daily. While taking anti inflammatory medicine daily 30 capsule 2  . ondansetron (ZOFRAN) 4 MG tablet Take 1 tablet (4 mg total) by mouth every 8 (eight) hours as needed for nausea or vomiting. 40 tablet 0  . Turmeric 500 MG CAPS Take 1,000 mg by mouth 2 (two) times daily.     No current facility-administered medications for this visit.    Allergies  Allergen Reactions  . Sulfamethoxazole Swelling    Lips swell and feel numb  . Sulfonamide Derivatives    Social History   Socioeconomic History  . Marital status: Widowed    Spouse name: Not on file  . Number of children: Not on file  . Years of education: Not on file  . Highest education level: Not on file  Social Needs  . Financial resource  strain: Not on file  . Food insecurity - worry: Not on file  . Food insecurity - inability: Not on file  . Transportation needs - medical: Not on file  . Transportation needs - non-medical: Not on file  Occupational History  . Not on file  Tobacco Use  . Smoking status: Never Smoker  . Smokeless tobacco: Never Used  Substance and Sexual Activity  . Alcohol use: No  . Drug use: No  . Sexual activity: Not on file  Other Topics Concern  . Not on file  Social History Narrative  . Not on file    Review of Systems Full 14 system review of systems performed and notable only for those  listed, all others are neg:  Excessive sweating, ringing in ears, runny nose, eye itching, double vision, blurred vision, cough, snoring, depression and nervous/anxious   Physical Exam  Vitals:   08/21/17 1511  BP: (!) 151/87  Pulse: 71    General - Well nourished, elderly Caucasian female, well developed, in no apparent distress.  Ophthalmologic - Sharp disc margins OU.  Cardiovascular - Regular rate and rhythm with no murmur. Carotid pulses were 2+ without bruits .   Neck - supple, no nuchal rigidity .  Mental Status -  Level of arousal and orientation to time, place, and person were intact. Language including expression, naming, repetition, comprehension, reading, and writing was assessed and found intact. Fund of Knowledge was assessed and was intact.  Cranial Nerves II - XII - II - Visual field intact OU. III, IV, VI - Extraocular movements intact. V - Facial sensation intact bilaterally. VII - Facial movement intact bilaterally. VIII - Hearing & vestibular intact bilaterally. X - Palate elevates symmetrically. XI - Chin turning & shoulder shrug intact bilaterally. XII - Tongue protrusion intact.  Motor Strength - The patient's strength was normal in all extremities and pronator drift was absent.  Bulk was normal and fasciculations were absent.   Motor Tone - Muscle tone was  assessed at the neck and appendages and was normal.  Reflexes - The patient's reflexes were normal in all extremities and she had no pathological reflexes.  Sensory - Light touch, temperature/pinprick, vibration and proprioception, and Romberg testing were assessed and were normal.    Coordination - The patient had normal movements in the hands and feet with no ataxia or dysmetria.  Tremor was absent.  Gait and Station - The patient's transfers, posture, gait, station, and turns were observed as normal.   Imaging  I have personally reviewed the radiological images below and agree with the radiology interpretations.  MRI brain -No acute intracranial abnormality -Mild to moderate of age nonspecific cerebral white matter signal changes, most commonly due to chronic small vessel disease  CT head No acute intracranial infarct-or other processes identified ASPECTS is 10 Generalized age-related cerebral atrophy with chronic microvascular ischemic disease, progressed relative to most recent CT from 2011  Carotid Doppler -less than 50% stenosis in the right and left intracranial carotid arteries  TTE - Left ventricle: The cavity size was normal. Systolic function was   normal. The estimated ejection fraction was in the range of 55%   to 60%. Wall motion was normal; there were no regional wall   motion abnormalities. Doppler parameters are consistent with   abnormal left ventricular relaxation (grade 1 diastolic   dysfunction). - Mitral valve: There was moderate regurgitation. - Atrial septum: No defect or patent foramen ovale was identified. - Tricuspid valve: There was mild regurgitation. - Pulmonic valve: There was mild regurgitation. - Pulmonary arteries: PA peak pressure: 41 mm Hg (S). - Systemic veins: The IVC was suboptimally visualized, but appeared   dilated. There was normal respiratory variation. Estimated RAP 8   mmHg.    Assessment and Plan:   In summary, [name] is a 72 y.o. female with PMH of HTN, hypothyroidism who presents as a new patient for two episodes of speech difficulty, confusion, visual disturbance, and HA. Although TIA can not be ruled out, her episodes more likely migraine equivalent or anxiety related, especially with visual disturbance.  Continues to have headaches 2-3 times per week but denies visual disturbances or other neurological symptoms.  No  new stroke/TIA symptoms.  -Continue aspirin 325 mg daily -start lipitor 40mg  daily for cholesterol management -patient advised to have PCP check lipid levels at next appointment which is in 2 months.  Provided with education for preventing high cholesterol. -Have PCP follow up on your cholesterol and blood pressure for management -Neck stretches, relaxation techniques and massage for tension headaches - provided with stress and stress management education  -Maintain strict control of hypertension with blood pressure goal below 130/90, diabetes with hemoglobin A1c goal below 6.5% and cholesterol with LDL cholesterol (bad cholesterol) goal below 70 mg/dL. I also advised the patient to eat a healthy diet with plenty of whole grains, cereals, fruits and vegetables, exercise regularly and maintain ideal body weight.  Followup in the future with me in 6 months or call earlier if needed  Greater than 50% time during this 25 minute consultation visit was spent on counseling and coordination of care about HTN, HLD and tension headaches (risk factors), discussion about risk benefit of anticoagulation and answering questions.    Venancio Poisson, AGNP-BC  [facility] [address] [place], [place] [address]  Phone [phone] Fax [phone]

## 2017-08-21 NOTE — Patient Instructions (Addendum)
Continue aspirin 325 mg daily  and start lipitor 92m  for secondary stroke prevention  Have PCP follow up on your cholesterol and blood pressure  Neck stretches, relaxation techniques and massage for tension headaches   Maintain strict control of hypertension with blood pressure goal below 130/90, diabetes with hemoglobin A1c goal below 6.5% and cholesterol with LDL cholesterol (bad cholesterol) goal below 70 mg/dL. I also advised the patient to eat a healthy diet with plenty of whole grains, cereals, fruits and vegetables, exercise regularly and maintain ideal body weight.  Followup in the future with me in 6 months or call earlier if needed    Stress and Stress Management Stress is a normal reaction to life events. It is what you feel when life demands more than you are used to or more than you can handle. Some stress can be useful. For example, the stress reaction can help you catch the last bus of the day, study for a test, or meet a deadline at work. But stress that occurs too often or for too long can cause problems. It can affect your emotional health and interfere with relationships and normal daily activities. Too much stress can weaken your immune system and increase your risk for physical illness. If you already have a medical problem, stress can make it worse. What are the causes? All sorts of life events may cause stress. An event that causes stress for one person may not be stressful for another person. Major life events commonly cause stress. These may be positive or negative. Examples include losing your job, moving into a new home, getting married, having a baby, or losing a loved one. Less obvious life events may also cause stress, especially if they occur day after day or in combination. Examples include working long hours, driving in traffic, caring for children, being in debt, or being in a difficult relationship. What are the signs or symptoms? Stress may cause emotional  symptoms including, the following:  Anxiety. This is feeling worried, afraid, on edge, overwhelmed, or out of control.  Anger. This is feeling irritated or impatient.  Depression. This is feeling sad, down, helpless, or guilty.  Difficulty focusing, remembering, or making decisions.  Stress may cause physical symptoms, including the following:  Aches and pains. These may affect your head, neck, back, stomach, or other areas of your body.  Tight muscles or clenched jaw.  Low energy or trouble sleeping.  Stress may cause unhealthy behaviors, including the following:  Eating to feel better (overeating) or skipping meals.  Sleeping too little, too much, or both.  Working too much or putting off tasks (procrastination).  Smoking, drinking alcohol, or using drugs to feel better.  How is this diagnosed? Stress is diagnosed through an assessment by your health care provider. Your health care provider will ask questions about your symptoms and any stressful life events.Your health care provider will also ask about your medical history and may order blood tests or other tests. Certain medical conditions and medicine can cause physical symptoms similar to stress. Mental illness can cause emotional symptoms and unhealthy behaviors similar to stress. Your health care provider may refer you to a mental health professional for further evaluation. How is this treated? Stress management is the recommended treatment for stress.The goals of stress management are reducing stressful life events and coping with stress in healthy ways. Techniques for reducing stressful life events include the following:  Stress identification. Self-monitor for stress and identify what causes stress for you.  These skills may help you to avoid some stressful events.  Time management. Set your priorities, keep a calendar of events, and learn to say "no." These tools can help you avoid making too many  commitments.  Techniques for coping with stress include the following:  Rethinking the problem. Try to think realistically about stressful events rather than ignoring them or overreacting. Try to find the positives in a stressful situation rather than focusing on the negatives.  Exercise. Physical exercise can release both physical and emotional tension. The key is to find a form of exercise you enjoy and do it regularly.  Relaxation techniques. These relax the body and mind. Examples include yoga, meditation, tai chi, biofeedback, deep breathing, progressive muscle relaxation, listening to music, being out in nature, journaling, and other hobbies. Again, the key is to find one or more that you enjoy and can do regularly.  Healthy lifestyle. Eat a balanced diet, get plenty of sleep, and do not smoke. Avoid using alcohol or drugs to relax.  Strong support network. Spend time with family, friends, or other people you enjoy being around.Express your feelings and talk things over with someone you trust.  Counseling or talktherapy with a mental health professional may be helpful if you are having difficulty managing stress on your own. Medicine is typically not recommended for the treatment of stress.Talk to your health care provider if you think you need medicine for symptoms of stress. Follow these instructions at home:  Keep all follow-up visits as directed by your health care provider.  Take all medicines as directed by your health care provider. Contact a health care provider if:  Your symptoms get worse or you start having new symptoms.  You feel overwhelmed by your problems and can no longer manage them on your own. Get help right away if:  You feel like hurting yourself or someone else. This information is not intended to replace advice given to you by your health care provider. Make sure you discuss any questions you have with your health care provider. Document Released:  11/14/2000 Document Revised: 10/27/2015 Document Reviewed: 01/13/2013 Elsevier Interactive Patient Education  2017 Elsevier Inc.  Preventing High Cholesterol Cholesterol is a waxy, fat-like substance that your body needs in small amounts. Your liver makes all the cholesterol that your body needs. Having high cholesterol (hypercholesterolemia) increases your risk for heart disease and stroke. Extra (excess) cholesterol comes from the food you eat, such as animal-based fat (saturated fat) from meat and some dairy products. High cholesterol can often be prevented with diet and lifestyle changes. If you already have high cholesterol, you can control it with diet and lifestyle changes, as well as medicine. What nutrition changes can be made?  Eat less saturated fat. Foods that contain saturated fat include red meat and some dairy products.  Avoid processed meats, like bacon and lunch meats.  Avoid trans fats, which are found in margarine and some baked goods.  Avoid foods and beverages that have added sugars.  Eat more fruits, vegetables, and whole grains.  Choose healthy sources of protein, such as fish, poultry, and nuts.  Choose healthy sources of fat, such as: ? Nuts. ? Vegetable oils, especially olive oil. ? Fish that have healthy fats (omega-3 fatty acids), such as mackerel or salmon. What lifestyle changes can be made?  Lose weight if you are overweight. Losing 5-10 lb (2.3-4.5 kg) can help prevent or control high cholesterol and reduce your risk for diabetes and high blood pressure. Ask your  health care provider to help you with a diet and exercise plan to safely lose weight.  Get enough exercise. Do at least 150 minutes of moderate-intensity exercise each week. ? You could do this in short exercise sessions several times a day, or you could do longer exercise sessions a few times a week. For example, you could take a brisk 10-minute walk or bike ride, 3 times a day, for 5 days a  week.  Do not smoke. If you need help quitting, ask your health care provider.  Limit your alcohol intake. If you drink alcohol, limit alcohol intake to no more than 1 drink a day for nonpregnant women and 2 drinks a day for men. One drink equals 12 oz of beer, 5 oz of wine, or 1 oz of hard liquor. Why are these changes important? If you have high cholesterol, deposits (plaques) may build up on the walls of your blood vessels. Plaques make the arteries narrower and stiffer, which can restrict or block blood flow and cause blood clots to form. This greatly increases your risk for heart attack and stroke. Making diet and lifestyle changes can reduce your risk for these life-threatening conditions. What can I do to lower my risk?  Manage your risk factors for high cholesterol. Talk with your health care provider about all of your risk factors and how to lower your risk.  Manage other conditions that you have, such as diabetes or high blood pressure (hypertension).  Have your cholesterol checked at regular intervals.  Keep all follow-up visits as told by your health care provider. This is important. How is this treated? In addition to diet and lifestyle changes, your health care provider may recommend medicines to help lower cholesterol, such as a medicine to reduce the amount of cholesterol made in your liver. You may need medicine if:  Diet and lifestyle changes do not lower your cholesterol enough.  You have high cholesterol and other risk factors for heart disease or stroke.  Take over-the-counter and prescription medicines only as told by your health care provider. Where to find more information:  American Heart Association: ThisTune.com.pt.jsp  National Heart, Lung, and Blood Institute: FrenchToiletries.com.cy Summary  High cholesterol increases your risk for heart  disease and stroke. By keeping your cholesterol level low, you can reduce your risk for these conditions.  Diet and lifestyle changes are the most important steps in preventing high cholesterol.  Work with your health care provider to manage your risk factors, and have your blood tested regularly. This information is not intended to replace advice given to you by your health care provider. Make sure you discuss any questions you have with your health care provider. Document Released: 06/05/2015 Document Revised: 01/28/2016 Document Reviewed: 01/28/2016 Elsevier Interactive Patient Education  [employer].

## 2017-08-23 NOTE — Progress Notes (Signed)
I reviewed above note and agree with the assessment and plan. May also need to rule out occipital neuralgia next visit if HA continues. Thanks.   Rosalin Hawking, MD PhD Stroke Neurology 08/23/2017 11:36 AM

## 2017-10-15 DIAGNOSIS — E039 Hypothyroidism, unspecified: Secondary | ICD-10-CM | POA: Diagnosis not present

## 2017-10-15 DIAGNOSIS — E782 Mixed hyperlipidemia: Secondary | ICD-10-CM | POA: Diagnosis not present

## 2017-10-17 DIAGNOSIS — M5080 Other cervical disc disorders, unspecified cervical region: Secondary | ICD-10-CM | POA: Diagnosis not present

## 2017-10-17 DIAGNOSIS — M79669 Pain in unspecified lower leg: Secondary | ICD-10-CM | POA: Diagnosis not present

## 2017-10-17 DIAGNOSIS — E782 Mixed hyperlipidemia: Secondary | ICD-10-CM | POA: Diagnosis not present

## 2017-10-17 DIAGNOSIS — F331 Major depressive disorder, recurrent, moderate: Secondary | ICD-10-CM | POA: Diagnosis not present

## 2017-10-17 DIAGNOSIS — E785 Hyperlipidemia, unspecified: Secondary | ICD-10-CM | POA: Diagnosis not present

## 2017-10-17 DIAGNOSIS — E039 Hypothyroidism, unspecified: Secondary | ICD-10-CM | POA: Diagnosis not present

## 2017-10-17 DIAGNOSIS — Z6836 Body mass index (BMI) 36.0-36.9, adult: Secondary | ICD-10-CM | POA: Diagnosis not present

## 2017-10-17 DIAGNOSIS — I10 Essential (primary) hypertension: Secondary | ICD-10-CM | POA: Diagnosis not present

## 2017-11-21 DIAGNOSIS — E871 Hypo-osmolality and hyponatremia: Secondary | ICD-10-CM | POA: Diagnosis not present

## 2017-11-21 DIAGNOSIS — E782 Mixed hyperlipidemia: Secondary | ICD-10-CM | POA: Diagnosis not present

## 2017-11-21 DIAGNOSIS — R51 Headache: Secondary | ICD-10-CM | POA: Diagnosis not present

## 2017-11-21 DIAGNOSIS — I6529 Occlusion and stenosis of unspecified carotid artery: Secondary | ICD-10-CM | POA: Diagnosis not present

## 2017-11-21 DIAGNOSIS — M79669 Pain in unspecified lower leg: Secondary | ICD-10-CM | POA: Diagnosis not present

## 2017-11-21 DIAGNOSIS — H9319 Tinnitus, unspecified ear: Secondary | ICD-10-CM | POA: Diagnosis not present

## 2017-11-21 DIAGNOSIS — Z6836 Body mass index (BMI) 36.0-36.9, adult: Secondary | ICD-10-CM | POA: Diagnosis not present

## 2017-11-21 DIAGNOSIS — E039 Hypothyroidism, unspecified: Secondary | ICD-10-CM | POA: Diagnosis not present

## 2017-11-21 DIAGNOSIS — M25569 Pain in unspecified knee: Secondary | ICD-10-CM | POA: Diagnosis not present

## 2017-11-21 DIAGNOSIS — R4701 Aphasia: Secondary | ICD-10-CM | POA: Diagnosis not present

## 2017-11-21 DIAGNOSIS — M19049 Primary osteoarthritis, unspecified hand: Secondary | ICD-10-CM | POA: Diagnosis not present

## 2017-11-21 DIAGNOSIS — I10 Essential (primary) hypertension: Secondary | ICD-10-CM | POA: Diagnosis not present

## 2017-12-04 DIAGNOSIS — I6529 Occlusion and stenosis of unspecified carotid artery: Secondary | ICD-10-CM | POA: Diagnosis not present

## 2017-12-04 DIAGNOSIS — E782 Mixed hyperlipidemia: Secondary | ICD-10-CM | POA: Diagnosis not present

## 2017-12-04 DIAGNOSIS — Z6836 Body mass index (BMI) 36.0-36.9, adult: Secondary | ICD-10-CM | POA: Diagnosis not present

## 2017-12-04 DIAGNOSIS — E039 Hypothyroidism, unspecified: Secondary | ICD-10-CM | POA: Diagnosis not present

## 2017-12-04 DIAGNOSIS — H9319 Tinnitus, unspecified ear: Secondary | ICD-10-CM | POA: Diagnosis not present

## 2017-12-04 DIAGNOSIS — M25569 Pain in unspecified knee: Secondary | ICD-10-CM | POA: Diagnosis not present

## 2017-12-04 DIAGNOSIS — E871 Hypo-osmolality and hyponatremia: Secondary | ICD-10-CM | POA: Diagnosis not present

## 2017-12-04 DIAGNOSIS — R4701 Aphasia: Secondary | ICD-10-CM | POA: Diagnosis not present

## 2017-12-04 DIAGNOSIS — I10 Essential (primary) hypertension: Secondary | ICD-10-CM | POA: Diagnosis not present

## 2017-12-04 DIAGNOSIS — M79669 Pain in unspecified lower leg: Secondary | ICD-10-CM | POA: Diagnosis not present

## 2017-12-04 DIAGNOSIS — R51 Headache: Secondary | ICD-10-CM | POA: Diagnosis not present

## 2017-12-04 DIAGNOSIS — M19049 Primary osteoarthritis, unspecified hand: Secondary | ICD-10-CM | POA: Diagnosis not present

## 2017-12-16 DIAGNOSIS — F331 Major depressive disorder, recurrent, moderate: Secondary | ICD-10-CM | POA: Diagnosis not present

## 2017-12-16 DIAGNOSIS — M19049 Primary osteoarthritis, unspecified hand: Secondary | ICD-10-CM | POA: Diagnosis not present

## 2017-12-16 DIAGNOSIS — I10 Essential (primary) hypertension: Secondary | ICD-10-CM | POA: Diagnosis not present

## 2017-12-16 DIAGNOSIS — E039 Hypothyroidism, unspecified: Secondary | ICD-10-CM | POA: Diagnosis not present

## 2017-12-16 DIAGNOSIS — I6529 Occlusion and stenosis of unspecified carotid artery: Secondary | ICD-10-CM | POA: Diagnosis not present

## 2017-12-16 DIAGNOSIS — E782 Mixed hyperlipidemia: Secondary | ICD-10-CM | POA: Diagnosis not present

## 2018-01-08 DIAGNOSIS — E782 Mixed hyperlipidemia: Secondary | ICD-10-CM | POA: Diagnosis not present

## 2018-01-08 DIAGNOSIS — H9319 Tinnitus, unspecified ear: Secondary | ICD-10-CM | POA: Diagnosis not present

## 2018-01-08 DIAGNOSIS — E871 Hypo-osmolality and hyponatremia: Secondary | ICD-10-CM | POA: Diagnosis not present

## 2018-01-08 DIAGNOSIS — M79669 Pain in unspecified lower leg: Secondary | ICD-10-CM | POA: Diagnosis not present

## 2018-01-08 DIAGNOSIS — Z6835 Body mass index (BMI) 35.0-35.9, adult: Secondary | ICD-10-CM | POA: Diagnosis not present

## 2018-01-08 DIAGNOSIS — R51 Headache: Secondary | ICD-10-CM | POA: Diagnosis not present

## 2018-01-08 DIAGNOSIS — M25569 Pain in unspecified knee: Secondary | ICD-10-CM | POA: Diagnosis not present

## 2018-01-08 DIAGNOSIS — E039 Hypothyroidism, unspecified: Secondary | ICD-10-CM | POA: Diagnosis not present

## 2018-01-08 DIAGNOSIS — I10 Essential (primary) hypertension: Secondary | ICD-10-CM | POA: Diagnosis not present

## 2018-01-08 DIAGNOSIS — I6529 Occlusion and stenosis of unspecified carotid artery: Secondary | ICD-10-CM | POA: Diagnosis not present

## 2018-01-08 DIAGNOSIS — R4701 Aphasia: Secondary | ICD-10-CM | POA: Diagnosis not present

## 2018-01-08 DIAGNOSIS — M19049 Primary osteoarthritis, unspecified hand: Secondary | ICD-10-CM | POA: Diagnosis not present

## 2018-01-16 DIAGNOSIS — E039 Hypothyroidism, unspecified: Secondary | ICD-10-CM | POA: Diagnosis not present

## 2018-01-16 DIAGNOSIS — M19049 Primary osteoarthritis, unspecified hand: Secondary | ICD-10-CM | POA: Diagnosis not present

## 2018-01-16 DIAGNOSIS — I10 Essential (primary) hypertension: Secondary | ICD-10-CM | POA: Diagnosis not present

## 2018-01-16 DIAGNOSIS — E782 Mixed hyperlipidemia: Secondary | ICD-10-CM | POA: Diagnosis not present

## 2018-01-16 DIAGNOSIS — E785 Hyperlipidemia, unspecified: Secondary | ICD-10-CM | POA: Diagnosis not present

## 2018-01-16 DIAGNOSIS — E871 Hypo-osmolality and hyponatremia: Secondary | ICD-10-CM | POA: Diagnosis not present

## 2018-01-16 DIAGNOSIS — F331 Major depressive disorder, recurrent, moderate: Secondary | ICD-10-CM | POA: Diagnosis not present

## 2018-01-16 DIAGNOSIS — I6529 Occlusion and stenosis of unspecified carotid artery: Secondary | ICD-10-CM | POA: Diagnosis not present

## 2018-01-23 DIAGNOSIS — R944 Abnormal results of kidney function studies: Secondary | ICD-10-CM | POA: Diagnosis not present

## 2018-01-23 DIAGNOSIS — Z6835 Body mass index (BMI) 35.0-35.9, adult: Secondary | ICD-10-CM | POA: Diagnosis not present

## 2018-01-23 DIAGNOSIS — I10 Essential (primary) hypertension: Secondary | ICD-10-CM | POA: Diagnosis not present

## 2018-02-12 DIAGNOSIS — E039 Hypothyroidism, unspecified: Secondary | ICD-10-CM | POA: Diagnosis not present

## 2018-02-12 DIAGNOSIS — M19049 Primary osteoarthritis, unspecified hand: Secondary | ICD-10-CM | POA: Diagnosis not present

## 2018-02-12 DIAGNOSIS — E785 Hyperlipidemia, unspecified: Secondary | ICD-10-CM | POA: Diagnosis not present

## 2018-02-12 DIAGNOSIS — R944 Abnormal results of kidney function studies: Secondary | ICD-10-CM | POA: Diagnosis not present

## 2018-02-12 DIAGNOSIS — E782 Mixed hyperlipidemia: Secondary | ICD-10-CM | POA: Diagnosis not present

## 2018-02-12 DIAGNOSIS — I10 Essential (primary) hypertension: Secondary | ICD-10-CM | POA: Diagnosis not present

## 2018-02-12 DIAGNOSIS — F331 Major depressive disorder, recurrent, moderate: Secondary | ICD-10-CM | POA: Diagnosis not present

## 2018-02-20 ENCOUNTER — Encounter: Payer: Self-pay | Admitting: Cardiology

## 2018-02-20 ENCOUNTER — Ambulatory Visit: Payer: PPO | Admitting: Cardiology

## 2018-02-20 VITALS — BP 128/80 | HR 83 | Ht 60.0 in | Wt 174.8 lb

## 2018-02-20 DIAGNOSIS — R42 Dizziness and giddiness: Secondary | ICD-10-CM | POA: Diagnosis not present

## 2018-02-20 DIAGNOSIS — I10 Essential (primary) hypertension: Secondary | ICD-10-CM

## 2018-02-20 NOTE — Addendum Note (Signed)
Addended by: Debbora Lacrosse R on: 02/20/2018 01:57 PM   Modules accepted: Orders

## 2018-02-20 NOTE — Patient Instructions (Signed)
Medication Instructions:  DECREASE HCTZ TO 12.5 MG  EVERY OTHER DAY  Labwork: NONE  Testing/Procedures: NONE  Follow-Up: Your physician recommends that you schedule a follow-up appointment in: 8 WEEKS    Any Other Special Instructions Will Be Listed Below (If Applicable).     If you need a refill on your cardiac medications before your next appointment, please call your pharmacy.

## 2018-02-20 NOTE — Progress Notes (Signed)
Clinical Summary [name] is a 72 y.o.female seen as new consult, referred by Dr Nevada Crane for HTN  1. HTN - reactions to amlodopine in the past - notes mention some issues with high K recently, have been cautious with losartan dosing.   - checks at home. In AM 110s-130s/60-70s. In evening 120-130/60-70s. Her previous bp spikes have resolved.  - can have some orthostatic symptoms.  - water 16 oz x 8 bottles, diet pepsi 16 oz x 5, occasional tea. - has clonidine prn but reprots significant side effects when taking.     Past Medical History:  Diagnosis Date  . Arthritis   . Complication of anesthesia    "pt. holds her breath when waking up"  . GERD (gastroesophageal reflux disease)   . Hypertension   . Hypothyroidism   . PONV (postoperative nausea and vomiting)   . Staph infection    abdomen and returned 5 yrs. later     Allergies  Allergen Reactions  . Sulfamethoxazole Swelling    Lips swell and feel numb  . Sulfonamide Derivatives      Current Outpatient Medications  Medication Sig Dispense Refill  . amLODipine (NORVASC) 5 MG tablet Take 1 tablet (5 mg total) by mouth daily. 30 tablet 0  . aspirin EC 325 MG tablet Take 1 tablet (325 mg total) by mouth daily. 30 tablet 0  . atorvastatin (LIPITOR) 40 MG tablet Take 1 tablet (40 mg total) by mouth daily. 90 tablet 3  . Biotin w/ Vitamins C & E (HAIR/SKIN/NAILS) 1250-7.5-7.5 MCG-MG-UNT CHEW Chew 2 each by mouth daily.    . Calcium Carbonate-Vit D-Min (CALCIUM 600+D PLUS MINERALS) 600-400 MG-UNIT CHEW Chew 1 tablet by mouth daily.    . diclofenac (VOLTAREN) 75 MG EC tablet Take 75 mg by mouth 2 (two) times daily.    Marland Kitchen docusate sodium (COLACE) 100 MG capsule Take 1 capsule (100 mg total) by mouth 2 (two) times daily. To prevent constipation while taking pain medication. (Patient taking differently: Take 300 mg by mouth at bedtime. To prevent constipation while taking pain medication.) 60 capsule 0  . DULoxetine (CYMBALTA)  30 MG capsule Take 30 mg by mouth daily.    Marland Kitchen levothyroxine (SYNTHROID, LEVOTHROID) 75 MCG tablet Take 75 mcg by mouth daily before breakfast.    . milk thistle 175 MG tablet Take 250 mg by mouth daily.     . nebivolol (BYSTOLIC) 10 MG tablet Take 10 mg daily by mouth.    Marland Kitchen omeprazole (PRILOSEC) 20 MG capsule Take 1 capsule (20 mg total) by mouth daily. While taking anti inflammatory medicine daily 30 capsule 2  . ondansetron (ZOFRAN) 4 MG tablet Take 1 tablet (4 mg total) by mouth every 8 (eight) hours as needed for nausea or vomiting. 40 tablet 0  . Turmeric 500 MG CAPS Take 1,000 mg by mouth 2 (two) times daily.     No current facility-administered medications for this visit.      Past Surgical History:  Procedure Laterality Date  . ABDOMINAL HYSTERECTOMY    . CERVICAL FUSION  1990's  . COLON SURGERY    . FOOT SURGERY Left    reshaped  . HERNIA REPAIR    . JOINT REPLACEMENT Right    hip  . LUMBAR SPINE SURGERY    . REPLACEMENT TOTAL KNEE Right   . THYROID SURGERY    . TOTAL KNEE ARTHROPLASTY Left 08/21/2016   Procedure: LEFT TOTAL KNEE ARTHROPLASTY;  Surgeon: Renette Butters,  MD;  Location: [facility];  Service: Orthopedics;  Laterality: Left;     Allergies  Allergen Reactions  . Sulfamethoxazole Swelling    Lips swell and feel numb  . Sulfonamide Derivatives       No family history on file.   Social History [name] reports that she has never smoked. She has never used smokeless tobacco. [name] reports that she does not drink alcohol.   Review of Systems CONSTITUTIONAL: No weight loss, fever, chills, weakness or fatigue.  HEENT: Eyes: No visual loss, blurred vision, double vision or yellow sclerae.No hearing loss, sneezing, congestion, runny nose or sore throat.  SKIN: No rash or itching.  CARDIOVASCULAR: per hpi RESPIRATORY: No shortness of breath, cough or sputum.  GASTROINTESTINAL: No anorexia, nausea, vomiting or diarrhea. No abdominal pain or blood.    GENITOURINARY: No burning on urination, no polyuria NEUROLOGICAL: +dizziness MUSCULOSKELETAL: No muscle, back pain, joint pain or stiffness.  LYMPHATICS: No enlarged nodes. No history of splenectomy.  PSYCHIATRIC: No history of depression or anxiety.  ENDOCRINOLOGIC: No reports of sweating, cold or heat intolerance. No polyuria or polydipsia.  Marland Kitchen   Physical Examination  Gen: resting comfortably, no acute distress HEENT: no scleral icterus, pupils equal round and reactive, no palptable cervical adenopathy,  CV: RRR, no m/r/g, no jvd Resp: Clear to auscultation bilaterally GI: abdomen is soft, non-tender, non-distended, normal bowel sounds, no hepatosplenomegaly MSK: extremities are warm, no edema.  Skin: warm, no rash Neuro:  no focal deficits Psych: appropriate affect   Diagnostic Studies  03/2017 echo Study Conclusions  - Left ventricle: The cavity size was normal. Systolic function was   normal. The estimated ejection fraction was in the range of 55%   to 60%. Wall motion was normal; there were no regional wall   motion abnormalities. Doppler parameters are consistent with   abnormal left ventricular relaxation (grade 1 diastolic   dysfunction). - Mitral valve: There was moderate regurgitation. - Atrial septum: No defect or patent foramen ovale was identified. - Tricuspid valve: There was mild regurgitation. - Pulmonic valve: There was mild regurgitation. - Pulmonary arteries: PA peak pressure: 41 mm Hg (S). - Systemic veins: The IVC was suboptimally visualized, but appeared   dilated. There was normal respiratory variation. Estimated RAP 8   mmHg.   Assessment and Plan   1. HTN - recently labile HTN, current numbers are steady and at goal. Reports some orthostatic symptoms. She has adequate oral hydration, likely related to her bp pills. We will change HCTZ to every other day. Would accept high bp range for her given her orthostatic symptoms. She is taking losartan  50mg  bid which was not on her original list but will be added.  - request pcp labs, follow up on K levels which apparently have been an issue.   EKG today shows SR, LAFB, PACs.  F/u 8 weeks.   Arnoldo Lenis, M.D.

## 2018-02-24 ENCOUNTER — Ambulatory Visit: Payer: PPO | Admitting: Adult Health

## 2018-03-26 DIAGNOSIS — F331 Major depressive disorder, recurrent, moderate: Secondary | ICD-10-CM | POA: Diagnosis not present

## 2018-03-26 DIAGNOSIS — I10 Essential (primary) hypertension: Secondary | ICD-10-CM | POA: Diagnosis not present

## 2018-03-26 DIAGNOSIS — M19049 Primary osteoarthritis, unspecified hand: Secondary | ICD-10-CM | POA: Diagnosis not present

## 2018-03-26 DIAGNOSIS — E039 Hypothyroidism, unspecified: Secondary | ICD-10-CM | POA: Diagnosis not present

## 2018-03-26 DIAGNOSIS — R944 Abnormal results of kidney function studies: Secondary | ICD-10-CM | POA: Diagnosis not present

## 2018-03-26 DIAGNOSIS — E782 Mixed hyperlipidemia: Secondary | ICD-10-CM | POA: Diagnosis not present

## 2018-03-27 ENCOUNTER — Telehealth: Payer: Self-pay | Admitting: Adult Health

## 2018-03-27 NOTE — Telephone Encounter (Signed)
Lori from the pharmacy called and stated that she needs [name]'s NPI to fill the medication (atorvastatin). Please call and advise.

## 2018-03-27 NOTE — Telephone Encounter (Signed)
Rn spoke with [facility] and [facility] in [place].Pt change pharmacies. Rn gave NPI number.

## 2018-03-27 NOTE — Telephone Encounter (Signed)
Noted! Thank you

## 2018-04-17 ENCOUNTER — Encounter: Payer: Self-pay | Admitting: Cardiology

## 2018-04-17 ENCOUNTER — Ambulatory Visit: Payer: PPO | Admitting: Cardiology

## 2018-04-17 VITALS — BP 108/60 | HR 81 | Ht 59.5 in | Wt 171.0 lb

## 2018-04-17 DIAGNOSIS — I10 Essential (primary) hypertension: Secondary | ICD-10-CM | POA: Diagnosis not present

## 2018-04-17 NOTE — Progress Notes (Signed)
Clinical Summary Ms. Gilroy is a 72 y.o.female seen today for follow up of the following medical problems.   1. HTN - reactions to amlodopine in the past - notes mention some issues with high K recently, have been cautious with losartan dosing. Currnetly on 50mg  bid - previous issues with bp spikes at home, she had been checking her bp's very frequently.  - last visit we changed HCTZ to every other day due to orthostatic symptoms - home bp's 110-130/60s-70s.  - dizziness has much improved with change in HCTZ dosing.   - labs upcoming next week with pcp  Past Medical History:  Diagnosis Date  . Arthritis   . Complication of anesthesia    "pt. holds her breath when waking up"  . GERD (gastroesophageal reflux disease)   . Hypertension   . Hypothyroidism   . PONV (postoperative nausea and vomiting)   . Staph infection    abdomen and returned 5 yrs. later     Allergies  Allergen Reactions  . Sulfamethoxazole Swelling    Lips swell and feel numb  . Sulfonamide Derivatives      Current Outpatient Medications  Medication Sig Dispense Refill  . atorvastatin (LIPITOR) 40 MG tablet Take 1 tablet (40 mg total) by mouth daily. 90 tablet 3  . docusate sodium (COLACE) 100 MG capsule Take 1 capsule (100 mg total) by mouth 2 (two) times daily. To prevent constipation while taking pain medication. (Patient taking differently: Take 300 mg by mouth at bedtime. To prevent constipation while taking pain medication.) 60 capsule 0  . DULoxetine (CYMBALTA) 30 MG capsule Take 30 mg by mouth daily.    . hydrochlorothiazide (MICROZIDE) 12.5 MG capsule Take 12.5 mg by mouth every other day.    . levothyroxine (SYNTHROID, LEVOTHROID) 75 MCG tablet Take 75 mcg by mouth daily before breakfast.    . losartan (COZAAR) 50 MG tablet Take 50 mg by mouth 2 (two) times daily.    . nebivolol (BYSTOLIC) 10 MG tablet Take 10 mg daily by mouth.    Marland Kitchen omeprazole (PRILOSEC) 20 MG capsule Take 1 capsule (20  mg total) by mouth daily. While taking anti inflammatory medicine daily 30 capsule 2   No current facility-administered medications for this visit.      Past Surgical History:  Procedure Laterality Date  . ABDOMINAL HYSTERECTOMY    . CERVICAL FUSION  1990's  . COLON SURGERY    . FOOT SURGERY Left    reshaped  . HERNIA REPAIR    . JOINT REPLACEMENT Right    hip  . LUMBAR SPINE SURGERY    . REPLACEMENT TOTAL KNEE Right   . THYROID SURGERY    . TOTAL KNEE ARTHROPLASTY Left 08/21/2016   Procedure: LEFT TOTAL KNEE ARTHROPLASTY;  Surgeon: Renette Butters, MD;  Location: [facility];  Service: Orthopedics;  Laterality: Left;     Allergies  Allergen Reactions  . Sulfamethoxazole Swelling    Lips swell and feel numb  . Sulfonamide Derivatives       No family history on file.   Social History Ms. Ferger reports that she has never smoked. She has never used smokeless tobacco. Ms. Wickey reports that she does not drink alcohol.   Review of Systems CONSTITUTIONAL: No weight loss, fever, chills, weakness or fatigue.  HEENT: Eyes: No visual loss, blurred vision, double vision or yellow sclerae.No hearing loss, sneezing, congestion, runny nose or sore throat.  SKIN: No rash or itching.  CARDIOVASCULAR: per  hpi RESPIRATORY: No shortness of breath, cough or sputum.  GASTROINTESTINAL: No anorexia, nausea, vomiting or diarrhea. No abdominal pain or blood.  GENITOURINARY: No burning on urination, no polyuria NEUROLOGICAL: No headache, dizziness, syncope, paralysis, ataxia, numbness or tingling in the extremities. No change in bowel or bladder control.  MUSCULOSKELETAL: No muscle, back pain, joint pain or stiffness.  LYMPHATICS: No enlarged nodes. No history of splenectomy.  PSYCHIATRIC: No history of depression or anxiety.  ENDOCRINOLOGIC: No reports of sweating, cold or heat intolerance. No polyuria or polydipsia.  Marland Kitchen   Physical Examination Vitals:   04/17/18 0959  BP: 108/60    Pulse: 81  SpO2: 98%   Vitals:   04/17/18 0959  Weight: 171 lb (77.6 kg)  Height: 4' 11.5" (1.511 m)    Gen: resting comfortably, no acute distress HEENT: no scleral icterus, pupils equal round and reactive, no palptable cervical adenopathy,  CV: RRR, no mr/g, no jvd Resp: Clear to auscultation bilaterally GI: abdomen is soft, non-tender, non-distended, normal bowel sounds, no hepatosplenomegaly MSK: extremities are warm, no edema.  Skin: warm, no rash Neuro:  no focal deficits Psych: appropriate affect   Diagnostic Studies  03/2017 echo Study Conclusions  - Left ventricle: The cavity size was normal. Systolic function was normal. The estimated ejection fraction was in the range of 55% to 60%. Wall motion was normal; there were no regional wall motion abnormalities. Doppler parameters are consistent with abnormal left ventricular relaxation (grade 1 diastolic dysfunction). - Mitral valve: There was moderate regurgitation. - Atrial septum: No defect or patent foramen ovale was identified. - Tricuspid valve: There was mild regurgitation. - Pulmonic valve: There was mild regurgitation. - Pulmonary arteries: PA peak pressure: 41 mm Hg (S). - Systemic veins: The IVC was suboptimally visualized, but appeared dilated. There was normal respiratory variation. Estimated RAP 8 mmHg.    Assessment and Plan    1. HTN - previous issues with labile bp's, has done well recently - orthostatic symptoms improved with HCTZ every other day - overall bp at goal. F/u labs from pcp next week, prior issues with K and Na. If ongoing would need to adjust her regimen further   F/u 6 months  Arnoldo Lenis, M.D.

## 2018-04-17 NOTE — Patient Instructions (Signed)
Medication Instructions:  Your physician recommends that you continue on your current medications as directed. Please refer to the Current Medication list given to you today.  If you need a refill on your cardiac medications before your next appointment, please call your pharmacy.   Lab work: none If you have labs (blood work) drawn today and your tests are completely normal, you will receive your results only by: Marland Kitchen MyChart Message (if you have MyChart) OR . A paper copy in the mail If you have any lab test that is abnormal or we need to change your treatment, we will call you to review the results.  Testing/Procedures: none  Follow-Up: At [facility], you and your health needs are our priority.  As part of our continuing mission to provide you with exceptional heart care, we have created designated Provider Care Teams.  These Care Teams include your primary Cardiologist (physician) and Advanced Practice Providers (APPs -  Physician Assistants and Nurse Practitioners) who all work together to provide you with the care you need, when you need it. You will need a follow up appointment in 6 months.  Please call our office 2 months in advance to schedule this appointment.  You may see Dr Harl Bowie or one of the following Advanced Practice Providers on your designated Care Team:   Bernerd Pho, PA-C [facility]) . Ermalinda Barrios, PA-C ([place])  Any Other Special Instructions Will Be Listed Below (If Applicable). NONE

## 2018-04-23 DIAGNOSIS — E039 Hypothyroidism, unspecified: Secondary | ICD-10-CM | POA: Diagnosis not present

## 2018-04-23 DIAGNOSIS — I10 Essential (primary) hypertension: Secondary | ICD-10-CM | POA: Diagnosis not present

## 2018-04-23 DIAGNOSIS — E785 Hyperlipidemia, unspecified: Secondary | ICD-10-CM | POA: Diagnosis not present

## 2018-04-23 DIAGNOSIS — E871 Hypo-osmolality and hyponatremia: Secondary | ICD-10-CM | POA: Diagnosis not present

## 2018-04-23 DIAGNOSIS — R944 Abnormal results of kidney function studies: Secondary | ICD-10-CM | POA: Diagnosis not present

## 2018-04-23 DIAGNOSIS — E782 Mixed hyperlipidemia: Secondary | ICD-10-CM | POA: Diagnosis not present

## 2018-04-25 DIAGNOSIS — E039 Hypothyroidism, unspecified: Secondary | ICD-10-CM | POA: Diagnosis not present

## 2018-04-25 DIAGNOSIS — I10 Essential (primary) hypertension: Secondary | ICD-10-CM | POA: Diagnosis not present

## 2018-04-25 DIAGNOSIS — E782 Mixed hyperlipidemia: Secondary | ICD-10-CM | POA: Diagnosis not present

## 2018-04-25 DIAGNOSIS — F331 Major depressive disorder, recurrent, moderate: Secondary | ICD-10-CM | POA: Diagnosis not present

## 2018-04-25 DIAGNOSIS — E785 Hyperlipidemia, unspecified: Secondary | ICD-10-CM | POA: Diagnosis not present

## 2018-04-29 DIAGNOSIS — E782 Mixed hyperlipidemia: Secondary | ICD-10-CM | POA: Diagnosis not present

## 2018-04-29 DIAGNOSIS — E871 Hypo-osmolality and hyponatremia: Secondary | ICD-10-CM | POA: Diagnosis not present

## 2018-04-29 DIAGNOSIS — I10 Essential (primary) hypertension: Secondary | ICD-10-CM | POA: Diagnosis not present

## 2018-04-29 DIAGNOSIS — K219 Gastro-esophageal reflux disease without esophagitis: Secondary | ICD-10-CM | POA: Diagnosis not present

## 2018-04-29 DIAGNOSIS — E039 Hypothyroidism, unspecified: Secondary | ICD-10-CM | POA: Diagnosis not present

## 2018-04-29 DIAGNOSIS — F331 Major depressive disorder, recurrent, moderate: Secondary | ICD-10-CM | POA: Diagnosis not present

## 2018-04-29 DIAGNOSIS — Z23 Encounter for immunization: Secondary | ICD-10-CM | POA: Diagnosis not present

## 2018-04-29 DIAGNOSIS — Z0000 Encounter for general adult medical examination without abnormal findings: Secondary | ICD-10-CM | POA: Diagnosis not present

## 2018-05-12 DIAGNOSIS — I10 Essential (primary) hypertension: Secondary | ICD-10-CM | POA: Diagnosis not present

## 2018-05-12 DIAGNOSIS — E782 Mixed hyperlipidemia: Secondary | ICD-10-CM | POA: Diagnosis not present

## 2018-05-15 ENCOUNTER — Other Ambulatory Visit (HOSPITAL_COMMUNITY): Payer: Self-pay | Admitting: Internal Medicine

## 2018-05-15 DIAGNOSIS — Z1231 Encounter for screening mammogram for malignant neoplasm of breast: Secondary | ICD-10-CM

## 2018-06-12 ENCOUNTER — Ambulatory Visit (HOSPITAL_COMMUNITY)
Admission: RE | Admit: 2018-06-12 | Discharge: 2018-06-12 | Disposition: A | Discharge status: Home / Self Care | Payer: PPO | Source: Ambulatory Visit | Attending: Internal Medicine | Admitting: Internal Medicine

## 2018-06-12 DIAGNOSIS — Z1231 Encounter for screening mammogram for malignant neoplasm of breast: Secondary | ICD-10-CM | POA: Insufficient documentation

## 2018-06-24 DIAGNOSIS — E782 Mixed hyperlipidemia: Secondary | ICD-10-CM | POA: Diagnosis not present

## 2018-06-24 DIAGNOSIS — I10 Essential (primary) hypertension: Secondary | ICD-10-CM | POA: Diagnosis not present

## 2018-07-22 DIAGNOSIS — K219 Gastro-esophageal reflux disease without esophagitis: Secondary | ICD-10-CM | POA: Diagnosis not present

## 2018-07-22 DIAGNOSIS — E782 Mixed hyperlipidemia: Secondary | ICD-10-CM | POA: Diagnosis not present

## 2018-07-22 DIAGNOSIS — I10 Essential (primary) hypertension: Secondary | ICD-10-CM | POA: Diagnosis not present

## 2018-07-22 DIAGNOSIS — E039 Hypothyroidism, unspecified: Secondary | ICD-10-CM | POA: Diagnosis not present

## 2018-07-22 DIAGNOSIS — F331 Major depressive disorder, recurrent, moderate: Secondary | ICD-10-CM | POA: Diagnosis not present

## 2018-07-22 DIAGNOSIS — E785 Hyperlipidemia, unspecified: Secondary | ICD-10-CM | POA: Diagnosis not present

## 2018-08-11 IMAGING — DX DG KNEE 1-2V PORT*L*
2 series · 2 of 2 positions shown · non-contrast
Comparison: None.

CLINICAL DATA: Status post left knee replacement.

EXAM:
PORTABLE LEFT KNEE - 1-2 VIEW

[knee ap]
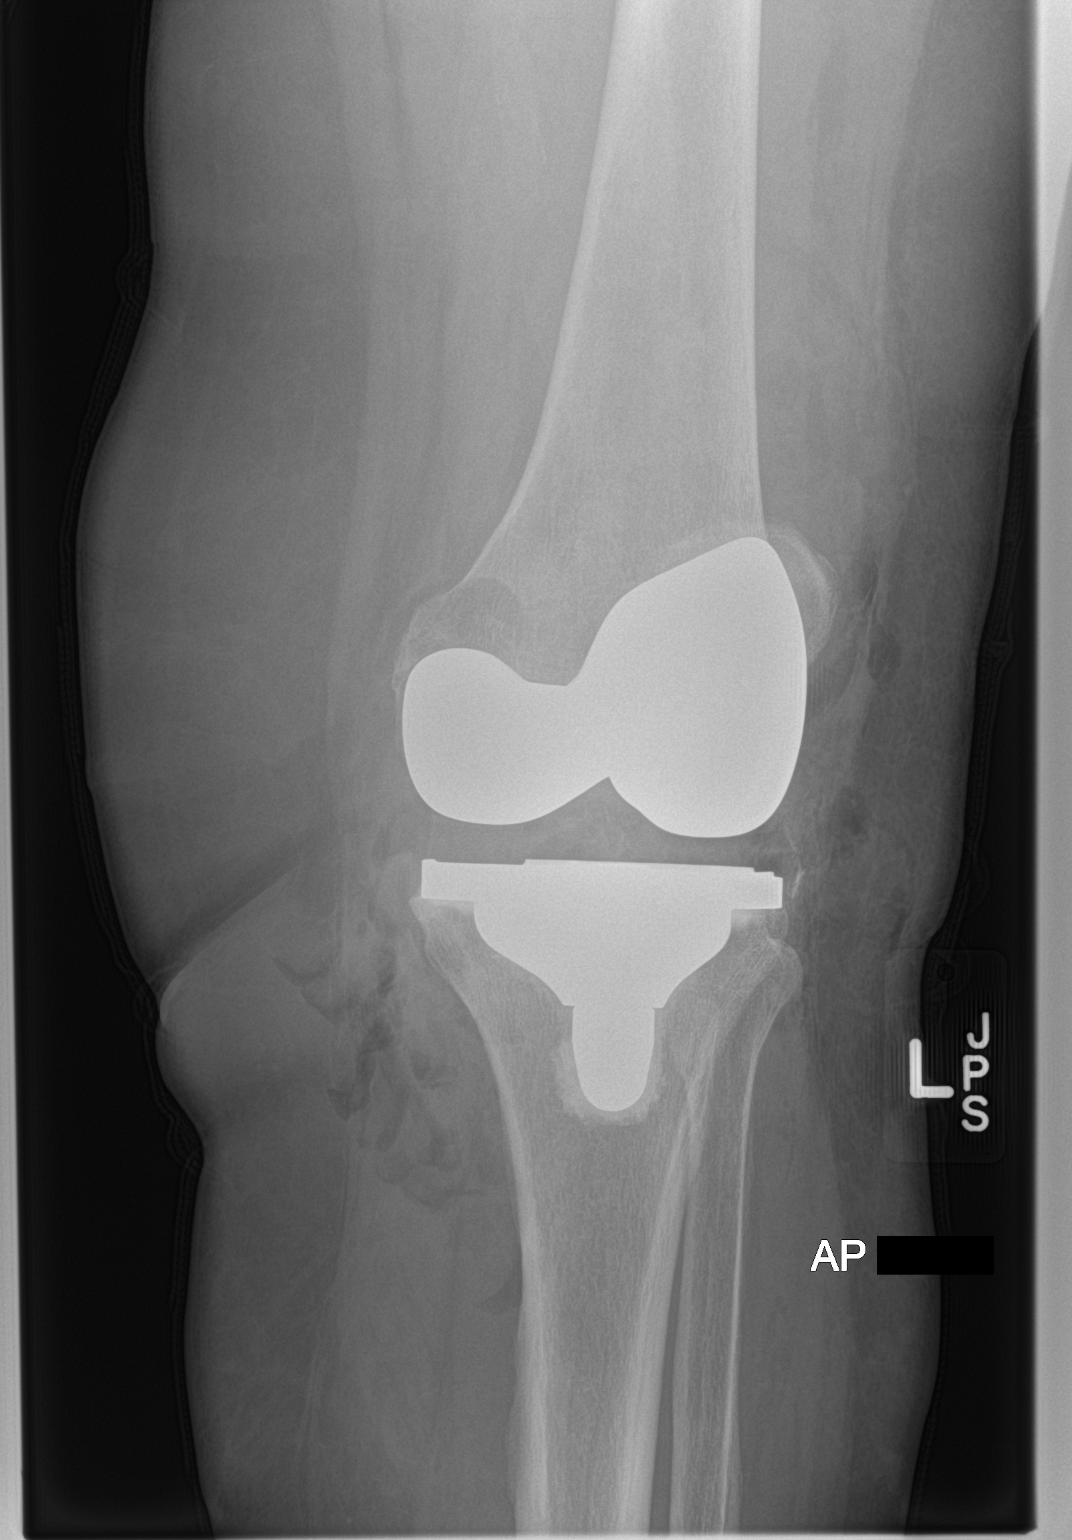

[knee lat]
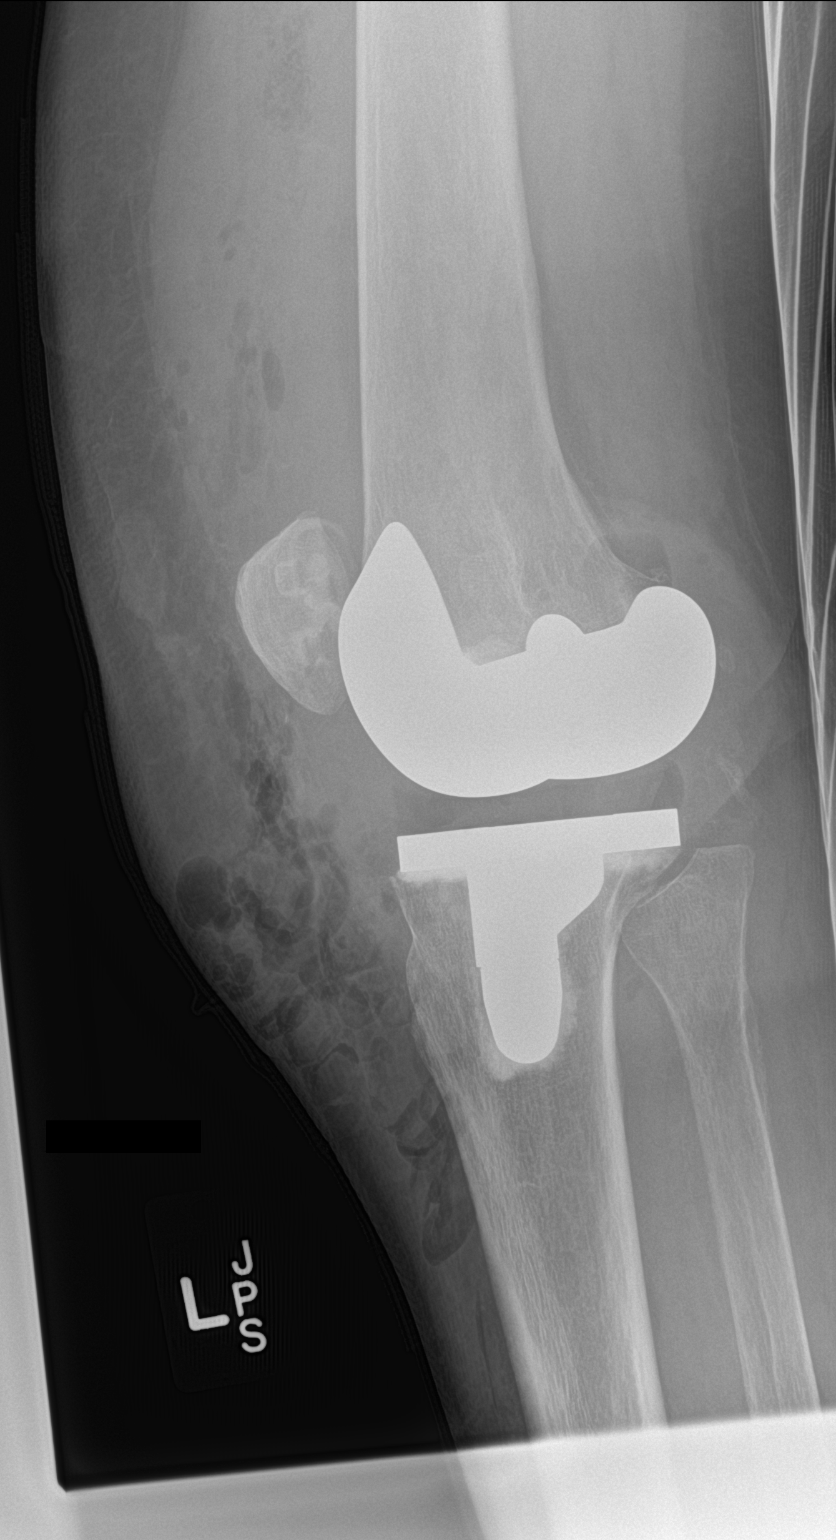

[2 of 2 positions shown; findings below may reference images not displayed]

FINDINGS: The femoral and tibial components appear to be well situated. No
fracture or dislocation is noted. Expected postoperative changes are
seen in the surrounding soft tissues.
IMPRESSION: Status post left total knee arthroplasty.

## 2018-08-18 DIAGNOSIS — K219 Gastro-esophageal reflux disease without esophagitis: Secondary | ICD-10-CM | POA: Diagnosis not present

## 2018-08-18 DIAGNOSIS — I10 Essential (primary) hypertension: Secondary | ICD-10-CM | POA: Diagnosis not present

## 2018-08-18 DIAGNOSIS — E785 Hyperlipidemia, unspecified: Secondary | ICD-10-CM | POA: Diagnosis not present

## 2018-08-18 DIAGNOSIS — F331 Major depressive disorder, recurrent, moderate: Secondary | ICD-10-CM | POA: Diagnosis not present

## 2018-08-18 DIAGNOSIS — E039 Hypothyroidism, unspecified: Secondary | ICD-10-CM | POA: Diagnosis not present

## 2018-08-18 DIAGNOSIS — E782 Mixed hyperlipidemia: Secondary | ICD-10-CM | POA: Diagnosis not present

## 2018-09-03 DIAGNOSIS — E039 Hypothyroidism, unspecified: Secondary | ICD-10-CM | POA: Diagnosis not present

## 2018-09-03 DIAGNOSIS — K219 Gastro-esophageal reflux disease without esophagitis: Secondary | ICD-10-CM | POA: Diagnosis not present

## 2018-09-03 DIAGNOSIS — F331 Major depressive disorder, recurrent, moderate: Secondary | ICD-10-CM | POA: Diagnosis not present

## 2018-09-03 DIAGNOSIS — E782 Mixed hyperlipidemia: Secondary | ICD-10-CM | POA: Diagnosis not present

## 2018-09-03 DIAGNOSIS — E785 Hyperlipidemia, unspecified: Secondary | ICD-10-CM | POA: Diagnosis not present

## 2018-09-03 DIAGNOSIS — I10 Essential (primary) hypertension: Secondary | ICD-10-CM | POA: Diagnosis not present

## 2018-10-04 DIAGNOSIS — Z0000 Encounter for general adult medical examination without abnormal findings: Secondary | ICD-10-CM | POA: Diagnosis not present

## 2018-10-10 ENCOUNTER — Telehealth: Payer: Self-pay

## 2018-10-10 NOTE — Telephone Encounter (Signed)
PT agrees to phone virtual visit. She will obtain vitals/medications prior to visit.      Virtual Visit Pre-Appointment Phone Call  "(Name), I am calling you today to discuss your upcoming appointment. We are currently trying to limit exposure to the virus that causes COVID-19 by seeing patients at home rather than in the office."  1. "What is the BEST phone number to call the day of the visit?" - include this in appointment notes  2. "Do you have or have access to (through a family member/friend) a smartphone with video capability that we can use for your visit?" a. If yes - list this number in appt notes as "cell" (if different from BEST phone #) and list the appointment type as a VIDEO visit in appointment notes b. If no - list the appointment type as a PHONE visit in appointment notes  3. Confirm consent - "In the setting of the current Covid19 crisis, you are scheduled for a (phone or video) visit with your provider on (date) at (time).  Just as we do with many in-office visits, in order for you to participate in this visit, we must obtain consent.  If you'd like, I can send this to your mychart (if signed up) or email for you to review.  Otherwise, I can obtain your verbal consent now.  All virtual visits are billed to your insurance company just like a normal visit would be.  By agreeing to a virtual visit, we'd like you to understand that the technology does not allow for your provider to perform an examination, and thus may limit your provider's ability to fully assess your condition. If your provider identifies any concerns that need to be evaluated in person, we will make arrangements to do so.  Finally, though the technology is pretty good, we cannot assure that it will always work on either your or our end, and in the setting of a video visit, we may have to convert it to a phone-only visit.  In either situation, we cannot ensure that we have a secure connection.  Are you willing to  proceed?" STAFF: Did the patient verbally acknowledge consent to telehealth visit? Document YES/NO here: YES   4. Advise patient to be prepared - "Two hours prior to your appointment, go ahead and check your blood pressure, pulse, oxygen saturation, and your weight (if you have the equipment to check those) and write them all down. When your visit starts, your provider will ask you for this information. If you have an Apple Watch or Kardia device, please plan to have heart rate information ready on the day of your appointment. Please have a pen and paper handy nearby the day of the visit as well."  5. Give patient instructions for MyChart download to smartphone OR [email or web address] as below if video visit (depending on what platform provider is using)  6. Inform patient they will receive a phone call 15 minutes prior to their appointment time (may be from unknown caller ID) so they should be prepared to answer    TELEPHONE CALL NOTE  [name] has been deemed a candidate for a follow-up tele-health visit to limit community exposure during the Covid-19 pandemic. I spoke with the patient via phone to ensure availability of phone/video source, confirm preferred email & phone number, and discuss instructions and expectations.  I reminded [name] to be prepared with any vital sign and/or heart rhythm information that could potentially be obtained via home monitoring, at  the time of her visit. I reminded [name] to expect a phone call prior to her visit.  Drema Dallas, [place] 10/10/2018 2:27 PM   INSTRUCTIONS FOR DOWNLOADING THE MYCHART APP TO SMARTPHONE  - The patient must first make sure to have activated MyChart and know their login information - If Apple, go to [employer] and type in MyChart in the search bar and download the app. If Android, ask patient to go to [employer] and type in [place] in the search bar and download the app. The app is free but as with any other  app downloads, their phone may require them to verify saved payment information or Apple/Android password.  - The patient will need to then log into the app with their MyChart username and password, and select [facility] as their healthcare provider to link the account. When it is time for your visit, go to the MyChart app, find appointments, and click Begin Video Visit. Be sure to Select Allow for your device to access the Microphone and Camera for your visit. You will then be connected, and your provider will be with you shortly.  **If they have any issues connecting, or need assistance please contact MyChart service desk [phone])**  **If using a computer, in order to ensure the best quality for their visit they will need to use either of the following Internet Browsers: [employer], or Google Chrome**  IF USING DOXIMITY or DOXY.ME - The patient will receive a link just prior to their visit by text.     FULL LENGTH CONSENT FOR TELE-HEALTH VISIT   I hereby voluntarily request, consent and authorize [facility] and its employed or contracted physicians, physician assistants, nurse practitioners or other licensed health care professionals (the Practitioner), to provide me with telemedicine health care services (the "Services") as deemed necessary by the treating Practitioner. I acknowledge and consent to receive the Services by the Practitioner via telemedicine. I understand that the telemedicine visit will involve communicating with the Practitioner through live audiovisual communication technology and the disclosure of certain medical information by electronic transmission. I acknowledge that I have been given the opportunity to request an in-person assessment or other available alternative prior to the telemedicine visit and am voluntarily participating in the telemedicine visit.  I understand that I have the right to withhold or withdraw my consent to the use of  telemedicine in the course of my care at any time, without affecting my right to future care or treatment, and that the Practitioner or I may terminate the telemedicine visit at any time. I understand that I have the right to inspect all information obtained and/or recorded in the course of the telemedicine visit and may receive copies of available information for a reasonable fee.  I understand that some of the potential risks of receiving the Services via telemedicine include:  Marland Kitchen Delay or interruption in medical evaluation due to technological equipment failure or disruption; . Information transmitted may not be sufficient (e.g. poor resolution of images) to allow for appropriate medical decision making by the Practitioner; and/or  . In rare instances, security protocols could fail, causing a breach of personal health information.  Furthermore, I acknowledge that it is my responsibility to provide information about my medical history, conditions and care that is complete and accurate to the best of my ability. I acknowledge that Practitioner's advice, recommendations, and/or decision may be based on factors not within their control, such as incomplete or inaccurate  data provided by me or distortions of diagnostic images or specimens that may result from electronic transmissions. I understand that the practice of medicine is not an exact science and that Practitioner makes no warranties or guarantees regarding treatment outcomes. I acknowledge that I will receive a copy of this consent concurrently upon execution via email to the email address I last provided but may also request a printed copy by calling the office of [facility].    I understand that my insurance will be billed for this visit.   I have read or had this consent read to me. . I understand the contents of this consent, which adequately explains the benefits and risks of the Services being provided via telemedicine.  . I have been  provided ample opportunity to ask questions regarding this consent and the Services and have had my questions answered to my satisfaction. . I give my informed consent for the services to be provided through the use of telemedicine in my medical care  By participating in this telemedicine visit I agree to the above.

## 2018-10-16 ENCOUNTER — Telehealth: Payer: Self-pay | Admitting: Cardiology

## 2018-10-16 DIAGNOSIS — E785 Hyperlipidemia, unspecified: Secondary | ICD-10-CM | POA: Diagnosis not present

## 2018-10-16 DIAGNOSIS — I10 Essential (primary) hypertension: Secondary | ICD-10-CM | POA: Diagnosis not present

## 2018-10-16 DIAGNOSIS — E039 Hypothyroidism, unspecified: Secondary | ICD-10-CM | POA: Diagnosis not present

## 2018-10-16 DIAGNOSIS — K219 Gastro-esophageal reflux disease without esophagitis: Secondary | ICD-10-CM | POA: Diagnosis not present

## 2018-10-16 NOTE — Telephone Encounter (Signed)
Virtual Visit Pre-Appointment Phone Call  "(Name), I am calling you today to discuss your upcoming appointment. We are currently trying to limit exposure to the virus that causes COVID-19 by seeing patients at home rather than in the office."  "What is the BEST phone number to call the day of the visit?" - [phone]  1. Do you have or have access to (through a family member/friend) a smartphone with video capability that we can use for your visit?" a. If yes - list this number in appt notes as cell (if different from BEST phone #) and list the appointment type as a VIDEO visit in appointment notes b. If no - list the appointment type as a PHONE visit in appointment notes  2. Confirm consent - "In the setting of the current Covid19 crisis, you are scheduled for a (phone or video) visit with your provider on (date) at (time).  Just as we do with many in-office visits, in order for you to participate in this visit, we must obtain consent.  If you'd like, I can send this to your mychart (if signed up) or email for you to review.  Otherwise, I can obtain your verbal consent now.  All virtual visits are billed to your insurance company just like a normal visit would be.  By agreeing to a virtual visit, we'd like you to understand that the technology does not allow for your provider to perform an examination, and thus may limit your provider's ability to fully assess your condition. If your provider identifies any concerns that need to be evaluated in person, we will make arrangements to do so.  Finally, though the technology is pretty good, we cannot assure that it will always work on either your or our end, and in the setting of a video visit, we may have to convert it to a phone-only visit.  In either situation, we cannot ensure that we have a secure connection.  Are you willing to proceed?" STAFF: Did the patient verbally acknowledge consent to telehealth visit? Document YES/NO here:  YES  3. Advise patient to be prepared - "Two hours prior to your appointment, go ahead and check your blood pressure, pulse, oxygen saturation, and your weight (if you have the equipment to check those) and write them all down. When your visit starts, your provider will ask you for this information. If you have an Apple Watch or Kardia device, please plan to have heart rate information ready on the day of your appointment. Please have a pen and paper handy nearby the day of the visit as well."  4. Give patient instructions for MyChart download to smartphone OR Doximity/Doxy.me as below if video visit (depending on what platform provider is using)  5. Inform patient they will receive a phone call 15 minutes prior to their appointment time (may be from unknown caller ID) so they should be prepared to answer    TELEPHONE CALL NOTE  [name] has been deemed a candidate for a follow-up tele-health visit to limit community exposure during the Covid-19 pandemic. I spoke with the patient via phone to ensure availability of phone/video source, confirm preferred email & phone number, and discuss instructions and expectations.  I reminded [name] to be prepared with any vital sign and/or heart rhythm information that could potentially be obtained via home monitoring, at the time of her visit. I reminded MACIAH FEEBACK to expect a phone call prior to her visit.  Vicky T  Slaughter 10/16/2018 11:19 AM   INSTRUCTIONS FOR DOWNLOADING THE MYCHART APP TO SMARTPHONE  - The patient must first make sure to have activated MyChart and know their login information - If Apple, go to [employer] and type in MyChart in the search bar and download the app. If Android, ask patient to go to [employer] and type in [place] in the search bar and download the app. The app is free but as with any other app downloads, their phone may require them to verify saved payment information or Apple/Android  password.  - The patient will need to then log into the app with their MyChart username and password, and select [facility] as their healthcare provider to link the account. When it is time for your visit, go to the MyChart app, find appointments, and click Begin Video Visit. Be sure to Select Allow for your device to access the Microphone and Camera for your visit. You will then be connected, and your provider will be with you shortly.  **If they have any issues connecting, or need assistance please contact MyChart service desk [phone])**  **If using a computer, in order to ensure the best quality for their visit they will need to use either of the following Internet Browsers: [employer], or Google Chrome**  IF USING DOXIMITY or DOXY.ME - The patient will receive a link just prior to their visit by text.     FULL LENGTH CONSENT FOR TELE-HEALTH VISIT   I hereby voluntarily request, consent and authorize [facility] and its employed or contracted physicians, physician assistants, nurse practitioners or other licensed health care professionals (the Practitioner), to provide me with telemedicine health care services (the Services") as deemed necessary by the treating Practitioner. I acknowledge and consent to receive the Services by the Practitioner via telemedicine. I understand that the telemedicine visit will involve communicating with the Practitioner through live audiovisual communication technology and the disclosure of certain medical information by electronic transmission. I acknowledge that I have been given the opportunity to request an in-person assessment or other available alternative prior to the telemedicine visit and am voluntarily participating in the telemedicine visit.  I understand that I have the right to withhold or withdraw my consent to the use of telemedicine in the course of my care at any time, without affecting my right to future care or treatment,  and that the Practitioner or I may terminate the telemedicine visit at any time. I understand that I have the right to inspect all information obtained and/or recorded in the course of the telemedicine visit and may receive copies of available information for a reasonable fee.  I understand that some of the potential risks of receiving the Services via telemedicine include:   Delay or interruption in medical evaluation due to technological equipment failure or disruption;  Information transmitted may not be sufficient (e.g. poor resolution of images) to allow for appropriate medical decision making by the Practitioner; and/or   In rare instances, security protocols could fail, causing a breach of personal health information.  Furthermore, I acknowledge that it is my responsibility to provide information about my medical history, conditions and care that is complete and accurate to the best of my ability. I acknowledge that Practitioner's advice, recommendations, and/or decision may be based on factors not within their control, such as incomplete or inaccurate data provided by me or distortions of diagnostic images or specimens that may result from electronic transmissions. I understand that the practice of  medicine is not an Chief Strategy Officer and that Practitioner makes no warranties or guarantees regarding treatment outcomes. I acknowledge that I will receive a copy of this consent concurrently upon execution via email to the email address I last provided but may also request a printed copy by calling the office of [facility].    I understand that my insurance will be billed for this visit.   I have read or had this consent read to me.  I understand the contents of this consent, which adequately explains the benefits and risks of the Services being provided via telemedicine.   I have been provided ample opportunity to ask questions regarding this consent and the Services and have had my questions  answered to my satisfaction.  I give my informed consent for the services to be provided through the use of telemedicine in my medical care  By participating in this telemedicine visit I agree to the above.

## 2018-10-17 ENCOUNTER — Telehealth (INDEPENDENT_AMBULATORY_CARE_PROVIDER_SITE_OTHER): Payer: PPO | Admitting: Cardiology

## 2018-10-17 VITALS — BP 176/74 | HR 77 | Ht 60.0 in | Wt 176.0 lb

## 2018-10-17 DIAGNOSIS — I10 Essential (primary) hypertension: Secondary | ICD-10-CM | POA: Diagnosis not present

## 2018-10-17 DIAGNOSIS — R42 Dizziness and giddiness: Secondary | ICD-10-CM

## 2018-10-17 NOTE — Progress Notes (Signed)
Virtual Visit via Telephone Note   This visit type was conducted due to national recommendations for restrictions regarding the COVID-19 Pandemic (e.g. social distancing) in an effort to limit this patient's exposure and mitigate transmission in our community.  Due to her co-morbid illnesses, this patient is at least at moderate risk for complications without adequate follow up.  This format is felt to be most appropriate for this patient at this time.  The patient did not have access to video technology/had technical difficulties with video requiring transitioning to audio format only (telephone).  All issues noted in this document were discussed and addressed.  No physical exam could be performed with this format.  Please refer to the patient's chart for her  consent to telehealth for [facility].   Date:  10/17/2018   ID:  [name], DOB [birth year], MRN [id number]  Patient Location: Home Provider Location: Home  PCP:  [name], MD  Cardiologist:  Dr Carlyle Dolly Electrophysiologist:  None   Evaluation Performed:  Follow-Up Visit  Chief Complaint:  1 year follow up  History of Present Illness:    [name] is a 73 y.o. female  seen today for follow up of the following medical problems.   1.HTN - reactions to amlodopine in the past -notes mention some issues with high K recently, have been cautious with losartan dosing.Currnetly on 50mg  bid - previous issues with bp spikes at home, she had been checking her bp's very frequently.  - last visit we changed HCTZ to every other day due to orthostatic symptoms - home bp's 110-130/60s-70s.  - dizziness has much improved with change in HCTZ dosing.    -checks at ome 3-4 times a week. On average SBP 120-140s - compliant with meds    The patient does not have symptoms concerning for COVID-19 infection (fever, chills, cough, or new shortness of breath).    Past Medical History:  Diagnosis Date  .  Arthritis   . Complication of anesthesia    "pt. holds her breath when waking up"  . GERD (gastroesophageal reflux disease)   . Hypertension   . Hypothyroidism   . PONV (postoperative nausea and vomiting)   . Staph infection    abdomen and returned 5 yrs. later   Past Surgical History:  Procedure Laterality Date  . ABDOMINAL HYSTERECTOMY    . CERVICAL FUSION  1990's  . COLON SURGERY    . FOOT SURGERY Left    reshaped  . HERNIA REPAIR    . JOINT REPLACEMENT Right    hip  . LUMBAR SPINE SURGERY    . REPLACEMENT TOTAL KNEE Right   . THYROID SURGERY    . TOTAL KNEE ARTHROPLASTY Left 08/21/2016   Procedure: LEFT TOTAL KNEE ARTHROPLASTY;  Surgeon: Renette Butters, MD;  Location: [facility];  Service: Orthopedics;  Laterality: Left;     Current Meds  Medication Sig  . DULoxetine (CYMBALTA) 30 MG capsule Take 30 mg by mouth daily.  . hydrochlorothiazide (MICROZIDE) 12.5 MG capsule Take 12.5 mg by mouth every other day.  . levothyroxine (SYNTHROID, LEVOTHROID) 75 MCG tablet Take 75 mcg by mouth daily before breakfast.  . losartan (COZAAR) 50 MG tablet Take 50 mg by mouth 2 (two) times daily.  . nebivolol (BYSTOLIC) 10 MG tablet Take 10 mg daily by mouth.  Marland Kitchen omeprazole (PRILOSEC) 20 MG capsule Take 1 capsule (20 mg total) by mouth daily. While taking anti inflammatory medicine daily  . rosuvastatin (CRESTOR)  20 MG tablet Take 20 mg by mouth daily.   . [DISCONTINUED] atorvastatin (LIPITOR) 40 MG tablet Take 1 tablet (40 mg total) by mouth daily.     Allergies:   Sulfamethoxazole and Sulfonamide derivatives   Social History   Tobacco Use  . Smoking status: Never Smoker  . Smokeless tobacco: Never Used  Substance Use Topics  . Alcohol use: No  . Drug use: No     Family Hx: The patient's family history is not on file.  ROS:   Please see the history of present illness.     All other systems reviewed and are negative.   Prior CV studies:   The following studies were reviewed  today:  03/2017 echo Study Conclusions  - Left ventricle: The cavity size was normal. Systolic function was normal. The estimated ejection fraction was in the range of 55% to 60%. Wall motion was normal; there were no regional wall motion abnormalities. Doppler parameters are consistent with abnormal left ventricular relaxation (grade 1 diastolic dysfunction). - Mitral valve: There was moderate regurgitation. - Atrial septum: No defect or patent foramen ovale was identified. - Tricuspid valve: There was mild regurgitation. - Pulmonic valve: There was mild regurgitation. - Pulmonary arteries: PA peak pressure: 41 mm Hg (S). - Systemic veins: The IVC was suboptimally visualized, but appeared dilated. There was normal respiratory variation. Estimated RAP 8 mmHg.  Labs/Other Tests and Data Reviewed:    EKG:  No ECG reviewed.  Recent Labs: No results found for requested labs within last 8760 hours.   Recent Lipid Panel Lab Results  Component Value Date/Time   CHOL 238 (H) 03/05/2017 08:27 PM   TRIG 44 03/05/2017 08:27 PM   HDL 70 03/05/2017 08:27 PM   CHOLHDL 3.4 03/05/2017 08:27 PM   LDLCALC 159 (H) 03/05/2017 08:27 PM    Wt Readings from Last 3 Encounters:  10/17/18 176 lb (79.8 kg)  04/17/18 171 lb (77.6 kg)  02/20/18 174 lb 12.8 oz (79.3 kg)     Objective:    Vital Signs:  BP (!) 176/74   Pulse 77   Ht 5' (1.524 m)   Wt 176 lb (79.8 kg)   BMI 34.37 kg/m      ASSESSMENT & PLAN:   1. HTN - previous issues with labile bp's, has done well recently - orthostatic symptoms improved with HCTZ every other day -bp elevated today, on average SBPs 120s-140s. Given her prior orthostatic symptoms and medication side effects reasonable control, continue current therapy.     COVID-19 Education: The signs and symptoms of COVID-19 were discussed with the patient and how to seek care for testing (follow up with PCP or arrange E-visit).  The importance of  social distancing was discussed today.  Time:   Today, I have spent 12 minutes with the patient with telehealth technology discussing the above problems.     Medication Adjustments/Labs and Tests Ordered: Current medicines are reviewed at length with the patient today.  Concerns regarding medicines are outlined above.   Tests Ordered: No orders of the defined types were placed in this encounter.   Medication Changes: No orders of the defined types were placed in this encounter.   Disposition:  Follow up 1 year  Signed, Carlyle Dolly, MD  10/17/2018 12:38 PM    [facility]

## 2018-10-17 NOTE — Progress Notes (Signed)

## 2018-10-23 DIAGNOSIS — E039 Hypothyroidism, unspecified: Secondary | ICD-10-CM | POA: Diagnosis not present

## 2018-10-23 DIAGNOSIS — E785 Hyperlipidemia, unspecified: Secondary | ICD-10-CM | POA: Diagnosis not present

## 2018-10-23 DIAGNOSIS — I10 Essential (primary) hypertension: Secondary | ICD-10-CM | POA: Diagnosis not present

## 2018-10-23 DIAGNOSIS — E782 Mixed hyperlipidemia: Secondary | ICD-10-CM | POA: Diagnosis not present

## 2018-11-05 DIAGNOSIS — E871 Hypo-osmolality and hyponatremia: Secondary | ICD-10-CM | POA: Diagnosis not present

## 2018-11-05 DIAGNOSIS — E039 Hypothyroidism, unspecified: Secondary | ICD-10-CM | POA: Diagnosis not present

## 2018-11-05 DIAGNOSIS — E875 Hyperkalemia: Secondary | ICD-10-CM | POA: Diagnosis not present

## 2018-11-05 DIAGNOSIS — E782 Mixed hyperlipidemia: Secondary | ICD-10-CM | POA: Diagnosis not present

## 2018-11-05 DIAGNOSIS — M1990 Unspecified osteoarthritis, unspecified site: Secondary | ICD-10-CM | POA: Diagnosis not present

## 2018-11-05 DIAGNOSIS — I10 Essential (primary) hypertension: Secondary | ICD-10-CM | POA: Diagnosis not present

## 2018-11-05 DIAGNOSIS — K219 Gastro-esophageal reflux disease without esophagitis: Secondary | ICD-10-CM | POA: Diagnosis not present

## 2018-11-05 DIAGNOSIS — F331 Major depressive disorder, recurrent, moderate: Secondary | ICD-10-CM | POA: Diagnosis not present

## 2018-11-10 DIAGNOSIS — M25562 Pain in left knee: Secondary | ICD-10-CM | POA: Diagnosis not present

## 2018-11-10 DIAGNOSIS — I10 Essential (primary) hypertension: Secondary | ICD-10-CM | POA: Diagnosis not present

## 2018-11-10 DIAGNOSIS — E785 Hyperlipidemia, unspecified: Secondary | ICD-10-CM | POA: Diagnosis not present

## 2018-11-10 DIAGNOSIS — E039 Hypothyroidism, unspecified: Secondary | ICD-10-CM | POA: Diagnosis not present

## 2018-11-10 DIAGNOSIS — K219 Gastro-esophageal reflux disease without esophagitis: Secondary | ICD-10-CM | POA: Diagnosis not present

## 2018-11-24 DIAGNOSIS — M25562 Pain in left knee: Secondary | ICD-10-CM | POA: Diagnosis not present

## 2018-12-01 DIAGNOSIS — H2513 Age-related nuclear cataract, bilateral: Secondary | ICD-10-CM | POA: Diagnosis not present

## 2018-12-03 DIAGNOSIS — M545 Low back pain: Secondary | ICD-10-CM | POA: Diagnosis not present

## 2018-12-03 DIAGNOSIS — M6281 Muscle weakness (generalized): Secondary | ICD-10-CM | POA: Diagnosis not present

## 2018-12-03 DIAGNOSIS — R269 Unspecified abnormalities of gait and mobility: Secondary | ICD-10-CM | POA: Diagnosis not present

## 2018-12-09 DIAGNOSIS — M6281 Muscle weakness (generalized): Secondary | ICD-10-CM | POA: Diagnosis not present

## 2018-12-09 DIAGNOSIS — M545 Low back pain: Secondary | ICD-10-CM | POA: Diagnosis not present

## 2018-12-09 DIAGNOSIS — R269 Unspecified abnormalities of gait and mobility: Secondary | ICD-10-CM | POA: Diagnosis not present

## 2018-12-12 DIAGNOSIS — M545 Low back pain: Secondary | ICD-10-CM | POA: Diagnosis not present

## 2018-12-12 DIAGNOSIS — E785 Hyperlipidemia, unspecified: Secondary | ICD-10-CM | POA: Diagnosis not present

## 2018-12-12 DIAGNOSIS — R269 Unspecified abnormalities of gait and mobility: Secondary | ICD-10-CM | POA: Diagnosis not present

## 2018-12-12 DIAGNOSIS — I10 Essential (primary) hypertension: Secondary | ICD-10-CM | POA: Diagnosis not present

## 2018-12-12 DIAGNOSIS — K219 Gastro-esophageal reflux disease without esophagitis: Secondary | ICD-10-CM | POA: Diagnosis not present

## 2018-12-12 DIAGNOSIS — E039 Hypothyroidism, unspecified: Secondary | ICD-10-CM | POA: Diagnosis not present

## 2018-12-12 DIAGNOSIS — M6281 Muscle weakness (generalized): Secondary | ICD-10-CM | POA: Diagnosis not present

## 2018-12-15 DIAGNOSIS — M545 Low back pain: Secondary | ICD-10-CM | POA: Diagnosis not present

## 2018-12-15 DIAGNOSIS — M6281 Muscle weakness (generalized): Secondary | ICD-10-CM | POA: Diagnosis not present

## 2018-12-15 DIAGNOSIS — R269 Unspecified abnormalities of gait and mobility: Secondary | ICD-10-CM | POA: Diagnosis not present

## 2018-12-17 DIAGNOSIS — M545 Low back pain: Secondary | ICD-10-CM | POA: Diagnosis not present

## 2018-12-17 DIAGNOSIS — M6281 Muscle weakness (generalized): Secondary | ICD-10-CM | POA: Diagnosis not present

## 2018-12-17 DIAGNOSIS — R269 Unspecified abnormalities of gait and mobility: Secondary | ICD-10-CM | POA: Diagnosis not present

## 2018-12-22 DIAGNOSIS — M545 Low back pain: Secondary | ICD-10-CM | POA: Diagnosis not present

## 2018-12-22 DIAGNOSIS — M6281 Muscle weakness (generalized): Secondary | ICD-10-CM | POA: Diagnosis not present

## 2018-12-22 DIAGNOSIS — R269 Unspecified abnormalities of gait and mobility: Secondary | ICD-10-CM | POA: Diagnosis not present

## 2018-12-29 DIAGNOSIS — M545 Low back pain: Secondary | ICD-10-CM | POA: Diagnosis not present

## 2018-12-29 DIAGNOSIS — M6281 Muscle weakness (generalized): Secondary | ICD-10-CM | POA: Diagnosis not present

## 2018-12-29 DIAGNOSIS — R269 Unspecified abnormalities of gait and mobility: Secondary | ICD-10-CM | POA: Diagnosis not present

## 2018-12-31 DIAGNOSIS — M545 Low back pain: Secondary | ICD-10-CM | POA: Diagnosis not present

## 2018-12-31 DIAGNOSIS — R269 Unspecified abnormalities of gait and mobility: Secondary | ICD-10-CM | POA: Diagnosis not present

## 2018-12-31 DIAGNOSIS — M6281 Muscle weakness (generalized): Secondary | ICD-10-CM | POA: Diagnosis not present

## 2019-01-07 DIAGNOSIS — M1712 Unilateral primary osteoarthritis, left knee: Secondary | ICD-10-CM | POA: Diagnosis not present

## 2019-01-07 DIAGNOSIS — M25562 Pain in left knee: Secondary | ICD-10-CM | POA: Diagnosis not present

## 2019-01-07 DIAGNOSIS — M25662 Stiffness of left knee, not elsewhere classified: Secondary | ICD-10-CM | POA: Diagnosis not present

## 2019-01-07 DIAGNOSIS — R262 Difficulty in walking, not elsewhere classified: Secondary | ICD-10-CM | POA: Diagnosis not present

## 2019-01-08 DIAGNOSIS — E785 Hyperlipidemia, unspecified: Secondary | ICD-10-CM | POA: Diagnosis not present

## 2019-01-08 DIAGNOSIS — K219 Gastro-esophageal reflux disease without esophagitis: Secondary | ICD-10-CM | POA: Diagnosis not present

## 2019-01-08 DIAGNOSIS — I10 Essential (primary) hypertension: Secondary | ICD-10-CM | POA: Diagnosis not present

## 2019-01-08 DIAGNOSIS — E039 Hypothyroidism, unspecified: Secondary | ICD-10-CM | POA: Diagnosis not present

## 2019-01-08 NOTE — H&P (Signed)
Surgical History & Physical  Patient Name: Zakira Ressel DOB: 08-31-1945  Surgery: Cataract extraction with intraocular lens implant phacoemulsification; Right Eye  Surgeon: [name] MD Surgery Date:  01/22/2019 Pre-Op Date:  01/08/2019  HPI: A 58 Yr. old female patient 1. The patient complains of difficulty when viewing TV, reading closed caption, news scrolls on TV, which began many years ago. Both eyes are affected. The episode is gradual. The condition's severity increased since last visit. Symptoms occur when the patient is driving and outside. The complaint is associated with glare. HPI was performed by [name] .  Medical History: Arthritis High Blood Pressure Thyroid Problems  Review of Systems Negative Allergic/Immunologic Negative Cardiovascular Negative Constitutional Negative Ear, Nose, Mouth & Throat Negative Endocrine Negative Eyes Negative Gastrointestinal Negative Genitourinary Negative Hemotologic/Lymphatic Negative Integumentary Negative Musculoskeletal Negative Neurological Negative Psychiatry Negative Respiratory  Social   Never smoked    Medication Bystolic, Clonidine, Duloxetine, HCTZ, Levothyroxine Sodium, Omeprazole, Rosuvastatin,   Sx/Procedures Knee Replacement, Hip Replacement, Thyroid Nodule Removed, Bowel resection, Hernia Sx, Neck surgery, Lower back surgery, Elbow sx, Foot reconstruction, Hysterectomy,   Drug Allergies  Sulfa (sulfonamide antibiotics),   History & Physical: Heent:  Cataract, Right eye NECK: supple without bruits LUNGS: lungs clear to auscultation CV: regular rate and rhythm Abdomen: soft and non-tender  Impression & Plan: Assessment: 1.  NUCLEAR SCLEROSIS AGE RELATED; Both Eyes (H25.13) 2.  ASTIGMATISM, REGULAR; Both Eyes (H52.223)  Plan: 1.  Cataract accounts for the patient's decreased vision. This visual impairment is not correctable with a tolerable change in glasses or contact lenses. Cataract  surgery with an implantation of a new lens should significantly improve the visual and functional status of the patient. Discussed all risks, benefits, alternatives, and potential complications. Discussed the procedures and recovery. Patient desires to have surgery. A-scan ordered and performed today for intra-ocular lens calculations. The surgery will be performed in order to improve vision for driving, reading, and for eye examinations. Recommend phacoemulsification with intra-ocular lens. Right Eye. worse - frist. Dilates well - shugarcaine by protocol. Symfony Toric Lens. 2.  Visually significant - toric lens as above.

## 2019-01-09 DIAGNOSIS — R269 Unspecified abnormalities of gait and mobility: Secondary | ICD-10-CM | POA: Diagnosis not present

## 2019-01-09 DIAGNOSIS — M545 Low back pain: Secondary | ICD-10-CM | POA: Diagnosis not present

## 2019-01-09 DIAGNOSIS — M6281 Muscle weakness (generalized): Secondary | ICD-10-CM | POA: Diagnosis not present

## 2019-01-15 DIAGNOSIS — H2511 Age-related nuclear cataract, right eye: Secondary | ICD-10-CM | POA: Diagnosis not present

## 2019-01-15 NOTE — Patient Instructions (Signed)
LANEY LOUDERBACK  01/15/2019     @PREFPERIOPPHARMACY @   Your procedure is scheduled on  01/22/2019.  Report to [facility] at  [phone]  A.M.  Call this number if you have problems the morning of surgery:  [phone]   Remember:  Do not eat or drink after midnight.                        Take these medicines the morning of surgery with A SIP OF WATER  Cymbalta, levothyroxine, nebvolol, prilosec.    Do not wear jewelry, make-up or nail polish.  Do not wear lotions, powders, or perfumes. Please wear deodorant and brush your teeth.  Do not shave 48 hours prior to surgery.  Men may shave face and neck.  Do not bring valuables to the hospital.  [facility] is not responsible for any belongings or valuables.  Contacts, dentures or bridgework may not be worn into surgery.  Leave your suitcase in the car.  After surgery it may be brought to your room.  For patients admitted to the hospital, discharge time will be determined by your treatment team.  Patients discharged the day of surgery will not be allowed to drive home.   Name and phone number of your driver:   family Special instructions:  None  Please read over the following fact sheets that you were given. Anesthesia Post-op Instructions and Care and Recovery After Surgery       Cataract Surgery, Care After This sheet gives you information about how to care for yourself after your procedure. Your health care provider may also give you more specific instructions. If you have problems or questions, contact your health care provider. What can I expect after the procedure? After the procedure, it is common to have:  Itching.  Discomfort.  Fluid discharge.  Sensitivity to light and to touch.  Bruising in or around the eye.  Mild blurred vision. Follow these instructions at home: Eye care   Do not touch or rub your eyes.  Protect your eyes as told by your health care provider. You may be told to wear a  protective eye shield or sunglasses.  Do not put a contact lens into the affected eye or eyes until your health care provider approves.  Keep the area around your eye clean and dry: ? Avoid swimming. ? Do not allow water to hit you directly in the face while showering. ? Keep soap and shampoo out of your eyes.  Check your eye every day for signs of infection. Watch for: ? Redness, swelling, or pain. ? Fluid, blood, or pus. ? Warmth. ? A bad smell. ? Vision that is getting worse. ? Sensitivity that is getting worse. Activity  Do not drive for 24 hours if you were given a sedative during your procedure.  Avoid strenuous activities, such as playing contact sports, for as long as told by your health care provider.  Do not drive or use heavy machinery until your health care provider approves.  Do not bend or lift heavy objects. Bending increases pressure in the eye. You can walk, climb stairs, and do light household chores.  Ask your health care provider when you can return to work. If you work in a dusty environment, you may be advised to wear protective eyewear for a period of time. General instructions  Take or apply over-the-counter and prescription medicines only as told by your health care  provider. This includes eye drops.  Keep all follow-up visits as told by your health care provider. This is important. Contact a health care provider if:  You have increased bruising around your eye.  You have pain that is not helped with medicine.  You have a fever.  You have redness, swelling, or pain in your eye.  You have fluid, blood, or pus coming from your incision.  Your vision gets worse.  Your sensitivity to light gets worse. Get help right away if:  You have sudden loss of vision.  You see flashes of light or spots (floaters).  You have severe eye pain.  You develop nausea or vomiting. Summary  After your procedure, it is common to have itching, discomfort,  bruising, fluid discharge, or sensitivity to light.  Follow instructions from your health care provider about caring for your eye after the procedure.  Do not rub your eye after the procedure. You may need to wear eye protection or sunglasses. Do not wear contact lenses. Keep the area around your eye clean and dry.  Avoid activities that require a lot of effort. These include playing sports and lifting heavy objects.  Contact a health care provider if you have increased bruising, pain that does not go away, or a fever. Get help right away if you suddenly lose your vision, see flashes of light or spots, or have severe pain in the eye. This information is not intended to replace advice given to you by your health care provider. Make sure you discuss any questions you have with your health care provider. Document Released: 12/08/2004 Document Revised: 11/18/2017 Document Reviewed: 11/18/2017 Elsevier Patient Education  2020 [place] After These instructions provide you with information about caring for yourself after your procedure. Your health care provider may also give you more specific instructions. Your treatment has been planned according to current medical practices, but problems sometimes occur. Call your health care provider if you have any problems or questions after your procedure. What can I expect after the procedure? After your procedure, you may:  Feel sleepy for several hours.  Feel clumsy and have poor balance for several hours.  Feel forgetful about what happened after the procedure.  Have poor judgment for several hours.  Feel nauseous or vomit.  Have a sore throat if you had a breathing tube during the procedure. Follow these instructions at home: For at least 24 hours after the procedure:      Have a responsible adult stay with you. It is important to have someone help care for you until you are awake and alert.  Rest as  needed.  Do not: ? Participate in activities in which you could fall or become injured. ? Drive. ? Use heavy machinery. ? Drink alcohol. ? Take sleeping pills or medicines that cause drowsiness. ? Make important decisions or sign legal documents. ? Take care of children on your own. Eating and drinking  Follow the diet that is recommended by your health care provider.  If you vomit, drink water, juice, or soup when you can drink without vomiting.  Make sure you have little or no nausea before eating solid foods. General instructions  Take over-the-counter and prescription medicines only as told by your health care provider.  If you have sleep apnea, surgery and certain medicines can increase your risk for breathing problems. Follow instructions from your health care provider about wearing your sleep device: ? Anytime you are sleeping, including during daytime  naps. ? While taking prescription pain medicines, sleeping medicines, or medicines that make you drowsy.  If you smoke, do not smoke without supervision.  Keep all follow-up visits as told by your health care provider. This is important. Contact a health care provider if:  You keep feeling nauseous or you keep vomiting.  You feel light-headed.  You develop a rash.  You have a fever. Get help right away if:  You have trouble breathing. Summary  For several hours after your procedure, you may feel sleepy and have poor judgment.  Have a responsible adult stay with you for at least 24 hours or until you are awake and alert. This information is not intended to replace advice given to you by your health care provider. Make sure you discuss any questions you have with your health care provider. Document Released: 09/11/2015 Document Revised: 08/19/2017 Document Reviewed: 09/11/2015 Elsevier Patient Education  2020 [employer].

## 2019-01-19 ENCOUNTER — Other Ambulatory Visit: Payer: Self-pay

## 2019-01-19 ENCOUNTER — Encounter (HOSPITAL_COMMUNITY): Payer: Self-pay

## 2019-01-19 ENCOUNTER — Encounter (HOSPITAL_COMMUNITY)
Admission: RE | Admit: 2019-01-19 | Discharge: 2019-01-19 | Disposition: A | Discharge status: Home / Self Care | Payer: PPO | Source: Ambulatory Visit | Attending: Ophthalmology | Admitting: Ophthalmology

## 2019-01-19 ENCOUNTER — Other Ambulatory Visit (HOSPITAL_COMMUNITY)
Admission: RE | Admit: 2019-01-19 | Discharge: 2019-01-19 | Disposition: A | Discharge status: Home / Self Care | Payer: PPO | Source: Ambulatory Visit | Attending: Ophthalmology | Admitting: Ophthalmology

## 2019-01-19 DIAGNOSIS — I10 Essential (primary) hypertension: Secondary | ICD-10-CM | POA: Diagnosis not present

## 2019-01-19 DIAGNOSIS — H2513 Age-related nuclear cataract, bilateral: Secondary | ICD-10-CM | POA: Diagnosis not present

## 2019-01-19 DIAGNOSIS — K219 Gastro-esophageal reflux disease without esophagitis: Secondary | ICD-10-CM | POA: Diagnosis not present

## 2019-01-19 DIAGNOSIS — Z96649 Presence of unspecified artificial hip joint: Secondary | ICD-10-CM | POA: Diagnosis not present

## 2019-01-19 DIAGNOSIS — H52223 Regular astigmatism, bilateral: Secondary | ICD-10-CM | POA: Diagnosis not present

## 2019-01-19 DIAGNOSIS — Z01812 Encounter for preprocedural laboratory examination: Secondary | ICD-10-CM | POA: Insufficient documentation

## 2019-01-19 DIAGNOSIS — Z96659 Presence of unspecified artificial knee joint: Secondary | ICD-10-CM | POA: Diagnosis not present

## 2019-01-19 DIAGNOSIS — Z79890 Hormone replacement therapy (postmenopausal): Secondary | ICD-10-CM | POA: Diagnosis not present

## 2019-01-19 DIAGNOSIS — Z20828 Contact with and (suspected) exposure to other viral communicable diseases: Secondary | ICD-10-CM | POA: Insufficient documentation

## 2019-01-19 DIAGNOSIS — Z79899 Other long term (current) drug therapy: Secondary | ICD-10-CM | POA: Diagnosis not present

## 2019-01-19 HISTORY — DX: Depression, unspecified: F32.A

## 2019-01-19 HISTORY — DX: Anxiety disorder, unspecified: F41.9

## 2019-01-19 HISTORY — DX: Anemia, unspecified: D64.9

## 2019-01-19 LAB — SARS CORONAVIRUS 2 (TAT 6-24 HRS): SARS Coronavirus 2: NEGATIVE

## 2019-01-19 LAB — BASIC METABOLIC PANEL
Anion gap: 9 (ref 5–15)
BUN: 11 mg/dL (ref 8–23)
CO2: 27 mmol/L (ref 22–32)
Calcium: 9.1 mg/dL (ref 8.9–10.3)
Chloride: 96 mmol/L — ABNORMAL LOW (ref 98–111)
Creatinine, Ser: 0.65 mg/dL (ref 0.44–1.00)
GFR calc Af Amer: >60 mL/min (ref >60)
GFR calc non Af Amer: >60 mL/min (ref >60)
Glucose, Bld: 88 mg/dL (ref 70–99)
Potassium: 4.2 mmol/L (ref 3.5–5.1)
Sodium: 132 mmol/L — ABNORMAL LOW (ref 135–145)

## 2019-01-22 ENCOUNTER — Encounter (HOSPITAL_COMMUNITY)
Admission: RE | Disposition: A | Discharge status: Home / Self Care | Payer: Self-pay | Source: Home / Self Care | Attending: Ophthalmology

## 2019-01-22 ENCOUNTER — Ambulatory Visit (HOSPITAL_COMMUNITY): Payer: PPO | Admitting: Anesthesiology

## 2019-01-22 ENCOUNTER — Encounter (HOSPITAL_COMMUNITY): Payer: Self-pay | Admitting: *Deleted

## 2019-01-22 ENCOUNTER — Other Ambulatory Visit: Payer: Self-pay

## 2019-01-22 ENCOUNTER — Ambulatory Visit (HOSPITAL_COMMUNITY)
Admission: RE | Admit: 2019-01-22 | Discharge: 2019-01-22 | Disposition: A | Discharge status: Home / Self Care | Payer: PPO | Attending: Ophthalmology | Admitting: Ophthalmology

## 2019-01-22 DIAGNOSIS — Z96649 Presence of unspecified artificial hip joint: Secondary | ICD-10-CM | POA: Diagnosis not present

## 2019-01-22 DIAGNOSIS — Z79890 Hormone replacement therapy (postmenopausal): Secondary | ICD-10-CM | POA: Insufficient documentation

## 2019-01-22 DIAGNOSIS — Z96659 Presence of unspecified artificial knee joint: Secondary | ICD-10-CM | POA: Insufficient documentation

## 2019-01-22 DIAGNOSIS — Z20828 Contact with and (suspected) exposure to other viral communicable diseases: Secondary | ICD-10-CM | POA: Insufficient documentation

## 2019-01-22 DIAGNOSIS — I10 Essential (primary) hypertension: Secondary | ICD-10-CM | POA: Insufficient documentation

## 2019-01-22 DIAGNOSIS — H52223 Regular astigmatism, bilateral: Secondary | ICD-10-CM | POA: Insufficient documentation

## 2019-01-22 DIAGNOSIS — H2513 Age-related nuclear cataract, bilateral: Secondary | ICD-10-CM | POA: Diagnosis not present

## 2019-01-22 DIAGNOSIS — Z79899 Other long term (current) drug therapy: Secondary | ICD-10-CM | POA: Insufficient documentation

## 2019-01-22 DIAGNOSIS — H2511 Age-related nuclear cataract, right eye: Secondary | ICD-10-CM | POA: Diagnosis not present

## 2019-01-22 DIAGNOSIS — K219 Gastro-esophageal reflux disease without esophagitis: Secondary | ICD-10-CM | POA: Insufficient documentation

## 2019-01-22 HISTORY — PX: CATARACT EXTRACTION W/PHACO: SHX586

## 2019-01-22 SURGERY — PHACOEMULSIFICATION, CATARACT, WITH IOL INSERTION
Anesthesia: Monitor Anesthesia Care | Site: Eye | Laterality: Right

## 2019-01-22 MED ORDER — BSS IO SOLN
INTRAOCULAR | Status: DC | PRN
  Administered 2019-01-22: 15 mL

## 2019-01-22 MED ORDER — TETRACAINE HCL 0.5 % OP SOLN
1.0000 [drp] | OPHTHALMIC | Status: AC | PRN
  Administered 2019-01-22 (×3): 1 [drp] via OPHTHALMIC

## 2019-01-22 MED ORDER — POVIDONE-IODINE 5 % OP SOLN
OPHTHALMIC | Status: DC | PRN
  Administered 2019-01-22: 1 via OPHTHALMIC

## 2019-01-22 MED ORDER — PHENYLEPHRINE HCL 2.5 % OP SOLN
1.0000 [drp] | OPHTHALMIC | Status: AC | PRN
  Administered 2019-01-22 (×3): 1 [drp] via OPHTHALMIC

## 2019-01-22 MED ORDER — SODIUM CHLORIDE 0.9% FLUSH
10.0000 mL | INTRAVENOUS | Status: DC | PRN
  Administered 2019-01-22: 3 mL via INTRAVENOUS
  Filled 2019-01-22: qty 10

## 2019-01-22 MED ORDER — LIDOCAINE HCL 3.5 % OP GEL: 1.0000 "application " | Freq: Once | OPHTHALMIC | Status: DC

## 2019-01-22 MED ORDER — EPINEPHRINE PF 1 MG/ML IJ SOLN
INTRAOCULAR | Status: DC | PRN
  Administered 2019-01-22: 500 mL

## 2019-01-22 MED ORDER — CYCLOPENTOLATE-PHENYLEPHRINE 0.2-1 % OP SOLN
1.0000 [drp] | OPHTHALMIC | Status: AC | PRN
  Administered 2019-01-22 (×3): 1 [drp] via OPHTHALMIC

## 2019-01-22 MED ORDER — PROVISC 10 MG/ML IO SOLN
INTRAOCULAR | Status: DC | PRN
  Administered 2019-01-22: 0.85 mL via INTRAOCULAR

## 2019-01-22 MED ORDER — LIDOCAINE HCL (PF) 1 % IJ SOLN
INTRAOCULAR | Status: DC | PRN
  Administered 2019-01-22: .9 mL via OPHTHALMIC

## 2019-01-22 MED ORDER — SODIUM HYALURONATE 23 MG/ML IO SOLN
INTRAOCULAR | Status: DC | PRN
  Administered 2019-01-22: 0.6 mL via INTRAOCULAR

## 2019-01-22 SURGICAL SUPPLY — 17 items
CLOTH BEACON ORANGE TIMEOUT ST (SAFETY) ×1 IMPLANT
EYE SHIELD UNIVERSAL CLEAR (GAUZE/BANDAGES/DRESSINGS) ×1 IMPLANT
GLOVE BIOGEL PI IND STRL 7.0 (GLOVE) IMPLANT
GLOVE BIOGEL PI INDICATOR 7.0 (GLOVE) ×2
LENS IOL SYMFON TORIC 150 23.5 ×1 IMPLANT
LENS IOL SYMFONY TORIC 23.5 ×2 IMPLANT
LENS IOL SYMFONY TRC 150 23.5 IMPLANT
NDL HYPO 18GX1.5 BLUNT FILL (NEEDLE) IMPLANT
NEEDLE HYPO 18GX1.5 BLUNT FILL (NEEDLE) ×2 IMPLANT
PAD ARMBOARD 7.5X6 YLW CONV (MISCELLANEOUS) ×1 IMPLANT
PROC W SPEC LENS (INTRAOCULAR LENS) ×2
PROCESS W SPEC LENS (INTRAOCULAR LENS) IMPLANT
SYR TB 1ML LL NO SAFETY (SYRINGE) ×1 IMPLANT
TAPE SURG TRANSPORE 1 IN (GAUZE/BANDAGES/DRESSINGS) IMPLANT
TAPE SURGICAL TRANSPORE 1 IN (GAUZE/BANDAGES/DRESSINGS) ×1
VISCOELASTIC ADDITIONAL (OPHTHALMIC RELATED) ×1 IMPLANT
WATER STERILE IRR 250ML POUR (IV SOLUTION) ×1 IMPLANT

## 2019-01-22 NOTE — Op Note (Addendum)
Date of procedure: 01/22/19  Pre-operative diagnosis: Visually significant age-related cataract, Right Eye; Visually Significant Astigmatism, Right Eye (H25.11)  Post-operative diagnosis: Visually significant age-related cataract, Right Eye; Visually Significant Astigmatism, Right Eye  Procedure: Removal of cataract via phacoemulsification and insertion of intra-ocular lens AMO [id number] +23.5D into the capsular bag of the Right Eye  Attending surgeon: Gerda Diss. Rich Paprocki, MD, MA  Anesthesia: MAC, Topical Akten  Complications: None  Estimated Blood Loss: <47m (minimal)  Specimens: None  Implants: As above  Indications:  Visually significant age-related cataract, Right Eye; Visually Significant Astigmatism, Right Eye  Procedure:  The patient was seen and identified in the pre-operative area. The operative eye was identified and dilated.  The operative eye was marked.  Pre-operative toric markers were used to mark the eye at 0 and 180 degrees. Topical anesthesia was administered to the operative eye.     The patient was then to the operative suite and placed in the supine position.  A timeout was performed confirming the patient, procedure to be performed, and all other relevant information.   The patient's face was prepped and draped in the usual fashion for intra-ocular surgery.  A lid speculum was placed into the operative eye and the surgical microscope moved into place and focused.  A superotemporal paracentesis was created using a 20 gauge paracentesis blade.  Shugarcaine was injected into the anterior chamber.  Viscoelastic was injected into the anterior chamber.  A temporal clear-corneal main wound incision was created using a 2.460mmicrokeratome.  A continuous curvilinear capsulorrhexis was initiated using an irrigating cystitome and completed using capsulorrhexis forceps.  Hydrodissection and hydrodeliniation were performed.  Viscoelastic was injected into the anterior chamber.  A  phacoemulsification handpiece and a chopper as a second instrument were used to remove the nucleus and epinucleus. The irrigation/aspiration handpiece was used to remove any remaining cortical material.   The capsular bag was reinflated with viscoelastic, checked, and found to be intact.  The intraocular lens was inserted into the capsular bag and dialed into place using a Kuglen hook to 107 degrees.  The irrigation/aspiration handpiece was used to remove any remaining viscoelastic.  The clear corneal wound and paracentesis wounds were then hydrated and checked with Weck-Cels to be watertight.  The lid-speculum and drape was removed, and the patient's face was cleaned with a wet and dry 4x4. A clear shield was taped over the eye. The patient was taken to the post-operative care unit in good condition, having tolerated the procedure well.  Post-Op Instructions: [facility] and will receive all other orders and instructions.

## 2019-01-22 NOTE — Discharge Instructions (Signed)
Please discharge patient when stable, will follow up today with Dr. Dicy Smigel at the [facility] office immediately following discharge.  Leave shield in place until visit.  All paperwork with discharge instructions will be given at the office. ° °

## 2019-01-22 NOTE — Anesthesia Postprocedure Evaluation (Signed)
Anesthesia Post Note  Patient: [name]  Procedure(s) Performed: CATARACT EXTRACTION PHACO AND INTRAOCULAR LENS PLACEMENT ([place]) (Right Eye)  Patient location during evaluation: Short Stay Anesthesia Type: MAC Level of consciousness: awake and alert Pain management: pain level controlled Vital Signs Assessment: post-procedure vital signs reviewed and stable Respiratory status: spontaneous breathing Cardiovascular status: stable Postop Assessment: no apparent nausea or vomiting Anesthetic complications: no     Last Vitals:  Vitals:   01/22/19 0728 01/22/19 0904  BP: (!) 144/80 (!) 151/74  Pulse: 80 78  Resp: (!) 23 17  Temp: 36.7 C 36.6 C  SpO2: 99% 98%    Last Pain:  Vitals:   01/22/19 0904  TempSrc: Oral  PainSc: 0-No pain                 Harlee Pursifull

## 2019-01-22 NOTE — Anesthesia Preprocedure Evaluation (Signed)
Anesthesia Evaluation  Patient identified by MRN, date of birth, ID band Patient awake    Reviewed: Allergy & Precautions, NPO status , Patient's Chart, lab work & pertinent test results, reviewed documented beta blocker date and time   History of Anesthesia Complications (+) PONV, PROLONGED EMERGENCE and history of anesthetic complications  Airway Mallampati: II  TM Distance: >3 FB Neck ROM: Full    Dental no notable dental hx. (+) Teeth Intact   Pulmonary neg pulmonary ROS,    Pulmonary exam normal breath sounds clear to auscultation       Cardiovascular Exercise Tolerance: Good hypertension, Pt. on medications and Pt. on home beta blockers negative cardio ROS Normal cardiovascular examI Rhythm:Regular Rate:Normal     Neuro/Psych  Headaches, Anxiety Depression TIAnegative psych ROS   GI/Hepatic Neg liver ROS, GERD  Medicated and Controlled,  Endo/Other  Hypothyroidism   Renal/GU negative Renal ROS  negative genitourinary   Musculoskeletal  (+) Arthritis ,   Abdominal   Peds negative pediatric ROS (+)  Hematology negative hematology ROS (+)   Anesthesia Other Findings   Reproductive/Obstetrics negative OB ROS                             Anesthesia Physical Anesthesia Plan  ASA: II  Anesthesia Plan: MAC   Post-op Pain Management:    Induction: Intravenous  PONV Risk Score and Plan: 3 and TIVA, Treatment may vary due to age or medical condition and Ondansetron  Airway Management Planned: Nasal Cannula  Additional Equipment:   Intra-op Plan:   Post-operative Plan:   Informed Consent: I have reviewed the patients History and Physical, chart, labs and discussed the procedure including the risks, benefits and alternatives for the proposed anesthesia with the patient or authorized representative who has indicated his/her understanding and acceptance.     Dental advisory  given  Plan Discussed with: CRNA  Anesthesia Plan Comments: (Plan Full PPE Plan MAC d/w pt -WTP with same )        Anesthesia Quick Evaluation

## 2019-01-22 NOTE — Interval H&P Note (Signed)
History and Physical Interval Note: The H and P was reviewed and updated. The patient was examined.  No changes were found after exam.  The surgical eye was marked.  01/22/2019 8:26 AM  [name]  has presented today for surgery, with the diagnosis of nuclear cataract right eye.  The various methods of treatment have been discussed with the patient and family. After consideration of risks, benefits and other options for treatment, the patient has consented to  Procedure(s) with comments: CATARACT EXTRACTION PHACO AND INTRAOCULAR LENS PLACEMENT ([facility]) (Right) - right as a surgical intervention.  The patient's history has been reviewed, patient examined, no change in status, stable for surgery.  I have reviewed the patient's chart and labs.  Questions were answered to the patient's satisfaction.     [name]

## 2019-01-22 NOTE — Transfer of Care (Signed)
Immediate Anesthesia Transfer of Care Note  Patient: [name]  Procedure(s) Performed: CATARACT EXTRACTION PHACO AND INTRAOCULAR LENS PLACEMENT ([facility]) (Right Eye)  Patient Location: Short Stay  Anesthesia Type:MAC  Level of Consciousness: awake, alert  and patient cooperative  Airway & Oxygen Therapy: Patient Spontanous Breathing  Post-op Assessment: Report given to RN and Post -op Vital signs reviewed and stable  Post vital signs: Reviewed and stable  Last Vitals:  Vitals Value Taken Time  BP 151/74 08/20/202     0902  Temp 97.9 01/22/2019   0902  Pulse 78 01/22/2019    0902  Resp 18 01/22/2019   0902  SpO2 98 01/22/2019    0902    Last Pain:  Vitals:   01/22/19 0728  TempSrc: Oral  PainSc: 0-No pain      Patients Stated Pain Goal: 3 (67/61/95 0932)  Complications: No apparent anesthesia complications

## 2019-01-22 NOTE — Anesthesia Procedure Notes (Signed)
Date/Time: 01/22/2019 8:30 AM Performed by: Vista Deck, CRNA Pre-anesthesia Checklist: Patient identified, Emergency Drugs available, Suction available, Timeout performed and Patient being monitored Patient Re-evaluated:Patient Re-evaluated prior to induction Oxygen Delivery Method: Nasal Cannula

## 2019-01-23 ENCOUNTER — Encounter (HOSPITAL_COMMUNITY): Payer: Self-pay | Admitting: Ophthalmology

## 2019-02-05 NOTE — H&P (Signed)
Surgical History & Physical  Patient Name: [name] DOB: 05-10-1946  Surgery: Cataract extraction with intraocular lens implant phacoemulsification; Left Eye  Surgeon: [name] MD Surgery Date:  02/16/2019 Pre-Op Date:  01/29/2019  HPI: A 67 Yr. old female patient 1. 1. The patient complains of difficulty when viewing TV, reading closed caption, news scrolls on TV, which began 3 months ago. The left eye is affected. The episode is gradual. The condition's severity increased since last visit. Symptoms occur when the patient is inside and outside. This is negatively affecting the patient's quality of life. 2. The patient is returning after cataract post-op. The right eye is affected. Status post cataract post-op, which began 1 week ago: Since the last visit, the affected area feels improvement. The patient's vision is improved. Patient is following medication instructions. HPI was performed by [name] .  Medical History: Arthritis High Blood Pressure Thyroid Problems  Review of Systems Negative Allergic/Immunologic Negative Cardiovascular Negative Constitutional Negative Ear, Nose, Mouth & Throat Negative Endocrine Negative Eyes Negative Gastrointestinal Negative Genitourinary Negative Hemotologic/Lymphatic Negative Integumentary Negative Musculoskeletal Negative Neurological Negative Psychiatry Negative Respiratory  Social   Never smoked   Medication Prednisolon-gatiflox-bromfenac,  Bystolic, Clonidine, Duloxetine, HCTZ, Levothyroxine Sodium, Omeprazole, Rosuvastatin,   Sx/Procedures Phaco c IOL OD-Multifocal,  Knee Replacement, Hip Replacement, Thyroid Nodule Removed, Bowel resection, Hernia Sx, Neck surgery, Lower back surgery, Elbow sx, Foot reconstruction, Hysterectomy,   Drug Allergies  Sulfa (sulfonamide antibiotics),   History & Physical: Heent:  Cataract, Left eye NECK: supple without bruits LUNGS: lungs clear to auscultation CV: regular  rate and rhythm Abdomen: soft and non-tender Impression & Plan: Assessment: 1.  CATARACT EXTRACTION STATUS; Right Eye (Z98.41) 2.  NUCLEAR SCLEROSIS AGE RELATED; , Left Eye (H25.12)  Plan: 1.  1 week after cataract surgery. Doing well with improved vision and normal eye pressure. Call with any problems or concerns. Continue Gati-Brom-Pred 2x/day for 3 more weeks. 2.  Cataract accounts for the patient's decreased vision. This visual impairment is not correctable with a tolerable change in glasses or contact lenses. Cataract surgery with an implantation of a new lens should significantly improve the visual and functional status of the patient. Discussed all risks, benefits, alternatives, and potential complications. Discussed the procedures and recovery. Patient desires to have surgery. A-scan ordered and performed today for intra-ocular lens calculations. The surgery will be performed in order to improve vision for driving, reading, and for eye examinations. Recommend phacoemulsification with intra-ocular lens. Left Eye. Surgery required to correct imbalance of vision. Dilates well - shugarcaine by protocol. [employer] Lens.

## 2019-02-09 DIAGNOSIS — H25812 Combined forms of age-related cataract, left eye: Secondary | ICD-10-CM | POA: Diagnosis not present

## 2019-02-12 ENCOUNTER — Encounter (HOSPITAL_COMMUNITY)
Admission: RE | Admit: 2019-02-12 | Discharge: 2019-02-12 | Disposition: A | Discharge status: Home / Self Care | Payer: PPO | Source: Ambulatory Visit | Attending: Ophthalmology | Admitting: Ophthalmology

## 2019-02-12 ENCOUNTER — Other Ambulatory Visit: Payer: Self-pay

## 2019-02-12 ENCOUNTER — Other Ambulatory Visit (HOSPITAL_COMMUNITY)
Admission: RE | Admit: 2019-02-12 | Discharge: 2019-02-12 | Disposition: A | Discharge status: Home / Self Care | Payer: PPO | Source: Ambulatory Visit | Attending: Ophthalmology | Admitting: Ophthalmology

## 2019-02-12 DIAGNOSIS — Z01812 Encounter for preprocedural laboratory examination: Secondary | ICD-10-CM | POA: Diagnosis not present

## 2019-02-12 DIAGNOSIS — Z20828 Contact with and (suspected) exposure to other viral communicable diseases: Secondary | ICD-10-CM | POA: Insufficient documentation

## 2019-02-12 LAB — SARS CORONAVIRUS 2 (TAT 6-24 HRS): SARS Coronavirus 2: NEGATIVE

## 2019-02-13 MED ORDER — HYDROMORPHONE HCL 1 MG/ML IJ SOLN: 0.2500 mg | INTRAMUSCULAR | Status: DC | PRN

## 2019-02-13 MED ORDER — LACTATED RINGERS IV SOLN: INTRAVENOUS | Status: DC

## 2019-02-13 MED ORDER — HYDROCODONE-ACETAMINOPHEN 7.5-325 MG PO TABS: 1.0000 | ORAL_TABLET | Freq: Once | ORAL | Status: DC | PRN

## 2019-02-13 MED ORDER — MIDAZOLAM HCL 2 MG/2ML IJ SOLN: 0.5000 mg | Freq: Once | INTRAMUSCULAR | Status: DC | PRN

## 2019-02-13 MED ORDER — PROMETHAZINE HCL 25 MG/ML IJ SOLN: 6.2500 mg | INTRAMUSCULAR | Status: DC | PRN

## 2019-02-16 ENCOUNTER — Other Ambulatory Visit: Payer: Self-pay

## 2019-02-16 ENCOUNTER — Ambulatory Visit (HOSPITAL_COMMUNITY): Payer: PPO | Admitting: Anesthesiology

## 2019-02-16 ENCOUNTER — Encounter (HOSPITAL_COMMUNITY)
Admission: RE | Disposition: A | Discharge status: Home / Self Care | Payer: Self-pay | Source: Home / Self Care | Attending: Ophthalmology

## 2019-02-16 ENCOUNTER — Ambulatory Visit (HOSPITAL_COMMUNITY)
Admission: RE | Admit: 2019-02-16 | Discharge: 2019-02-16 | Disposition: A | Discharge status: Home / Self Care | Payer: PPO | Attending: Ophthalmology | Admitting: Ophthalmology

## 2019-02-16 DIAGNOSIS — E039 Hypothyroidism, unspecified: Secondary | ICD-10-CM | POA: Diagnosis not present

## 2019-02-16 DIAGNOSIS — Z79890 Hormone replacement therapy (postmenopausal): Secondary | ICD-10-CM | POA: Diagnosis not present

## 2019-02-16 DIAGNOSIS — Z79899 Other long term (current) drug therapy: Secondary | ICD-10-CM | POA: Insufficient documentation

## 2019-02-16 DIAGNOSIS — I10 Essential (primary) hypertension: Secondary | ICD-10-CM | POA: Diagnosis not present

## 2019-02-16 DIAGNOSIS — H2512 Age-related nuclear cataract, left eye: Secondary | ICD-10-CM | POA: Insufficient documentation

## 2019-02-16 DIAGNOSIS — K219 Gastro-esophageal reflux disease without esophagitis: Secondary | ICD-10-CM | POA: Diagnosis not present

## 2019-02-16 DIAGNOSIS — H25812 Combined forms of age-related cataract, left eye: Secondary | ICD-10-CM | POA: Diagnosis not present

## 2019-02-16 DIAGNOSIS — M1990 Unspecified osteoarthritis, unspecified site: Secondary | ICD-10-CM | POA: Diagnosis not present

## 2019-02-16 HISTORY — PX: CATARACT EXTRACTION W/PHACO: SHX586

## 2019-02-16 SURGERY — PHACOEMULSIFICATION, CATARACT, WITH IOL INSERTION
Anesthesia: Monitor Anesthesia Care | Site: Eye | Laterality: Left

## 2019-02-16 MED ORDER — POVIDONE-IODINE 5 % OP SOLN
OPHTHALMIC | Status: DC | PRN
  Administered 2019-02-16: 1 via OPHTHALMIC

## 2019-02-16 MED ORDER — PROVISC 10 MG/ML IO SOLN
INTRAOCULAR | Status: DC | PRN
  Administered 2019-02-16: 0.85 mL via INTRAOCULAR

## 2019-02-16 MED ORDER — LIDOCAINE HCL (PF) 1 % IJ SOLN
INTRAOCULAR | Status: DC | PRN
  Administered 2019-02-16: .9 mL via OPHTHALMIC

## 2019-02-16 MED ORDER — SODIUM HYALURONATE 23 MG/ML IO SOLN
INTRAOCULAR | Status: DC | PRN
  Administered 2019-02-16: 0.6 mL via INTRAOCULAR

## 2019-02-16 MED ORDER — CYCLOPENTOLATE-PHENYLEPHRINE 0.2-1 % OP SOLN
1.0000 [drp] | OPHTHALMIC | Status: AC | PRN
  Administered 2019-02-16 (×3): 1 [drp] via OPHTHALMIC

## 2019-02-16 MED ORDER — LIDOCAINE HCL 3.5 % OP GEL
1.0000 "application " | Freq: Once | OPHTHALMIC | Status: AC
  Administered 2019-02-16: 1 via OPHTHALMIC

## 2019-02-16 MED ORDER — EPINEPHRINE PF 1 MG/ML IJ SOLN
INTRAMUSCULAR | Status: AC
  Filled 2019-02-16: qty 2

## 2019-02-16 MED ORDER — PHENYLEPHRINE HCL 2.5 % OP SOLN
1.0000 [drp] | OPHTHALMIC | Status: AC | PRN
  Administered 2019-02-16 (×3): 1 [drp] via OPHTHALMIC

## 2019-02-16 MED ORDER — BSS IO SOLN
INTRAOCULAR | Status: DC | PRN
  Administered 2019-02-16: 15 mL via INTRAOCULAR

## 2019-02-16 MED ORDER — EPINEPHRINE PF 1 MG/ML IJ SOLN
INTRAOCULAR | Status: DC | PRN
  Administered 2019-02-16: 500 mL

## 2019-02-16 MED ORDER — TETRACAINE HCL 0.5 % OP SOLN
1.0000 [drp] | OPHTHALMIC | Status: AC | PRN
  Administered 2019-02-16 (×3): 1 [drp] via OPHTHALMIC

## 2019-02-16 SURGICAL SUPPLY — 15 items
CLOTH BEACON ORANGE TIMEOUT ST (SAFETY) ×1 IMPLANT
EYE SHIELD UNIVERSAL CLEAR (GAUZE/BANDAGES/DRESSINGS) ×1 IMPLANT
GLOVE BIOGEL PI IND STRL 7.0 (GLOVE) IMPLANT
GLOVE BIOGEL PI INDICATOR 7.0 (GLOVE) ×2
LENS IOL TECNIS SYMFONY 24.0 ×1 IMPLANT
NDL HYPO 18GX1.5 BLUNT FILL (NEEDLE) IMPLANT
NEEDLE HYPO 18GX1.5 BLUNT FILL (NEEDLE) ×2 IMPLANT
PAD ARMBOARD 7.5X6 YLW CONV (MISCELLANEOUS) ×1 IMPLANT
PROC W SPEC LENS (INTRAOCULAR LENS) ×2
PROCESS W SPEC LENS (INTRAOCULAR LENS) IMPLANT
SYR TB 1ML LL NO SAFETY (SYRINGE) ×1 IMPLANT
TAPE SURG TRANSPORE 1 IN (GAUZE/BANDAGES/DRESSINGS) IMPLANT
TAPE SURGICAL TRANSPORE 1 IN (GAUZE/BANDAGES/DRESSINGS) ×1
VISCOELASTIC ADDITIONAL (OPHTHALMIC RELATED) ×1 IMPLANT
WATER STERILE IRR 250ML POUR (IV SOLUTION) ×1 IMPLANT

## 2019-02-16 NOTE — Discharge Instructions (Signed)
Please discharge patient when stable, will follow up today with Dr. Gareth Fitzner at the [facility] office immediately following discharge.  Leave shield in place until visit.  All paperwork with discharge instructions will be given at the office. ° °

## 2019-02-16 NOTE — Anesthesia Postprocedure Evaluation (Signed)
Anesthesia Post Note  Patient: [name]  Procedure(s) Performed: CATARACT EXTRACTION PHACO AND INTRAOCULAR LENS PLACEMENT LEFT EYE (CDE: 14.36) (Left Eye)  Patient location during evaluation: Short Stay Anesthesia Type: MAC Level of consciousness: awake Pain management: pain level controlled Vital Signs Assessment: post-procedure vital signs reviewed and stable Respiratory status: spontaneous breathing Cardiovascular status: stable Postop Assessment: no apparent nausea or vomiting and able to ambulate Anesthetic complications: no     Last Vitals:  Vitals:   02/16/19 1100  BP: (!) 166/85  Pulse: 76  Resp: 16  Temp: 36.9 C  SpO2: 98%    Last Pain:  Vitals:   02/16/19 1100  PainSc: 0-No pain                 [name]

## 2019-02-16 NOTE — Op Note (Signed)
Date of procedure: 02/16/19  Pre-operative diagnosis: Visually significant age-related cataract, Left Eye (H25.12)  Post-operative diagnosis: Visually significant age-related cataract, Left Eye  Procedure: Removal of cataract via phacoemulsification and insertion of intra-ocular lens [employer] ZXR00 +24.0D into the capsular bag of the Left Eye  Attending surgeon: Gerda Diss. Janei Scheff, MD, MA  Anesthesia: MAC, Topical Akten  Complications: None  Estimated Blood Loss: <28m (minimal)  Specimens: None  Implants: As above  Indications:  Visually significant age-related cataract, Left Eye  Procedure:  The patient was seen and identified in the pre-operative area. The operative eye was identified and dilated.  The operative eye was marked.  Topical anesthesia was administered to the operative eye.     The patient was then to the operative suite and placed in the supine position.  A timeout was performed confirming the patient, procedure to be performed, and all other relevant information.   The patient's face was prepped and draped in the usual fashion for intra-ocular surgery.  A lid speculum was placed into the operative eye and the surgical microscope moved into place and focused.  An inferotemporal paracentesis was created using a 20 gauge paracentesis blade.  Shugarcaine was injected into the anterior chamber.  Viscoelastic was injected into the anterior chamber.  A temporal clear-corneal main wound incision was created using a 2.45mmicrokeratome.  A continuous curvilinear capsulorrhexis was initiated using an irrigating cystitome and completed using capsulorrhexis forceps.  Hydrodissection and hydrodeliniation were performed.  Viscoelastic was injected into the anterior chamber.  A phacoemulsification handpiece and a chopper as a second instrument were used to remove the nucleus and epinucleus. The irrigation/aspiration handpiece was used to remove any remaining cortical  material.   The capsular bag was reinflated with viscoelastic, checked, and found to be intact.  The intraocular lens was inserted into the capsular bag.  The irrigation/aspiration handpiece was used to remove any remaining viscoelastic.  The clear corneal wound and paracentesis wounds were then hydrated and checked with Weck-Cels to be watertight.  The lid-speculum and drape was removed, and the patient's face was cleaned with a wet and dry 4x4.  A clear shield was taped over the eye. The patient was taken to the post-operative care unit in good condition, having tolerated the procedure well.  Post-Op Instructions: [facility] and will receive all other orders and instructions.

## 2019-02-16 NOTE — Anesthesia Preprocedure Evaluation (Addendum)
Anesthesia Evaluation  Patient identified by MRN, date of birth, ID band Patient awake    Reviewed: Allergy & Precautions, NPO status , Patient's Chart, lab work & pertinent test results, reviewed documented beta blocker date and time   History of Anesthesia Complications (+) PONV and history of anesthetic complications  Airway Mallampati: II  TM Distance: >3 FB Neck ROM: Full    Dental   Upper broken tooth:   Pulmonary    Pulmonary exam normal        Cardiovascular Exercise Tolerance: Good hypertension (took bystolic on 93/23/55), Pt. on medications and Pt. on home beta blockers Normal cardiovascular exam     Neuro/Psych  Headaches, PSYCHIATRIC DISORDERS Anxiety Depression TIA (right sided weakness, pateint is not sure whether her weaknessis from arthritis or TIA)   GI/Hepatic Neg liver ROS, GERD  Medicated, Controlled and Poorly Controlled,  Endo/Other  Hypothyroidism   Renal/GU negative Renal ROS     Musculoskeletal  (+) Arthritis , Osteoarthritis,    Abdominal   Peds  Hematology  (+) anemia ,   Anesthesia Other Findings   Reproductive/Obstetrics                            Anesthesia Physical Anesthesia Plan  ASA: III  Anesthesia Plan: MAC   Post-op Pain Management:    Induction:   PONV Risk Score and Plan:   Airway Management Planned: Natural Airway and Nasal Cannula  Additional Equipment:   Intra-op Plan:   Post-operative Plan:   Informed Consent: I have reviewed the patients History and Physical, chart, labs and discussed the procedure including the risks, benefits and alternatives for the proposed anesthesia with the patient or authorized representative who has indicated his/her understanding and acceptance.       Plan Discussed with: CRNA  Anesthesia Plan Comments:        Anesthesia Quick Evaluation

## 2019-02-16 NOTE — Interval H&P Note (Signed)
History and Physical Interval Note: The H and P was reviewed and updated. The patient was examined.  No changes were found after exam.  The surgical eye was marked.   02/16/2019 11:32 AM  [name]  has presented today for surgery, with the diagnosis of Nuclear sclerotic cataract - Left eye.  The various methods of treatment have been discussed with the patient and family. After consideration of risks, benefits and other options for treatment, the patient has consented to  Procedure(s) with comments: CATARACT EXTRACTION PHACO AND INTRAOCULAR LENS PLACEMENT (IOC) (Left) - Symphony Multifocal intraocular lens/left as a surgical intervention.  The patient's history has been reviewed, patient examined, no change in status, stable for surgery.  I have reviewed the patient's chart and labs.  Questions were answered to the patient's satisfaction.     [name]

## 2019-02-16 NOTE — Transfer of Care (Signed)
Immediate Anesthesia Transfer of Care Note  Patient: [name]  Procedure(s) Performed: CATARACT EXTRACTION PHACO AND INTRAOCULAR LENS PLACEMENT LEFT EYE (CDE: 14.36) (Left Eye)  Patient Location: Short Stay  Anesthesia Type:MAC  Level of Consciousness: awake  Airway & Oxygen Therapy: Patient Spontanous Breathing  Post-op Assessment: Report given to RN  Post vital signs: Reviewed and stable  Last Vitals:  Vitals Value Taken Time  BP    Temp    Pulse    Resp    SpO2      Last Pain:  Vitals:   02/16/19 1100  PainSc: 0-No pain         Complications: No apparent anesthesia complications

## 2019-02-17 ENCOUNTER — Encounter (HOSPITAL_COMMUNITY): Payer: Self-pay | Admitting: Ophthalmology

## 2019-02-17 NOTE — Addendum Note (Signed)
Addendum  created 02/17/19 0959 by Mickel Baas, CRNA   Charge Capture section accepted

## 2019-02-24 DIAGNOSIS — E039 Hypothyroidism, unspecified: Secondary | ICD-10-CM | POA: Diagnosis not present

## 2019-02-24 DIAGNOSIS — K219 Gastro-esophageal reflux disease without esophagitis: Secondary | ICD-10-CM | POA: Diagnosis not present

## 2019-02-24 DIAGNOSIS — I10 Essential (primary) hypertension: Secondary | ICD-10-CM | POA: Diagnosis not present

## 2019-02-24 DIAGNOSIS — E785 Hyperlipidemia, unspecified: Secondary | ICD-10-CM | POA: Diagnosis not present

## 2019-02-24 IMAGING — MR MR HEAD W/O CM
8 of 10 series · 41 of 48 positions shown · non-contrast
Comparison: Head CT 03/05/2017 and earlier.

CLINICAL DATA: 70 y/o female with 2 days of slurred speech and
weakness.

EXAM:
MRI HEAD WITHOUT CONTRAST
TECHNIQUE: Multiplanar, multiecho pulse sequences of the brain and surrounding
structures were obtained without intravenous contrast.

[Series 2: DWI · axial · 3.0mm · 0.69mm/px · z∈[-43,+117]mm · 7 of 56 slices shown (1 of 4)]
[im 1/56]
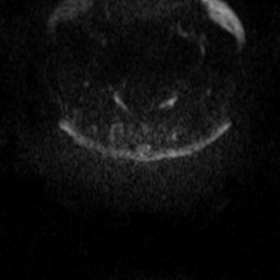
[im 10/56]
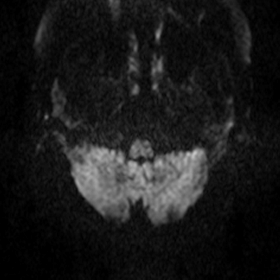
[im 19/56]
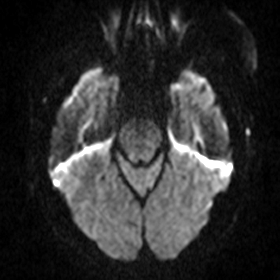
[im 28/56]
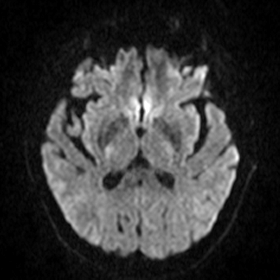
[im 37/56]
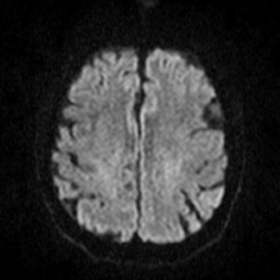
[im 46/56]
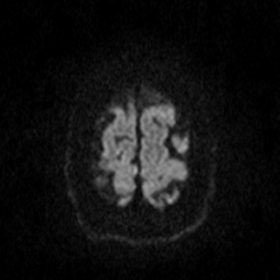
[im 56/56]
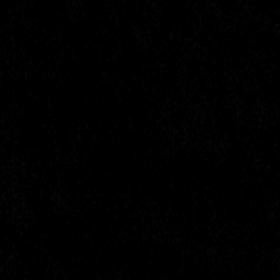

[Series 3: DWI · axial · 3.0mm · 0.76mm/px · z∈[-41,+113]mm · 6 of 53 slices shown (2 of 4)]
[im 1/53]
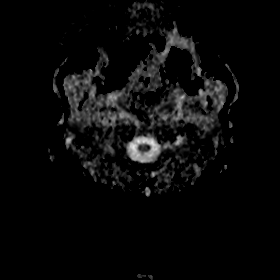
[im 11/53]
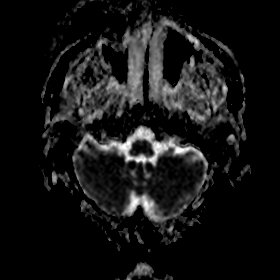
[im 21/53]
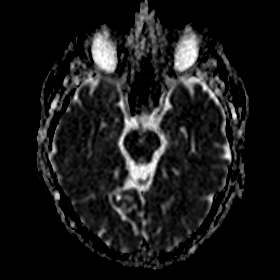
[im 32/53]
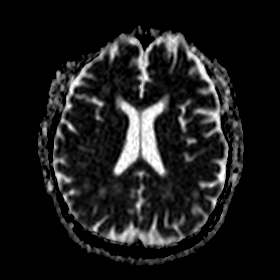
[im 42/53]
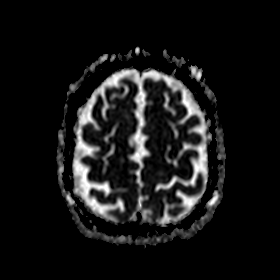
[im 53/53]
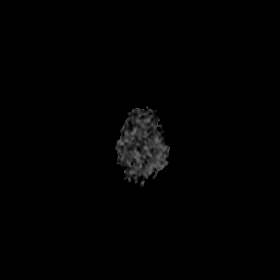

[Series 4: DWI · coronal · 5.0mm · 0.45mm/px · 4 of 34 slices shown (3 of 4)]
[im 1/34]
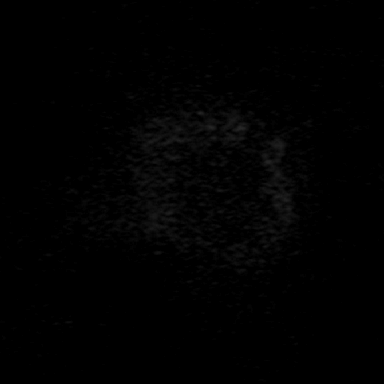
[im 12/34]
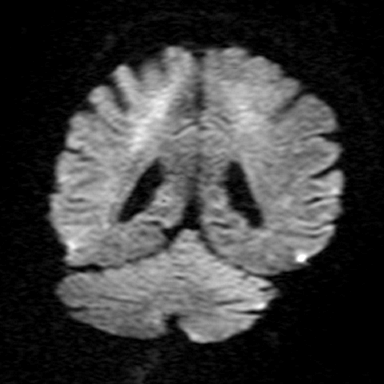
[im 23/34]
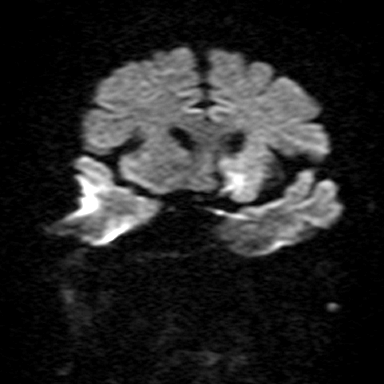
[im 34/34]
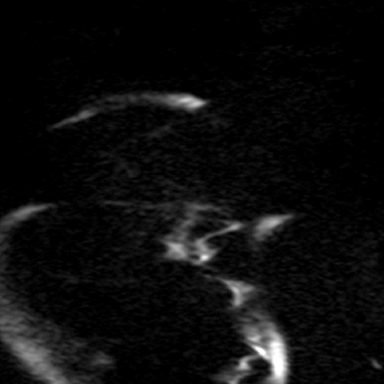

[Series 5: DWI · coronal · 5.0mm · 0.51mm/px · 4 of 34 slices shown (4 of 4)]
[im 1/34]
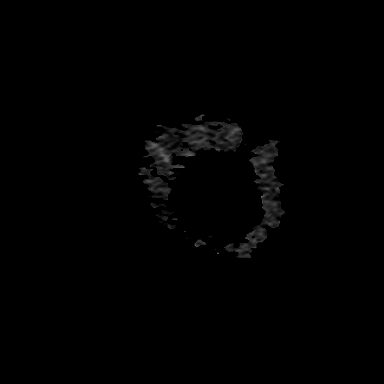
[im 12/34]
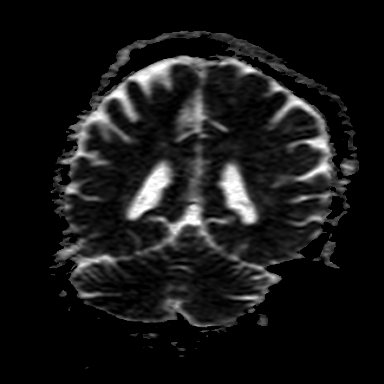
[im 23/34]
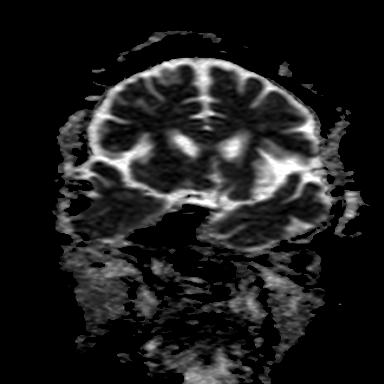
[im 34/34]
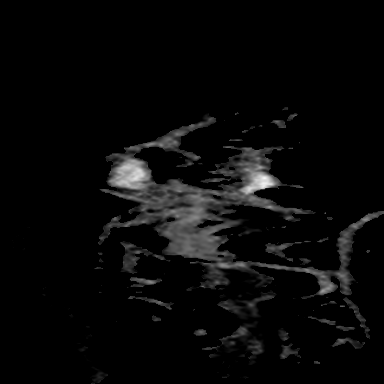

[Series 7: T2 · axial · 5.0mm · 0.62mm/px · z∈[-33,+108]mm · 3 of 23 slices shown (1 of 2)]
[im 1/23]
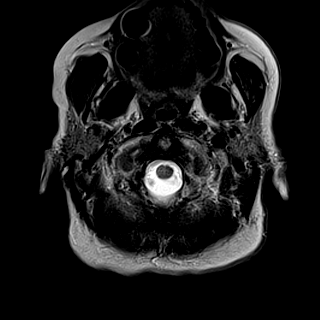
[im 12/23]
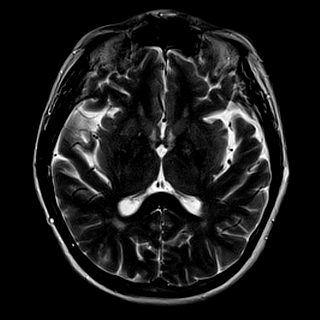
[im 23/23]
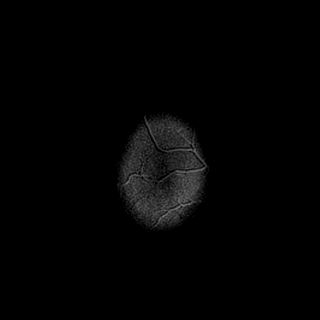

[Series 8: FLAIR · axial · 3.0mm · 0.81mm/px · z∈[-30,+106]mm · 6 of 47 slices shown]
[im 1/47]
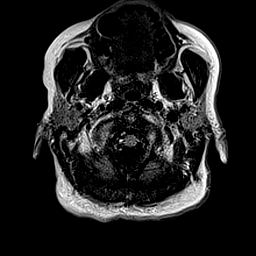
[im 10/47]
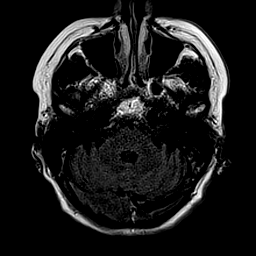
[im 19/47]
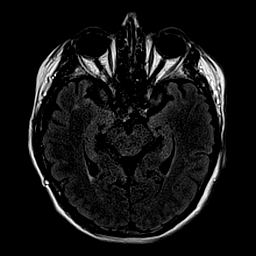
[im 28/47]
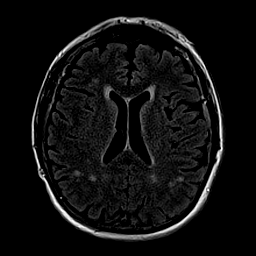
[im 37/47]
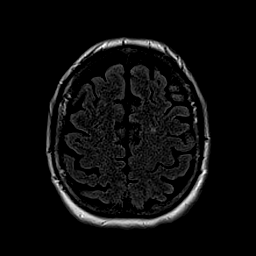
[im 47/47]
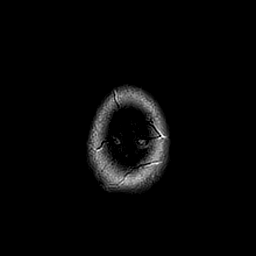

[Series 9: T1 · axial · 2.0mm · 0.40mm/px · z∈[-46,+109]mm · 8 of 80 slices shown]
[im 1/80]
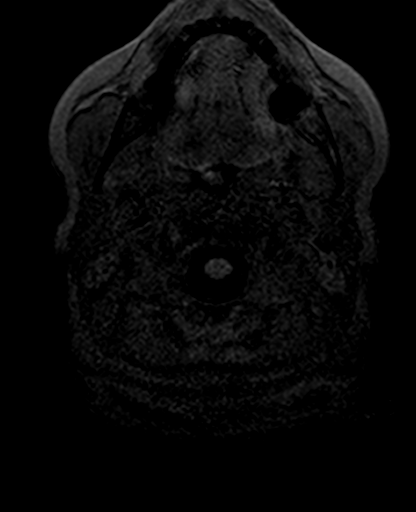
[im 9/80]
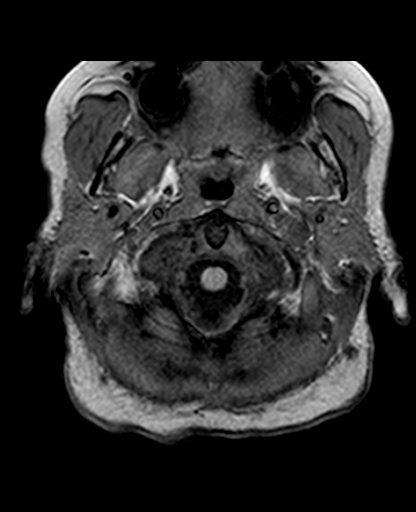
[im 27/80]
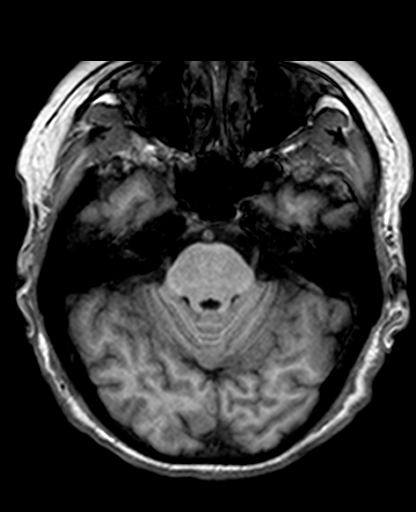
[im 36/80]
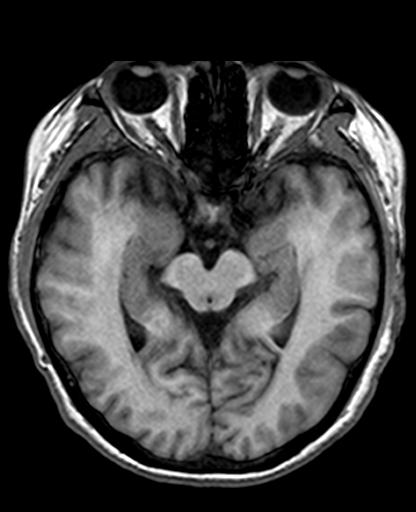
[im 44/80]
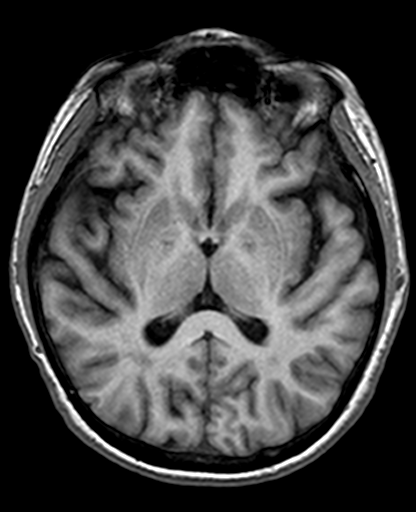
[im 53/80]
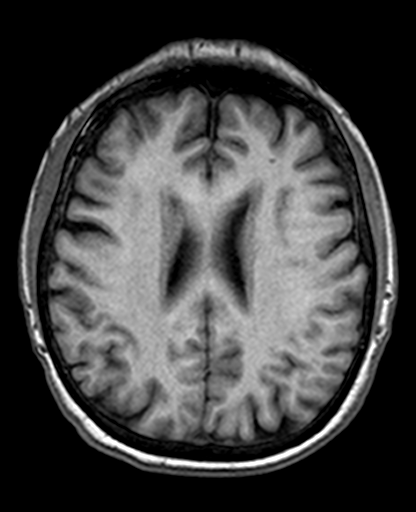
[im 71/80]
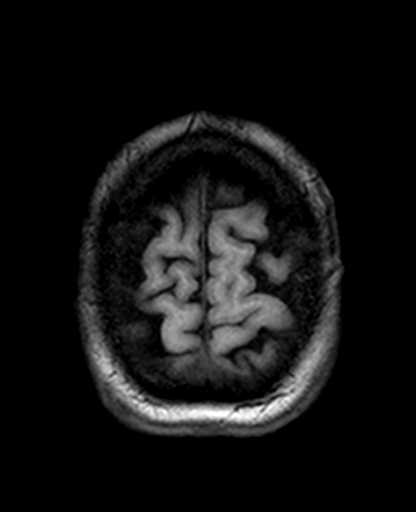
[im 80/80]
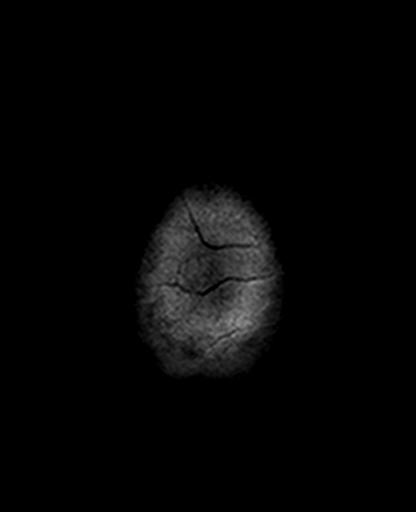

[Series 11: T2 · coronal · 5.0mm · 0.55mm/px · 3 of 28 slices shown (2 of 2)]
[im 1/28]
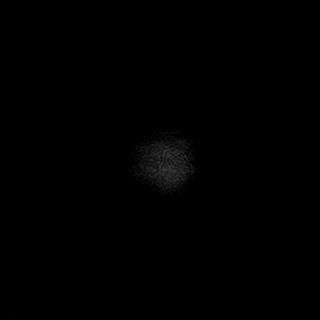
[im 14/28]
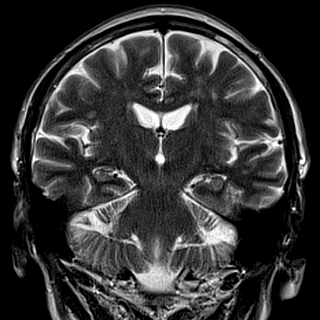
[im 28/28]
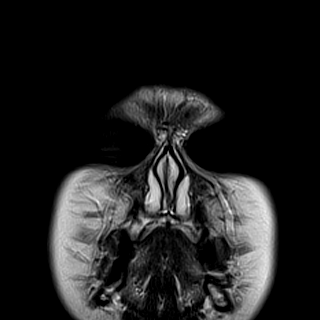

[41 of 48 positions shown; findings below may reference images not displayed]

FINDINGS: Brain: No restricted diffusion to suggest acute infarction. No
midline shift, mass effect, evidence of mass lesion,
ventriculomegaly, extra-axial collection or acute intracranial
hemorrhage. Cervicomedullary junction and pituitary are within
normal limits.

Cerebral volume is normal for age. Scattered small mostly central
and subcortical white matter foci of T2 and FLAIR hyperintensity in
both hemispheres. The configuration is nonspecific. The extent is
mild to moderate for age. No cortical encephalomalacia or chronic
cerebral blood products. Deep gray matter nuclei, brainstem and
cerebellum are normal for age.

Vascular: Major intracranial vascular flow voids are preserved.

Skull and upper cervical spine: Partially visible chronic C3-C4 disc
degeneration, possible C3 level degenerative spinal stenosis.

Sinuses/Orbits: Normal orbits soft tissues. Paranasal sinuses and
mastoids are stable and well pneumatized.

Other: Visible internal auditory structures appear normal. Scalp and
face soft tissues appear negative.
IMPRESSION: 1.  No acute intracranial abnormality.
2. Mild to moderate for age nonspecific cerebral white matter signal
changes, most commonly due to chronic small vessel disease.

## 2019-03-05 DIAGNOSIS — E039 Hypothyroidism, unspecified: Secondary | ICD-10-CM | POA: Diagnosis not present

## 2019-03-05 DIAGNOSIS — K219 Gastro-esophageal reflux disease without esophagitis: Secondary | ICD-10-CM | POA: Diagnosis not present

## 2019-03-05 DIAGNOSIS — I10 Essential (primary) hypertension: Secondary | ICD-10-CM | POA: Diagnosis not present

## 2019-03-05 DIAGNOSIS — E785 Hyperlipidemia, unspecified: Secondary | ICD-10-CM | POA: Diagnosis not present

## 2019-03-09 DIAGNOSIS — I10 Essential (primary) hypertension: Secondary | ICD-10-CM | POA: Diagnosis not present

## 2019-03-11 DIAGNOSIS — K219 Gastro-esophageal reflux disease without esophagitis: Secondary | ICD-10-CM | POA: Diagnosis not present

## 2019-03-11 DIAGNOSIS — I10 Essential (primary) hypertension: Secondary | ICD-10-CM | POA: Diagnosis not present

## 2019-03-11 DIAGNOSIS — E785 Hyperlipidemia, unspecified: Secondary | ICD-10-CM | POA: Diagnosis not present

## 2019-03-11 DIAGNOSIS — E039 Hypothyroidism, unspecified: Secondary | ICD-10-CM | POA: Diagnosis not present

## 2019-03-18 ENCOUNTER — Telehealth: Payer: Self-pay | Admitting: Cardiology

## 2019-03-18 NOTE — Telephone Encounter (Signed)
Patient called to report BP of 207/116 pulse 66 this AM. / tg

## 2019-03-18 NOTE — Telephone Encounter (Signed)
Spoke with [name] who states she has been having elevated BP's. She states that she has also called Dr. Juel Burrow office but has not received a call back. BP's are as follows 212/110 96,142/72 69 180/88 Please advise.

## 2019-03-19 NOTE — Telephone Encounter (Signed)
Have her take her HCTZ daily starting today and update [place] on Monday   Zandra Abts MD

## 2019-03-19 NOTE — Telephone Encounter (Signed)
Pt made aware. She will call Monday with an update.

## 2019-03-23 ENCOUNTER — Telehealth: Payer: Self-pay | Admitting: Cardiology

## 2019-03-23 NOTE — Telephone Encounter (Signed)
Returned pt call Blood pressures are as follows taking 1 hour after medication:AM/PM  Then pulse   10/15- 1725/92   73  174/93    76 10/16  143/74     66  220/111   71 10/17   160/80     75  193/91      70 10/18    168/90      69    203/101   75 10/19     160/85     77

## 2019-03-23 NOTE — Telephone Encounter (Signed)
Patient calling to report BP readings per JB request/tg

## 2019-03-24 MED ORDER — AMLODIPINE BESYLATE 5 MG PO TABS: 5.0000 mg | ORAL_TABLET | Freq: Every day | ORAL | 3 refills | Status: DC

## 2019-03-24 NOTE — Telephone Encounter (Signed)
Pt made aware. Sent in RX. She will update in 1 week .

## 2019-03-24 NOTE — Telephone Encounter (Signed)
BP's are too high, can she start norvasc 5mg  daily and update [place] in 1 week   J Karter Hellmer MD

## 2019-03-30 ENCOUNTER — Telehealth: Payer: Self-pay

## 2019-03-30 NOTE — Telephone Encounter (Signed)
Pt called in to report blood pressures:  After starting amlodipine-  10/21 166/83 71            154/69 74 10/22   138/72 76  168/87 75 10/23   121/64 75  147/84   68 10/24   138/90 79  133/66 76 10/25   146/80 79  154/75   74  10/26 144/72 76

## 2019-03-30 NOTE — Telephone Encounter (Signed)
BP's improving but still to high at times, increase norvasc to 10mg  daily. Update [place] again in 1 week, only needs to check bp once a day in the afternoon   J Alliana Mcauliff MD

## 2019-04-01 MED ORDER — AMLODIPINE BESYLATE 10 MG PO TABS: 10.0000 mg | ORAL_TABLET | Freq: Every day | ORAL | 3 refills | Status: DC

## 2019-04-01 NOTE — Addendum Note (Signed)
Addended by: Debbora Lacrosse R on: 04/01/2019 09:45 AM   Modules accepted: Orders

## 2019-04-01 NOTE — Telephone Encounter (Signed)
Pt made aware of Dr. Nelly Laurence recommendations. Voiced understanding. Sent new RX to [facility].

## 2019-04-06 NOTE — Telephone Encounter (Signed)
LMTCB-cc 

## 2019-04-06 NOTE — Telephone Encounter (Signed)
Returned pt call  

## 2019-04-06 NOTE — Telephone Encounter (Signed)
Please give pt a call concerning BP readings  

## 2019-04-06 NOTE — Telephone Encounter (Signed)
Returned pt call. She states that since increasing the medication (norvasc 10 mg) she has been very nauseas, feeling weak and having some sob. She states her blood pressure is better, but not sure she can tolerate the increased dose. She is willing to try taking it another week to let her body adjust.    Blood pressure are from 10/28- 11/2 138/64  68 121/50  64 143/68  70 128/78  76 131/68  68 129/70   77

## 2019-04-07 MED ORDER — HYDROCHLOROTHIAZIDE 12.5 MG PO CAPS: 12.5000 mg | ORAL_CAPSULE | Freq: Every day | ORAL | 3 refills | Status: DC

## 2019-04-07 NOTE — Telephone Encounter (Signed)
Patient will call back in 1 week

## 2019-04-07 NOTE — Addendum Note (Signed)
Addended by: Barbarann Ehlers A on: 04/07/2019 01:34 PM   Modules accepted: Orders

## 2019-04-07 NOTE — Telephone Encounter (Signed)
Have her update [place] in 1 week on symptoms and bps   Zandra Abts MD

## 2019-04-13 ENCOUNTER — Telehealth: Payer: Self-pay | Admitting: Cardiology

## 2019-04-13 DIAGNOSIS — K219 Gastro-esophageal reflux disease without esophagitis: Secondary | ICD-10-CM | POA: Diagnosis not present

## 2019-04-13 DIAGNOSIS — E039 Hypothyroidism, unspecified: Secondary | ICD-10-CM | POA: Diagnosis not present

## 2019-04-13 DIAGNOSIS — E785 Hyperlipidemia, unspecified: Secondary | ICD-10-CM | POA: Diagnosis not present

## 2019-04-13 DIAGNOSIS — I10 Essential (primary) hypertension: Secondary | ICD-10-CM | POA: Diagnosis not present

## 2019-04-13 MED ORDER — HYDROCHLOROTHIAZIDE 12.5 MG PO CAPS: 12.5000 mg | ORAL_CAPSULE | Freq: Every day | ORAL | 3 refills | Status: DC

## 2019-04-13 NOTE — Telephone Encounter (Signed)
BP readings: 138/82, 136/70,120/69, 121/69,141/79,133/73, 137/69

## 2019-04-13 NOTE — Telephone Encounter (Signed)
BP's are ok, she had some nausea on the higher dose norvasc. Has that resolved?  Zandra Abts MD

## 2019-04-13 NOTE — Telephone Encounter (Signed)
No c/o nausea but says she gets cramps throughout her whole body

## 2019-04-13 NOTE — Telephone Encounter (Signed)
Not clear any of her symptoms were due to the recent med change. The change is working well for her bp, would continue for now. If ongoing issues over the next few weeks can call us back   Zandra Abts MD

## 2019-04-13 NOTE — Telephone Encounter (Signed)
Calling to give BP Readings

## 2019-04-22 ENCOUNTER — Telehealth: Payer: Self-pay | Admitting: Cardiology

## 2019-04-22 NOTE — Telephone Encounter (Signed)
Pt is having some problems urinating and has to force herself to go, also having problems with constipation, and swelling in her feet. Patient  thinks it may be coming from the amLODipine (NORVASC) 10 MG tablet [id number]   Please call [facility] @ Dr. Durene Cal office @ [phone] to discuss changes or a different medication

## 2019-04-22 NOTE — Telephone Encounter (Signed)
Will forward to Dr. Branch. 

## 2019-04-22 NOTE — Telephone Encounter (Signed)
Returning call to [place]- [phone]

## 2019-04-23 NOTE — Telephone Encounter (Signed)
Hold norvac starting now and over the weekend, update [place] on symptoms on MOnday. Ok for higher bp's over the next few days   Zandra Abts MD

## 2019-04-24 NOTE — Telephone Encounter (Signed)
Patient will hold amlodipine and call us back on Monday with update.

## 2019-04-27 NOTE — Telephone Encounter (Signed)
Called pt. No answer, left message for pt to return call. Left detailed message on pt's private voicemail.

## 2019-04-27 NOTE — Telephone Encounter (Signed)
Please give pt a call concerning BP readings  

## 2019-04-27 NOTE — Telephone Encounter (Signed)
Can d/c norvasc, please list as allerygy. Bp looks fine, no alternative med needed at this time  Zandra Abts MD

## 2019-04-27 NOTE — Telephone Encounter (Signed)
Pt called to update on symptoms: She held Norvasc over weekend. She felt so much better overall. She stated she could go to the bathroom when needed and she is not as tired. Her blood pressure is: 125/74  74, 130/80  68, 135/70  71, 122/76  76

## 2019-05-05 ENCOUNTER — Other Ambulatory Visit: Payer: Self-pay

## 2019-05-05 DIAGNOSIS — Z20822 Contact with and (suspected) exposure to covid-19: Secondary | ICD-10-CM

## 2019-05-07 DIAGNOSIS — E785 Hyperlipidemia, unspecified: Secondary | ICD-10-CM | POA: Diagnosis not present

## 2019-05-07 DIAGNOSIS — I10 Essential (primary) hypertension: Secondary | ICD-10-CM | POA: Diagnosis not present

## 2019-05-07 DIAGNOSIS — E039 Hypothyroidism, unspecified: Secondary | ICD-10-CM | POA: Diagnosis not present

## 2019-05-07 DIAGNOSIS — E782 Mixed hyperlipidemia: Secondary | ICD-10-CM | POA: Diagnosis not present

## 2019-05-07 DIAGNOSIS — R944 Abnormal results of kidney function studies: Secondary | ICD-10-CM | POA: Diagnosis not present

## 2019-05-07 LAB — NOVEL CORONAVIRUS, NAA: SARS-CoV-2, NAA: NOT DETECTED

## 2019-05-13 DIAGNOSIS — M1990 Unspecified osteoarthritis, unspecified site: Secondary | ICD-10-CM | POA: Diagnosis not present

## 2019-05-13 DIAGNOSIS — Z23 Encounter for immunization: Secondary | ICD-10-CM | POA: Diagnosis not present

## 2019-05-13 DIAGNOSIS — K219 Gastro-esophageal reflux disease without esophagitis: Secondary | ICD-10-CM | POA: Diagnosis not present

## 2019-05-13 DIAGNOSIS — E782 Mixed hyperlipidemia: Secondary | ICD-10-CM | POA: Diagnosis not present

## 2019-05-13 DIAGNOSIS — I10 Essential (primary) hypertension: Secondary | ICD-10-CM | POA: Diagnosis not present

## 2019-05-13 DIAGNOSIS — R05 Cough: Secondary | ICD-10-CM | POA: Diagnosis not present

## 2019-05-13 DIAGNOSIS — E871 Hypo-osmolality and hyponatremia: Secondary | ICD-10-CM | POA: Diagnosis not present

## 2019-05-13 DIAGNOSIS — F331 Major depressive disorder, recurrent, moderate: Secondary | ICD-10-CM | POA: Diagnosis not present

## 2019-05-13 DIAGNOSIS — E875 Hyperkalemia: Secondary | ICD-10-CM | POA: Diagnosis not present

## 2019-05-13 DIAGNOSIS — E039 Hypothyroidism, unspecified: Secondary | ICD-10-CM | POA: Diagnosis not present

## 2019-05-18 DIAGNOSIS — K219 Gastro-esophageal reflux disease without esophagitis: Secondary | ICD-10-CM | POA: Diagnosis not present

## 2019-05-18 DIAGNOSIS — E785 Hyperlipidemia, unspecified: Secondary | ICD-10-CM | POA: Diagnosis not present

## 2019-05-18 DIAGNOSIS — I10 Essential (primary) hypertension: Secondary | ICD-10-CM | POA: Diagnosis not present

## 2019-05-18 DIAGNOSIS — E039 Hypothyroidism, unspecified: Secondary | ICD-10-CM | POA: Diagnosis not present

## 2019-06-08 DIAGNOSIS — E039 Hypothyroidism, unspecified: Secondary | ICD-10-CM | POA: Diagnosis not present

## 2019-06-08 DIAGNOSIS — E785 Hyperlipidemia, unspecified: Secondary | ICD-10-CM | POA: Diagnosis not present

## 2019-06-08 DIAGNOSIS — K219 Gastro-esophageal reflux disease without esophagitis: Secondary | ICD-10-CM | POA: Diagnosis not present

## 2019-06-08 DIAGNOSIS — I10 Essential (primary) hypertension: Secondary | ICD-10-CM | POA: Diagnosis not present

## 2019-06-22 ENCOUNTER — Other Ambulatory Visit: Payer: Self-pay

## 2019-06-22 ENCOUNTER — Encounter (HOSPITAL_COMMUNITY): Payer: Self-pay | Admitting: Emergency Medicine

## 2019-06-22 ENCOUNTER — Emergency Department (HOSPITAL_COMMUNITY): Payer: PPO

## 2019-06-22 ENCOUNTER — Observation Stay (HOSPITAL_COMMUNITY)
Admission: EM | Admit: 2019-06-22 | Discharge: 2019-06-23 | Disposition: A | Discharge status: Home / Self Care | Payer: PPO | Attending: Family Medicine | Admitting: Family Medicine

## 2019-06-22 DIAGNOSIS — U071 COVID-19: Secondary | ICD-10-CM | POA: Diagnosis not present

## 2019-06-22 DIAGNOSIS — Z96653 Presence of artificial knee joint, bilateral: Secondary | ICD-10-CM | POA: Insufficient documentation

## 2019-06-22 DIAGNOSIS — E871 Hypo-osmolality and hyponatremia: Secondary | ICD-10-CM | POA: Diagnosis not present

## 2019-06-22 DIAGNOSIS — R4701 Aphasia: Secondary | ICD-10-CM | POA: Diagnosis not present

## 2019-06-22 DIAGNOSIS — E032 Hypothyroidism due to medicaments and other exogenous substances: Secondary | ICD-10-CM | POA: Diagnosis not present

## 2019-06-22 DIAGNOSIS — I6623 Occlusion and stenosis of bilateral posterior cerebral arteries: Secondary | ICD-10-CM | POA: Diagnosis not present

## 2019-06-22 DIAGNOSIS — Z8673 Personal history of transient ischemic attack (TIA), and cerebral infarction without residual deficits: Secondary | ICD-10-CM | POA: Insufficient documentation

## 2019-06-22 DIAGNOSIS — Z79899 Other long term (current) drug therapy: Secondary | ICD-10-CM | POA: Diagnosis not present

## 2019-06-22 DIAGNOSIS — Z96643 Presence of artificial hip joint, bilateral: Secondary | ICD-10-CM | POA: Diagnosis not present

## 2019-06-22 DIAGNOSIS — G459 Transient cerebral ischemic attack, unspecified: Secondary | ICD-10-CM

## 2019-06-22 DIAGNOSIS — R479 Unspecified speech disturbances: Secondary | ICD-10-CM | POA: Diagnosis present

## 2019-06-22 DIAGNOSIS — F329 Major depressive disorder, single episode, unspecified: Secondary | ICD-10-CM | POA: Insufficient documentation

## 2019-06-22 DIAGNOSIS — R29818 Other symptoms and signs involving the nervous system: Secondary | ICD-10-CM | POA: Diagnosis not present

## 2019-06-22 DIAGNOSIS — E785 Hyperlipidemia, unspecified: Secondary | ICD-10-CM | POA: Diagnosis not present

## 2019-06-22 DIAGNOSIS — F419 Anxiety disorder, unspecified: Secondary | ICD-10-CM | POA: Diagnosis not present

## 2019-06-22 DIAGNOSIS — K219 Gastro-esophageal reflux disease without esophagitis: Secondary | ICD-10-CM | POA: Insufficient documentation

## 2019-06-22 DIAGNOSIS — F32A Depression, unspecified: Secondary | ICD-10-CM | POA: Diagnosis present

## 2019-06-22 DIAGNOSIS — E039 Hypothyroidism, unspecified: Secondary | ICD-10-CM | POA: Diagnosis present

## 2019-06-22 DIAGNOSIS — I10 Essential (primary) hypertension: Secondary | ICD-10-CM | POA: Diagnosis not present

## 2019-06-22 DIAGNOSIS — Z79890 Hormone replacement therapy (postmenopausal): Secondary | ICD-10-CM | POA: Insufficient documentation

## 2019-06-22 LAB — COMPREHENSIVE METABOLIC PANEL
ALT: 16 U/L (ref 0–44)
AST: 22 U/L (ref 15–41)
Albumin: 4.1 g/dL (ref 3.5–5.0)
Alkaline Phosphatase: 96 U/L (ref 38–126)
Anion gap: 10 (ref 5–15)
BUN: 14 mg/dL (ref 8–23)
CO2: 25 mmol/L (ref 22–32)
Calcium: 8.9 mg/dL (ref 8.9–10.3)
Chloride: 87 mmol/L — ABNORMAL LOW (ref 98–111)
Creatinine, Ser: 0.88 mg/dL (ref 0.44–1.00)
GFR calc Af Amer: >60 mL/min (ref >60)
GFR calc non Af Amer: >60 mL/min (ref >60)
Glucose, Bld: 106 mg/dL — ABNORMAL HIGH (ref 70–99)
Potassium: 3.5 mmol/L (ref 3.5–5.1)
Sodium: 122 mmol/L — ABNORMAL LOW (ref 135–145)
Total Bilirubin: 0.6 mg/dL (ref 0.3–1.2)
Total Protein: 6.8 g/dL (ref 6.5–8.1)

## 2019-06-22 LAB — CBC
HCT: 38.2 % (ref 36.0–46.0)
Hemoglobin: 13.1 g/dL (ref 12.0–15.0)
MCH: 30.5 pg (ref 26.0–34.0)
MCHC: 34.3 g/dL (ref 30.0–36.0)
MCV: 88.8 fL (ref 80.0–100.0)
Platelets: 284 10*3/uL (ref 150–400)
RBC: 4.3 MIL/uL (ref 3.87–5.11)
RDW: 12.6 % (ref 11.5–15.5)
WBC: 5 10*3/uL (ref 4.0–10.5)
nRBC: 0 % (ref 0.0–0.2)

## 2019-06-22 LAB — RESPIRATORY PANEL BY RT PCR (FLU A&B, COVID)
Influenza A by PCR: NEGATIVE
Influenza B by PCR: NEGATIVE
SARS Coronavirus 2 by RT PCR: POSITIVE — AB

## 2019-06-22 LAB — PROTIME-INR
INR: 1 (ref 0.8–1.2)
Prothrombin Time: 13 seconds (ref 11.4–15.2)

## 2019-06-22 LAB — I-STAT CHEM 8, ED
BUN: 13 mg/dL (ref 8–23)
Calcium, Ion: 1.13 mmol/L — ABNORMAL LOW (ref 1.15–1.40)
Chloride: 88 mmol/L — ABNORMAL LOW (ref 98–111)
Creatinine, Ser: 1 mg/dL (ref 0.44–1.00)
Glucose, Bld: 105 mg/dL — ABNORMAL HIGH (ref 70–99)
HCT: 41 % (ref 36.0–46.0)
Hemoglobin: 13.9 g/dL (ref 12.0–15.0)
Potassium: 3.6 mmol/L (ref 3.5–5.1)
Sodium: 124 mmol/L — ABNORMAL LOW (ref 135–145)
TCO2: 25 mmol/L (ref 22–32)

## 2019-06-22 LAB — DIFFERENTIAL
Abs Immature Granulocytes: 0.01 10*3/uL (ref 0.00–0.07)
Basophils Absolute: 0 10*3/uL (ref 0.0–0.1)
Basophils Relative: 1 %
Eosinophils Absolute: 0.2 10*3/uL (ref 0.0–0.5)
Eosinophils Relative: 4 %
Immature Granulocytes: 0 %
Lymphocytes Relative: 37 %
Lymphs Abs: 1.8 10*3/uL (ref 0.7–4.0)
Monocytes Absolute: 0.5 10*3/uL (ref 0.1–1.0)
Monocytes Relative: 11 %
Neutro Abs: 2.4 10*3/uL (ref 1.7–7.7)
Neutrophils Relative %: 47 %

## 2019-06-22 LAB — ETHANOL: Alcohol, Ethyl (B): <10 mg/dL (ref <10)

## 2019-06-22 LAB — APTT: aPTT: 33 seconds (ref 24–36)

## 2019-06-22 MED ORDER — ALTEPLASE 100 MG IV SOLR
INTRAVENOUS | Status: AC
  Filled 2019-06-22: qty 100

## 2019-06-22 MED ORDER — IOHEXOL 350 MG/ML SOLN
150.0000 mL | Freq: Once | INTRAVENOUS | Status: AC | PRN
  Administered 2019-06-22: 130 mL via INTRAVENOUS

## 2019-06-22 NOTE — ED Provider Notes (Signed)
[facility] EMERGENCY DEPARTMENT Provider Note   CSN: [id number] Arrival date & time: 06/22/19  2018  An emergency department physician performed an initial assessment on this suspected stroke patient at 2036.  History Chief Complaint  Patient presents with  . Code Stroke    [name] is a 74 y.o. female.  HPI   This 74 year old female presents in the company of her daughter who drives her to the hospital after acute onset of difficulty speaking.  This patient has a history of a TIA a couple of years ago with very similar symptoms for which she had a stroke work-up at that time.  She had been in her usual state of health until tonight at 7:50 PM when she stood up from the dinner table and started to speak abnormally using words incorrectly and answering questions incorrectly.  This was very similar to what she had undergone a couple of years ago.  There was no reports of any recent symptoms except for an occasional cough however no fevers vomiting or diarrhea.  The daughter is the primary historian given that the patient has an expressive and receptive aphasia.  A level 5 caveat applies.  Again last seen normal was 7:50 PM.  I saw the patient on arrival at approximately 8:30 PM and activated a code stroke immediately.  The patient went immediately to CT scan.  Past Medical History:  Diagnosis Date  . Anemia   . Anxiety   . Arthritis   . Complication of anesthesia    "pt. holds her breath when waking up"  . Depression   . GERD (gastroesophageal reflux disease)   . Hypertension   . Hypothyroidism   . PONV (postoperative nausea and vomiting)   . Staph infection    abdomen and returned 5 yrs. later    Patient Active Problem List   Diagnosis Date Noted  . Transient speech disturbance 06/22/2019  . Migraine equivalent 04/16/2017  . Anxiety 04/16/2017  . Palpitation 04/16/2017  . Hyperlipidemia 04/16/2017  . Essential hypertension 03/06/2017  . Hypokalemia 03/06/2017  .  Hyponatremia 03/06/2017  . TIA (transient ischemic attack) 03/05/2017  . Primary localized osteoarthritis of left knee 08/22/2016  . Paresthesia 08/10/2016  . Weakness 08/10/2016  . Depression 07/31/2016  . S/P thyroidectomy 07/31/2016  . Primary osteoarthritis of left knee 07/31/2016  . S/P cervical spinal fusion 07/31/2016  . S/P total knee arthroplasty, right 07/31/2016  . SUBLUXATION, RADIAL HEAD 10/27/2009  . CONTUSION, ELBOW 10/27/2009    Past Surgical History:  Procedure Laterality Date  . ABDOMINAL HYSTERECTOMY    . CATARACT EXTRACTION W/PHACO Right 01/22/2019   Procedure: CATARACT EXTRACTION PHACO AND INTRAOCULAR LENS PLACEMENT (IOC);  Surgeon: [name], MD;  Location: [facility];  Service: Ophthalmology;  Laterality: Right;  right, CDE: 19.63  . CATARACT EXTRACTION W/PHACO Left 02/16/2019   Procedure: CATARACT EXTRACTION PHACO AND INTRAOCULAR LENS PLACEMENT LEFT EYE (CDE: 14.36);  Surgeon: [name], MD;  Location: [facility];  Service: Ophthalmology;  Laterality: Left;  . CERVICAL FUSION  1990's  . COLON SURGERY    . FOOT SURGERY Left    reshaped  . HERNIA REPAIR    . HIP ARTHROPLASTY Left   . JOINT REPLACEMENT Right    hip  . LUMBAR SPINE SURGERY    . REPLACEMENT TOTAL KNEE Right   . THYROID SURGERY    . TOTAL KNEE ARTHROPLASTY Left 08/21/2016   Procedure: LEFT TOTAL KNEE ARTHROPLASTY;  Surgeon: Renette Butters, MD;  Location: [facility]  OR;  Service: Orthopedics;  Laterality: Left;     OB History   No obstetric history on file.     No family history on file.  Social History   Tobacco Use  . Smoking status: Never Smoker  . Smokeless tobacco: Never Used  Substance Use Topics  . Alcohol use: No  . Drug use: No    Home Medications Prior to Admission medications   Medication Sig Start Date End Date Taking? Authorizing Provider  amLODipine (NORVASC) 10 MG tablet Take 1 tablet (10 mg total) by mouth daily. 04/01/19 06/30/19  Arnoldo Lenis, MD  diclofenac  (VOLTAREN) 75 MG EC tablet Take 75 mg by mouth 2 (two) times daily. 08/20/18   [provider]  DULoxetine (CYMBALTA) 30 MG capsule Take 30 mg by mouth daily. 06/28/16   [provider]  hydrochlorothiazide (MICROZIDE) 12.5 MG capsule Take 1 capsule (12.5 mg total) by mouth daily. 04/13/19   Arnoldo Lenis, MD  levothyroxine (SYNTHROID, LEVOTHROID) 75 MCG tablet Take 75 mcg by mouth daily before breakfast. 06/01/16   [provider]  losartan (COZAAR) 50 MG tablet Take 50 mg by mouth at bedtime. 03/09/19   [provider]  nebivolol (BYSTOLIC) 10 MG tablet Take 10 mg by mouth at bedtime.     [provider]  omeprazole (PRILOSEC) 20 MG capsule Take 1 capsule (20 mg total) by mouth daily. While taking anti inflammatory medicine daily 08/21/16   Prudencio Burly III, PA-C  rosuvastatin (CRESTOR) 20 MG tablet Take 20 mg by mouth daily.  09/19/18   [provider]    Allergies    Sulfamethoxazole and Sulfonamide derivatives  Review of Systems   Review of Systems  Unable to perform ROS: Acuity of condition    Physical Exam Updated Vital Signs BP (!) 179/99   Pulse 91   Temp 98.3 F (36.8 C)   Resp 12   Ht 1.524 m (5')   Wt 89 kg   SpO2 99%   BMI 38.30 kg/m   Physical Exam Vitals and nursing note reviewed.  Constitutional:      General: She is in acute distress.     Appearance: She is well-developed. She is ill-appearing.  HENT:     Head: Normocephalic and atraumatic.     Mouth/Throat:     Pharynx: No oropharyngeal exudate.  Eyes:     General: No scleral icterus.       Right eye: No discharge.        Left eye: No discharge.     Conjunctiva/sclera: Conjunctivae normal.     Pupils: Pupils are equal, round, and reactive to light.  Neck:     Thyroid: No thyromegaly.     Vascular: No JVD.     Comments: No carotid bruits auscultated Cardiovascular:     Rate and Rhythm: Normal rate and regular rhythm.     Heart sounds:  Normal heart sounds. No murmur. No friction rub. No gallop.   Pulmonary:     Effort: Pulmonary effort is normal. No respiratory distress.     Breath sounds: Normal breath sounds. No wheezing or rales.  Abdominal:     General: Bowel sounds are normal. There is no distension.     Palpations: Abdomen is soft. There is no mass.     Tenderness: There is no abdominal tenderness.  Musculoskeletal:        General: No tenderness. Normal range of motion.     Cervical back: Normal range  of motion and neck supple.  Lymphadenopathy:     Cervical: No cervical adenopathy.  Skin:    General: Skin is warm and dry.     Findings: No erythema or rash.  Neurological:     Mental Status: She is alert.     Coordination: Coordination normal.     Comments: Speech is clear, cranial nerves III through XII are intact, memory is intact, strength is normal in all 4 extremities including grips, sensation is intact to light touch and pinprick in all 4 extremities. Coordination as tested by finger-nose-finger is unable to be tested due to the patient's inability to follow commands correctly, however she does not appear to have any asterixis or tremor, no limb ataxia. Normal gait, normal reflexes at the patellar tendons bilaterally.  The patient has both an expressive and receptive aphasia.  Psychiatric:        Behavior: Behavior normal.     ED Results / Procedures / Treatments   Labs (all labs ordered are listed, but only abnormal results are displayed) Labs Reviewed  COMPREHENSIVE METABOLIC PANEL - Abnormal; Notable for the following components:      Result Value   Sodium 122 (*)    Chloride 87 (*)    Glucose, Bld 106 (*)    All other components within normal limits  I-STAT CHEM 8, ED - Abnormal; Notable for the following components:   Sodium 124 (*)    Chloride 88 (*)    Glucose, Bld 105 (*)    Calcium, Ion 1.13 (*)    All other components within normal limits  SARS CORONAVIRUS 2 (TAT 6-24 HRS)  ETHANOL    PROTIME-INR  APTT  CBC  DIFFERENTIAL  RAPID URINE DRUG SCREEN, HOSP PERFORMED  URINALYSIS, ROUTINE W REFLEX MICROSCOPIC  SODIUM, URINE, RANDOM  OSMOLALITY, URINE  TSH    EKG EKG Interpretation  Date/Time:  Monday June 22 2019 20:36:42 EST Ventricular Rate:  83 PR Interval:    QRS Duration: 130 QT Interval:  411 QTC Calculation: 483 R Axis:   -51 Text Interpretation: Sinus rhythm Supraventricular bigeminy Borderline prolonged PR interval LVH with IVCD, LAD and secondary repol abnrm Probable RV involvement, suggest recording right precordial leads Baseline wander in lead(s) I II III aVL aVF V1 V2 V5 V6 Confirmed by Noemi Chapel [phone]) on 06/22/2019 8:45:59 PM   Radiology CT HEAD CODE STROKE WO CONTRAST  Result Date: 06/22/2019 CLINICAL DATA:  Code stroke. EXAM: CT HEAD WITHOUT CONTRAST TECHNIQUE: Contiguous axial images were obtained from the base of the skull through the vertex without intravenous contrast. COMPARISON:  03/05/2017 FINDINGS: Brain: There is no acute intracranial hemorrhage, mass effect, or edema. Gray-white differentiation is preserved. Patchy areas of hypoattenuation in the supratentorial white matter are nonspecific but may reflect mild to moderate chronic microvascular ischemic changes, which appear similar to the prior study. Ventricles and sulci are within normal limits in size and configuration. There is no extra-axial fluid collection. Vascular: There is no hyperdense vessel. Intracranial atherosclerotic calcification is present at the skull base. Skull: Calvarium is unremarkable. Sinuses/Orbits: No acute abnormality. Other: None. ASPECTS ([place] Stroke Program Early CT Score) - Ganglionic level infarction (caudate, lentiform nuclei, internal capsule, insula, M1-M3 cortex): 7 - Supraganglionic infarction (M4-M6 cortex): 3 Total score (0-10 with 10 being normal): 10 IMPRESSION: No acute intracranial hemorrhage or evidence of acute infarction. ASPECT score is 10.  Chronic microvascular ischemic changes. These results were called by telephone at the time of interpretation on 06/22/2019 at 8:51 pm  to provider Noemi Chapel , who verbally acknowledged these results. Electronically Signed   By: Macy Mis M.D.   On: 06/22/2019 20:54    Procedures Procedures (including critical care time)  Medications Ordered in ED Medications  alteplase (ACTIVASE) 1 mg/mL injection (  Not Given 06/22/19 2110)  iohexol (OMNIPAQUE) 350 MG/ML injection 150 mL (130 mLs Intravenous Contrast Given 06/22/19 2110)    ED Course  I have reviewed the triage vital signs and the nursing notes.  Pertinent labs & imaging results that were available during my care of the patient were reviewed by me and considered in my medical decision making (see chart for details).    MDM Rules/Calculators/A&P                      Discussed the case with the neurologic teleneurology nurse at the bedside, they will prepare for evaluation when the patient returns from CT scan.  EKG shows left ventricular hypertrophy, no findings of ischemia or significant arrhythmia, it is sinus rhythm.  I discussed these findings with the radiologist who confirms that there is no acute findings on the CT scan of hemorrhage or acute stroke.  I discussed the findings with the teleneurologist who agrees that the patient has improved slowly over time and at this time is now able to speak more clearly and recommends against thrombolytic therapy.  I discussed the case with the hospitalist, Dr. Myna Hidalgo who is willing to admit the patient to the hospital appreciative of his cooperative efforts.  8:44 PM Cardiac monitoring reveals sinus rhythm with occasional ectopy (Rate & rhythm), as reviewed and interpreted by me. Cardiac monitoring was ordered due to acute stroke and to monitor patient for dysrhythmia.  ANGELINE MOND was evaluated in Emergency Department on 06/22/2019 for the symptoms described in the history of present  illness. She was evaluated in the context of the global COVID-19 pandemic, which necessitated consideration that the patient might be at risk for infection with the SARS-CoV-2 virus that causes COVID-19. Institutional protocols and algorithms that pertain to the evaluation of patients at risk for COVID-19 are in a state of rapid change based on information released by regulatory bodies including the CDC and federal and state organizations. These policies and algorithms were followed during the patient's care in the ED.   Final Clinical Impression(s) / ED Diagnoses Final diagnoses:  TIA (transient ischemic attack)  Hyponatremia    Rx / DC Orders ED Discharge Orders    None       Noemi Chapel, MD 06/22/19 2139

## 2019-06-22 NOTE — H&P (Addendum)
History and Physical    [name] [id number] DOB: 06-22-1945 DOA: 06/22/2019  PCP: [name], MD   Patient coming from: Home   Chief Complaint: Speech difficulty   HPI: [name] is a 74 y.o. female with medical history significant for hypertension, hypothyroidism, and depression, now presenting to the emergency department with acute onset of speech difficulty.  Patient reports that she was in her usual state of health and having an uneventful day until shortly after eating dinner, shortly before 8 PM, she developed acute onset of speech difficulty, described as difficulty finding the right words to use, but she also reports some difficulty understanding her family when they were speaking to her.  There was no headache, focal numbness, or focal weakness associated with this but she did have transient vision disturbance in her right eye that resolved quickly.  Symptoms began to improve in the ED and have nearly resolved by time of admission.  She denies any recent fevers or chills.  She thought that she had COVID-19 in November due to cough, aches, malaise, and family members with confirmed COVID-19, but her test was negative at that time.  Those viral symptoms she was having in November have essentially resolved except for mild residual nonproductive cough.  She denies chest pain or palpitations.  ED Course: Upon arrival to the ED, patient is found to be afebrile, saturating well on room air, and hypertensive to 180/100.  EKG features sinus rhythm with PVCs and LVH with IVCD.  Noncontrast head CT is negative for acute findings.  Chemistry panel notable for sodium of 122 and CBC unremarkable.  Ethanol level undetectable.  Covid PCR screening test is ordered but not yet resulted.  Teleneurology evaluated the patient in the ED and recommends admission for additional work-up and hospitalists are consulted for admission.  Review of Systems:  All other systems reviewed and apart from  HPI, are negative.  Past Medical History:  Diagnosis Date  . Anemia   . Anxiety   . Arthritis   . Complication of anesthesia    "pt. holds her breath when waking up"  . Depression   . GERD (gastroesophageal reflux disease)   . Hypertension   . Hypothyroidism   . PONV (postoperative nausea and vomiting)   . Staph infection    abdomen and returned 5 yrs. later    Past Surgical History:  Procedure Laterality Date  . ABDOMINAL HYSTERECTOMY    . CATARACT EXTRACTION W/PHACO Right 01/22/2019   Procedure: CATARACT EXTRACTION PHACO AND INTRAOCULAR LENS PLACEMENT (IOC);  Surgeon: [name], MD;  Location: [facility];  Service: Ophthalmology;  Laterality: Right;  right, CDE: 19.63  . CATARACT EXTRACTION W/PHACO Left 02/16/2019   Procedure: CATARACT EXTRACTION PHACO AND INTRAOCULAR LENS PLACEMENT LEFT EYE (CDE: 14.36);  Surgeon: [name], MD;  Location: [facility];  Service: Ophthalmology;  Laterality: Left;  . CERVICAL FUSION  1990's  . COLON SURGERY    . FOOT SURGERY Left    reshaped  . HERNIA REPAIR    . HIP ARTHROPLASTY Left   . JOINT REPLACEMENT Right    hip  . LUMBAR SPINE SURGERY    . REPLACEMENT TOTAL KNEE Right   . THYROID SURGERY    . TOTAL KNEE ARTHROPLASTY Left 08/21/2016   Procedure: LEFT TOTAL KNEE ARTHROPLASTY;  Surgeon: Renette Butters, MD;  Location: [facility];  Service: Orthopedics;  Laterality: Left;     reports that she has never smoked. She has never used smokeless  tobacco. She reports that she does not drink alcohol or use drugs.  Allergies  Allergen Reactions  . Sulfamethoxazole Swelling    Lips swell and feel numb  . Sulfonamide Derivatives     No family history on file.   Prior to Admission medications   Medication Sig Start Date End Date Taking? Authorizing Provider  amLODipine (NORVASC) 10 MG tablet Take 1 tablet (10 mg total) by mouth daily. 04/01/19 06/30/19  Arnoldo Lenis, MD  diclofenac (VOLTAREN) 75 MG EC tablet Take 75 mg by mouth 2 (two)  times daily. 08/20/18   [provider]  DULoxetine (CYMBALTA) 30 MG capsule Take 30 mg by mouth daily. 06/28/16   [provider]  hydrochlorothiazide (MICROZIDE) 12.5 MG capsule Take 1 capsule (12.5 mg total) by mouth daily. 04/13/19   Arnoldo Lenis, MD  levothyroxine (SYNTHROID, LEVOTHROID) 75 MCG tablet Take 75 mcg by mouth daily before breakfast. 06/01/16   [provider]  losartan (COZAAR) 50 MG tablet Take 50 mg by mouth at bedtime. 03/09/19   [provider]  nebivolol (BYSTOLIC) 10 MG tablet Take 10 mg by mouth at bedtime.     [provider]  omeprazole (PRILOSEC) 20 MG capsule Take 1 capsule (20 mg total) by mouth daily. While taking anti inflammatory medicine daily 08/21/16   Prudencio Burly III, PA-C  rosuvastatin (CRESTOR) 20 MG tablet Take 20 mg by mouth daily.  09/19/18   [provider]    Physical Exam: Vitals:   06/22/19 2032 06/22/19 2035 06/22/19 2045 06/22/19 2100  BP: (!) 149/79  (!) 146/99 (!) 179/99  Pulse: 77  91   Resp:   17 12  Temp: 98.3 F (36.8 C)     SpO2: 99%  99%   Weight:  89 kg    Height:  5' (1.524 m)      Constitutional: NAD, calm  Eyes: PERTLA, lids and conjunctivae normal ENMT: Mucous membranes are moist. Posterior pharynx clear of any exudate or lesions.   Neck: normal, supple, no masses, no thyromegaly Respiratory: clear to auscultation bilaterally, no wheezing, no crackles. No accessory muscle use.  Cardiovascular: S1 & S2 heard, regular rate and rhythm. No extremity edema.   Abdomen: No distension, no tenderness, soft. Bowel sounds active.  Musculoskeletal: no clubbing / cyanosis. No joint deformity upper and lower extremities.   Skin: no significant rashes, lesions, ulcers. Warm, dry, well-perfused. Neurologic: CN 2-12 grossly intact. Sensation intact, DTR normal. Strength 5/5 in all 4 limbs.  Psychiatric: Alert and oriented to person, place, situation, and time. Pleasant and  cooperative.    Labs and Imaging on Admission: I have personally reviewed following labs and imaging studies  CBC: Recent Labs  Lab 06/22/19 2033 06/22/19 2042  WBC 5.0  --   NEUTROABS 2.4  --   HGB 13.1 13.9  HCT 38.2 41.0  MCV 88.8  --   PLT 284  --    Basic Metabolic Panel: Recent Labs  Lab 06/22/19 2033 06/22/19 2042  NA 122* 124*  K 3.5 3.6  CL 87* 88*  CO2 25  --   GLUCOSE 106* 105*  BUN 14 13  CREATININE 0.88 1.00  CALCIUM 8.9  --    GFR: Estimated Creatinine Clearance: 51.2 mL/min (by C-G formula based on SCr of 1 mg/dL). Liver Function Tests: Recent Labs  Lab 06/22/19 2033  AST 22  ALT 16  ALKPHOS 96  BILITOT 0.6  PROT 6.8  ALBUMIN 4.1   No  results for input(s): LIPASE, AMYLASE in the last 168 hours. No results for input(s): AMMONIA in the last 168 hours. Coagulation Profile: Recent Labs  Lab 06/22/19 2033  INR 1.0   Cardiac Enzymes: No results for input(s): CKTOTAL, CKMB, CKMBINDEX, TROPONINI in the last 168 hours. BNP (last 3 results) No results for input(s): PROBNP in the last 8760 hours. HbA1C: No results for input(s): HGBA1C in the last 72 hours. CBG: No results for input(s): GLUCAP in the last 168 hours. Lipid Profile: No results for input(s): CHOL, HDL, LDLCALC, TRIG, CHOLHDL, LDLDIRECT in the last 72 hours. Thyroid Function Tests: No results for input(s): TSH, T4TOTAL, FREET4, T3FREE, THYROIDAB in the last 72 hours. Anemia Panel: No results for input(s): VITAMINB12, FOLATE, FERRITIN, TIBC, IRON, RETICCTPCT in the last 72 hours. Urine analysis:    Component Value Date/Time   COLORURINE YELLOW 03/05/2017 [place] 03/05/2017 1733   LABSPEC 1.012 03/05/2017 1733   PHURINE 6.0 03/05/2017 1733   GLUCOSEU NEGATIVE 03/05/2017 1733   HGBUR SMALL (A) 03/05/2017 1733   BILIRUBINUR NEGATIVE 03/05/2017 1733   KETONESUR 5 (A) 03/05/2017 1733   PROTEINUR NEGATIVE 03/05/2017 1733   NITRITE NEGATIVE 03/05/2017 1733    LEUKOCYTESUR MODERATE (A) 03/05/2017 1733   Sepsis Labs: @LABRCNTIP (procalcitonin:4,lacticidven:4) )No results found for this or any previous visit (from the past 240 hour(s)).   Radiological Exams on Admission: CT HEAD CODE STROKE WO CONTRAST  Result Date: 06/22/2019 CLINICAL DATA:  Code stroke. EXAM: CT HEAD WITHOUT CONTRAST TECHNIQUE: Contiguous axial images were obtained from the base of the skull through the vertex without intravenous contrast. COMPARISON:  03/05/2017 FINDINGS: Brain: There is no acute intracranial hemorrhage, mass effect, or edema. Gray-white differentiation is preserved. Patchy areas of hypoattenuation in the supratentorial white matter are nonspecific but may reflect mild to moderate chronic microvascular ischemic changes, which appear similar to the prior study. Ventricles and sulci are within normal limits in size and configuration. There is no extra-axial fluid collection. Vascular: There is no hyperdense vessel. Intracranial atherosclerotic calcification is present at the skull base. Skull: Calvarium is unremarkable. Sinuses/Orbits: No acute abnormality. Other: None. ASPECTS ([place]ert Stroke Program Early CT Score) - Ganglionic level infarction (caudate, lentiform nuclei, internal capsule, insula, M1-M3 cortex): 7 - Supraganglionic infarction (M4-M6 cortex): 3 Total score (0-10 with 10 being normal): 10 IMPRESSION: No acute intracranial hemorrhage or evidence of acute infarction. ASPECT score is 10. Chronic microvascular ischemic changes. These results were called by telephone at the time of interpretation on 06/22/2019 at 8:51 pm to provider [facility] , who verbally acknowledged these results. Electronically Signed   By: Macy Mis M.D.   On: 06/22/2019 20:54    EKG: Independently reviewed. Sinus rhythm, PVC's, LVH with IVCD.   Assessment/Plan   1. Transient aphasia  - Presents with acute-onset of difficulty speaking and difficulty understanding speech, almost  completely resolved in ED  - No acute findings on head CT  - Teleneurology recommending admission for further workup of suspected CVA/TIA  - Continue cardiac monitoring, neuro checks, keep NPO pending swallow eval, consult with PT/OT/SLP  - Check MRI brain, echocardiogram, fasting lipids, and A1c, start ASA, continue statin, follow-up pending CTA head and neck    2. Hyponatremia  - Serum sodium is 122 on admission  - Patient appears euvolemic; neurologic symptoms mild and nearly resolved, unlikely related to hyponatremia  - Hold HCTZ and Cymbalta, check urine sodium and urine osm, check TSH, follow sodium levels   3. Hypothyroidism  -  Check TSH in light of hyponatremia, Continue Synthroid    4. Hypertension  - Permit HTN for now    5. Depression  - Hold Cymbalta in light of hyponatremia   ADDENDUM: COVID pcr is positive in ED. Patient had exposure to COVID at Thanksgiving, family members tested positive, and she had URI sxs at that time consistent with viral illness but had negative COVID test. Her URI sxs have resolved aside from mild residual non-productive cough. She may have had COVID in November, and with improving sxs is not likely to be infectious anymore, but as she tested negative then, will plan to place her on precautions for now and check markers but hold any specific COVID treatments for now given minimal respiratory sxs.    DVT prophylaxis: Lovenox  Code Status: Full  Family Communication: Daughter updated at bedside   Consults called: None  Admission status: Observation     Vianne Bulls, MD [facility] Pager: See [email or web address]  If 7AM-7PM, please contact the daytime attending [email or web address]  06/22/2019, 9:43 PM

## 2019-06-22 NOTE — Consult Note (Signed)
[facility] Tele[facility]rology Consult Services   Date of Service:   06/22/2019 20:45:32  Impression:     .  G45.9 - Transient cerebral ischaemic attack, unspecified  Comments/Sign-Out: Patient symptoms have completely resolved. She is back to her baseline. At this point I would suggest admitting her to the hospital for TIA work-up. She will need MRI of the head, MRA of the head and neck, echocardiogram, telemetry monitoring, lipid panel and hemoglobin A1c testing and neurology follow-up. If there is any change in her neurological status, please feel free to reconsult [place]. At this point because of complete resolution of symptoms, she was not considered a candidate for alteplase. I do not see any clinical signs symptoms on exam, indicating an LVO.  Metrics: Last Known Well: 06/22/2019 19:50:00 [facility] Notification Time: 06/22/2019 20:45:32 Arrival Time: 06/22/2019 20:18:00 Stamp Time: 06/22/2019 20:45:32 Time First Login Attempt: 06/22/2019 20:50:28 Symptoms: Speech issues NIHSS Start Assessment Time: 06/22/2019 20:59:03 Patient is not a candidate for Alteplase/Activase. Patient was not deemed candidate for Alteplase/Activase thrombolytics because of Resolved symptoms (no residual disabling symptoms).  CT head showed no acute hemorrhage or acute core infarct.  Clinical Presentation is not Suggestive of Large Vessel Occlusive Disease  ED Physician notified of diagnostic impression and management plan on 06/22/2019 21:13:48  Our recommendations are outlined below.  Recommendations:     .  Activate Stroke Protocol Admission/Order Set     .  Stroke/Telemetry Floor     .  Neuro Checks     .  Bedside Swallow Eval     .  DVT Prophylaxis     .  IV Fluids, Normal Saline     .  Head of Bed 30 Degrees     .  Euglycemia and Avoid Hyperthermia (PRN Acetaminophen)     .  Initiate Aspirin 325 MG Daily  Routine Consultation with [facility] for Follow up  Care  Sign Out:     .  Discussed with Emergency Department Provider    ------------------------------------------------------------------------------  History of Present Illness: Patient is a 74 year old Female.  Patient was brought by private transportation with symptoms of Speech issues  Extremely pleasant 74 year old female with past medical history of hypertension, TIA came to the hospital with sudden onset speech difficulty. Patient was having some difficulty with naming, comprehension and expression. She was brought into the hospital by the family. Her symptoms have resolved at this point and according to the family, patient is almost back to her baseline. She denies any headaches. Denies any problems with her vision. Denies any focal body numbness or tingling. Denies any gait or balance issues.    Past Medical History:     . Hypertension     . Stroke     . There is NO history of Diabetes Mellitus     . There is NO history of Hyperlipidemia     . There is NO history of Atrial Fibrillation     . There is NO history of Coronary Artery Disease  Anticoagulant use:  No  Antiplatelet use: No     Examination: BP(146/99), Pulse(85), Blood Glucose(106) 1A: Level of Consciousness - Alert; keenly responsive + 0 1B: Ask Month and Age - Both Questions Right + 0 1C: Blink Eyes & Squeeze Hands - Performs Both Tasks + 0 2: Test Horizontal Extraocular Movements - Normal + 0 3: Test Visual Fields - No Visual Loss + 0 4: Test Facial Palsy (Use Grimace if Obtunded) - Normal symmetry + 0 5A:  Test Left Arm Motor Drift - No Drift for 10 Seconds + 0 5B: Test Right Arm Motor Drift - No Drift for 10 Seconds + 0 6A: Test Left Leg Motor Drift - No Drift for 5 Seconds + 0 6B: Test Right Leg Motor Drift - No Drift for 5 Seconds + 0 7: Test Limb Ataxia (FNF/Heel-Shin) - No Ataxia + 0 8: Test Sensation - Normal; No sensory loss + 0 9: Test Language/Aphasia - Normal; No aphasia + 0 10: Test  Dysarthria - Normal + 0 11: Test Extinction/Inattention - No abnormality + 0  NIHSS Score: 0  Pre-Morbid Modified Ranking Scale: 0 Points = No symptoms at all  Patient/Family was informed the Neurology Consult would happen via TeleHealth consult by way of interactive audio and video telecommunications and consented to receiving care in this manner.   Due to the immediate potential for life-threatening deterioration due to underlying acute neurologic illness, I spent 25 minutes providing critical care. This time includes time for face to face visit via telemedicine, review of medical records, imaging studies and discussion of findings with providers, the patient and/or family.   Dr Faustino Congress   [facility] [phone]  Case [id number]

## 2019-06-22 NOTE — ED Triage Notes (Addendum)
Per family pt started mixing up her words x 30 minutes ago. Pt has no extremity weakness at this time per family. Pt tries to follow commands

## 2019-06-22 NOTE — Progress Notes (Signed)
CODE STROKE DOCUMENTATION CALL TIME - NO CALL TO CT - ONLY CALLED RADIOLOGY BEEPTER TIME = 2027 EXAM STARTED 2032 EXAM FINISHED 2035 IMAGES SENT TO [place] = 2035 EXAM COMPLETED IN Epic = 2036 [place] CALLED = 2037

## 2019-06-22 NOTE — ED Notes (Signed)
Pt taken back to CT.

## 2019-06-23 ENCOUNTER — Observation Stay (HOSPITAL_BASED_OUTPATIENT_CLINIC_OR_DEPARTMENT_OTHER): Payer: PPO

## 2019-06-23 ENCOUNTER — Observation Stay (HOSPITAL_COMMUNITY): Payer: PPO

## 2019-06-23 DIAGNOSIS — I10 Essential (primary) hypertension: Secondary | ICD-10-CM | POA: Diagnosis not present

## 2019-06-23 DIAGNOSIS — E871 Hypo-osmolality and hyponatremia: Secondary | ICD-10-CM | POA: Diagnosis not present

## 2019-06-23 DIAGNOSIS — U071 COVID-19: Secondary | ICD-10-CM | POA: Diagnosis present

## 2019-06-23 DIAGNOSIS — R479 Unspecified speech disturbances: Secondary | ICD-10-CM | POA: Diagnosis not present

## 2019-06-23 DIAGNOSIS — G459 Transient cerebral ischemic attack, unspecified: Secondary | ICD-10-CM | POA: Diagnosis not present

## 2019-06-23 LAB — LIPID PANEL
Cholesterol: 262 mg/dL — ABNORMAL HIGH (ref 0–200)
HDL: 80 mg/dL (ref >40)
LDL Cholesterol: 174 mg/dL — ABNORMAL HIGH (ref 0–99)
Total CHOL/HDL Ratio: 3.3 RATIO
Triglycerides: 38 mg/dL (ref <150)
VLDL: 8 mg/dL (ref 0–40)

## 2019-06-23 LAB — HEMOGLOBIN A1C
Hgb A1c MFr Bld: 5.5 % (ref 4.8–5.6)
Mean Plasma Glucose: 111.15 mg/dL

## 2019-06-23 LAB — D-DIMER, QUANTITATIVE: D-Dimer, Quant: 1.02 ug/mL-FEU — ABNORMAL HIGH (ref 0.00–0.50)

## 2019-06-23 LAB — C-REACTIVE PROTEIN: CRP: 0.6 mg/dL (ref <1.0)

## 2019-06-23 LAB — FERRITIN: Ferritin: 24 ng/mL (ref 11–307)

## 2019-06-23 LAB — ECHOCARDIOGRAM LIMITED
Height: 60 in
Weight: 3137.6 oz

## 2019-06-23 LAB — MAGNESIUM: Magnesium: 2 mg/dL (ref 1.7–2.4)

## 2019-06-23 LAB — TSH: TSH: 1.626 u[IU]/mL (ref 0.350–4.500)

## 2019-06-23 MED ORDER — ASPIRIN 300 MG RE SUPP: 300.0000 mg | Freq: Every day | RECTAL | Status: DC

## 2019-06-23 MED ORDER — ASPIRIN 325 MG PO TABS: 325.0000 mg | ORAL_TABLET | Freq: Every day | ORAL | 3 refills | Status: DC

## 2019-06-23 MED ORDER — SODIUM CHLORIDE 0.9 % IV SOLN: INTRAVENOUS | Status: AC

## 2019-06-23 MED ORDER — LOSARTAN POTASSIUM 50 MG PO TABS: 50.0000 mg | ORAL_TABLET | Freq: Every day | ORAL | 3 refills | Status: DC

## 2019-06-23 MED ORDER — SENNOSIDES-DOCUSATE SODIUM 8.6-50 MG PO TABS: 2.0000 | ORAL_TABLET | Freq: Every evening | ORAL | 0 refills | Status: DC | PRN

## 2019-06-23 MED ORDER — STROKE: EARLY STAGES OF RECOVERY BOOK
Freq: Once | Status: DC
  Filled 2019-06-23: qty 1

## 2019-06-23 MED ORDER — ACETAMINOPHEN 325 MG PO TABS: 650.0000 mg | ORAL_TABLET | ORAL | Status: DC | PRN

## 2019-06-23 MED ORDER — ACETAMINOPHEN 650 MG RE SUPP: 650.0000 mg | RECTAL | Status: DC | PRN

## 2019-06-23 MED ORDER — ATORVASTATIN CALCIUM 80 MG PO TABS: 80.0000 mg | ORAL_TABLET | Freq: Every evening | ORAL | 11 refills | Status: DC

## 2019-06-23 MED ORDER — LEVOTHYROXINE SODIUM 50 MCG PO TABS
50.0000 ug | ORAL_TABLET | Freq: Every day | ORAL | Status: DC
  Administered 2019-06-23: 50 ug via ORAL
  Filled 2019-06-23: qty 1

## 2019-06-23 MED ORDER — ASPIRIN 325 MG PO TABS
325.0000 mg | ORAL_TABLET | Freq: Every day | ORAL | Status: DC
  Administered 2019-06-23: 325 mg via ORAL
  Filled 2019-06-23: qty 1

## 2019-06-23 MED ORDER — ENOXAPARIN SODIUM 40 MG/0.4ML ~~LOC~~ SOLN
40.0000 mg | Freq: Every day | SUBCUTANEOUS | Status: DC
  Administered 2019-06-23: 40 mg via SUBCUTANEOUS
  Filled 2019-06-23: qty 0.4

## 2019-06-23 MED ORDER — SENNOSIDES-DOCUSATE SODIUM 8.6-50 MG PO TABS
1.0000 | ORAL_TABLET | Freq: Every evening | ORAL | Status: DC | PRN
  Filled 2019-06-23: qty 1

## 2019-06-23 MED ORDER — NEBIVOLOL HCL 10 MG PO TABS: 10.0000 mg | ORAL_TABLET | Freq: Every day | ORAL | 3 refills | Status: AC

## 2019-06-23 MED ORDER — ACETAMINOPHEN 160 MG/5ML PO SOLN: 650.0000 mg | ORAL | Status: DC | PRN

## 2019-06-23 NOTE — Evaluation (Signed)
Physical Therapy Evaluation Patient Details Name: [name] MRN: [id number] DOB: [birth year] Today's Date: 06/23/2019   History of Present Illness  [name] is a 74 y.o. female with medical history significant for hypertension, hypothyroidism, and depression, now presenting to the emergency department with acute onset of speech difficulty.  Patient reports that she was in her usual state of health and having an uneventful day until shortly after eating dinner, shortly before 8 PM, she developed acute onset of speech difficulty, described as difficulty finding the right words to use, but she also reports some difficulty understanding her family when they were speaking to her.  There was no headache, focal numbness, or focal weakness associated with this but she did have transient vision disturbance in her right eye that resolved quickly.  Symptoms began to improve in the ED and have nearly resolved by time of admission.  She denies any recent fevers or chills.  She thought that she had COVID-19 in November due to cough, aches, malaise, and family members with confirmed COVID-19, but her test was negative at that time.  Those viral symptoms she was having in November have essentially resolved except for mild residual nonproductive cough.  She denies chest pain or palpitations.    Clinical Impression  Patient functioning at baseline for functional mobility and gait.  Plan:  Patient discharged from physical therapy to care of nursing for ambulation daily as tolerated for length of stay.     Follow Up Recommendations No PT follow up    Equipment Recommendations  None recommended by PT    Recommendations for Other Services       Precautions / Restrictions Precautions Precautions: None Restrictions Weight Bearing Restrictions: No      Mobility  Bed Mobility Overal bed mobility: Independent                Transfers Overall transfer level: Modified independent Equipment  used: None             General transfer comment: slightly increased time  Ambulation/Gait Ambulation/Gait assistance: Modified independent (Device/Increase time) Gait Distance (Feet): 75 Feet Assistive device: None Gait Pattern/deviations: WFL(Within Functional Limits) Gait velocity: slightly decreased   General Gait Details: ambulated in room without loss of balance with slightly wider base of support which is baseline per patient  Stairs            Wheelchair Mobility    Modified Rankin (Stroke Patients Only)       Balance Overall balance assessment: No apparent balance deficits (not formally assessed)                                           Pertinent Vitals/Pain Pain Assessment: No/denies pain    Home Living Family/patient expects to be discharged to:: Private residence Living Arrangements: Children Available Help at Discharge: Family;Available 24 hours/day Type of Home: House Home Access: Stairs to enter Entrance Stairs-Rails: None Entrance Stairs-Number of Steps: 2 Home Layout: One level Home Equipment: Walker - 2 wheels;Cane - single point;Bedside commode;Shower seat      Prior Function Level of Independence: Independent with assistive device(s)         Comments: household ambulator without AD, uses RW for community distances     Hand Dominance   Dominant Hand: Right    Extremity/Trunk Assessment   Upper Extremity Assessment Upper Extremity Assessment: Defer to OT  evaluation    Lower Extremity Assessment Lower Extremity Assessment: Overall WFL for tasks assessed    Cervical / Trunk Assessment Cervical / Trunk Assessment: Normal  Communication   Communication: No difficulties  Cognition Arousal/Alertness: Awake/alert Behavior During Therapy: WFL for tasks assessed/performed Overall Cognitive Status: Within Functional Limits for tasks assessed                                 General Comments: Has  no problem receiving communitcation if done slower than normal per patient      General Comments      Exercises     Assessment/Plan    PT Assessment Patent does not need any further PT services  PT Problem List         PT Treatment Interventions      PT Goals (Current goals can be found in the Care Plan section)  Acute Rehab PT Goals Patient Stated Goal: Return home with family to assist PT Goal Formulation: With patient/family Time For Goal Achievement: 06/23/19 Potential to Achieve Goals: Good    Frequency     Barriers to discharge        Co-evaluation               AM-PAC PT "6 Clicks" Mobility  Outcome Measure Help needed turning from your back to your side while in a flat bed without using bedrails?: None Help needed moving from lying on your back to sitting on the side of a flat bed without using bedrails?: None Help needed moving to and from a bed to a chair (including a wheelchair)?: None Help needed standing up from a chair using your arms (e.g., wheelchair or bedside chair)?: None Help needed to walk in hospital room?: None Help needed climbing 3-5 steps with a railing? : A Little 6 Click Score: 23    End of Session   Activity Tolerance: Patient tolerated treatment well Patient left: in bed;with call bell/phone within reach;with family/visitor present Nurse Communication: Mobility status PT Visit Diagnosis: Unsteadiness on feet (R26.81);Other abnormalities of gait and mobility (R26.89);Muscle weakness (generalized) (M62.81)    Time: [id number] PT Time Calculation (min) (ACUTE ONLY): 22 min   Charges:   PT Evaluation $PT Eval Moderate Complexity: 1 Mod PT Treatments $Therapeutic Activity: 8-22 mins        11:05 AM, 06/23/19 Lonell Grandchild, MPT Physical Therapist with [facility] 336 [phone] office [phone] mobile phone

## 2019-06-23 NOTE — Discharge Instructions (Signed)
1) take aspirin 325 mg daily with food for stroke prevention 2) take Lipitor/atorvastatin 80 mg every evening for stroke prevention--- your bad cholesterol is way too high 3) losartan and Bystolic as prescribed for blood pressure control--- improved blood pressure control will help to reduce your risk for stroke 4) follow-up with neurologist Dr. Phillips Odor as advised in about 6 to 8 weeks- Address: [address], [place], [place] [address] Phone: [phone] -Please call and make appointment with a Neurologist [facility] Kofi A MD)-to be seen in 6 to 8 weeks from now 5) please stop HCTZ/hydrochlorthiazide due to electrolyte concerns especially low sodium level   Transient Ischemic Attack  A transient ischemic attack (TIA) is a "warning stroke" that causes stroke-like symptoms that go away quickly. A TIA does not cause lasting damage to the brain. But having a TIA is a sign that you may be at risk for a stroke. Lifestyle changes and medical treatments can help prevent a stroke. It is important to know the symptoms of a TIA and what to do. Get help right away, even if your symptoms go away. The symptoms of a TIA are the same as those of a stroke. They can happen fast, and they usually go away within minutes or hours. They can include:  Weakness or loss of feeling in your face, arm, or leg. This often happens on one side of your body.  Trouble walking.  Trouble moving your arms or legs.  Trouble talking or understanding what people are saying.  Trouble seeing.  Seeing two of one object (double vision).  Feeling dizzy.  Feeling confused.  Loss of balance or coordination.  Feeling sick to your stomach (nauseous) and throwing up (vomiting).  A very bad headache for no reason. What increases the risk? Certain things may make you more likely to have a TIA. Some of these are things that you can change, such as:  Being very overweight (obese).  Using products that contain  nicotine or tobacco, such as cigarettes and e-cigarettes.  Taking birth control pills.  Not being active.  Drinking too much alcohol.  Using drugs. Other risk factors include:  Having an irregular heartbeat (atrial fibrillation).  Being African American or Hispanic.  Having had blood clots, stroke, TIA, or heart attack in the past.  Being a woman with a history of high blood pressure in pregnancy (preeclampsia).  Being over the age of 69.  Being female.  Having family history of stroke.  Having the following diseases or conditions: ? High blood pressure. ? High cholesterol. ? Diabetes. ? Heart disease. ? Sickle cell disease. ? Sleep apnea. ? Migraine headache. ? Long-term (chronic) diseases that cause soreness and swelling (inflammation). ? Disorders that affect how your blood clots. Follow these instructions at home: Medicines   Take over-the-counter and prescription medicines only as told by your doctor.  If you were told to take aspirin or another medicine to thin your blood, take it exactly as told by your doctor. ? Taking too much of the medicine can cause bleeding. ? Taking too little of the medicine may not work to treat the problem. Eating and drinking   Eat 5 or more servings of fruits and vegetables each day.  Follow instructions from your doctor about your diet. You may need to follow a certain diet to help lower your risk of having a stroke. You may need to: ? Eat a diet that is low in fat and salt. ? Eat foods that  contain a lot of fiber. ? Limit the amount of carbohydrates and sugar in your diet.  Limit alcohol intake to 1 drink a day for nonpregnant women and 2 drinks a day for men. One drink equals 12 oz of beer, 5 oz of wine, or 1 oz of hard liquor. General instructions  Keep a healthy weight.  Stay active. Try to get at least 30 minutes of activity on all or most days.  Find out if you have a condition called sleep apnea. Get treatment if  needed.  Do not use any products that contain nicotine or tobacco, such as cigarettes and e-cigarettes. If you need help quitting, ask your doctor.  Do not abuse drugs.  Keep all follow-up visits as told by your doctor. This is important. Get help right away if:  You have any signs of stroke. "BE FAST" is an easy way to remember the main warning signs: ? B - Balance. Signs are dizziness, sudden trouble walking, or loss of balance. ? E - Eyes. Signs are trouble seeing or a sudden change in how you see. ? F - Face. Signs are sudden weakness or loss of feeling of the face, or the face or eyelid drooping on one side. ? A - Arms. Signs are weakness or loss of feeling in an arm. This happens suddenly and usually on one side of the body. ? S - Speech. Signs are sudden trouble speaking, slurred speech, or trouble understanding what people say. ? T - Time. Time to call emergency services. Write down what time symptoms started.  You have other signs of stroke, such as: ? A sudden, very bad headache with no known cause. ? Feeling sick to your stomach (nausea). ? Throwing up (vomiting). ? Jerky movements that you cannot control (seizure). These symptoms may be an emergency. Do not wait to see if the symptoms will go away. Get medical help right away. Call your local emergency services (911 in the [place] Do not drive yourself to the hospital. Summary  A transient ischemic attack (TIA) is a "warning stroke" that causes stroke-like symptoms that go away quickly.  A TIA is a medical emergency. Get help right away, even if your symptoms go away.  A TIA does not cause lasting damage to the brain.  Having a TIA is a sign that you may be at risk for a stroke. Lifestyle changes and medical treatments can help prevent a stroke. This information is not intended to replace advice given to you by your health care provider. Make sure you discuss any questions you have with your health care provider.  1) take  aspirin 325 mg daily with food for stroke prevention 2) take Lipitor/atorvastatin 80 mg every evening for stroke prevention--- your bad cholesterol is way too high 3) losartan and Bystolic as prescribed for blood pressure control--- improved blood pressure control will help to reduce your risk for stroke 4) follow-up with neurologist Dr. Phillips Odor as advised in about 6 to 8 weeks- Address: [address], [place], [place] [address] Phone: [phone] -Please call and make appointment with a Neurologist [facility] Kofi A MD)-to be seen in 6 to 8 weeks from now 5) please stop HCTZ/hydrochlorthiazide due to electrolyte concerns especially low sodium level

## 2019-06-23 NOTE — Discharge Summary (Signed)
[name], is a 74 y.o. female  DOB Jun 27, 1945  MRN [id number].  Admission date:  06/22/2019  Admitting Physician  Vianne Bulls, MD  Discharge Date:  06/23/2019   Primary MD  [name], MD  Recommendations for primary care physician for things to follow:    1) take aspirin 325 mg daily with food for stroke prevention 2) take Lipitor/atorvastatin 80 mg every evening for stroke prevention--- your bad cholesterol is way too high 3) losartan and Bystolic as prescribed for blood pressure control--- improved blood pressure control will help to reduce your risk for stroke 4) follow-up with neurologist Dr. Phillips Odor as advised in about 6 to 8 weeks- Address: [address], [place], [place] [address] Phone: [phone] -Please call and make appointment with a Neurologist [facility] Kofi A MD)-to be seen in 6 to 8 weeks from now 5) please stop HCTZ/hydrochlorthiazide due to electrolyte concerns especially low sodium level  Admission Diagnosis  Transient speech disturbance [R47.9]   Discharge Diagnosis  Transient speech disturbance [R47.9]    Principal Problem:   Transient speech disturbance Active Problems:   COVID-19 virus infection   Essential hypertension   Hyponatremia   Depression   Anxiety   Hypothyroidism      Past Medical History:  Diagnosis Date  . Anemia   . Anxiety   . Arthritis   . Complication of anesthesia    "pt. holds her breath when waking up"  . Depression   . GERD (gastroesophageal reflux disease)   . Hypertension   . Hypothyroidism   . PONV (postoperative nausea and vomiting)   . Staph infection    abdomen and returned 5 yrs. later    Past Surgical History:  Procedure Laterality Date  . ABDOMINAL HYSTERECTOMY    . CATARACT EXTRACTION W/PHACO Right 01/22/2019   Procedure: CATARACT EXTRACTION PHACO AND INTRAOCULAR LENS PLACEMENT (IOC);  Surgeon:  [name], MD;  Location: [facility];  Service: Ophthalmology;  Laterality: Right;  right, CDE: 19.63  . CATARACT EXTRACTION W/PHACO Left 02/16/2019   Procedure: CATARACT EXTRACTION PHACO AND INTRAOCULAR LENS PLACEMENT LEFT EYE (CDE: 14.36);  Surgeon: [name], MD;  Location: [facility];  Service: Ophthalmology;  Laterality: Left;  . CERVICAL FUSION  1990's  . COLON SURGERY    . FOOT SURGERY Left    reshaped  . HERNIA REPAIR    . HIP ARTHROPLASTY Left   . JOINT REPLACEMENT Right    hip  . LUMBAR SPINE SURGERY    . REPLACEMENT TOTAL KNEE Right   . THYROID SURGERY    . TOTAL KNEE ARTHROPLASTY Left 08/21/2016   Procedure: LEFT TOTAL KNEE ARTHROPLASTY;  Surgeon: Renette Butters, MD;  Location: [facility];  Service: Orthopedics;  Laterality: Left;       HPI  from the history and physical done on the day of admission:    Chief Complaint: Speech difficulty   HPI: [name] is a 74 y.o. female with medical history significant for hypertension, hypothyroidism,  and depression, now presenting to the emergency department with acute onset of speech difficulty.  Patient reports that she was in her usual state of health and having an uneventful day until shortly after eating dinner, shortly before 8 PM, she developed acute onset of speech difficulty, described as difficulty finding the right words to use, but she also reports some difficulty understanding her family when they were speaking to her.  There was no headache, focal numbness, or focal weakness associated with this but she did have transient vision disturbance in her right eye that resolved quickly.  Symptoms began to improve in the ED and have nearly resolved by time of admission.  She denies any recent fevers or chills.  She thought that she had COVID-19 in November due to cough, aches, malaise, and family members with confirmed COVID-19, but her test was negative at that time.  Those viral symptoms she was having in November have  essentially resolved except for mild residual nonproductive cough.  She denies chest pain or palpitations.  ED Course: Upon arrival to the ED, patient is found to be afebrile, saturating well on room air, and hypertensive to 180/100.  EKG features sinus rhythm with PVCs and LVH with IVCD.  Noncontrast head CT is negative for acute findings.  Chemistry panel notable for sodium of 122 and CBC unremarkable.  Ethanol level undetectable.  Covid PCR screening test is ordered but not yet resulted.  Teleneurology evaluated the patient in the ED and recommends admission for additional work-up and hospitalists are consulted for admission.    Hospital Course:     1)TIA--- no clinical or imaging evidence of acute stroke, transient aphasia and neuro deficits have resolved completely, daughter at bedside states patient is back to baseline. -MRI brain without acute findings CTA head and neck without LVO or other acute findings -CT cerebral perfusion without acute findings  Echo with preserved EF at 60 to 65%.  With grade 1 diastolic dysfunction and no regional wall motion abnormalities. -A1c 5.5, TSH 1.6, LDL 174 with a total cholesterol of 262 and HDL of 80 -Aspirin, Lipitor as prescribed -Outpatient follow-up with neurologist Dr. Phillips Odor as advised in 6 weeks -Telemetry neurology input appreciated  2) COVID-19 respiratory infection--- currently largely asymptomatic, multiple family members have had COVID-19 infection since Thanksgiving- Return or call if more symptomatic, continue to quarantine and isolate, see discharge instructions  3) hyponatremia--- stop HCTZ, may have to consider changing Cymbalta if hyponatremia persists despite stopping HCTZ  Discharge Condition: stable  Follow UP--- neurology follow-up in 6 weeks   Consults obtained -telemetry neurology  Diet and Activity recommendation:  As advised  Discharge Instructions    Discharge Instructions    Call MD for:  persistant  dizziness or light-headedness   Complete by: As directed    Call MD for:  persistant nausea and vomiting   Complete by: As directed    Call MD for:  severe uncontrolled pain   Complete by: As directed    Call MD for:  temperature >100.4   Complete by: As directed    Diet - low sodium heart healthy   Complete by: As directed    Discharge instructions   Complete by: As directed    1) take aspirin 325 mg daily with food for stroke prevention 2) take Lipitor/atorvastatin 80 mg every evening for stroke prevention--- your bad cholesterol is way too high 3) losartan and Bystolic as prescribed for blood pressure control--- improved blood pressure control will help to  reduce your risk for stroke 4) follow-up with neurologist Dr. Phillips Odor as advised in about 6 to 8 weeks- Address: [address], [place], [place] [address] Phone: [phone] -Please call and make appointment with a Neurologist [name] Kofi A MD)-to be seen in 6 to 8 weeks from now 5) please stop HCTZ/hydrochlorthiazide due to electrolyte concerns especially low sodium level   Increase activity slowly   Complete by: As directed         Discharge Medications     Allergies as of 06/23/2019      Reactions   Sulfamethoxazole Swelling   Lips swell and feel numb      Medication List    STOP taking these medications   amLODipine 10 MG tablet Commonly known as: NORVASC   diclofenac 75 MG EC tablet Commonly known as: VOLTAREN   hydrochlorothiazide 12.5 MG capsule Commonly known as: MICROZIDE     TAKE these medications   aspirin 325 MG tablet Take 1 tablet (325 mg total) by mouth daily with breakfast.   atorvastatin 80 MG tablet Commonly known as: Lipitor Take 1 tablet (80 mg total) by mouth every evening.   DULoxetine 60 MG capsule Commonly known as: CYMBALTA Take 60 mg by mouth every morning.   levothyroxine 50 MCG tablet Commonly known as: SYNTHROID Take 50 mcg by mouth daily before  breakfast.   losartan 50 MG tablet Commonly known as: Cozaar Take 1 tablet (50 mg total) by mouth daily.   MAGNESIUM PO Take 1 tablet by mouth every evening.   nebivolol 10 MG tablet Commonly known as: BYSTOLIC Take 1 tablet (10 mg total) by mouth at bedtime.   omeprazole 20 MG capsule Commonly known as: PriLOSEC Take 1 capsule (20 mg total) by mouth daily. While taking anti inflammatory medicine daily   PROBIOTIC FORMULA PO Take 1 capsule by mouth every evening.   senna-docusate 8.6-50 MG tablet Commonly known as: Senokot-S Take 2 tablets by mouth at bedtime as needed for mild constipation.      Major procedures and Radiology Reports - PLEASE review detailed and final reports for all details, in brief -   CT Angio Head W or Wo Contrast  Result Date: 06/22/2019 CLINICAL DATA:  Speech difficulty EXAM: CT ANGIOGRAPHY HEAD AND NECK CT PERFUSION BRAIN TECHNIQUE: Multidetector CT imaging of the head and neck was performed using the standard protocol during bolus administration of intravenous contrast. Multiplanar CT image reconstructions and MIPs were obtained to evaluate the vascular anatomy. Carotid stenosis measurements (when applicable) are obtained utilizing NASCET criteria, using the distal internal carotid diameter as the denominator. Multiphase CT imaging of the brain was performed following IV bolus contrast injection. Subsequent parametric perfusion maps were calculated using RAPID software. CONTRAST:  154mL OMNIPAQUE IOHEXOL 350 MG/ML SOLN COMPARISON:  2011 FINDINGS: CTA NECK FINDINGS Aortic arch: Great vessel origins are patent. Right carotid system: Patent. Mild calcified plaque at the bifurcation and ICA origin without measurable stenosis. Left carotid system: Patent. Mild calcified plaque at the bifurcation and ICA origin without measurable stenosis. Vertebral arteries: Patent. Skeleton: Advanced multilevel degenerative changes of the cervical spine. There is fusion across the  C5-C6 and C6-C7 disc spaces. 5 mm anterolisthesis at C7-T1 with fusion across the disc space. Other neck: Enlarged left thyroid lobe with multiple nodules measuring up to 1.5 cm. No neck mass or adenopathy Upper chest: No apical lung mass. Review of the MIP images confirms the above findings CTA HEAD FINDINGS Anterior circulation: Intracranial internal carotid  arteries are patent with mild calcified plaque. Anterior and middle cerebral arteries are patent. Posterior circulation: Intracranial vertebral arteries, basilar artery, and posterior cerebral arteries are patent. There is aspect irregularity with moderate stenosis of the proximal right P2 PCA. A posterior communicating artery is identified on the left. Venous sinuses: As permitted by contrast timing, patent. Review of the MIP images confirms the above findings CT Brain Perfusion Findings: CBF (<30%) Volume: 94mL Perfusion (Tmax>6.0s) volume: 75mL in the inferior left parietal lobe Mismatch Volume: 56mL Infarction Location:None IMPRESSION: No large vessel occlusion or hemodynamically significant stenosis. There is no core infarct by perfusion imaging. A small area of territory at risk is identified by the software in the inferior left parietal lobe, which may be artifactual. Focal moderate stenosis right P2 PCA. Enlarged left thyroid lobe with multiple nodules measuring up to 1.5 cm. Ultrasound recommended if not previously performed. Electronically Signed   By: Macy Mis M.D.   On: 06/22/2019 21:57   CT Angio Neck W and/or Wo Contrast  Result Date: 06/22/2019 CLINICAL DATA:  Speech difficulty EXAM: CT ANGIOGRAPHY HEAD AND NECK CT PERFUSION BRAIN TECHNIQUE: Multidetector CT imaging of the head and neck was performed using the standard protocol during bolus administration of intravenous contrast. Multiplanar CT image reconstructions and MIPs were obtained to evaluate the vascular anatomy. Carotid stenosis measurements (when applicable) are obtained  utilizing NASCET criteria, using the distal internal carotid diameter as the denominator. Multiphase CT imaging of the brain was performed following IV bolus contrast injection. Subsequent parametric perfusion maps were calculated using RAPID software. CONTRAST:  122mL OMNIPAQUE IOHEXOL 350 MG/ML SOLN COMPARISON:  2011 FINDINGS: CTA NECK FINDINGS Aortic arch: Great vessel origins are patent. Right carotid system: Patent. Mild calcified plaque at the bifurcation and ICA origin without measurable stenosis. Left carotid system: Patent. Mild calcified plaque at the bifurcation and ICA origin without measurable stenosis. Vertebral arteries: Patent. Skeleton: Advanced multilevel degenerative changes of the cervical spine. There is fusion across the C5-C6 and C6-C7 disc spaces. 5 mm anterolisthesis at C7-T1 with fusion across the disc space. Other neck: Enlarged left thyroid lobe with multiple nodules measuring up to 1.5 cm. No neck mass or adenopathy Upper chest: No apical lung mass. Review of the MIP images confirms the above findings CTA HEAD FINDINGS Anterior circulation: Intracranial internal carotid arteries are patent with mild calcified plaque. Anterior and middle cerebral arteries are patent. Posterior circulation: Intracranial vertebral arteries, basilar artery, and posterior cerebral arteries are patent. There is aspect irregularity with moderate stenosis of the proximal right P2 PCA. A posterior communicating artery is identified on the left. Venous sinuses: As permitted by contrast timing, patent. Review of the MIP images confirms the above findings CT Brain Perfusion Findings: CBF (<30%) Volume: 66mL Perfusion (Tmax>6.0s) volume: 98mL in the inferior left parietal lobe Mismatch Volume: 58mL Infarction Location:None IMPRESSION: No large vessel occlusion or hemodynamically significant stenosis. There is no core infarct by perfusion imaging. A small area of territory at risk is identified by the software in the  inferior left parietal lobe, which may be artifactual. Focal moderate stenosis right P2 PCA. Enlarged left thyroid lobe with multiple nodules measuring up to 1.5 cm. Ultrasound recommended if not previously performed. Electronically Signed   By: Macy Mis M.D.   On: 06/22/2019 21:57   MR BRAIN WO CONTRAST  Result Date: 06/23/2019 CLINICAL DATA:  TIA EXAM: MRI HEAD WITHOUT CONTRAST TECHNIQUE: Multiplanar, multiecho pulse sequences of the brain and surrounding structures were obtained without intravenous contrast.  COMPARISON:  Correlation made with recent CT imaging FINDINGS: Brain: There is no acute infarction or intracranial hemorrhage. There is no intracranial mass, mass effect, or edema. There is no hydrocephalus or extra-axial fluid collection. Patchy T2 hyperintensity in the supratentorial white matter is nonspecific but probably reflects mild to moderate chronic microvascular ischemic changes. Ventricles and sulci are within normal limits in size and configuration. Vascular: Major vessel flow voids at the skull base are preserved. Skull and upper cervical spine: Normal marrow signal is preserved. Sinuses/Orbits: Paranasal sinuses are aerated. Orbits are unremarkable. Other: Sella is unremarkable.  Mastoid air cells are clear. IMPRESSION: No acute infarction, hemorrhage, or mass. Chronic microvascular ischemic changes. Electronically Signed   By: Macy Mis M.D.   On: 06/23/2019 08:12   CT CEREBRAL PERFUSION W CONTRAST  Result Date: 06/22/2019 CLINICAL DATA:  Speech difficulty EXAM: CT ANGIOGRAPHY HEAD AND NECK CT PERFUSION BRAIN TECHNIQUE: Multidetector CT imaging of the head and neck was performed using the standard protocol during bolus administration of intravenous contrast. Multiplanar CT image reconstructions and MIPs were obtained to evaluate the vascular anatomy. Carotid stenosis measurements (when applicable) are obtained utilizing NASCET criteria, using the distal internal carotid  diameter as the denominator. Multiphase CT imaging of the brain was performed following IV bolus contrast injection. Subsequent parametric perfusion maps were calculated using RAPID software. CONTRAST:  157mL OMNIPAQUE IOHEXOL 350 MG/ML SOLN COMPARISON:  2011 FINDINGS: CTA NECK FINDINGS Aortic arch: Great vessel origins are patent. Right carotid system: Patent. Mild calcified plaque at the bifurcation and ICA origin without measurable stenosis. Left carotid system: Patent. Mild calcified plaque at the bifurcation and ICA origin without measurable stenosis. Vertebral arteries: Patent. Skeleton: Advanced multilevel degenerative changes of the cervical spine. There is fusion across the C5-C6 and C6-C7 disc spaces. 5 mm anterolisthesis at C7-T1 with fusion across the disc space. Other neck: Enlarged left thyroid lobe with multiple nodules measuring up to 1.5 cm. No neck mass or adenopathy Upper chest: No apical lung mass. Review of the MIP images confirms the above findings CTA HEAD FINDINGS Anterior circulation: Intracranial internal carotid arteries are patent with mild calcified plaque. Anterior and middle cerebral arteries are patent. Posterior circulation: Intracranial vertebral arteries, basilar artery, and posterior cerebral arteries are patent. There is aspect irregularity with moderate stenosis of the proximal right P2 PCA. A posterior communicating artery is identified on the left. Venous sinuses: As permitted by contrast timing, patent. Review of the MIP images confirms the above findings CT Brain Perfusion Findings: CBF (<30%) Volume: 76mL Perfusion (Tmax>6.0s) volume: 101mL in the inferior left parietal lobe Mismatch Volume: 78mL Infarction Location:None IMPRESSION: No large vessel occlusion or hemodynamically significant stenosis. There is no core infarct by perfusion imaging. A small area of territory at risk is identified by the software in the inferior left parietal lobe, which may be artifactual. Focal  moderate stenosis right P2 PCA. Enlarged left thyroid lobe with multiple nodules measuring up to 1.5 cm. Ultrasound recommended if not previously performed. Electronically Signed   By: Macy Mis M.D.   On: 06/22/2019 21:57   CT HEAD CODE STROKE WO CONTRAST  Result Date: 06/22/2019 CLINICAL DATA:  Code stroke. EXAM: CT HEAD WITHOUT CONTRAST TECHNIQUE: Contiguous axial images were obtained from the base of the skull through the vertex without intravenous contrast. COMPARISON:  03/05/2017 FINDINGS: Brain: There is no acute intracranial hemorrhage, mass effect, or edema. Gray-white differentiation is preserved. Patchy areas of hypoattenuation in the supratentorial white matter are nonspecific but may reflect  mild to moderate chronic microvascular ischemic changes, which appear similar to the prior study. Ventricles and sulci are within normal limits in size and configuration. There is no extra-axial fluid collection. Vascular: There is no hyperdense vessel. Intracranial atherosclerotic calcification is present at the skull base. Skull: Calvarium is unremarkable. Sinuses/Orbits: No acute abnormality. Other: None. ASPECTS ([place] Stroke Program Early CT Score) - Ganglionic level infarction (caudate, lentiform nuclei, internal capsule, insula, M1-M3 cortex): 7 - Supraganglionic infarction (M4-M6 cortex): 3 Total score (0-10 with 10 being normal): 10 IMPRESSION: No acute intracranial hemorrhage or evidence of acute infarction. ASPECT score is 10. Chronic microvascular ischemic changes. These results were called by telephone at the time of interpretation on 06/22/2019 at 8:51 pm to provider [facility] , who verbally acknowledged these results. Electronically Signed   By: Macy Mis M.D.   On: 06/22/2019 20:54   ECHOCARDIOGRAM LIMITED  Result Date: 06/23/2019   ECHOCARDIOGRAM LIMITED REPORT   Patient Name:   [name] Date of Exam: 06/23/2019 Medical Rec #:  [id number]        Height:       60.0 in  Accession #:    [id number]       Weight:       196.1 lb Date of Birth:  11-Sep-1945        BSA:          1.85 m Patient Age:    26 years         BP:           118/65 mmHg Patient Gender: F                HR:           69 bpm. Exam Location:  [facility]  Procedure: 2D Echo, Cardiac Doppler and Color Doppler Indications:    TIA 435.9 / G45.9  History:        Patient has prior history of Echocardiogram examinations, most                 recent 03/06/2017. COVID-19 virus infection,COVID-19 virus                 infection.  Sonographer:    Alvino Chapel RCS Referring Phys: [id number] TIMOTHY S OPYD  Sonographer Comments: Can Only see subcostal windows with deep inspiration, so not able to sniff also. IMPRESSIONS  1. Left ventricular ejection fraction, by visual estimation, is 60 to 65%. The left ventricle has normal function. There is mildly increased left ventricular wall thickness.  2. Left ventricular diastolic parameters are consistent with Grade I diastolic dysfunction (impaired relaxation).  3. The left ventricle has no regional wall motion abnormalities.  4. Global right ventricle has normal systolc function.The right ventricular size is normal. no increase in right ventricular wall thickness.  5. Left atrial size was moderately dilated.  6. Right atrial size was mildly dilated.  7. The mitral valve is grossly normal. Mild mitral valve regurgitation.  8. The tricuspid valve was grossly normal. Tricuspid valve regurgitation is mild.  9. Tricuspid valve regurgitation is mild. 10. No evidence of aortic valve sclerosis or stenosis. 11. Moderately elevated pulmonary artery systolic pressure. 12. The interatrial septum was not well visualized. FINDINGS  Left Ventricle: Left ventricular ejection fraction, by visual estimation, is 60 to 65%. The left ventricle has normal function. The left ventricle has no regional wall motion abnormalities. The left ventricular internal cavity size was the left ventricle is normal in size.  There is mildly increased left ventricular wall thickness. Concentric left ventricular hypertrophy. Left ventricular diastolic parameters are consistent with Grade I diastolic dysfunction (impaired relaxation). Right Ventricle: The right ventricular size is normal. No increase in right ventricular wall thickness. Global RV systolic function is has normal systolic function. The tricuspid regurgitant velocity is 2.74 m/s, and with an assumed right atrial pressure  of 10 mmHg, the estimated right ventricular systolic pressure is moderately elevated at 40.0 mmHg. Left Atrium: Left atrial size was moderately dilated. Right Atrium: Right atrial size was mildly dilated. Right atrial pressure is estimated at 10 mmHg. Pericardium: There is no evidence of pericardial effusion is seen. There is no evidence of pericardial effusion. Mitral Valve: The mitral valve is grossly normal. MV Area by PHT, 4.21 cm. MV PHT, 52.20 msec. Mild mitral valve regurgitation. Tricuspid Valve: The tricuspid valve is grossly normal. Tricuspid valve regurgitation is mild. Aortic Valve: The aortic valve is tricuspid. Aortic valve regurgitation is not visualized. The aortic valve is structurally normal, with no evidence of sclerosis or stenosis. Pulmonic Valve: The pulmonic valve was grossly normal. Pulmonic valve regurgitation is not visualized by color flow Doppler. Pulmonic regurgitation is not visualized by color flow Doppler. Aorta: The aortic root is normal in size and structure. Venous: The inferior vena cava was not well visualized. Shunts: The interatrial septum was not well visualized.  LEFT VENTRICLE          Normals PLAX 2D LVIDd:         4.24 cm  3.6 cm   Diastology                 Normals LVIDs:         2.96 cm  1.7 cm   LV e' lateral:   4.46 cm/s 6.42 cm/s LV PW:         1.19 cm  1.4 cm   LV E/e' lateral: 14.8      15.4 LV IVS:        1.23 cm  1.3 cm   LV e' medial:    4.90 cm/s 6.96 cm/s LVOT diam:     1.90 cm  2.0 cm   LV E/e'  medial:  13.4      6.96 LV SV:         46 ml    79 ml LV SV Index:   23.38    45 ml/m2 LVOT Area:     2.84 cm 3.14 cm2  RIGHT VENTRICLE RV S prime:     16.90 cm/s TAPSE (M-mode): 1.9 cm LEFT ATRIUM           Index LA diam:      4.60 cm 2.49 cm/m LA Vol (A4C): 66.6 ml 35.98 ml/m   AORTA                 Normals Ao Root diam: 3.30 cm 31 mm MITRAL VALVE              Normals   TRICUSPID VALVE             Normals MV Area (PHT): 4.21 cm             TR Peak grad:   30.0 mmHg MV PHT:        52.20 msec 55 ms     TR Vmax:        274.00 cm/s 288 cm/s MV Decel Time: 180 msec   187 ms MV E velocity: 65.80 cm/s  103 cm/s  SHUNTS MV A velocity: 67.20 cm/s 70.3 cm/s Systemic Diam: 1.90 cm MV E/A ratio:  0.98       1.5  Kate Sable MD Electronically signed by Kate Sable MD Signature Date/Time: 06/23/2019/11:10:33 AMThe mitral valve is grossly normal.    Final    Micro Results   Recent Results (from the past 240 hour(s))  Respiratory Panel by RT PCR (Flu A&B, Covid) - Nasopharyngeal Swab     Status: Abnormal   Collection Time: 06/22/19  9:38 PM   Specimen: Nasopharyngeal Swab  Result Value Ref Range Status   SARS Coronavirus 2 by RT PCR POSITIVE (A) NEGATIVE Final    Comment: RESULT CALLED TO, READ BACK BY AND VERIFIED WITH: WALKER,T. AT 2243 ON 06/22/2019 BY EVA (NOTE) SARS-CoV-2 target nucleic acids are DETECTED. SARS-CoV-2 RNA is generally detectable in upper respiratory specimens  during the acute phase of infection. Positive results are indicative of the presence of the identified virus, but do not rule out bacterial infection or co-infection with other pathogens not detected by the test. Clinical correlation with patient history and other diagnostic information is necessary to determine patient infection status. The expected result is Negative. Fact Sheet for Patients:  PinkCheek.be Fact Sheet for Healthcare  Providers: GravelBags.it This test is not yet approved or cleared by the [place] FDA and  has been authorized for detection and/or diagnosis of SARS-CoV-2 by FDA under an Emergency Use Authorization (EUA).  This EUA will remain in effect (meaning this test can be used ) for the duration of  the COVID-19 declaration under Section 564(b)(1) of the Act, 21 U.S.C. section 360bbb-3(b)(1), unless the authorization is terminated or revoked sooner.    Influenza A by PCR NEGATIVE NEGATIVE Final   Influenza B by PCR NEGATIVE NEGATIVE Final    Comment: (NOTE) The Xpert Xpress SARS-CoV-2/FLU/RSV assay is intended as an aid in  the diagnosis of influenza from Nasopharyngeal swab specimens and  should not be used as a sole basis for treatment. Nasal washings and  aspirates are unacceptable for Xpert Xpress SARS-CoV-2/FLU/RSV  testing. Fact Sheet for Patients: PinkCheek.be Fact Sheet for Healthcare Providers: GravelBags.it This test is not yet approved or cleared by the [place] FDA and  has been authorized for detection and/or diagnosis of SARS-CoV-2 by  FDA under an Emergency Use Authorization (EUA). This EUA will remain  in effect (meaning this test can be used) for the duration of the  Covid-19 declaration under Section 564(b)(1) of the Act, 21  U.S.C. section 360bbb-3(b)(1), unless the authorization is  terminated or revoked. Performed at [facility], [address] [place], [place] [address]        Today   Subjective    [name] today has no new complaints -No further speech disturbance -Ambulating around in the room -According to patient's daughter who is at bedside patient speech and gait is no different from her baseline          Patient has been seen and examined prior to discharge   Objective   Blood pressure 118/65, pulse 69, temperature 98.3 F (36.8 C), resp. rate  15, height 5' (1.524 m), weight 89 kg, SpO2 94 %.   Intake/Output Summary (Last 24 hours) at 06/23/2019 1200 Last data filed at 06/23/2019 0730 Gross per 24 hour  Intake 458.75 ml  Output --  Net 458.75 ml    Exam Gen:- Awake Alert, no acute distress  HEENT:- [place].AT, No sclera icterus Neck-Supple Neck,No JVD,.  Lungs-  CTAB , good air movement bilaterally  CV- S1, S2 normal, regular Abd-  +ve B.Sounds, Abd Soft, No tenderness,    Extremity/Skin:- No  edema,   good pulses Psych-affect is appropriate, oriented x3 Neuro-no new focal deficits, no tremors    Data Review   CBC w Diff:  Lab Results  Component Value Date   WBC 5.0 06/22/2019   HGB 13.9 06/22/2019   HCT 41.0 06/22/2019   PLT 284 06/22/2019   LYMPHOPCT 37 06/22/2019   MONOPCT 11 06/22/2019   EOSPCT 4 06/22/2019   BASOPCT 1 06/22/2019    CMP:  Lab Results  Component Value Date   NA 124 (L) 06/22/2019   K 3.6 06/22/2019   CL 88 (L) 06/22/2019   CO2 25 06/22/2019   BUN 13 06/22/2019   CREATININE 1.00 06/22/2019   PROT 6.8 06/22/2019   ALBUMIN 4.1 06/22/2019   BILITOT 0.6 06/22/2019   ALKPHOS 96 06/22/2019   AST 22 06/22/2019   ALT 16 06/22/2019  .   Total Discharge time is about 33 minutes  Roxan Hockey M.D on 06/23/2019 at 12:00 PM  Go to [email or web address] -  for contact info  [facility] - Office  [phone]

## 2019-06-23 NOTE — Progress Notes (Signed)
*  PRELIMINARY RESULTS* Echocardiogram Limited 2-D Echocardiogram has been performed.  Samuel Germany 06/23/2019, 10:40 AM

## 2019-06-23 NOTE — Progress Notes (Signed)
OT Cancellation Note  Patient Details Name: [name] MRN: [id number] DOB: 03/16/1946   Cancelled Treatment:    Reason Eval/Treat Not Completed: OT screened, no needs identified, will sign off. Per chart review pt symptoms have resolved. Pt is at baseline with mobility and ADL completion, no further OT services required at this time.     Guadelupe Sabin, OTR/L  [phone] 06/23/2019, 12:04 PM

## 2019-06-26 DIAGNOSIS — E782 Mixed hyperlipidemia: Secondary | ICD-10-CM | POA: Diagnosis not present

## 2019-06-26 DIAGNOSIS — R4701 Aphasia: Secondary | ICD-10-CM | POA: Diagnosis not present

## 2019-06-26 DIAGNOSIS — E875 Hyperkalemia: Secondary | ICD-10-CM | POA: Diagnosis not present

## 2019-06-26 DIAGNOSIS — Z8673 Personal history of transient ischemic attack (TIA), and cerebral infarction without residual deficits: Secondary | ICD-10-CM | POA: Diagnosis not present

## 2019-06-26 DIAGNOSIS — I6529 Occlusion and stenosis of unspecified carotid artery: Secondary | ICD-10-CM | POA: Diagnosis not present

## 2019-06-26 DIAGNOSIS — M19049 Primary osteoarthritis, unspecified hand: Secondary | ICD-10-CM | POA: Diagnosis not present

## 2019-06-26 DIAGNOSIS — M25569 Pain in unspecified knee: Secondary | ICD-10-CM | POA: Diagnosis not present

## 2019-06-26 DIAGNOSIS — M1990 Unspecified osteoarthritis, unspecified site: Secondary | ICD-10-CM | POA: Diagnosis not present

## 2019-06-26 DIAGNOSIS — M79669 Pain in unspecified lower leg: Secondary | ICD-10-CM | POA: Diagnosis not present

## 2019-06-26 DIAGNOSIS — R519 Headache, unspecified: Secondary | ICD-10-CM | POA: Diagnosis not present

## 2019-06-26 DIAGNOSIS — E871 Hypo-osmolality and hyponatremia: Secondary | ICD-10-CM | POA: Diagnosis not present

## 2019-06-26 DIAGNOSIS — H9319 Tinnitus, unspecified ear: Secondary | ICD-10-CM | POA: Diagnosis not present

## 2019-06-26 DIAGNOSIS — E039 Hypothyroidism, unspecified: Secondary | ICD-10-CM | POA: Diagnosis not present

## 2019-07-01 DIAGNOSIS — E871 Hypo-osmolality and hyponatremia: Secondary | ICD-10-CM | POA: Diagnosis not present

## 2019-07-01 DIAGNOSIS — I10 Essential (primary) hypertension: Secondary | ICD-10-CM | POA: Diagnosis not present

## 2019-07-10 DIAGNOSIS — I10 Essential (primary) hypertension: Secondary | ICD-10-CM | POA: Diagnosis not present

## 2019-07-10 DIAGNOSIS — K219 Gastro-esophageal reflux disease without esophagitis: Secondary | ICD-10-CM | POA: Diagnosis not present

## 2019-07-10 DIAGNOSIS — E039 Hypothyroidism, unspecified: Secondary | ICD-10-CM | POA: Diagnosis not present

## 2019-07-10 DIAGNOSIS — E785 Hyperlipidemia, unspecified: Secondary | ICD-10-CM | POA: Diagnosis not present

## 2019-07-22 DIAGNOSIS — I10 Essential (primary) hypertension: Secondary | ICD-10-CM | POA: Diagnosis not present

## 2019-07-22 DIAGNOSIS — G451 Carotid artery syndrome (hemispheric): Secondary | ICD-10-CM | POA: Diagnosis not present

## 2019-07-22 DIAGNOSIS — G4733 Obstructive sleep apnea (adult) (pediatric): Secondary | ICD-10-CM | POA: Diagnosis not present

## 2019-07-22 DIAGNOSIS — E7849 Other hyperlipidemia: Secondary | ICD-10-CM | POA: Diagnosis not present

## 2019-07-30 DIAGNOSIS — H26493 Other secondary cataract, bilateral: Secondary | ICD-10-CM | POA: Diagnosis not present

## 2019-07-30 DIAGNOSIS — Z961 Presence of intraocular lens: Secondary | ICD-10-CM | POA: Diagnosis not present

## 2019-08-10 DIAGNOSIS — K219 Gastro-esophageal reflux disease without esophagitis: Secondary | ICD-10-CM | POA: Diagnosis not present

## 2019-08-10 DIAGNOSIS — I10 Essential (primary) hypertension: Secondary | ICD-10-CM | POA: Diagnosis not present

## 2019-08-10 DIAGNOSIS — E039 Hypothyroidism, unspecified: Secondary | ICD-10-CM | POA: Diagnosis not present

## 2019-08-10 DIAGNOSIS — E785 Hyperlipidemia, unspecified: Secondary | ICD-10-CM | POA: Diagnosis not present

## 2019-08-14 ENCOUNTER — Ambulatory Visit (HOSPITAL_COMMUNITY)
Admission: RE | Admit: 2019-08-14 | Discharge: 2019-08-14 | Disposition: A | Discharge status: Home / Self Care | Payer: PPO | Source: Ambulatory Visit | Attending: Neurology | Admitting: Neurology

## 2019-08-14 ENCOUNTER — Other Ambulatory Visit: Payer: Self-pay

## 2019-08-14 DIAGNOSIS — R569 Unspecified convulsions: Secondary | ICD-10-CM | POA: Diagnosis not present

## 2019-08-14 DIAGNOSIS — R400 Somnolence: Secondary | ICD-10-CM | POA: Insufficient documentation

## 2019-08-14 DIAGNOSIS — Z7982 Long term (current) use of aspirin: Secondary | ICD-10-CM | POA: Insufficient documentation

## 2019-08-14 DIAGNOSIS — Z79899 Other long term (current) drug therapy: Secondary | ICD-10-CM | POA: Diagnosis not present

## 2019-08-14 DIAGNOSIS — Z7901 Long term (current) use of anticoagulants: Secondary | ICD-10-CM | POA: Insufficient documentation

## 2019-08-14 NOTE — Progress Notes (Signed)
EEG complete - results pending 

## 2019-08-14 NOTE — Progress Notes (Signed)
Patient experienced a fall after EEG was completed. Pt was holding side rail and walking towards [place] when she tripped over feet. Tech tried to grab pt but was unable to stop fall but did slow it slightly. Pt came down on left knee. Patient declined to go to ER stated she was fine. Safety Zone was completed.

## 2019-08-14 NOTE — Procedures (Signed)
  Helen A. [name], MD     [email or web address]           HISTORY: This is a 74-year-old female who has recurrent episodes of confusion worrisome for complex partial seizures.  MEDICATIONS:  Current Outpatient Medications:  .  aspirin 325 MG tablet, Take 1 tablet (325 mg total) by mouth daily with breakfast., Disp: 30 tablet, Rfl: 3 .  atorvastatin (LIPITOR) 80 MG tablet, Take 1 tablet (80 mg total) by mouth every evening., Disp: 30 tablet, Rfl: 11 .  Bacillus Coagulans-Inulin (PROBIOTIC FORMULA PO), Take 1 capsule by mouth every evening., Disp: , Rfl:  .  DULoxetine (CYMBALTA) 60 MG capsule, Take 60 mg by mouth every morning., Disp: , Rfl:  .  levothyroxine (SYNTHROID) 50 MCG tablet, Take 50 mcg by mouth daily before breakfast. , Disp: , Rfl:  .  losartan (COZAAR) 50 MG tablet, Take 1 tablet (50 mg total) by mouth daily., Disp: 30 tablet, Rfl: 3 .  MAGNESIUM PO, Take 1 tablet by mouth every evening., Disp: , Rfl:  .  nebivolol (BYSTOLIC) 10 MG tablet, Take 1 tablet (10 mg total) by mouth at bedtime., Disp: 30 tablet, Rfl: 3 .  omeprazole (PRILOSEC) 20 MG capsule, Take 1 capsule (20 mg total) by mouth daily. While taking anti inflammatory medicine daily, Disp: 30 capsule, Rfl: 2 .  senna-docusate (SENOKOT-S) 8.6-50 MG tablet, Take 2 tablets by mouth at bedtime as needed for mild constipation., Disp: 60 tablet, Rfl: 0     ANALYSIS: A 16 channel recording using standard 10 20 measurements is conducted for 24 minutes.  There is a well-formed posterior dominant rhythm of 24 hertz which attenuates with eye-opening. There is beta activity observed in the frontal areas. Awake and drowsy architecture are observed. Photic stimulation is carried out without abnormal changes in the background activity. There is no focal or lateralized slowing. There is no epileptiform activities noted.    IMPRESSION: 1. This is a normal recording awake and drowsy states.      [name] A.  Merlene [name], M.D.  Diplomate, Tax adviser of Psychiatry and Neurology ( Neurology).

## 2019-08-17 DIAGNOSIS — Z96652 Presence of left artificial knee joint: Secondary | ICD-10-CM | POA: Diagnosis not present

## 2019-09-10 DIAGNOSIS — K219 Gastro-esophageal reflux disease without esophagitis: Secondary | ICD-10-CM | POA: Diagnosis not present

## 2019-09-10 DIAGNOSIS — M19049 Primary osteoarthritis, unspecified hand: Secondary | ICD-10-CM | POA: Diagnosis not present

## 2019-09-10 DIAGNOSIS — E785 Hyperlipidemia, unspecified: Secondary | ICD-10-CM | POA: Diagnosis not present

## 2019-09-10 DIAGNOSIS — E875 Hyperkalemia: Secondary | ICD-10-CM | POA: Diagnosis not present

## 2019-09-10 DIAGNOSIS — I6529 Occlusion and stenosis of unspecified carotid artery: Secondary | ICD-10-CM | POA: Diagnosis not present

## 2019-09-10 DIAGNOSIS — F331 Major depressive disorder, recurrent, moderate: Secondary | ICD-10-CM | POA: Diagnosis not present

## 2019-09-10 DIAGNOSIS — E039 Hypothyroidism, unspecified: Secondary | ICD-10-CM | POA: Diagnosis not present

## 2019-09-10 DIAGNOSIS — H9319 Tinnitus, unspecified ear: Secondary | ICD-10-CM | POA: Diagnosis not present

## 2019-09-10 DIAGNOSIS — G3184 Mild cognitive impairment, so stated: Secondary | ICD-10-CM | POA: Diagnosis not present

## 2019-09-10 DIAGNOSIS — E871 Hypo-osmolality and hyponatremia: Secondary | ICD-10-CM | POA: Diagnosis not present

## 2019-09-10 DIAGNOSIS — I10 Essential (primary) hypertension: Secondary | ICD-10-CM | POA: Diagnosis not present

## 2019-09-10 DIAGNOSIS — E782 Mixed hyperlipidemia: Secondary | ICD-10-CM | POA: Diagnosis not present

## 2019-09-11 DIAGNOSIS — I10 Essential (primary) hypertension: Secondary | ICD-10-CM | POA: Diagnosis not present

## 2019-09-11 DIAGNOSIS — E039 Hypothyroidism, unspecified: Secondary | ICD-10-CM | POA: Diagnosis not present

## 2019-09-11 DIAGNOSIS — E785 Hyperlipidemia, unspecified: Secondary | ICD-10-CM | POA: Diagnosis not present

## 2019-09-11 DIAGNOSIS — K219 Gastro-esophageal reflux disease without esophagitis: Secondary | ICD-10-CM | POA: Diagnosis not present

## 2019-09-14 DIAGNOSIS — Z96652 Presence of left artificial knee joint: Secondary | ICD-10-CM | POA: Diagnosis not present

## 2019-09-15 ENCOUNTER — Other Ambulatory Visit (HOSPITAL_COMMUNITY): Payer: Self-pay | Admitting: Internal Medicine

## 2019-09-15 DIAGNOSIS — Z8673 Personal history of transient ischemic attack (TIA), and cerebral infarction without residual deficits: Secondary | ICD-10-CM | POA: Diagnosis not present

## 2019-09-15 DIAGNOSIS — K219 Gastro-esophageal reflux disease without esophagitis: Secondary | ICD-10-CM | POA: Diagnosis not present

## 2019-09-15 DIAGNOSIS — E875 Hyperkalemia: Secondary | ICD-10-CM | POA: Diagnosis not present

## 2019-09-15 DIAGNOSIS — F331 Major depressive disorder, recurrent, moderate: Secondary | ICD-10-CM | POA: Diagnosis not present

## 2019-09-15 DIAGNOSIS — E782 Mixed hyperlipidemia: Secondary | ICD-10-CM | POA: Diagnosis not present

## 2019-09-15 DIAGNOSIS — Z0001 Encounter for general adult medical examination with abnormal findings: Secondary | ICD-10-CM | POA: Diagnosis not present

## 2019-09-15 DIAGNOSIS — M810 Age-related osteoporosis without current pathological fracture: Secondary | ICD-10-CM

## 2019-09-15 DIAGNOSIS — M1990 Unspecified osteoarthritis, unspecified site: Secondary | ICD-10-CM | POA: Diagnosis not present

## 2019-09-15 DIAGNOSIS — E039 Hypothyroidism, unspecified: Secondary | ICD-10-CM | POA: Diagnosis not present

## 2019-09-15 DIAGNOSIS — I10 Essential (primary) hypertension: Secondary | ICD-10-CM | POA: Diagnosis not present

## 2019-09-15 DIAGNOSIS — M25562 Pain in left knee: Secondary | ICD-10-CM | POA: Diagnosis not present

## 2019-09-15 DIAGNOSIS — E871 Hypo-osmolality and hyponatremia: Secondary | ICD-10-CM | POA: Diagnosis not present

## 2019-09-15 DIAGNOSIS — E785 Hyperlipidemia, unspecified: Secondary | ICD-10-CM | POA: Diagnosis not present

## 2019-09-16 DIAGNOSIS — E782 Mixed hyperlipidemia: Secondary | ICD-10-CM | POA: Diagnosis not present

## 2019-09-16 DIAGNOSIS — G4733 Obstructive sleep apnea (adult) (pediatric): Secondary | ICD-10-CM | POA: Diagnosis not present

## 2019-09-16 DIAGNOSIS — G458 Other transient cerebral ischemic attacks and related syndromes: Secondary | ICD-10-CM | POA: Diagnosis not present

## 2019-09-16 DIAGNOSIS — I10 Essential (primary) hypertension: Secondary | ICD-10-CM | POA: Diagnosis not present

## 2019-09-22 DIAGNOSIS — R262 Difficulty in walking, not elsewhere classified: Secondary | ICD-10-CM | POA: Diagnosis not present

## 2019-09-22 DIAGNOSIS — Z9181 History of falling: Secondary | ICD-10-CM | POA: Diagnosis not present

## 2019-09-22 DIAGNOSIS — M6281 Muscle weakness (generalized): Secondary | ICD-10-CM | POA: Diagnosis not present

## 2019-09-22 DIAGNOSIS — M25562 Pain in left knee: Secondary | ICD-10-CM | POA: Diagnosis not present

## 2019-09-23 ENCOUNTER — Other Ambulatory Visit (HOSPITAL_COMMUNITY): Payer: Self-pay | Admitting: Adult Health Nurse Practitioner

## 2019-09-23 DIAGNOSIS — Z1231 Encounter for screening mammogram for malignant neoplasm of breast: Secondary | ICD-10-CM

## 2019-09-29 DIAGNOSIS — M25562 Pain in left knee: Secondary | ICD-10-CM | POA: Diagnosis not present

## 2019-09-29 DIAGNOSIS — R262 Difficulty in walking, not elsewhere classified: Secondary | ICD-10-CM | POA: Diagnosis not present

## 2019-09-29 DIAGNOSIS — M6281 Muscle weakness (generalized): Secondary | ICD-10-CM | POA: Diagnosis not present

## 2019-10-02 ENCOUNTER — Ambulatory Visit (HOSPITAL_COMMUNITY)
Admission: RE | Admit: 2019-10-02 | Discharge: 2019-10-02 | Disposition: A | Discharge status: Home / Self Care | Payer: PPO | Source: Ambulatory Visit | Attending: Internal Medicine | Admitting: Internal Medicine

## 2019-10-02 ENCOUNTER — Ambulatory Visit (HOSPITAL_COMMUNITY)
Admission: RE | Admit: 2019-10-02 | Discharge: 2019-10-02 | Disposition: A | Discharge status: Home / Self Care | Payer: PPO | Source: Ambulatory Visit | Attending: Adult Health Nurse Practitioner | Admitting: Adult Health Nurse Practitioner

## 2019-10-02 ENCOUNTER — Other Ambulatory Visit: Payer: Self-pay

## 2019-10-02 DIAGNOSIS — M810 Age-related osteoporosis without current pathological fracture: Secondary | ICD-10-CM | POA: Insufficient documentation

## 2019-10-02 DIAGNOSIS — Z1231 Encounter for screening mammogram for malignant neoplasm of breast: Secondary | ICD-10-CM | POA: Diagnosis not present

## 2019-10-05 DIAGNOSIS — E039 Hypothyroidism, unspecified: Secondary | ICD-10-CM | POA: Diagnosis not present

## 2019-10-05 DIAGNOSIS — K219 Gastro-esophageal reflux disease without esophagitis: Secondary | ICD-10-CM | POA: Diagnosis not present

## 2019-10-05 DIAGNOSIS — E785 Hyperlipidemia, unspecified: Secondary | ICD-10-CM | POA: Diagnosis not present

## 2019-10-05 DIAGNOSIS — I10 Essential (primary) hypertension: Secondary | ICD-10-CM | POA: Diagnosis not present

## 2019-10-05 DIAGNOSIS — Z0001 Encounter for general adult medical examination with abnormal findings: Secondary | ICD-10-CM | POA: Diagnosis not present

## 2019-10-06 DIAGNOSIS — R262 Difficulty in walking, not elsewhere classified: Secondary | ICD-10-CM | POA: Diagnosis not present

## 2019-10-06 DIAGNOSIS — M810 Age-related osteoporosis without current pathological fracture: Secondary | ICD-10-CM | POA: Diagnosis not present

## 2019-10-06 DIAGNOSIS — M25562 Pain in left knee: Secondary | ICD-10-CM | POA: Diagnosis not present

## 2019-10-06 DIAGNOSIS — I10 Essential (primary) hypertension: Secondary | ICD-10-CM | POA: Diagnosis not present

## 2019-10-06 DIAGNOSIS — M6281 Muscle weakness (generalized): Secondary | ICD-10-CM | POA: Diagnosis not present

## 2019-10-08 DIAGNOSIS — M25562 Pain in left knee: Secondary | ICD-10-CM | POA: Diagnosis not present

## 2019-10-08 DIAGNOSIS — M6281 Muscle weakness (generalized): Secondary | ICD-10-CM | POA: Diagnosis not present

## 2019-10-08 DIAGNOSIS — R262 Difficulty in walking, not elsewhere classified: Secondary | ICD-10-CM | POA: Diagnosis not present

## 2019-10-13 DIAGNOSIS — M25562 Pain in left knee: Secondary | ICD-10-CM | POA: Diagnosis not present

## 2019-10-13 DIAGNOSIS — R262 Difficulty in walking, not elsewhere classified: Secondary | ICD-10-CM | POA: Diagnosis not present

## 2019-10-13 DIAGNOSIS — M6281 Muscle weakness (generalized): Secondary | ICD-10-CM | POA: Diagnosis not present

## 2019-10-16 DIAGNOSIS — M25562 Pain in left knee: Secondary | ICD-10-CM | POA: Diagnosis not present

## 2019-10-16 DIAGNOSIS — M6281 Muscle weakness (generalized): Secondary | ICD-10-CM | POA: Diagnosis not present

## 2019-10-16 DIAGNOSIS — Z9181 History of falling: Secondary | ICD-10-CM | POA: Diagnosis not present

## 2019-10-16 DIAGNOSIS — R262 Difficulty in walking, not elsewhere classified: Secondary | ICD-10-CM | POA: Diagnosis not present

## 2019-10-20 ENCOUNTER — Encounter (HOSPITAL_COMMUNITY): Payer: Self-pay

## 2019-10-22 ENCOUNTER — Encounter (HOSPITAL_COMMUNITY): Payer: Self-pay

## 2019-10-22 ENCOUNTER — Encounter (HOSPITAL_COMMUNITY)
Admission: RE | Admit: 2019-10-22 | Discharge: 2019-10-22 | Disposition: A | Discharge status: Home / Self Care | Payer: PPO | Source: Ambulatory Visit | Attending: Internal Medicine | Admitting: Internal Medicine

## 2019-10-22 ENCOUNTER — Other Ambulatory Visit: Payer: Self-pay

## 2019-10-22 DIAGNOSIS — M810 Age-related osteoporosis without current pathological fracture: Secondary | ICD-10-CM | POA: Insufficient documentation

## 2019-10-22 HISTORY — DX: Pure hypercholesterolemia, unspecified: E78.00

## 2019-10-22 HISTORY — DX: Age-related osteoporosis without current pathological fracture: M81.0

## 2019-10-22 MED ORDER — DENOSUMAB 60 MG/ML ~~LOC~~ SOSY
60.0000 mg | PREFILLED_SYRINGE | Freq: Once | SUBCUTANEOUS | Status: AC
  Administered 2019-10-22: 60 mg via SUBCUTANEOUS

## 2019-11-04 DIAGNOSIS — E785 Hyperlipidemia, unspecified: Secondary | ICD-10-CM | POA: Diagnosis not present

## 2019-11-04 DIAGNOSIS — E039 Hypothyroidism, unspecified: Secondary | ICD-10-CM | POA: Diagnosis not present

## 2019-11-04 DIAGNOSIS — I10 Essential (primary) hypertension: Secondary | ICD-10-CM | POA: Diagnosis not present

## 2019-11-04 DIAGNOSIS — K219 Gastro-esophageal reflux disease without esophagitis: Secondary | ICD-10-CM | POA: Diagnosis not present

## 2019-11-05 ENCOUNTER — Encounter: Payer: Self-pay | Admitting: Cardiology

## 2019-11-05 ENCOUNTER — Other Ambulatory Visit: Payer: Self-pay

## 2019-11-05 ENCOUNTER — Ambulatory Visit (INDEPENDENT_AMBULATORY_CARE_PROVIDER_SITE_OTHER): Payer: PPO | Admitting: Cardiology

## 2019-11-05 VITALS — BP 132/76 | HR 76 | Ht 60.0 in | Wt 194.0 lb

## 2019-11-05 DIAGNOSIS — I10 Essential (primary) hypertension: Secondary | ICD-10-CM | POA: Diagnosis not present

## 2019-11-05 DIAGNOSIS — E782 Mixed hyperlipidemia: Secondary | ICD-10-CM | POA: Diagnosis not present

## 2019-11-05 MED ORDER — AMLODIPINE BESYLATE 10 MG PO TABS: 10.0000 mg | ORAL_TABLET | Freq: Every day | ORAL | 6 refills | Status: DC

## 2019-11-05 NOTE — Patient Instructions (Addendum)
Medication Instructions:   Increase Amlodipine to 10mg  daily.   Continue all other medications.    Labwork: none  Testing/Procedures: none  Follow-Up: 6 months   Any Other Special Instructions Will Be Listed Below (If Applicable). Your physician has requested that you regularly monitor and record your blood pressure readings at home. Please use the same machine at the same time of day to check your readings and record them.  Return log to office in 1 week for provider review.   If you need a refill on your cardiac medications before your next appointment, please call your pharmacy.

## 2019-11-05 NOTE — Progress Notes (Signed)
Clinical Summary [name] is a 74 y.o.female seen today for follow up of the following medical problems.  1.HTN - off HCTZ due to low Na during Jan 2021 admission for TIA. Norvasc 10mg  was also stopped at that time to allower permissive HTN, eventually restarted at lower dose of 5mg  daily - home bp's 120s-150s/70s-90s   2. TIA - admission Jan 2021 - on high dose ASA, statin  3. Hyperlipidemia - 09/2019 TC 139 TG 54 HDL 72 LDL 55 - compliant with statin    Past Medical History:  Diagnosis Date  . Anemia   . Anxiety   . Arthritis   . Complication of anesthesia    "pt. holds her breath when waking up"  . Depression   . GERD (gastroesophageal reflux disease)   . Hypercholesteremia   . Hypertension   . Hypothyroidism   . Osteoporosis   . PONV (postoperative nausea and vomiting)   . Staph infection    abdomen and returned 5 yrs. later     Allergies  Allergen Reactions  . Sulfamethoxazole Swelling    Lips swell and feel numb     Current Outpatient Medications  Medication Sig Dispense Refill  . aspirin 325 MG tablet Take 1 tablet (325 mg total) by mouth daily with breakfast. 30 tablet 3  . atorvastatin (LIPITOR) 80 MG tablet Take 1 tablet (80 mg total) by mouth every evening. (Patient not taking: Reported on 10/20/2019) 30 tablet 11  . Bacillus Coagulans-Inulin (PROBIOTIC FORMULA PO) Take 1 capsule by mouth every evening.    . denosumab (PROLIA) 60 MG/ML SOSY injection Inject 60 mg into the skin every 6 (six) months.    . diclofenac (VOLTAREN) 75 MG EC tablet Take 75 mg by mouth 2 (two) times daily.    Marland Kitchen docusate sodium (COLACE) 250 MG capsule Take 250 mg by mouth daily.    . DULoxetine (CYMBALTA) 60 MG capsule Take 60 mg by mouth every morning.    Marland Kitchen levothyroxine (SYNTHROID) 50 MCG tablet Take 50 mcg by mouth daily before breakfast.     . losartan (COZAAR) 50 MG tablet Take 1 tablet (50 mg total) by mouth daily. (Patient taking differently: Take 100 mg by  mouth daily. ) 30 tablet 3  . MAGNESIUM PO Take 1 tablet by mouth every evening.    . nebivolol (BYSTOLIC) 10 MG tablet Take 1 tablet (10 mg total) by mouth at bedtime. 30 tablet 3  . omeprazole (PRILOSEC) 20 MG capsule Take 1 capsule (20 mg total) by mouth daily. While taking anti inflammatory medicine daily 30 capsule 2  . senna-docusate (SENOKOT-S) 8.6-50 MG tablet Take 2 tablets by mouth at bedtime as needed for mild constipation. (Patient not taking: Reported on 10/20/2019) 60 tablet 0   No current facility-administered medications for this visit.     Past Surgical History:  Procedure Laterality Date  . ABDOMINAL HYSTERECTOMY    . CATARACT EXTRACTION W/PHACO Right 01/22/2019   Procedure: CATARACT EXTRACTION PHACO AND INTRAOCULAR LENS PLACEMENT (IOC);  Surgeon: [name], MD;  Location: [facility];  Service: Ophthalmology;  Laterality: Right;  right, CDE: 19.63  . CATARACT EXTRACTION W/PHACO Left 02/16/2019   Procedure: CATARACT EXTRACTION PHACO AND INTRAOCULAR LENS PLACEMENT LEFT EYE (CDE: 14.36);  Surgeon: [name], MD;  Location: [facility];  Service: Ophthalmology;  Laterality: Left;  . CERVICAL FUSION  1990's  . COLON SURGERY    . FOOT SURGERY Left    reshaped  . HERNIA REPAIR    .  HIP ARTHROPLASTY Left   . JOINT REPLACEMENT Right    hip  . LUMBAR SPINE SURGERY    . REPLACEMENT TOTAL KNEE Right   . THYROIDECTOMY, PARTIAL    . TOTAL KNEE ARTHROPLASTY Left 08/21/2016   Procedure: LEFT TOTAL KNEE ARTHROPLASTY;  Surgeon: Renette Butters, MD;  Location: [place];  Service: Orthopedics;  Laterality: Left;     Allergies  Allergen Reactions  . Sulfamethoxazole Swelling    Lips swell and feel numb      No family history on file.   Social History [name] reports that she has never smoked. She has never used smokeless tobacco. [name] reports no history of alcohol use.   Review of Systems CONSTITUTIONAL: No weight loss, fever, chills, weakness or fatigue.  HEENT:  Eyes: No visual loss, blurred vision, double vision or yellow sclerae.No hearing loss, sneezing, congestion, runny nose or sore throat.  SKIN: No rash or itching.  CARDIOVASCULAR: per hpi RESPIRATORY: No shortness of breath, cough or sputum.  GASTROINTESTINAL: No anorexia, nausea, vomiting or diarrhea. No abdominal pain or blood.  GENITOURINARY: No burning on urination, no polyuria NEUROLOGICAL: No headache, dizziness, syncope, paralysis, ataxia, numbness or tingling in the extremities. No change in bowel or bladder control.  MUSCULOSKELETAL: No muscle, back pain, joint pain or stiffness.  LYMPHATICS: No enlarged nodes. No history of splenectomy.  PSYCHIATRIC: No history of depression or anxiety.  ENDOCRINOLOGIC: No reports of sweating, cold or heat intolerance. No polyuria or polydipsia.  Marland Kitchen   Physical Examination Today's Vitals   11/05/19 0907  BP: 132/76  Pulse: 76  SpO2: 95%  Weight: 194 lb (88 kg)  Height: 5' (1.524 m)   Body mass index is 37.89 kg/m.  Gen: resting comfortably, no acute distress HEENT: no scleral icterus, pupils equal round and reactive, no palptable cervical adenopathy,  CV: RRR, no mr/g, no jvd Resp: Clear to auscultation bilaterally GI: abdomen is soft, non-tender, non-distended, normal bowel sounds, no hepatosplenomegaly MSK: extremities are warm, no edema.  Skin: warm, no rash Neuro:  no focal deficits Psych: appropriate affect   Diagnostic Studies  03/2017 echo Study Conclusions  - Left ventricle: The cavity size was normal. Systolic function was normal. The estimated ejection fraction was in the range of 55% to 60%. Wall motion was normal; there were no regional wall motion abnormalities. Doppler parameters are consistent with abnormal left ventricular relaxation (grade 1 diastolic dysfunction). - Mitral valve: There was moderate regurgitation. - Atrial septum: No defect or patent foramen ovale was identified. - Tricuspid valve:  There was mild regurgitation. - Pulmonic valve: There was mild regurgitation. - Pulmonary arteries: PA peak pressure: 41 mm Hg (S). - Systemic veins: The IVC was suboptimally visualized, but appeared dilated. There was normal respiratory variation. Estimated RAP 8 mmHg.   Assessment and Plan   1. HTN -bp above goal on average of <130/80 - increase norvasc to 10mg  daily, bp log x 1 week  2. Hyperlipidemia - at goal, continue statin  3. TIA - high dose ASA per neuro - she is on statin    Arnoldo Lenis, M.D

## 2019-11-10 ENCOUNTER — Telehealth: Payer: Self-pay | Admitting: Cardiology

## 2019-11-10 NOTE — Telephone Encounter (Signed)
6/3 159/87    6/4 121/72 6/5 117/73 6/6 137/75 6/7 125-71  Was told to take 5 blood pressure readings. States that her HR ran 62 to 10

## 2019-11-10 NOTE — Telephone Encounter (Signed)
Isolated high bp that coincides the day we changed her medication, f/u bp's look good. No additional changes   Zandra Abts MD

## 2019-11-10 NOTE — Telephone Encounter (Signed)
Patient informed and verbalized understanding of plan. 

## 2019-11-19 DIAGNOSIS — M180 Bilateral primary osteoarthritis of first carpometacarpal joints: Secondary | ICD-10-CM | POA: Diagnosis not present

## 2019-11-19 DIAGNOSIS — M1990 Unspecified osteoarthritis, unspecified site: Secondary | ICD-10-CM | POA: Diagnosis not present

## 2019-11-19 DIAGNOSIS — M4004 Postural kyphosis, thoracic region: Secondary | ICD-10-CM | POA: Diagnosis not present

## 2019-11-19 DIAGNOSIS — Z7409 Other reduced mobility: Secondary | ICD-10-CM | POA: Diagnosis not present

## 2019-12-10 DIAGNOSIS — I10 Essential (primary) hypertension: Secondary | ICD-10-CM | POA: Diagnosis not present

## 2019-12-10 DIAGNOSIS — E785 Hyperlipidemia, unspecified: Secondary | ICD-10-CM | POA: Diagnosis not present

## 2019-12-10 DIAGNOSIS — K219 Gastro-esophageal reflux disease without esophagitis: Secondary | ICD-10-CM | POA: Diagnosis not present

## 2019-12-10 DIAGNOSIS — E039 Hypothyroidism, unspecified: Secondary | ICD-10-CM | POA: Diagnosis not present

## 2019-12-14 DIAGNOSIS — I10 Essential (primary) hypertension: Secondary | ICD-10-CM | POA: Diagnosis not present

## 2019-12-14 DIAGNOSIS — G458 Other transient cerebral ischemic attacks and related syndromes: Secondary | ICD-10-CM | POA: Diagnosis not present

## 2019-12-14 DIAGNOSIS — G4733 Obstructive sleep apnea (adult) (pediatric): Secondary | ICD-10-CM | POA: Diagnosis not present

## 2019-12-14 DIAGNOSIS — E782 Mixed hyperlipidemia: Secondary | ICD-10-CM | POA: Diagnosis not present

## 2019-12-14 DIAGNOSIS — Z8659 Personal history of other mental and behavioral disorders: Secondary | ICD-10-CM | POA: Diagnosis not present

## 2020-01-01 DIAGNOSIS — I10 Essential (primary) hypertension: Secondary | ICD-10-CM | POA: Diagnosis not present

## 2020-01-01 DIAGNOSIS — E785 Hyperlipidemia, unspecified: Secondary | ICD-10-CM | POA: Diagnosis not present

## 2020-01-01 DIAGNOSIS — K219 Gastro-esophageal reflux disease without esophagitis: Secondary | ICD-10-CM | POA: Diagnosis not present

## 2020-01-01 DIAGNOSIS — E039 Hypothyroidism, unspecified: Secondary | ICD-10-CM | POA: Diagnosis not present

## 2020-01-04 DIAGNOSIS — I10 Essential (primary) hypertension: Secondary | ICD-10-CM | POA: Diagnosis not present

## 2020-01-04 DIAGNOSIS — E039 Hypothyroidism, unspecified: Secondary | ICD-10-CM | POA: Diagnosis not present

## 2020-01-04 DIAGNOSIS — E785 Hyperlipidemia, unspecified: Secondary | ICD-10-CM | POA: Diagnosis not present

## 2020-01-04 DIAGNOSIS — K219 Gastro-esophageal reflux disease without esophagitis: Secondary | ICD-10-CM | POA: Diagnosis not present

## 2020-02-01 ENCOUNTER — Emergency Department (HOSPITAL_COMMUNITY): Payer: PPO

## 2020-02-01 ENCOUNTER — Other Ambulatory Visit: Payer: Self-pay

## 2020-02-01 ENCOUNTER — Emergency Department (HOSPITAL_COMMUNITY)
Admission: EM | Admit: 2020-02-01 | Discharge: 2020-02-01 | Disposition: A | Discharge status: Home / Self Care | Payer: PPO | Attending: Emergency Medicine | Admitting: Emergency Medicine

## 2020-02-01 ENCOUNTER — Encounter (HOSPITAL_COMMUNITY): Payer: Self-pay

## 2020-02-01 DIAGNOSIS — R0602 Shortness of breath: Secondary | ICD-10-CM | POA: Diagnosis not present

## 2020-02-01 DIAGNOSIS — I10 Essential (primary) hypertension: Secondary | ICD-10-CM | POA: Diagnosis not present

## 2020-02-01 DIAGNOSIS — Y999 Unspecified external cause status: Secondary | ICD-10-CM | POA: Diagnosis not present

## 2020-02-01 DIAGNOSIS — Z20822 Contact with and (suspected) exposure to covid-19: Secondary | ICD-10-CM | POA: Diagnosis not present

## 2020-02-01 DIAGNOSIS — E871 Hypo-osmolality and hyponatremia: Secondary | ICD-10-CM | POA: Diagnosis not present

## 2020-02-01 DIAGNOSIS — Y929 Unspecified place or not applicable: Secondary | ICD-10-CM | POA: Insufficient documentation

## 2020-02-01 DIAGNOSIS — Z96651 Presence of right artificial knee joint: Secondary | ICD-10-CM | POA: Diagnosis not present

## 2020-02-01 DIAGNOSIS — S0990XA Unspecified injury of head, initial encounter: Secondary | ICD-10-CM | POA: Diagnosis not present

## 2020-02-01 DIAGNOSIS — W01198A Fall on same level from slipping, tripping and stumbling with subsequent striking against other object, initial encounter: Secondary | ICD-10-CM | POA: Insufficient documentation

## 2020-02-01 DIAGNOSIS — Z7982 Long term (current) use of aspirin: Secondary | ICD-10-CM | POA: Diagnosis not present

## 2020-02-01 DIAGNOSIS — E039 Hypothyroidism, unspecified: Secondary | ICD-10-CM | POA: Diagnosis not present

## 2020-02-01 DIAGNOSIS — Y939 Activity, unspecified: Secondary | ICD-10-CM | POA: Insufficient documentation

## 2020-02-01 DIAGNOSIS — S0083XA Contusion of other part of head, initial encounter: Secondary | ICD-10-CM | POA: Diagnosis not present

## 2020-02-01 DIAGNOSIS — R531 Weakness: Secondary | ICD-10-CM | POA: Diagnosis not present

## 2020-02-01 DIAGNOSIS — Z79899 Other long term (current) drug therapy: Secondary | ICD-10-CM | POA: Insufficient documentation

## 2020-02-01 LAB — URINALYSIS, ROUTINE W REFLEX MICROSCOPIC
Bilirubin Urine: NEGATIVE
Glucose, UA: NEGATIVE mg/dL
Hgb urine dipstick: NEGATIVE
Ketones, ur: NEGATIVE mg/dL
Leukocytes,Ua: NEGATIVE
Nitrite: NEGATIVE
Protein, ur: NEGATIVE mg/dL
Specific Gravity, Urine: 1.002 — ABNORMAL LOW (ref 1.005–1.030)
pH: 6 (ref 5.0–8.0)

## 2020-02-01 LAB — CBC WITH DIFFERENTIAL/PLATELET
Abs Immature Granulocytes: 0.01 10*3/uL (ref 0.00–0.07)
Basophils Absolute: 0 10*3/uL (ref 0.0–0.1)
Basophils Relative: 1 %
Eosinophils Absolute: 0.1 10*3/uL (ref 0.0–0.5)
Eosinophils Relative: 2 %
HCT: 38.2 % (ref 36.0–46.0)
Hemoglobin: 12.5 g/dL (ref 12.0–15.0)
Immature Granulocytes: 0 %
Lymphocytes Relative: 25 %
Lymphs Abs: 1.5 10*3/uL (ref 0.7–4.0)
MCH: 29.1 pg (ref 26.0–34.0)
MCHC: 32.7 g/dL (ref 30.0–36.0)
MCV: 89 fL (ref 80.0–100.0)
Monocytes Absolute: 0.5 10*3/uL (ref 0.1–1.0)
Monocytes Relative: 8 %
Neutro Abs: 3.9 10*3/uL (ref 1.7–7.7)
Neutrophils Relative %: 64 %
Platelets: 213 10*3/uL (ref 150–400)
RBC: 4.29 MIL/uL (ref 3.87–5.11)
RDW: 14.2 % (ref 11.5–15.5)
WBC: 6.1 10*3/uL (ref 4.0–10.5)
nRBC: 0 % (ref 0.0–0.2)

## 2020-02-01 LAB — COMPREHENSIVE METABOLIC PANEL
ALT: 19 U/L (ref 0–44)
AST: 25 U/L (ref 15–41)
Albumin: 4.2 g/dL (ref 3.5–5.0)
Alkaline Phosphatase: 41 U/L (ref 38–126)
Anion gap: 12 (ref 5–15)
BUN: 12 mg/dL (ref 8–23)
CO2: 19 mmol/L — ABNORMAL LOW (ref 22–32)
Calcium: 9.2 mg/dL (ref 8.9–10.3)
Chloride: 100 mmol/L (ref 98–111)
Creatinine, Ser: 0.65 mg/dL (ref 0.44–1.00)
GFR calc Af Amer: >60 mL/min (ref >60)
GFR calc non Af Amer: >60 mL/min (ref >60)
Glucose, Bld: 93 mg/dL (ref 70–99)
Potassium: 4.1 mmol/L (ref 3.5–5.1)
Sodium: 131 mmol/L — ABNORMAL LOW (ref 135–145)
Total Bilirubin: 0.5 mg/dL (ref 0.3–1.2)
Total Protein: 6.8 g/dL (ref 6.5–8.1)

## 2020-02-01 LAB — BRAIN NATRIURETIC PEPTIDE: B Natriuretic Peptide: 80 pg/mL (ref 0.0–100.0)

## 2020-02-01 LAB — TROPONIN I (HIGH SENSITIVITY): Troponin I (High Sensitivity): 3 ng/L (ref <18)

## 2020-02-01 LAB — SARS CORONAVIRUS 2 BY RT PCR (HOSPITAL ORDER, PERFORMED IN ~~LOC~~ HOSPITAL LAB): SARS Coronavirus 2: NEGATIVE

## 2020-02-01 NOTE — Discharge Instructions (Addendum)
The CT of your head this evening was reassuring.  Chest x-ray did not show evidence of pneumonia and your blood work was also reassuring.  Your Covid test is negative.  Please keep your appointment with Dr. Juel Burrow office for Wednesday as scheduled.  Return to the emergency department if you develop any worsening symptoms.

## 2020-02-01 NOTE — ED Triage Notes (Signed)
Pt presents to ED with complaints of fall and SOB. Pt states she fell at home on Saturday and hit her head on the left side. Pt with bruising noted on left side of forehead. Pt takes ASA 324 mg daily. Pt states she also started with SOB and hoarseness since this am.

## 2020-02-01 NOTE — ED Provider Notes (Signed)
[facility] EMERGENCY DEPARTMENT Provider Note   CSN: [id number] Arrival date & time: 02/01/20  1818     History Chief Complaint  Patient presents with   Fall   Shortness of Breath    [name] is a 74 y.o. female.  HPI      [name] is a 74 y.o. female with past medical history of hypertension, hypothyroidism, and anemia who presents to the Emergency Department complaining of generalized weakness, fatigue, and shortness of breath and reported head injury that occurred 2 days ago.  She states she had been over to feed the dog and when she tried to stand back up she struck the left side of her head on a metal hook.  She has bruising to her left forehead.  No loss of consciousness.  Takes ASA daily.  She does complain of a mild left-sided headache.  Weakness and shortness of breath have been gradually worsening for several days.  No cough, fever or chills, nausea or vomiting, abdominal pain, or facial or extremity weakness.    Past Medical History:  Diagnosis Date   Anemia    Anxiety    Arthritis    Complication of anesthesia    "pt. holds her breath when waking up"   Depression    GERD (gastroesophageal reflux disease)    Hypercholesteremia    Hypertension    Hypothyroidism    Osteoporosis    PONV (postoperative nausea and vomiting)    Staph infection    abdomen and returned 5 yrs. later    Patient Active Problem List   Diagnosis Date Noted   COVID-19 virus infection 06/23/2019   Transient speech disturbance 06/22/2019   Hypothyroidism    Migraine equivalent 04/16/2017   Anxiety 04/16/2017   Palpitation 04/16/2017   Hyperlipidemia 04/16/2017   Essential hypertension 03/06/2017   Hypokalemia 03/06/2017   Hyponatremia 03/06/2017   TIA (transient ischemic attack) 03/05/2017   Primary localized osteoarthritis of left knee 08/22/2016   Paresthesia 08/10/2016   Weakness 08/10/2016   Depression 07/31/2016   S/P  thyroidectomy 07/31/2016   Primary osteoarthritis of left knee 07/31/2016   S/P cervical spinal fusion 07/31/2016   S/P total knee arthroplasty, right 07/31/2016   SUBLUXATION, RADIAL HEAD 10/27/2009   CONTUSION, ELBOW 10/27/2009    Past Surgical History:  Procedure Laterality Date   ABDOMINAL HYSTERECTOMY     CATARACT EXTRACTION W/PHACO Right 01/22/2019   Procedure: CATARACT EXTRACTION PHACO AND INTRAOCULAR LENS PLACEMENT ([place]);  Surgeon: [name], MD;  Location: [facility];  Service: Ophthalmology;  Laterality: Right;  right, CDE: 19.63   CATARACT EXTRACTION W/PHACO Left 02/16/2019   Procedure: CATARACT EXTRACTION PHACO AND INTRAOCULAR LENS PLACEMENT LEFT EYE (CDE: 14.36);  Surgeon: [name], MD;  Location: [facility];  Service: Ophthalmology;  Laterality: Left;   CERVICAL FUSION  1990's   COLON SURGERY     FOOT SURGERY Left    reshaped   HERNIA REPAIR     HIP ARTHROPLASTY Left    JOINT REPLACEMENT Right    hip   LUMBAR SPINE SURGERY     REPLACEMENT TOTAL KNEE Right    THYROIDECTOMY, PARTIAL     TOTAL KNEE ARTHROPLASTY Left 08/21/2016   Procedure: LEFT TOTAL KNEE ARTHROPLASTY;  Surgeon: Renette Butters, MD;  Location: [place];  Service: Orthopedics;  Laterality: Left;     OB History   No obstetric history on file.     No family history on file.  Social History   Tobacco  Use   Smoking status: Never Smoker   Smokeless tobacco: Never Used  Vaping Use   Vaping Use: Never used  Substance Use Topics   Alcohol use: No   Drug use: No    Home Medications Prior to Admission medications   Medication Sig Start Date End Date Taking? Authorizing Provider  amLODipine (NORVASC) 10 MG tablet Take 1 tablet (10 mg total) by mouth daily. 11/05/19 02/03/20  Arnoldo Lenis, MD  aspirin 325 MG tablet Take 1 tablet (325 mg total) by mouth daily with breakfast. 06/23/19   Roxan Hockey, MD  atorvastatin (LIPITOR) 80 MG tablet Take 1 tablet (80 mg total) by  mouth every evening. 06/23/19 06/22/20  Roxan Hockey, MD  Bacillus Coagulans-Inulin (PROBIOTIC FORMULA PO) Take 1 capsule by mouth every evening.    [provider]  denosumab (PROLIA) 60 MG/ML SOSY injection Inject 60 mg into the skin every 6 (six) months.    [name], MD  diclofenac (VOLTAREN) 75 MG EC tablet Take 75 mg by mouth 2 (two) times daily.    [provider]  docusate sodium (COLACE) 250 MG capsule Take 250 mg by mouth daily.    [provider]  DULoxetine (CYMBALTA) 60 MG capsule Take 60 mg by mouth every morning. 06/17/19   [provider]  levothyroxine (SYNTHROID) 50 MCG tablet Take 50 mcg by mouth daily before breakfast.  06/01/16   [provider]  losartan (COZAAR) 100 MG tablet Take 100 mg by mouth every evening.    [provider]  MAGNESIUM PO Take 3 tablets by mouth every evening.     [provider]  nebivolol (BYSTOLIC) 10 MG tablet Take 1 tablet (10 mg total) by mouth at bedtime. 06/23/19   Roxan Hockey, MD  omeprazole (PRILOSEC) 20 MG capsule Take 1 capsule (20 mg total) by mouth daily. While taking anti inflammatory medicine daily 08/21/16   Prudencio Burly III, PA-C  senna-docusate (SENOKOT-S) 8.6-50 MG tablet Take 2 tablets by mouth at bedtime as needed for mild constipation. 06/23/19   Roxan Hockey, MD    Allergies    Sulfamethoxazole  Review of Systems   Review of Systems  Constitutional: Negative for activity change, appetite change and fever.  HENT: Negative for facial swelling and trouble swallowing.        Bruising to left forehead  Eyes: Negative for photophobia, pain and visual disturbance.  Respiratory: Positive for shortness of breath. Negative for cough and chest tightness.   Cardiovascular: Negative for chest pain.  Gastrointestinal: Negative for abdominal pain, diarrhea, nausea and vomiting.  Genitourinary: Negative for difficulty urinating and dysuria.    Musculoskeletal: Negative for neck pain and neck stiffness.  Skin: Negative for rash and wound.  Neurological: Positive for weakness (Generalized weakness) and headaches. Negative for dizziness, facial asymmetry, speech difficulty and numbness.  Psychiatric/Behavioral: Negative for confusion and decreased concentration.    Physical Exam Updated Vital Signs BP 131/67 (BP Location: Right Arm)    Pulse 91    Temp 98.1 F (36.7 C) (Oral)    Resp 16    Ht 5' (1.524 m)    Wt 86.2 kg    SpO2 96%    BMI 37.11 kg/m   Physical Exam Vitals and nursing note reviewed.  Constitutional:      Appearance: Normal appearance. She is well-developed. She is not ill-appearing or toxic-appearing.  HENT:     Head:     Comments: Old appearing bruising to the upper left  forehead.  No abrasions, lacerations, or hematoma. Eyes:     Extraocular Movements: Extraocular movements intact.     Conjunctiva/sclera: Conjunctivae normal.     Pupils: Pupils are equal, round, and reactive to light.  Cardiovascular:     Rate and Rhythm: Normal rate and regular rhythm.     Pulses: Normal pulses.  Pulmonary:     Effort: Pulmonary effort is normal.     Breath sounds: Normal breath sounds.  Abdominal:     Palpations: Abdomen is soft.     Tenderness: There is no abdominal tenderness.  Musculoskeletal:        General: Normal range of motion.     Cervical back: Normal range of motion.     Right lower leg: No edema.     Left lower leg: No edema.  Skin:    General: Skin is warm.     Capillary Refill: Capillary refill takes less than 2 seconds.  Neurological:     General: No focal deficit present.     Mental Status: She is alert.     GCS: GCS eye subscore is 4. GCS verbal subscore is 5. GCS motor subscore is 6.     Sensory: Sensation is intact. No sensory deficit.     Motor: Motor function is intact. No weakness.     Coordination: Coordination is intact.     Comments: CN II-XII intact.  Speech clear.  No pronator  drift, nml finger nose testing     ED Results / Procedures / Treatments   Labs (all labs ordered are listed, but only abnormal results are displayed) Labs Reviewed  COMPREHENSIVE METABOLIC PANEL - Abnormal; Notable for the following components:      Result Value   Sodium 131 (*)    CO2 19 (*)    All other components within normal limits  URINALYSIS, ROUTINE W REFLEX MICROSCOPIC - Abnormal; Notable for the following components:   Color, Urine COLORLESS (*)    Specific Gravity, Urine 1.002 (*)    All other components within normal limits  SARS CORONAVIRUS 2 BY RT PCR (HOSPITAL ORDER, [place] [facility])  CBC WITH DIFFERENTIAL/PLATELET  BRAIN NATRIURETIC PEPTIDE  TROPONIN I (HIGH SENSITIVITY)    EKG EKG Interpretation  Date/Time:  Monday February 01 2020 18:47:34 EDT Ventricular Rate:  80 PR Interval:  188 QRS Duration: 136 QT Interval:  400 QTC Calculation: 461 R Axis:   -58 Text Interpretation: Normal sinus rhythm Right bundle branch block Left anterior fascicular block Bifascicular block  Left ventricular hypertrophy with repolarization abnormality ( R in aVL , Romhilt-Estes ) Cannot rule out Septal infarct , age undetermined Abnormal ECG Confirmed by Milton Ferguson [phone]) on 02/01/2020 9:38:29 PM   Radiology CT Head Wo Contrast  Result Date: 02/01/2020 CLINICAL DATA:  Fall and shortness of breath EXAM: CT HEAD WITHOUT CONTRAST TECHNIQUE: Contiguous axial images were obtained from the base of the skull through the vertex without intravenous contrast. COMPARISON:  MRI June 23, 2019 FINDINGS: Brain: No evidence of acute territorial infarction, hemorrhage, hydrocephalus,extra-axial collection or mass lesion/mass effect. There is dilatation the ventricles and sulci consistent with age-related atrophy. Mild low-attenuation changes in the deep white matter consistent with small vessel ischemia. Vascular: No hyperdense vessel or unexpected calcification. Skull: The  skull is intact. No fracture or focal lesion identified. Sinuses/Orbits: The visualized paranasal sinuses and mastoid air cells are clear. The orbits and globes intact. Other: None IMPRESSION: No acute intracranial abnormality. Findings consistent with age related  atrophy and chronic small vessel ischemia Electronically Signed   By: Prudencio Pair M.D.   On: 02/01/2020 20:20   DG Chest Portable 1 View  Result Date: 02/01/2020 CLINICAL DATA:  Shortness of breath. EXAM: PORTABLE CHEST 1 VIEW COMPARISON:  December 07, 2014 FINDINGS: There is no evidence of acute infiltrate, pleural effusion or pneumothorax. The heart size and mediastinal contours are within normal limits. There is tortuosity of the descending thoracic aorta. Degenerative changes seen throughout the thoracic spine. IMPRESSION: No active disease. Electronically Signed   By: Virgina Norfolk M.D.   On: 02/01/2020 20:26    Procedures Procedures (including critical care time)  Medications Ordered in ED Medications - No data to display  ED Course  I have reviewed the triage vital signs and the nursing notes.  Pertinent labs & imaging results that were available during my care of the patient were reviewed by me and considered in my medical decision making (see chart for details).    MDM Rules/Calculators/A&P                          Patient here with generalized weakness and exertional dyspnea for several days.  She also reports a head injury 2 days ago.  No loss of consciousness but she does take aspirin daily and reports mild headache.  Will order CT imaging of the head and cardiac work-up.  Labs evaluated by me, mildly hyponatremic but remaining electrolytes unremarkable.  Remaining labs, imaging  also unremarkable. covid negative.  No tachycardia or hypoxia here.  Doubt PE, ACS or emergent neurological process.  Pt appears appropriate for d/c home agrees to close PCP f/u this week.  Strict return precautions discussed.    Final  Clinical Impression(s) / ED Diagnoses Final diagnoses:  Acute head injury without loss of consciousness, initial encounter  Shortness of breath  Generalized weakness    Rx / DC Orders ED Discharge Orders    None       Kem Parkinson, PA-C 02/05/20 0013    Milton Ferguson, MD 02/09/20 [phone]

## 2020-02-03 DIAGNOSIS — I10 Essential (primary) hypertension: Secondary | ICD-10-CM | POA: Diagnosis not present

## 2020-02-03 DIAGNOSIS — I959 Hypotension, unspecified: Secondary | ICD-10-CM | POA: Diagnosis not present

## 2020-02-03 DIAGNOSIS — K219 Gastro-esophageal reflux disease without esophagitis: Secondary | ICD-10-CM | POA: Diagnosis not present

## 2020-02-03 DIAGNOSIS — E039 Hypothyroidism, unspecified: Secondary | ICD-10-CM | POA: Diagnosis not present

## 2020-02-03 DIAGNOSIS — F064 Anxiety disorder due to known physiological condition: Secondary | ICD-10-CM | POA: Diagnosis not present

## 2020-02-03 DIAGNOSIS — E785 Hyperlipidemia, unspecified: Secondary | ICD-10-CM | POA: Diagnosis not present

## 2020-03-04 ENCOUNTER — Other Ambulatory Visit (HOSPITAL_BASED_OUTPATIENT_CLINIC_OR_DEPARTMENT_OTHER): Payer: Self-pay

## 2020-03-04 DIAGNOSIS — G4733 Obstructive sleep apnea (adult) (pediatric): Secondary | ICD-10-CM

## 2020-03-07 ENCOUNTER — Ambulatory Visit: Payer: PPO | Attending: Neurology | Admitting: Neurology

## 2020-03-07 ENCOUNTER — Other Ambulatory Visit: Payer: Self-pay

## 2020-03-07 DIAGNOSIS — Z79899 Other long term (current) drug therapy: Secondary | ICD-10-CM | POA: Diagnosis not present

## 2020-03-07 DIAGNOSIS — Z7982 Long term (current) use of aspirin: Secondary | ICD-10-CM | POA: Insufficient documentation

## 2020-03-07 DIAGNOSIS — G4733 Obstructive sleep apnea (adult) (pediatric): Secondary | ICD-10-CM | POA: Insufficient documentation

## 2020-03-07 DIAGNOSIS — Z7901 Long term (current) use of anticoagulants: Secondary | ICD-10-CM | POA: Insufficient documentation

## 2020-03-08 DIAGNOSIS — E039 Hypothyroidism, unspecified: Secondary | ICD-10-CM | POA: Diagnosis not present

## 2020-03-08 DIAGNOSIS — E785 Hyperlipidemia, unspecified: Secondary | ICD-10-CM | POA: Diagnosis not present

## 2020-03-08 DIAGNOSIS — K219 Gastro-esophageal reflux disease without esophagitis: Secondary | ICD-10-CM | POA: Diagnosis not present

## 2020-03-08 DIAGNOSIS — I10 Essential (primary) hypertension: Secondary | ICD-10-CM | POA: Diagnosis not present

## 2020-03-13 NOTE — Procedures (Signed)
Chloride A. [name], MD     [email or web address]             NOCTURNAL POLYSOMNOGRAPHY   LOCATION: [facility]   Patient Name: [name], [name] Date: 03/07/2020 Gender: Female D.O.B: April 28, 1946 Age (years): 9 Referring Provider: Phillips Odor MD, ABSM Height (inches): 60 Interpreting Physician: Phillips Odor MD, ABSM Weight (lbs): 194 RPSGT: Peak, Robert BMI: 38 MRN: [id number] Neck Size: 14.00 CLINICAL INFORMATION Sleep Study Type: NPSG     Indication for sleep study: N/A     Epworth Sleepiness Score: 4     SLEEP STUDY TECHNIQUE As per the AASM Manual for the Scoring of Sleep and Associated Events v2.3 (April 2016) with a hypopnea requiring 4% desaturations.  The channels recorded and monitored were frontal, central and occipital EEG, electrooculogram (EOG), submentalis EMG (chin), nasal and oral airflow, thoracic and abdominal wall motion, anterior tibialis EMG, snore microphone, electrocardiogram, and pulse oximetry.  MEDICATIONS Medications self-administered by patient taken the night of the study : N/A  Current Outpatient Medications:  .  amLODipine (NORVASC) 10 MG tablet, Take 1 tablet (10 mg total) by mouth daily., Disp: 30 tablet, Rfl: 6 .  aspirin 325 MG tablet, Take 1 tablet (325 mg total) by mouth daily with breakfast., Disp: 30 tablet, Rfl: 3 .  atorvastatin (LIPITOR) 80 MG tablet, Take 1 tablet (80 mg total) by mouth every evening., Disp: 30 tablet, Rfl: 11 .  Bacillus Coagulans-Inulin (PROBIOTIC FORMULA PO), Take 1 capsule by mouth every evening., Disp: , Rfl:  .  denosumab (PROLIA) 60 MG/ML SOSY injection, Inject 60 mg into the skin every 6 (six) months., Disp: , Rfl:  .  diclofenac (VOLTAREN) 75 MG EC tablet, Take 75 mg by mouth 2 (two) times daily., Disp: , Rfl:  .  docusate sodium (COLACE) 250 MG capsule, Take 250 mg by mouth daily., Disp: , Rfl:  .  DULoxetine (CYMBALTA) 60 MG capsule, Take 60 mg by mouth every morning.,  Disp: , Rfl:  .  levothyroxine (SYNTHROID) 50 MCG tablet, Take 50 mcg by mouth daily before breakfast. , Disp: , Rfl:  .  losartan (COZAAR) 100 MG tablet, Take 100 mg by mouth every evening., Disp: , Rfl:  .  MAGNESIUM PO, Take 3 tablets by mouth every evening. , Disp: , Rfl:  .  nebivolol (BYSTOLIC) 10 MG tablet, Take 1 tablet (10 mg total) by mouth at bedtime., Disp: 30 tablet, Rfl: 3 .  omeprazole (PRILOSEC) 20 MG capsule, Take 1 capsule (20 mg total) by mouth daily. While taking anti inflammatory medicine daily, Disp: 30 capsule, Rfl: 2 .  senna-docusate (SENOKOT-S) 8.6-50 MG tablet, Take 2 tablets by mouth at bedtime as needed for mild constipation., Disp: 60 tablet, Rfl: 0     SLEEP ARCHITECTURE The study was initiated at 9:42:14 PM and ended at 5:06:16 AM.  Sleep onset time was 57.3 minutes and the sleep efficiency was 72.1%%. The total sleep time was 320.2 minutes.  Stage REM latency was N/A minutes.  The patient spent 6.6%% of the night in stage N1 sleep, 90.6%% in stage N2 sleep, 2.8%% in stage N3 and 0% in REM.  Alpha intrusion was absent.  Supine sleep was 37.88%.  RESPIRATORY PARAMETERS The overall apnea/hypopnea index (AHI) was 16.7 per hour. There were 40 total apneas, including 40 obstructive, 0 central and 0 mixed apneas. There were 49 hypopneas and 0 RERAs.  The AHI during Stage REM sleep was N/A per hour.  AHI while supine was 43.5 per  hour.  The mean oxygen saturation was 94.6%. The minimum SpO2 during sleep was 84.0%.  soft snoring was noted during this study.  CARDIAC DATA The 2 lead EKG demonstrated sinus rhythm. The mean heart rate was 61.8 beats per minute. Other EKG findings include: None.  LEG MOVEMENT DATA The total PLMS were 0 with a resulting PLMS index of 0.0. Associated arousal with leg movement index was 0.0.  IMPRESSIONS Moderate obstructive sleep apnea is noted. AutoPAP 8-14 is recommended.    Delano Metz, MD Diplomate, American  Board of Sleep Medicine.  ELECTRONICALLY SIGNED ON:  03/13/2020, 9:56 PM [place] PH: [phone]   FX: [phone] [place]

## 2020-03-22 DIAGNOSIS — E875 Hyperkalemia: Secondary | ICD-10-CM | POA: Diagnosis not present

## 2020-03-22 DIAGNOSIS — H9319 Tinnitus, unspecified ear: Secondary | ICD-10-CM | POA: Diagnosis not present

## 2020-03-22 DIAGNOSIS — M19049 Primary osteoarthritis, unspecified hand: Secondary | ICD-10-CM | POA: Diagnosis not present

## 2020-03-22 DIAGNOSIS — E871 Hypo-osmolality and hyponatremia: Secondary | ICD-10-CM | POA: Diagnosis not present

## 2020-03-22 DIAGNOSIS — I6529 Occlusion and stenosis of unspecified carotid artery: Secondary | ICD-10-CM | POA: Diagnosis not present

## 2020-03-22 DIAGNOSIS — Z0001 Encounter for general adult medical examination with abnormal findings: Secondary | ICD-10-CM | POA: Diagnosis not present

## 2020-03-22 DIAGNOSIS — M1990 Unspecified osteoarthritis, unspecified site: Secondary | ICD-10-CM | POA: Diagnosis not present

## 2020-03-22 DIAGNOSIS — Z8673 Personal history of transient ischemic attack (TIA), and cerebral infarction without residual deficits: Secondary | ICD-10-CM | POA: Diagnosis not present

## 2020-03-22 DIAGNOSIS — R4701 Aphasia: Secondary | ICD-10-CM | POA: Diagnosis not present

## 2020-03-22 DIAGNOSIS — E039 Hypothyroidism, unspecified: Secondary | ICD-10-CM | POA: Diagnosis not present

## 2020-03-22 DIAGNOSIS — M79669 Pain in unspecified lower leg: Secondary | ICD-10-CM | POA: Diagnosis not present

## 2020-03-22 DIAGNOSIS — E782 Mixed hyperlipidemia: Secondary | ICD-10-CM | POA: Diagnosis not present

## 2020-03-28 DIAGNOSIS — E039 Hypothyroidism, unspecified: Secondary | ICD-10-CM | POA: Diagnosis not present

## 2020-03-28 DIAGNOSIS — M1990 Unspecified osteoarthritis, unspecified site: Secondary | ICD-10-CM | POA: Diagnosis not present

## 2020-03-28 DIAGNOSIS — F331 Major depressive disorder, recurrent, moderate: Secondary | ICD-10-CM | POA: Diagnosis not present

## 2020-03-28 DIAGNOSIS — E782 Mixed hyperlipidemia: Secondary | ICD-10-CM | POA: Diagnosis not present

## 2020-03-28 DIAGNOSIS — K219 Gastro-esophageal reflux disease without esophagitis: Secondary | ICD-10-CM | POA: Diagnosis not present

## 2020-03-28 DIAGNOSIS — E559 Vitamin D deficiency, unspecified: Secondary | ICD-10-CM | POA: Diagnosis not present

## 2020-03-28 DIAGNOSIS — E875 Hyperkalemia: Secondary | ICD-10-CM | POA: Diagnosis not present

## 2020-03-28 DIAGNOSIS — F411 Generalized anxiety disorder: Secondary | ICD-10-CM | POA: Diagnosis not present

## 2020-03-28 DIAGNOSIS — E871 Hypo-osmolality and hyponatremia: Secondary | ICD-10-CM | POA: Diagnosis not present

## 2020-03-28 DIAGNOSIS — Z8673 Personal history of transient ischemic attack (TIA), and cerebral infarction without residual deficits: Secondary | ICD-10-CM | POA: Diagnosis not present

## 2020-03-28 DIAGNOSIS — I10 Essential (primary) hypertension: Secondary | ICD-10-CM | POA: Diagnosis not present

## 2020-03-28 DIAGNOSIS — Z23 Encounter for immunization: Secondary | ICD-10-CM | POA: Diagnosis not present

## 2020-04-11 DIAGNOSIS — K219 Gastro-esophageal reflux disease without esophagitis: Secondary | ICD-10-CM | POA: Diagnosis not present

## 2020-04-11 DIAGNOSIS — E785 Hyperlipidemia, unspecified: Secondary | ICD-10-CM | POA: Diagnosis not present

## 2020-04-11 DIAGNOSIS — E039 Hypothyroidism, unspecified: Secondary | ICD-10-CM | POA: Diagnosis not present

## 2020-04-11 DIAGNOSIS — I10 Essential (primary) hypertension: Secondary | ICD-10-CM | POA: Diagnosis not present

## 2020-04-25 ENCOUNTER — Encounter (HOSPITAL_COMMUNITY)
Admission: RE | Admit: 2020-04-25 | Discharge: 2020-04-25 | Disposition: A | Discharge status: Home / Self Care | Payer: PPO | Source: Ambulatory Visit | Attending: Internal Medicine | Admitting: Internal Medicine

## 2020-04-25 ENCOUNTER — Other Ambulatory Visit: Payer: Self-pay

## 2020-04-25 DIAGNOSIS — M810 Age-related osteoporosis without current pathological fracture: Secondary | ICD-10-CM | POA: Insufficient documentation

## 2020-04-25 MED ORDER — DENOSUMAB 60 MG/ML ~~LOC~~ SOSY
60.0000 mg | PREFILLED_SYRINGE | Freq: Once | SUBCUTANEOUS | Status: AC
  Administered 2020-04-25: 60 mg via SUBCUTANEOUS

## 2020-05-10 ENCOUNTER — Ambulatory Visit: Payer: PPO | Admitting: Cardiology

## 2020-05-10 ENCOUNTER — Encounter: Payer: Self-pay | Admitting: Cardiology

## 2020-05-10 ENCOUNTER — Encounter: Payer: Self-pay | Admitting: *Deleted

## 2020-05-10 VITALS — BP 138/72 | HR 71 | Ht 60.0 in | Wt 194.0 lb

## 2020-05-10 DIAGNOSIS — I10 Essential (primary) hypertension: Secondary | ICD-10-CM

## 2020-05-10 DIAGNOSIS — R002 Palpitations: Secondary | ICD-10-CM

## 2020-05-10 DIAGNOSIS — E782 Mixed hyperlipidemia: Secondary | ICD-10-CM

## 2020-05-10 NOTE — Patient Instructions (Signed)
Your physician recommends that you schedule a follow-up appointment in: 2 [place]  Your physician recommends that you continue on your current medications as directed. Please refer to the Current Medication list given to you today.  ZIO PATCH FOR 7 DAYS   Thank you for choosing [facility]!!

## 2020-05-10 NOTE — Progress Notes (Signed)
Clinical Summary Ms. Arenson is a 74 y.o.female seen today for follow up of the following medical problems.  1.HTN - off HCTZ due to low Na during Jan 2021 admission for TIA. Norvasc 10mg  was also stopped at that time to allower permissive HTN, eventually restarted at lower dose of 5mg  daily - home bp's 120s-150s/70s-90s  - pcp lowered norvasc to 2.5mg  daily - home bp's 110-130s/50-s60s   2. TIA - admission Jan 2021 - on high dose ASA, statin  3. Hyperlipidemia - 09/2019 TC 139 TG 54 HDL 72 LDL 55 - she remains compliant with statin   4. OSA - awaiting cpap  5. Palpitations - can have some palpitations, 3-4 times a week. Lasts up to 5-15 minutes  -denies heavy caffine or EtOH  Has not been in favor of covid vaccines.  Past Medical History:  Diagnosis Date  . Anemia   . Anxiety   . Arthritis   . Complication of anesthesia    "pt. holds her breath when waking up"  . Depression   . GERD (gastroesophageal reflux disease)   . Hypercholesteremia   . Hypertension   . Hypothyroidism   . Osteoporosis   . PONV (postoperative nausea and vomiting)   . Staph infection    abdomen and returned 5 yrs. later     Allergies  Allergen Reactions  . Sulfamethoxazole Swelling    Lips swell and feel numb     Current Outpatient Medications  Medication Sig Dispense Refill  . ALPRAZolam (XANAX) 0.25 MG tablet Take 0.25 mg by mouth at bedtime as needed for anxiety.    Marland Kitchen amLODipine (NORVASC) 2.5 MG tablet Take 2.5 mg by mouth daily.    Marland Kitchen aspirin 325 MG tablet Take 1 tablet (325 mg total) by mouth daily with breakfast. 30 tablet 3  . atorvastatin (LIPITOR) 80 MG tablet Take 1 tablet (80 mg total) by mouth every evening. 30 tablet 11  . Bacillus Coagulans-Inulin (PROBIOTIC FORMULA PO) Take 1 capsule by mouth every evening.    . denosumab (PROLIA) 60 MG/ML SOSY injection Inject 60 mg into the skin every 6 (six) months.    . DULoxetine (CYMBALTA) 60 MG capsule Take 60 mg  by mouth every morning.    Marland Kitchen levothyroxine (SYNTHROID) 50 MCG tablet Take 50 mcg by mouth daily before breakfast.     . losartan (COZAAR) 100 MG tablet Take 100 mg by mouth every evening.    Marland Kitchen MAGNESIUM PO Take 3 tablets by mouth every evening.     . nebivolol (BYSTOLIC) 10 MG tablet Take 1 tablet (10 mg total) by mouth at bedtime. 30 tablet 3  . omeprazole (PRILOSEC) 20 MG capsule Take 1 capsule (20 mg total) by mouth daily. While taking anti inflammatory medicine daily 30 capsule 2  . senna-docusate (SENOKOT-S) 8.6-50 MG tablet Take 2 tablets by mouth at bedtime as needed for mild constipation. 60 tablet 0   No current facility-administered medications for this visit.     Past Surgical History:  Procedure Laterality Date  . ABDOMINAL HYSTERECTOMY    . CATARACT EXTRACTION W/PHACO Right 01/22/2019   Procedure: CATARACT EXTRACTION PHACO AND INTRAOCULAR LENS PLACEMENT ([facility]);  Surgeon: [name], MD;  Location: [facility];  Service: Ophthalmology;  Laterality: Right;  right, CDE: 19.63  . CATARACT EXTRACTION W/PHACO Left 02/16/2019   Procedure: CATARACT EXTRACTION PHACO AND INTRAOCULAR LENS PLACEMENT LEFT EYE (CDE: 14.36);  Surgeon: [name], MD;  Location: [facility];  Service: Ophthalmology;  Laterality: Left;  .  CERVICAL FUSION  1990's  . COLON SURGERY    . FOOT SURGERY Left    reshaped  . HERNIA REPAIR    . HIP ARTHROPLASTY Left   . JOINT REPLACEMENT Right    hip  . LUMBAR SPINE SURGERY    . REPLACEMENT TOTAL KNEE Right   . THYROIDECTOMY, PARTIAL    . TOTAL KNEE ARTHROPLASTY Left 08/21/2016   Procedure: LEFT TOTAL KNEE ARTHROPLASTY;  Surgeon: Renette Butters, MD;  Location: [facility];  Service: Orthopedics;  Laterality: Left;     Allergies  Allergen Reactions  . Sulfamethoxazole Swelling    Lips swell and feel numb      No family history on file.   Social History Ms. Coppedge reports that she has never smoked. She has never used smokeless tobacco. Ms. Markov reports no  history of alcohol use.   Review of Systems CONSTITUTIONAL: No weight loss, fever, chills, weakness or fatigue.  HEENT: Eyes: No visual loss, blurred vision, double vision or yellow sclerae.No hearing loss, sneezing, congestion, runny nose or sore throat.  SKIN: No rash or itching.  CARDIOVASCULAR: per hpi RESPIRATORY: No shortness of breath, cough or sputum.  GASTROINTESTINAL: No anorexia, nausea, vomiting or diarrhea. No abdominal pain or blood.  GENITOURINARY: No burning on urination, no polyuria NEUROLOGICAL: No headache, dizziness, syncope, paralysis, ataxia, numbness or tingling in the extremities. No change in bowel or bladder control.  MUSCULOSKELETAL: No muscle, back pain, joint pain or stiffness.  LYMPHATICS: No enlarged nodes. No history of splenectomy.  PSYCHIATRIC: No history of depression or anxiety.  ENDOCRINOLOGIC: No reports of sweating, cold or heat intolerance. No polyuria or polydipsia.  Marland Kitchen   Physical Examination Vitals:   05/10/20 1134  BP: 138/72  Pulse: 71  SpO2: 96%   Filed Weights   05/10/20 1134  Weight: 194 lb (88 kg)    Gen: resting comfortably, no acute distress HEENT: no scleral icterus, pupils equal round and reactive, no palptable cervical adenopathy,  CV: RRR, no m/r/g, no jvd Resp: Clear to auscultation bilaterally GI: abdomen is soft, non-tender, non-distended, normal bowel sounds, no hepatosplenomegaly MSK: extremities are warm, no edema.  Skin: warm, no rash Neuro:  no focal deficits Psych: appropriate affect   Diagnostic Studies  03/2017 echo Study Conclusions  - Left ventricle: The cavity size was normal. Systolic function was normal. The estimated ejection fraction was in the range of 55% to 60%. Wall motion was normal; there were no regional wall motion abnormalities. Doppler parameters are consistent with abnormal left ventricular relaxation (grade 1 diastolic dysfunction). - Mitral valve: There was moderate  regurgitation. - Atrial septum: No defect or patent foramen ovale was identified. - Tricuspid valve: There was mild regurgitation. - Pulmonic valve: There was mild regurgitation. - Pulmonary arteries: PA peak pressure: 41 mm Hg (S). - Systemic veins: The IVC was suboptimally visualized, but appeared dilated. There was normal respiratory variation. Estimated RAP 8 mmHg.   Assessment and Plan  1. HTN -at goal, continue current meds  2. Hyperlipidemia -continue statin, LDL has been at goal  3. Palpitations - will obtain a 7 day zio patch     Arnoldo Lenis, M.D.

## 2020-05-12 ENCOUNTER — Ambulatory Visit (INDEPENDENT_AMBULATORY_CARE_PROVIDER_SITE_OTHER): Payer: PPO

## 2020-05-12 DIAGNOSIS — R002 Palpitations: Secondary | ICD-10-CM

## 2020-05-20 DIAGNOSIS — G4733 Obstructive sleep apnea (adult) (pediatric): Secondary | ICD-10-CM | POA: Diagnosis not present

## 2020-05-26 DIAGNOSIS — R002 Palpitations: Secondary | ICD-10-CM | POA: Diagnosis not present

## 2020-06-03 DIAGNOSIS — E039 Hypothyroidism, unspecified: Secondary | ICD-10-CM | POA: Diagnosis not present

## 2020-06-03 DIAGNOSIS — K219 Gastro-esophageal reflux disease without esophagitis: Secondary | ICD-10-CM | POA: Diagnosis not present

## 2020-06-03 DIAGNOSIS — E785 Hyperlipidemia, unspecified: Secondary | ICD-10-CM | POA: Diagnosis not present

## 2020-06-03 DIAGNOSIS — I10 Essential (primary) hypertension: Secondary | ICD-10-CM | POA: Diagnosis not present

## 2020-06-09 NOTE — Addendum Note (Signed)
Addended by: Eustace Moore on: 06/09/2020 02:43 PM   Modules accepted: Orders

## 2020-06-13 ENCOUNTER — Telehealth: Payer: Self-pay | Admitting: Cardiology

## 2020-06-13 NOTE — Telephone Encounter (Signed)
Patient called requesting results of recent heart monitor. 

## 2020-06-15 NOTE — Telephone Encounter (Signed)
Just resulted to Mackey Birchwood MD

## 2020-06-15 NOTE — Telephone Encounter (Signed)
Pt voiced understanding and appreciative - says palpitations are not frequent or bothersome - will update [place] if this changes - upcoming appt in February

## 2020-06-15 NOTE — Telephone Encounter (Signed)
Heart monitor overall looks good, just some occaional extra heart beats that can give the feeling of skipping but is not anything dangerous or concerning. If symptoms are very bothersome we could increase her bystolic to 10mg  bid, if symptoms tolerable then don't need to make any changes.

## 2020-06-16 DIAGNOSIS — G4733 Obstructive sleep apnea (adult) (pediatric): Secondary | ICD-10-CM | POA: Diagnosis not present

## 2020-06-16 DIAGNOSIS — R21 Rash and other nonspecific skin eruption: Secondary | ICD-10-CM | POA: Diagnosis not present

## 2020-06-17 DIAGNOSIS — H04123 Dry eye syndrome of bilateral lacrimal glands: Secondary | ICD-10-CM | POA: Diagnosis not present

## 2020-06-20 DIAGNOSIS — G4733 Obstructive sleep apnea (adult) (pediatric): Secondary | ICD-10-CM | POA: Diagnosis not present

## 2020-06-23 DIAGNOSIS — G4733 Obstructive sleep apnea (adult) (pediatric): Secondary | ICD-10-CM | POA: Diagnosis not present

## 2020-07-02 DIAGNOSIS — K219 Gastro-esophageal reflux disease without esophagitis: Secondary | ICD-10-CM | POA: Diagnosis not present

## 2020-07-02 DIAGNOSIS — E039 Hypothyroidism, unspecified: Secondary | ICD-10-CM | POA: Diagnosis not present

## 2020-07-02 DIAGNOSIS — I10 Essential (primary) hypertension: Secondary | ICD-10-CM | POA: Diagnosis not present

## 2020-07-02 DIAGNOSIS — E785 Hyperlipidemia, unspecified: Secondary | ICD-10-CM | POA: Diagnosis not present

## 2020-07-21 DIAGNOSIS — G4733 Obstructive sleep apnea (adult) (pediatric): Secondary | ICD-10-CM | POA: Diagnosis not present

## 2020-07-25 DIAGNOSIS — G4733 Obstructive sleep apnea (adult) (pediatric): Secondary | ICD-10-CM | POA: Diagnosis not present

## 2020-07-27 ENCOUNTER — Ambulatory Visit: Payer: PPO | Admitting: Cardiology

## 2020-07-27 ENCOUNTER — Encounter: Payer: Self-pay | Admitting: Cardiology

## 2020-07-27 VITALS — BP 130/74 | HR 72 | Ht 60.0 in | Wt 206.0 lb

## 2020-07-27 DIAGNOSIS — E782 Mixed hyperlipidemia: Secondary | ICD-10-CM

## 2020-07-27 DIAGNOSIS — I10 Essential (primary) hypertension: Secondary | ICD-10-CM | POA: Diagnosis not present

## 2020-07-27 DIAGNOSIS — R002 Palpitations: Secondary | ICD-10-CM | POA: Diagnosis not present

## 2020-07-27 MED ORDER — PANTOPRAZOLE SODIUM 40 MG PO TBEC: 40.0000 mg | DELAYED_RELEASE_TABLET | Freq: Every day | ORAL | 1 refills | Status: DC

## 2020-07-27 NOTE — Progress Notes (Signed)
Clinical Summary Ms. Kops is a 75 y.o.female  seen today for follow up of the following medical problems.  1.HTN - off HCTZ due to low Na during Jan 2021 admissionfor TIA. Norvasc 10mg  was also stopped at that time to allower permissive HTN, eventually restarted at lower dose of 5mg  daily  - pcp lowered norvasc to 2.5mg  daily   - home bp's 115-130/55-80  2. TIA - admission Jan 2021 - on high dose ASA, statin  3. Hyperlipidemia - 09/2019 TC 139 TG 54 HDL 72 LDL 55 - 03/2020 TC 136 TG 49 HDL 74 LDL 51 - compliant with statin   4. OSA - she is on cpap  5. Palpitations - can have some palpitations, 3-4 times a week. Lasts up to 5-15 minutes  -denies heavy caffine or EtOH  Jan 2022 monitor frequent PACS, rare PVCs - no recent significant symptoms    Has not been in favor of covid vaccines.   Past Medical History:  Diagnosis Date  . Anemia   . Anxiety   . Arthritis   . Complication of anesthesia    "pt. holds her breath when waking up"  . Depression   . GERD (gastroesophageal reflux disease)   . Hypercholesteremia   . Hypertension   . Hypothyroidism   . Osteoporosis   . PONV (postoperative nausea and vomiting)   . Staph infection    abdomen and returned 5 yrs. later     Allergies  Allergen Reactions  . Sulfamethoxazole Swelling    Lips swell and feel numb     Current Outpatient Medications  Medication Sig Dispense Refill  . ALPRAZolam (XANAX) 0.25 MG tablet Take 0.25 mg by mouth at bedtime as needed for anxiety.    Marland Kitchen amLODipine (NORVASC) 2.5 MG tablet Take 2.5 mg by mouth daily.    Marland Kitchen aspirin 325 MG tablet Take 1 tablet (325 mg total) by mouth daily with breakfast. 30 tablet 3  . atorvastatin (LIPITOR) 80 MG tablet Take 1 tablet (80 mg total) by mouth every evening. 30 tablet 11  . Bacillus Coagulans-Inulin (PROBIOTIC FORMULA PO) Take 1 capsule by mouth every evening.    . denosumab (PROLIA) 60 MG/ML SOSY injection Inject 60 mg into  the skin every 6 (six) months.    . DULoxetine (CYMBALTA) 60 MG capsule Take 60 mg by mouth every morning.    Marland Kitchen levothyroxine (SYNTHROID) 50 MCG tablet Take 50 mcg by mouth daily before breakfast.     . losartan (COZAAR) 100 MG tablet Take 100 mg by mouth every evening.    Marland Kitchen MAGNESIUM PO Take 3 tablets by mouth every evening.     . nebivolol (BYSTOLIC) 10 MG tablet Take 1 tablet (10 mg total) by mouth at bedtime. 30 tablet 3  . omeprazole (PRILOSEC) 20 MG capsule Take 1 capsule (20 mg total) by mouth daily. While taking anti inflammatory medicine daily 30 capsule 2  . senna-docusate (SENOKOT-S) 8.6-50 MG tablet Take 2 tablets by mouth at bedtime as needed for mild constipation. 60 tablet 0   No current facility-administered medications for this visit.     Past Surgical History:  Procedure Laterality Date  . ABDOMINAL HYSTERECTOMY    . CATARACT EXTRACTION W/PHACO Right 01/22/2019   Procedure: CATARACT EXTRACTION PHACO AND INTRAOCULAR LENS PLACEMENT (IOC);  Surgeon: [name], MD;  Location: [facility];  Service: Ophthalmology;  Laterality: Right;  right, CDE: 19.63  . CATARACT EXTRACTION W/PHACO Left 02/16/2019   Procedure: CATARACT EXTRACTION PHACO  AND INTRAOCULAR LENS PLACEMENT LEFT EYE (CDE: 14.36);  Surgeon: [name], MD;  Location: [facility];  Service: Ophthalmology;  Laterality: Left;  . CERVICAL FUSION  1990's  . COLON SURGERY    . FOOT SURGERY Left    reshaped  . HERNIA REPAIR    . HIP ARTHROPLASTY Left   . JOINT REPLACEMENT Right    hip  . LUMBAR SPINE SURGERY    . REPLACEMENT TOTAL KNEE Right   . THYROIDECTOMY, PARTIAL    . TOTAL KNEE ARTHROPLASTY Left 08/21/2016   Procedure: LEFT TOTAL KNEE ARTHROPLASTY;  Surgeon: Renette Butters, MD;  Location: [facility];  Service: Orthopedics;  Laterality: Left;     Allergies  Allergen Reactions  . Sulfamethoxazole Swelling    Lips swell and feel numb      No family history on file.   Social History Ms. Nordling reports that  she has never smoked. She has never used smokeless tobacco. Ms. Kras reports no history of alcohol use.   Review of Systems CONSTITUTIONAL: No weight loss, fever, chills, weakness or fatigue.  HEENT: Eyes: No visual loss, blurred vision, double vision or yellow sclerae.No hearing loss, sneezing, congestion, runny nose or sore throat.  SKIN: No rash or itching.  CARDIOVASCULAR: per hpi RESPIRATORY: No shortness of breath, cough or sputum.  GASTROINTESTINAL: No anorexia, nausea, vomiting or diarrhea. No abdominal pain or blood.  GENITOURINARY: No burning on urination, no polyuria NEUROLOGICAL: No headache, dizziness, syncope, paralysis, ataxia, numbness or tingling in the extremities. No change in bowel or bladder control.  MUSCULOSKELETAL: No muscle, back pain, joint pain or stiffness.  LYMPHATICS: No enlarged nodes. No history of splenectomy.  PSYCHIATRIC: No history of depression or anxiety.  ENDOCRINOLOGIC: No reports of sweating, cold or heat intolerance. No polyuria or polydipsia.  Marland Kitchen   Physical Examination Today's Vitals   07/27/20 1454  BP: 130/74  Pulse: 72  SpO2: 98%  Weight: 206 lb (93.4 kg)  Height: 5' (1.524 m)   Body mass index is 40.23 kg/m.  Gen: resting comfortably, no acute distress HEENT: no scleral icterus, pupils equal round and reactive, no palptable cervical adenopathy,  CV; RRR, no m/rg, no jvd Resp: Clear to auscultation bilaterally GI: abdomen is soft, non-tender, non-distended, normal bowel sounds, no hepatosplenomegaly MSK: extremities are warm, no edema.  Skin: warm, no rash Neuro:  no focal deficits Psych: appropriate affect   Diagnostic Studies  03/2017 echo Study Conclusions  - Left ventricle: The cavity size was normal. Systolic function was normal. The estimated ejection fraction was in the range of 55% to 60%. Wall motion was normal; there were no regional wall motion abnormalities. Doppler parameters are consistent  with abnormal left ventricular relaxation (grade 1 diastolic dysfunction). - Mitral valve: There was moderate regurgitation. - Atrial septum: No defect or patent foramen ovale was identified. - Tricuspid valve: There was mild regurgitation. - Pulmonic valve: There was mild regurgitation. - Pulmonary arteries: PA peak pressure: 41 mm Hg (S). - Systemic veins: The IVC was suboptimally visualized, but appeared dilated. There was normal respiratory variation. Estimated RAP 8 mmHg.   Jan 2022 7 day zio patch  7 day monitor  Min HR 55, Max HR 119, Avg HR 69  Frequent supraventricular ectopy in the form of isolated PACs, couplets, triplets  Rare ventricular ectopy in the form of isolated PVCs.  Reported symptoms correlated with sinus rhythm with PACs    Assessment and Plan  1. HTN -she is at goal, continue current meds  2. Hyperlipidemia - at goal, continue statin  3. Palpitations - monitor with frequent PACs, no concerning arrhythmias - symptoms have improved, if increase could titrate up bystolie  4. Epigastric pain - d/c OTC prilosec, start protonix 40mg  daily and monitor symptoms     Arnoldo Lenis, M.D.

## 2020-07-27 NOTE — Patient Instructions (Signed)
Your physician recommends that you schedule a follow-up appointment in: [facility] has recommended you make the following change in your medication:   STOP PRILOSEC   START PROTONIX 40MG  DAILY   Thank you for choosing [facility]!!

## 2020-07-28 DIAGNOSIS — E871 Hypo-osmolality and hyponatremia: Secondary | ICD-10-CM | POA: Diagnosis not present

## 2020-07-28 DIAGNOSIS — G4733 Obstructive sleep apnea (adult) (pediatric): Secondary | ICD-10-CM | POA: Diagnosis not present

## 2020-07-28 DIAGNOSIS — E875 Hyperkalemia: Secondary | ICD-10-CM | POA: Diagnosis not present

## 2020-07-28 DIAGNOSIS — Z8673 Personal history of transient ischemic attack (TIA), and cerebral infarction without residual deficits: Secondary | ICD-10-CM | POA: Diagnosis not present

## 2020-07-28 DIAGNOSIS — F411 Generalized anxiety disorder: Secondary | ICD-10-CM | POA: Diagnosis not present

## 2020-07-28 DIAGNOSIS — F331 Major depressive disorder, recurrent, moderate: Secondary | ICD-10-CM | POA: Diagnosis not present

## 2020-07-28 DIAGNOSIS — E039 Hypothyroidism, unspecified: Secondary | ICD-10-CM | POA: Diagnosis not present

## 2020-07-28 DIAGNOSIS — E559 Vitamin D deficiency, unspecified: Secondary | ICD-10-CM | POA: Diagnosis not present

## 2020-07-28 DIAGNOSIS — M1990 Unspecified osteoarthritis, unspecified site: Secondary | ICD-10-CM | POA: Diagnosis not present

## 2020-07-28 DIAGNOSIS — E782 Mixed hyperlipidemia: Secondary | ICD-10-CM | POA: Diagnosis not present

## 2020-07-28 DIAGNOSIS — I10 Essential (primary) hypertension: Secondary | ICD-10-CM | POA: Diagnosis not present

## 2020-07-28 DIAGNOSIS — K219 Gastro-esophageal reflux disease without esophagitis: Secondary | ICD-10-CM | POA: Diagnosis not present

## 2020-08-01 DIAGNOSIS — E039 Hypothyroidism, unspecified: Secondary | ICD-10-CM | POA: Diagnosis not present

## 2020-08-01 DIAGNOSIS — E785 Hyperlipidemia, unspecified: Secondary | ICD-10-CM | POA: Diagnosis not present

## 2020-08-01 DIAGNOSIS — K219 Gastro-esophageal reflux disease without esophagitis: Secondary | ICD-10-CM | POA: Diagnosis not present

## 2020-08-01 DIAGNOSIS — I10 Essential (primary) hypertension: Secondary | ICD-10-CM | POA: Diagnosis not present

## 2020-08-18 DIAGNOSIS — G4733 Obstructive sleep apnea (adult) (pediatric): Secondary | ICD-10-CM | POA: Diagnosis not present

## 2020-08-26 DIAGNOSIS — G4733 Obstructive sleep apnea (adult) (pediatric): Secondary | ICD-10-CM | POA: Diagnosis not present

## 2020-09-07 DIAGNOSIS — E039 Hypothyroidism, unspecified: Secondary | ICD-10-CM | POA: Diagnosis not present

## 2020-09-07 DIAGNOSIS — E875 Hyperkalemia: Secondary | ICD-10-CM | POA: Diagnosis not present

## 2020-09-07 DIAGNOSIS — I959 Hypotension, unspecified: Secondary | ICD-10-CM | POA: Diagnosis not present

## 2020-09-07 DIAGNOSIS — I10 Essential (primary) hypertension: Secondary | ICD-10-CM | POA: Diagnosis not present

## 2020-09-07 DIAGNOSIS — M810 Age-related osteoporosis without current pathological fracture: Secondary | ICD-10-CM | POA: Diagnosis not present

## 2020-09-07 DIAGNOSIS — E785 Hyperlipidemia, unspecified: Secondary | ICD-10-CM | POA: Diagnosis not present

## 2020-09-07 DIAGNOSIS — M180 Bilateral primary osteoarthritis of first carpometacarpal joints: Secondary | ICD-10-CM | POA: Diagnosis not present

## 2020-09-07 DIAGNOSIS — E559 Vitamin D deficiency, unspecified: Secondary | ICD-10-CM | POA: Diagnosis not present

## 2020-09-07 DIAGNOSIS — E782 Mixed hyperlipidemia: Secondary | ICD-10-CM | POA: Diagnosis not present

## 2020-09-07 DIAGNOSIS — M1990 Unspecified osteoarthritis, unspecified site: Secondary | ICD-10-CM | POA: Diagnosis not present

## 2020-09-18 DIAGNOSIS — G4733 Obstructive sleep apnea (adult) (pediatric): Secondary | ICD-10-CM | POA: Diagnosis not present

## 2020-09-26 DIAGNOSIS — G4733 Obstructive sleep apnea (adult) (pediatric): Secondary | ICD-10-CM | POA: Diagnosis not present

## 2020-10-02 DIAGNOSIS — E7800 Pure hypercholesterolemia, unspecified: Secondary | ICD-10-CM | POA: Diagnosis not present

## 2020-10-02 DIAGNOSIS — I10 Essential (primary) hypertension: Secondary | ICD-10-CM | POA: Diagnosis not present

## 2020-10-02 DIAGNOSIS — M1990 Unspecified osteoarthritis, unspecified site: Secondary | ICD-10-CM | POA: Diagnosis not present

## 2020-10-18 DIAGNOSIS — G4733 Obstructive sleep apnea (adult) (pediatric): Secondary | ICD-10-CM | POA: Diagnosis not present

## 2020-10-19 ENCOUNTER — Telehealth: Payer: Self-pay | Admitting: Cardiology

## 2020-10-19 NOTE — Telephone Encounter (Signed)
Reports BP was elevated at dentist office Monday, BP was 186/105 at 2:00 pm while at dentist office. Rechecked at home same day and was 165/91 late evening 1 hour after taking bedtime medications.  05/17 BP 171/95 checked 7:00 pm 05/18 BP 164/93 @ 2:20 pm 05/18 BP 123/70 this morning when she woke up Not able to weigh at home Denies chest pain, dizziness, sob or swelling in extremities Reports having a lot of gas in stomach Medication reviewed. Hydralazine 10 mg BID and losartan 25 mg recently added by PCP Advised to regularly monitor and record your blood pressure readings at home twice daily, 1-2 hours after BP meds, using the same machine at the same time of day to check your readings and record them. Call office in 1 week with results. Advised to contact PCP about abdominal gas and headache Verbalized understanding of plan.

## 2020-10-19 NOTE — Telephone Encounter (Signed)
Pt c/o BP issue:   1. What are your last 5 BP readings?  5/16  186/105 5/17   171/95 5/18   164/93  2. Are you having any other symptoms (ex. Dizziness, headache, blurred vision, passed out ?  Slight headache   3. What is your BP issue? States that her body feels like a water bottle. Stomach is swollen.

## 2020-10-19 NOTE — Telephone Encounter (Signed)
Agree with getting more bp data to decide on any changes given they are up and down   Zandra Abts MD

## 2020-10-24 ENCOUNTER — Ambulatory Visit (HOSPITAL_COMMUNITY): Payer: PPO

## 2020-10-25 ENCOUNTER — Encounter (HOSPITAL_COMMUNITY): Payer: Self-pay

## 2020-10-25 ENCOUNTER — Other Ambulatory Visit: Payer: Self-pay

## 2020-10-25 ENCOUNTER — Encounter (HOSPITAL_COMMUNITY)
Admission: RE | Admit: 2020-10-25 | Discharge: 2020-10-25 | Disposition: A | Discharge status: Home / Self Care | Payer: PPO | Source: Ambulatory Visit | Attending: Internal Medicine | Admitting: Internal Medicine

## 2020-10-25 DIAGNOSIS — M810 Age-related osteoporosis without current pathological fracture: Secondary | ICD-10-CM | POA: Diagnosis not present

## 2020-10-25 MED ORDER — DENOSUMAB 60 MG/ML ~~LOC~~ SOSY
60.0000 mg | PREFILLED_SYRINGE | Freq: Once | SUBCUTANEOUS | Status: AC
  Administered 2020-10-25: 60 mg via SUBCUTANEOUS

## 2020-10-26 DIAGNOSIS — G4733 Obstructive sleep apnea (adult) (pediatric): Secondary | ICD-10-CM | POA: Diagnosis not present

## 2020-11-01 DIAGNOSIS — I10 Essential (primary) hypertension: Secondary | ICD-10-CM | POA: Diagnosis not present

## 2020-11-01 DIAGNOSIS — E7800 Pure hypercholesterolemia, unspecified: Secondary | ICD-10-CM | POA: Diagnosis not present

## 2020-11-07 ENCOUNTER — Telehealth: Payer: Self-pay | Admitting: Cardiology

## 2020-11-07 NOTE — Telephone Encounter (Signed)
Calling to give BP readings   [phone]

## 2020-11-07 NOTE — Telephone Encounter (Signed)
05/19 125/70 am 141/83 pm 05/20 127/75 am 154/87 pm 05/21 127/71 am 162/86 pm 05/22 160/86 am 159/92 pm 05/23 160/84 am 164/99 pm 05/24 145/84 am 131/71 pm 05/25 130/69 am 144/87 pm--hydralazine increased to 25 mg BID by PCP 05/26 147/86 pm 05/27 124/68 am 155/92 pm 05/28 157/84 am 144/88 pm 05/29 160/90 am 140/83 pm 05/30 141/78 am 144/81 pm 05/31 137/72 am 166/98 pm 06/01 173/89 pm 06/02 130/67 am 146/85 pm 06/03 155/83 am 171/99 pm 06/04 181/101 am 133/72 pm 06/05 164/94 am 131/79 pm 06/06 146/78 am

## 2020-11-09 DIAGNOSIS — I10 Essential (primary) hypertension: Secondary | ICD-10-CM | POA: Diagnosis not present

## 2020-11-09 DIAGNOSIS — N390 Urinary tract infection, site not specified: Secondary | ICD-10-CM | POA: Diagnosis not present

## 2020-11-09 DIAGNOSIS — R58 Hemorrhage, not elsewhere classified: Secondary | ICD-10-CM | POA: Diagnosis not present

## 2020-11-09 DIAGNOSIS — R5383 Other fatigue: Secondary | ICD-10-CM | POA: Diagnosis not present

## 2020-11-10 MED ORDER — HYDRALAZINE HCL 50 MG PO TABS: 50.0000 mg | ORAL_TABLET | Freq: Two times a day (BID) | ORAL | 1 refills | Status: AC

## 2020-11-10 NOTE — Telephone Encounter (Signed)
BP's remain too high, can she increase hydralazine to 50mg  bid   Zandra Abts MD         Patient informed and verbalized understanding of plan.

## 2020-11-18 DIAGNOSIS — G4733 Obstructive sleep apnea (adult) (pediatric): Secondary | ICD-10-CM | POA: Diagnosis not present

## 2020-11-25 DIAGNOSIS — G4733 Obstructive sleep apnea (adult) (pediatric): Secondary | ICD-10-CM | POA: Diagnosis not present

## 2020-11-30 DIAGNOSIS — M546 Pain in thoracic spine: Secondary | ICD-10-CM | POA: Diagnosis not present

## 2020-11-30 DIAGNOSIS — M9902 Segmental and somatic dysfunction of thoracic region: Secondary | ICD-10-CM | POA: Diagnosis not present

## 2020-11-30 DIAGNOSIS — M62830 Muscle spasm of back: Secondary | ICD-10-CM | POA: Diagnosis not present

## 2020-11-30 DIAGNOSIS — M9903 Segmental and somatic dysfunction of lumbar region: Secondary | ICD-10-CM | POA: Diagnosis not present

## 2020-11-30 DIAGNOSIS — M9905 Segmental and somatic dysfunction of pelvic region: Secondary | ICD-10-CM | POA: Diagnosis not present

## 2020-12-01 DIAGNOSIS — M1990 Unspecified osteoarthritis, unspecified site: Secondary | ICD-10-CM | POA: Diagnosis not present

## 2020-12-01 DIAGNOSIS — I10 Essential (primary) hypertension: Secondary | ICD-10-CM | POA: Diagnosis not present

## 2020-12-01 DIAGNOSIS — E7800 Pure hypercholesterolemia, unspecified: Secondary | ICD-10-CM | POA: Diagnosis not present

## 2020-12-02 DIAGNOSIS — M9905 Segmental and somatic dysfunction of pelvic region: Secondary | ICD-10-CM | POA: Diagnosis not present

## 2020-12-02 DIAGNOSIS — M9902 Segmental and somatic dysfunction of thoracic region: Secondary | ICD-10-CM | POA: Diagnosis not present

## 2020-12-02 DIAGNOSIS — M9903 Segmental and somatic dysfunction of lumbar region: Secondary | ICD-10-CM | POA: Diagnosis not present

## 2020-12-02 DIAGNOSIS — M546 Pain in thoracic spine: Secondary | ICD-10-CM | POA: Diagnosis not present

## 2020-12-02 DIAGNOSIS — M62830 Muscle spasm of back: Secondary | ICD-10-CM | POA: Diagnosis not present

## 2020-12-06 DIAGNOSIS — M9902 Segmental and somatic dysfunction of thoracic region: Secondary | ICD-10-CM | POA: Diagnosis not present

## 2020-12-06 DIAGNOSIS — M62830 Muscle spasm of back: Secondary | ICD-10-CM | POA: Diagnosis not present

## 2020-12-06 DIAGNOSIS — M9903 Segmental and somatic dysfunction of lumbar region: Secondary | ICD-10-CM | POA: Diagnosis not present

## 2020-12-06 DIAGNOSIS — M546 Pain in thoracic spine: Secondary | ICD-10-CM | POA: Diagnosis not present

## 2020-12-06 DIAGNOSIS — M9905 Segmental and somatic dysfunction of pelvic region: Secondary | ICD-10-CM | POA: Diagnosis not present

## 2020-12-09 DIAGNOSIS — M9903 Segmental and somatic dysfunction of lumbar region: Secondary | ICD-10-CM | POA: Diagnosis not present

## 2020-12-09 DIAGNOSIS — M9902 Segmental and somatic dysfunction of thoracic region: Secondary | ICD-10-CM | POA: Diagnosis not present

## 2020-12-09 DIAGNOSIS — M62830 Muscle spasm of back: Secondary | ICD-10-CM | POA: Diagnosis not present

## 2020-12-09 DIAGNOSIS — M9905 Segmental and somatic dysfunction of pelvic region: Secondary | ICD-10-CM | POA: Diagnosis not present

## 2020-12-09 DIAGNOSIS — M546 Pain in thoracic spine: Secondary | ICD-10-CM | POA: Diagnosis not present

## 2020-12-13 DIAGNOSIS — Z131 Encounter for screening for diabetes mellitus: Secondary | ICD-10-CM | POA: Diagnosis not present

## 2020-12-13 DIAGNOSIS — E782 Mixed hyperlipidemia: Secondary | ICD-10-CM | POA: Diagnosis not present

## 2020-12-13 DIAGNOSIS — E039 Hypothyroidism, unspecified: Secondary | ICD-10-CM | POA: Diagnosis not present

## 2020-12-14 DIAGNOSIS — M546 Pain in thoracic spine: Secondary | ICD-10-CM | POA: Diagnosis not present

## 2020-12-14 DIAGNOSIS — M62830 Muscle spasm of back: Secondary | ICD-10-CM | POA: Diagnosis not present

## 2020-12-14 DIAGNOSIS — M9903 Segmental and somatic dysfunction of lumbar region: Secondary | ICD-10-CM | POA: Diagnosis not present

## 2020-12-14 DIAGNOSIS — M9902 Segmental and somatic dysfunction of thoracic region: Secondary | ICD-10-CM | POA: Diagnosis not present

## 2020-12-14 DIAGNOSIS — M9905 Segmental and somatic dysfunction of pelvic region: Secondary | ICD-10-CM | POA: Diagnosis not present

## 2020-12-18 DIAGNOSIS — G4733 Obstructive sleep apnea (adult) (pediatric): Secondary | ICD-10-CM | POA: Diagnosis not present

## 2020-12-26 DIAGNOSIS — G4733 Obstructive sleep apnea (adult) (pediatric): Secondary | ICD-10-CM | POA: Diagnosis not present

## 2021-01-01 DIAGNOSIS — E7800 Pure hypercholesterolemia, unspecified: Secondary | ICD-10-CM | POA: Diagnosis not present

## 2021-01-01 DIAGNOSIS — M1990 Unspecified osteoarthritis, unspecified site: Secondary | ICD-10-CM | POA: Diagnosis not present

## 2021-01-01 DIAGNOSIS — I10 Essential (primary) hypertension: Secondary | ICD-10-CM | POA: Diagnosis not present

## 2021-01-06 DIAGNOSIS — G3184 Mild cognitive impairment, so stated: Secondary | ICD-10-CM | POA: Diagnosis not present

## 2021-01-06 DIAGNOSIS — M9902 Segmental and somatic dysfunction of thoracic region: Secondary | ICD-10-CM | POA: Diagnosis not present

## 2021-01-06 DIAGNOSIS — M62830 Muscle spasm of back: Secondary | ICD-10-CM | POA: Diagnosis not present

## 2021-01-06 DIAGNOSIS — Z8673 Personal history of transient ischemic attack (TIA), and cerebral infarction without residual deficits: Secondary | ICD-10-CM | POA: Diagnosis not present

## 2021-01-06 DIAGNOSIS — M1990 Unspecified osteoarthritis, unspecified site: Secondary | ICD-10-CM | POA: Diagnosis not present

## 2021-01-06 DIAGNOSIS — K219 Gastro-esophageal reflux disease without esophagitis: Secondary | ICD-10-CM | POA: Diagnosis not present

## 2021-01-06 DIAGNOSIS — M546 Pain in thoracic spine: Secondary | ICD-10-CM | POA: Diagnosis not present

## 2021-01-06 DIAGNOSIS — E559 Vitamin D deficiency, unspecified: Secondary | ICD-10-CM | POA: Diagnosis not present

## 2021-01-06 DIAGNOSIS — F329 Major depressive disorder, single episode, unspecified: Secondary | ICD-10-CM | POA: Diagnosis not present

## 2021-01-06 DIAGNOSIS — Z0001 Encounter for general adult medical examination with abnormal findings: Secondary | ICD-10-CM | POA: Diagnosis not present

## 2021-01-06 DIAGNOSIS — M9905 Segmental and somatic dysfunction of pelvic region: Secondary | ICD-10-CM | POA: Diagnosis not present

## 2021-01-06 DIAGNOSIS — M9903 Segmental and somatic dysfunction of lumbar region: Secondary | ICD-10-CM | POA: Diagnosis not present

## 2021-01-06 DIAGNOSIS — R062 Wheezing: Secondary | ICD-10-CM | POA: Diagnosis not present

## 2021-01-09 DIAGNOSIS — M9902 Segmental and somatic dysfunction of thoracic region: Secondary | ICD-10-CM | POA: Diagnosis not present

## 2021-01-09 DIAGNOSIS — M9903 Segmental and somatic dysfunction of lumbar region: Secondary | ICD-10-CM | POA: Diagnosis not present

## 2021-01-09 DIAGNOSIS — M62830 Muscle spasm of back: Secondary | ICD-10-CM | POA: Diagnosis not present

## 2021-01-09 DIAGNOSIS — M546 Pain in thoracic spine: Secondary | ICD-10-CM | POA: Diagnosis not present

## 2021-01-09 DIAGNOSIS — M9905 Segmental and somatic dysfunction of pelvic region: Secondary | ICD-10-CM | POA: Diagnosis not present

## 2021-01-13 DIAGNOSIS — M9903 Segmental and somatic dysfunction of lumbar region: Secondary | ICD-10-CM | POA: Diagnosis not present

## 2021-01-13 DIAGNOSIS — M62830 Muscle spasm of back: Secondary | ICD-10-CM | POA: Diagnosis not present

## 2021-01-13 DIAGNOSIS — M9902 Segmental and somatic dysfunction of thoracic region: Secondary | ICD-10-CM | POA: Diagnosis not present

## 2021-01-13 DIAGNOSIS — M546 Pain in thoracic spine: Secondary | ICD-10-CM | POA: Diagnosis not present

## 2021-01-13 DIAGNOSIS — M9905 Segmental and somatic dysfunction of pelvic region: Secondary | ICD-10-CM | POA: Diagnosis not present

## 2021-01-18 DIAGNOSIS — G4733 Obstructive sleep apnea (adult) (pediatric): Secondary | ICD-10-CM | POA: Diagnosis not present

## 2021-01-25 DIAGNOSIS — G4733 Obstructive sleep apnea (adult) (pediatric): Secondary | ICD-10-CM | POA: Diagnosis not present

## 2021-02-01 DIAGNOSIS — M1990 Unspecified osteoarthritis, unspecified site: Secondary | ICD-10-CM | POA: Diagnosis not present

## 2021-02-01 DIAGNOSIS — E119 Type 2 diabetes mellitus without complications: Secondary | ICD-10-CM | POA: Diagnosis not present

## 2021-02-01 DIAGNOSIS — I10 Essential (primary) hypertension: Secondary | ICD-10-CM | POA: Diagnosis not present

## 2021-02-16 ENCOUNTER — Telehealth: Payer: Self-pay | Admitting: Cardiology

## 2021-02-16 ENCOUNTER — Encounter: Payer: Self-pay | Admitting: *Deleted

## 2021-02-16 ENCOUNTER — Ambulatory Visit: Payer: PPO | Admitting: Cardiology

## 2021-02-16 ENCOUNTER — Encounter: Payer: Self-pay | Admitting: Cardiology

## 2021-02-16 VITALS — BP 130/80 | HR 74 | Ht 60.0 in | Wt 213.0 lb

## 2021-02-16 DIAGNOSIS — I10 Essential (primary) hypertension: Secondary | ICD-10-CM | POA: Diagnosis not present

## 2021-02-16 DIAGNOSIS — E782 Mixed hyperlipidemia: Secondary | ICD-10-CM

## 2021-02-16 NOTE — Patient Instructions (Addendum)
Medication Instructions:  Continue all current medications.  Labwork: none  Testing/Procedures: none  Follow-Up: 1 month   Any Other Special Instructions Will Be Listed Below (If Applicable).  If you need a refill on your cardiac medications before your next appointment, please call your pharmacy.  

## 2021-02-16 NOTE — Telephone Encounter (Signed)
  Patient Consent for Virtual Visit        [name] has provided verbal consent on 02/16/2021 for a virtual visit (video or telephone).   CONSENT FOR VIRTUAL VISIT FOR:  [name]  By participating in this virtual visit I agree to the following:  I hereby voluntarily request, consent and authorize [facility] and its employed or contracted physicians, physician assistants, nurse practitioners or other licensed health care professionals (the Practitioner), to provide me with telemedicine health care services (the "Services") as deemed necessary by the treating Practitioner. I acknowledge and consent to receive the Services by the Practitioner via telemedicine. I understand that the telemedicine visit will involve communicating with the Practitioner through live audiovisual communication technology and the disclosure of certain medical information by electronic transmission. I acknowledge that I have been given the opportunity to request an in-person assessment or other available alternative prior to the telemedicine visit and am voluntarily participating in the telemedicine visit.  I understand that I have the right to withhold or withdraw my consent to the use of telemedicine in the course of my care at any time, without affecting my right to future care or treatment, and that the Practitioner or I may terminate the telemedicine visit at any time. I understand that I have the right to inspect all information obtained and/or recorded in the course of the telemedicine visit and may receive copies of available information for a reasonable fee.  I understand that some of the potential risks of receiving the Services via telemedicine include:  Delay or interruption in medical evaluation due to technological equipment failure or disruption; Information transmitted may not be sufficient (e.g. poor resolution of images) to allow for appropriate medical decision making by the Practitioner;  and/or  In rare instances, security protocols could fail, causing a breach of personal health information.  Furthermore, I acknowledge that it is my responsibility to provide information about my medical history, conditions and care that is complete and accurate to the best of my ability. I acknowledge that Practitioner's advice, recommendations, and/or decision may be based on factors not within their control, such as incomplete or inaccurate data provided by me or distortions of diagnostic images or specimens that may result from electronic transmissions. I understand that the practice of medicine is not an exact science and that Practitioner makes no warranties or guarantees regarding treatment outcomes. I acknowledge that a copy of this consent can be made available to me via my patient portal ([facility]), or I can request a printed copy by calling the office of [facility].    I understand that my insurance will be billed for this visit.   I have read or had this consent read to me. I understand the contents of this consent, which adequately explains the benefits and risks of the Services being provided via telemedicine.  I have been provided ample opportunity to ask questions regarding this consent and the Services and have had my questions answered to my satisfaction. I give my informed consent for the services to be provided through the use of telemedicine in my medical care

## 2021-02-16 NOTE — Progress Notes (Signed)
Clinical Summary [name] is a 75 y.o.female seen today for follow up of the following medical problems.    1. HTN - off HCTZ due to low Na during Jan 2021 admission for TIA. Norvasc '10mg'$  was also stopped at that time to allower permissive HTN, eventually restarted at lower dose of '5mg'$  daily   - pcp lowered norvasc to 2.'5mg'$  daily. Appears today losartan is also lowered to '25mg'$  daily, unclear history of this.      - home sbp's homes 130s-160s/   2. TIA - admission Jan 2021 - on high dose ASA, statin   3. Hyperlipidemia - 09/2019 TC 139 TG 54 HDL 72 LDL 55 - 03/2020 TC 136 TG 49 HDL 74 LDL 51 - compliant with statin     4. OSA - she is on cpap   5. Palpitations - can have some palpitations, 3-4 times a week. Lasts up to 5-15 minutes   -denies heavy caffine or EtOH   Jan 2022 monitor frequent PACS, rare PVCs - no recent significant symptoms     Past Medical History:  Diagnosis Date   Anemia    Anxiety    Arthritis    Complication of anesthesia    "pt. holds her breath when waking up"   Depression    GERD (gastroesophageal reflux disease)    Hypercholesteremia    Hypertension    Hypothyroidism    Osteoporosis    PONV (postoperative nausea and vomiting)    Staph infection    abdomen and returned 5 yrs. later     Allergies  Allergen Reactions   Sulfamethoxazole Swelling    Lips swell and feel numb     Current Outpatient Medications  Medication Sig Dispense Refill   ALPRAZolam (XANAX) 0.25 MG tablet Take 0.25 mg by mouth at bedtime as needed for anxiety.     amLODipine (NORVASC) 2.5 MG tablet Take 2.5 mg by mouth daily.     aspirin 325 MG tablet Take 1 tablet (325 mg total) by mouth daily with breakfast. 30 tablet 3   atorvastatin (LIPITOR) 80 MG tablet Take 80 mg by mouth daily.     denosumab (PROLIA) 60 MG/ML SOSY injection Inject 60 mg into the skin every 6 (six) months.     DULoxetine (CYMBALTA) 60 MG capsule Take 60 mg by mouth every morning.      hydrALAZINE (APRESOLINE) 50 MG tablet Take 1 tablet (50 mg total) by mouth 2 (two) times daily. 180 tablet 1   levothyroxine (SYNTHROID) 50 MCG tablet Take 50 mcg by mouth daily before breakfast.      losartan (COZAAR) 25 MG tablet Take 25 mg by mouth daily.     MAGNESIUM PO Take 3 tablets by mouth every evening.      nebivolol (BYSTOLIC) 10 MG tablet Take 1 tablet (10 mg total) by mouth at bedtime. 30 tablet 3   pantoprazole (PROTONIX) 40 MG tablet Take 1 tablet (40 mg total) by mouth daily. 90 tablet 1   No current facility-administered medications for this visit.     Past Surgical History:  Procedure Laterality Date   ABDOMINAL HYSTERECTOMY     CATARACT EXTRACTION W/PHACO Right 01/22/2019   Procedure: CATARACT EXTRACTION PHACO AND INTRAOCULAR LENS PLACEMENT (IOC);  Surgeon: [name], MD;  Location: [facility];  Service: Ophthalmology;  Laterality: Right;  right, CDE: 19.63   CATARACT EXTRACTION W/PHACO Left 02/16/2019   Procedure: CATARACT EXTRACTION PHACO AND INTRAOCULAR LENS PLACEMENT LEFT EYE (CDE: 14.36);  Surgeon: [name], MD;  Location: [facility];  Service: Ophthalmology;  Laterality: Left;   CERVICAL FUSION  1990's   COLON SURGERY     FOOT SURGERY Left    reshaped   HERNIA REPAIR     HIP ARTHROPLASTY Left    JOINT REPLACEMENT Right    hip   LUMBAR SPINE SURGERY     REPLACEMENT TOTAL KNEE Right    THYROIDECTOMY, PARTIAL     TOTAL KNEE ARTHROPLASTY Left 08/21/2016   Procedure: LEFT TOTAL KNEE ARTHROPLASTY;  Surgeon: Renette Butters, MD;  Location: [facility];  Service: Orthopedics;  Laterality: Left;     Allergies  Allergen Reactions   Sulfamethoxazole Swelling    Lips swell and feel numb      No family history on file.   Social History [name] reports that she has never smoked. She has never used smokeless tobacco. [name] reports no history of alcohol use.   Review of Systems CONSTITUTIONAL: No weight loss, fever, chills, weakness or fatigue.   HEENT: Eyes: No visual loss, blurred vision, double vision or yellow sclerae.No hearing loss, sneezing, congestion, runny nose or sore throat.  SKIN: No rash or itching.  CARDIOVASCULAR: per hpi RESPIRATORY: No shortness of breath, cough or sputum.  GASTROINTESTINAL: No anorexia, nausea, vomiting or diarrhea. No abdominal pain or blood.  GENITOURINARY: No burning on urination, no polyuria NEUROLOGICAL: No headache, dizziness, syncope, paralysis, ataxia, numbness or tingling in the extremities. No change in bowel or bladder control.  MUSCULOSKELETAL: No muscle, back pain, joint pain or stiffness.  LYMPHATICS: No enlarged nodes. No history of splenectomy.  PSYCHIATRIC: No history of depression or anxiety.  ENDOCRINOLOGIC: No reports of sweating, cold or heat intolerance. No polyuria or polydipsia.  Marland Kitchen   Physical Examination Today's Vitals   02/16/21 1303  BP: 130/80  Pulse: 74  SpO2: 97%  Weight: 213 lb (96.6 kg)  Height: 5' (1.524 m)   Body mass index is 41.6 kg/m.  Gen: resting comfortably, no acute distress HEENT: no scleral icterus, pupils equal round and reactive, no palptable cervical adenopathy,  CV: RRR, no m/r/g no jvd Resp: Clear to auscultation bilaterally GI: abdomen is soft, non-tender, non-distended, normal bowel sounds, no hepatosplenomegaly MSK: extremities are warm, no edema.  Skin: warm, no rash Neuro:  no focal deficits Psych: appropriate affect   Diagnostic Studies  03/2017 echo Study Conclusions   - Left ventricle: The cavity size was normal. Systolic function was   normal. The estimated ejection fraction was in the range of 55%   to 60%. Wall motion was normal; there were no regional wall   motion abnormalities. Doppler parameters are consistent with   abnormal left ventricular relaxation (grade 1 diastolic   dysfunction). - Mitral valve: There was moderate regurgitation. - Atrial septum: No defect or patent foramen ovale was identified. -  Tricuspid valve: There was mild regurgitation. - Pulmonic valve: There was mild regurgitation. - Pulmonary arteries: PA peak pressure: 41 mm Hg (S). - Systemic veins: The IVC was suboptimally visualized, but appeared   dilated. There was normal respiratory variation. Estimated RAP 8   mmHg.     Jan 2022 7 day zio patch 7 day monitor Min HR 55, Max HR 119, Avg HR 69 Frequent supraventricular ectopy in the form of isolated PACs, couplets, triplets Rare ventricular ectopy in the form of isolated PVCs. Reported symptoms correlated with sinus rhythm with PACs   Assessment and Plan  1. HTN - home bp's elevated. I  am not clear on recent changes in her bp meds including the lowering of her losartan to '25mg'$  daily. Will request notes and labs from pcp. Would either increase losartan again or increase her hydralazine at f/u   2. Hyperlipidemia - continue meds, request pcp labs   EKG today shows NSR, LAFB     Arnoldo Lenis, M.D.

## 2021-02-18 DIAGNOSIS — G4733 Obstructive sleep apnea (adult) (pediatric): Secondary | ICD-10-CM | POA: Diagnosis not present

## 2021-02-20 DIAGNOSIS — J069 Acute upper respiratory infection, unspecified: Secondary | ICD-10-CM | POA: Diagnosis not present

## 2021-02-20 DIAGNOSIS — R059 Cough, unspecified: Secondary | ICD-10-CM | POA: Diagnosis not present

## 2021-02-24 DIAGNOSIS — G4733 Obstructive sleep apnea (adult) (pediatric): Secondary | ICD-10-CM | POA: Diagnosis not present

## 2021-03-03 ENCOUNTER — Other Ambulatory Visit: Payer: Self-pay

## 2021-03-03 ENCOUNTER — Encounter (HOSPITAL_COMMUNITY): Payer: Self-pay

## 2021-03-03 ENCOUNTER — Emergency Department (HOSPITAL_COMMUNITY)
Admission: EM | Admit: 2021-03-03 | Discharge: 2021-03-03 | Disposition: A | Discharge status: Home / Self Care | Payer: PPO | Attending: Emergency Medicine | Admitting: Emergency Medicine

## 2021-03-03 ENCOUNTER — Emergency Department (HOSPITAL_COMMUNITY): Payer: PPO

## 2021-03-03 DIAGNOSIS — Z20822 Contact with and (suspected) exposure to covid-19: Secondary | ICD-10-CM | POA: Diagnosis not present

## 2021-03-03 DIAGNOSIS — Z79899 Other long term (current) drug therapy: Secondary | ICD-10-CM | POA: Insufficient documentation

## 2021-03-03 DIAGNOSIS — Z7982 Long term (current) use of aspirin: Secondary | ICD-10-CM | POA: Diagnosis not present

## 2021-03-03 DIAGNOSIS — Z8616 Personal history of COVID-19: Secondary | ICD-10-CM | POA: Diagnosis not present

## 2021-03-03 DIAGNOSIS — E119 Type 2 diabetes mellitus without complications: Secondary | ICD-10-CM | POA: Diagnosis not present

## 2021-03-03 DIAGNOSIS — Z28310 Unvaccinated for covid-19: Secondary | ICD-10-CM | POA: Diagnosis not present

## 2021-03-03 DIAGNOSIS — E039 Hypothyroidism, unspecified: Secondary | ICD-10-CM | POA: Insufficient documentation

## 2021-03-03 DIAGNOSIS — R059 Cough, unspecified: Secondary | ICD-10-CM | POA: Diagnosis not present

## 2021-03-03 DIAGNOSIS — J209 Acute bronchitis, unspecified: Secondary | ICD-10-CM

## 2021-03-03 DIAGNOSIS — R062 Wheezing: Secondary | ICD-10-CM | POA: Diagnosis not present

## 2021-03-03 DIAGNOSIS — Z96641 Presence of right artificial hip joint: Secondary | ICD-10-CM | POA: Insufficient documentation

## 2021-03-03 DIAGNOSIS — I10 Essential (primary) hypertension: Secondary | ICD-10-CM | POA: Diagnosis not present

## 2021-03-03 DIAGNOSIS — Z96653 Presence of artificial knee joint, bilateral: Secondary | ICD-10-CM | POA: Diagnosis not present

## 2021-03-03 DIAGNOSIS — R000 Tachycardia, unspecified: Secondary | ICD-10-CM | POA: Diagnosis not present

## 2021-03-03 DIAGNOSIS — R079 Chest pain, unspecified: Secondary | ICD-10-CM | POA: Diagnosis not present

## 2021-03-03 DIAGNOSIS — R0600 Dyspnea, unspecified: Secondary | ICD-10-CM | POA: Diagnosis not present

## 2021-03-03 DIAGNOSIS — M1990 Unspecified osteoarthritis, unspecified site: Secondary | ICD-10-CM | POA: Diagnosis not present

## 2021-03-03 LAB — CBC WITH DIFFERENTIAL/PLATELET
Abs Immature Granulocytes: 0.01 10*3/uL (ref 0.00–0.07)
Basophils Absolute: 0 10*3/uL (ref 0.0–0.1)
Basophils Relative: 1 %
Eosinophils Absolute: 0.3 10*3/uL (ref 0.0–0.5)
Eosinophils Relative: 6 %
HCT: 32.9 % — ABNORMAL LOW (ref 36.0–46.0)
Hemoglobin: 11 g/dL — ABNORMAL LOW (ref 12.0–15.0)
Immature Granulocytes: 0 %
Lymphocytes Relative: 21 %
Lymphs Abs: 1 10*3/uL (ref 0.7–4.0)
MCH: 28 pg (ref 26.0–34.0)
MCHC: 33.4 g/dL (ref 30.0–36.0)
MCV: 83.7 fL (ref 80.0–100.0)
Monocytes Absolute: 0.5 10*3/uL (ref 0.1–1.0)
Monocytes Relative: 11 %
Neutro Abs: 2.7 10*3/uL (ref 1.7–7.7)
Neutrophils Relative %: 61 %
Platelets: 260 10*3/uL (ref 150–400)
RBC: 3.93 MIL/uL (ref 3.87–5.11)
RDW: 14.8 % (ref 11.5–15.5)
WBC: 4.5 10*3/uL (ref 4.0–10.5)
nRBC: 0 % (ref 0.0–0.2)

## 2021-03-03 LAB — BASIC METABOLIC PANEL
Anion gap: 8 (ref 5–15)
BUN: 17 mg/dL (ref 8–23)
CO2: 25 mmol/L (ref 22–32)
Calcium: 8.8 mg/dL — ABNORMAL LOW (ref 8.9–10.3)
Chloride: 96 mmol/L — ABNORMAL LOW (ref 98–111)
Creatinine, Ser: 0.71 mg/dL (ref 0.44–1.00)
GFR, Estimated: >60 mL/min (ref >60)
Glucose, Bld: 106 mg/dL — ABNORMAL HIGH (ref 70–99)
Potassium: 3.9 mmol/L (ref 3.5–5.1)
Sodium: 129 mmol/L — ABNORMAL LOW (ref 135–145)

## 2021-03-03 LAB — RESP PANEL BY RT-PCR (FLU A&B, COVID) ARPGX2
Influenza A by PCR: NEGATIVE
Influenza B by PCR: NEGATIVE
SARS Coronavirus 2 by RT PCR: NEGATIVE

## 2021-03-03 MED ORDER — METHYLPREDNISOLONE SODIUM SUCC 125 MG IJ SOLR
125.0000 mg | Freq: Once | INTRAMUSCULAR | Status: DC
  Filled 2021-03-03: qty 2

## 2021-03-03 MED ORDER — ALBUTEROL SULFATE HFA 108 (90 BASE) MCG/ACT IN AERS
2.0000 | INHALATION_SPRAY | RESPIRATORY_TRACT | Status: DC | PRN
  Administered 2021-03-03: 2 via RESPIRATORY_TRACT
  Filled 2021-03-03 (×2): qty 6.7

## 2021-03-03 MED ORDER — PREDNISONE 10 MG (21) PO TBPK: ORAL_TABLET | Freq: Every day | ORAL | 0 refills | Status: DC

## 2021-03-03 MED ORDER — METHYLPREDNISOLONE SODIUM SUCC 125 MG IJ SOLR
125.0000 mg | Freq: Once | INTRAMUSCULAR | Status: AC
  Administered 2021-03-03: 125 mg via INTRAMUSCULAR

## 2021-03-03 MED ORDER — DOXYCYCLINE HYCLATE 100 MG PO CAPS: 100.0000 mg | ORAL_CAPSULE | Freq: Two times a day (BID) | ORAL | 0 refills | Status: DC

## 2021-03-03 NOTE — ED Notes (Signed)
Patient transported to X-ray 

## 2021-03-03 NOTE — ED Provider Notes (Signed)
  Physical Exam  BP (!) 145/76   Pulse 71   Temp 98 F (36.7 C) (Oral)   Resp 15   Ht 5' (1.524 m)   Wt 91.2 kg   SpO2 98%   BMI 39.26 kg/m   Physical Exam  ED Course/Procedures     Procedures  MDM  Pt comes in with cc of wheezing. She has been having wheezing and CP with cough x 3 weeks.  No CHF or COPD hx. Needs to be reassessed.  5:42 PM Patient's wheezing has resolved.  She denies any history of PE, DVT.  No calf tenderness.  X-rays reviewed independently by me, no evidence of pulmonary edema, pleural effusion.  She feels a lot better. Stable for discharge.  Dr. Gilford Raid had already written prescriptions for her, return precautions discussed.      Varney Biles, MD 03/03/21 1743

## 2021-03-03 NOTE — ED Triage Notes (Signed)
Pt. States they are wheezing and they have chest pain. Pt. Was sent by provider. Pt. States this has been on going on for 3 weeks.

## 2021-03-03 NOTE — ED Provider Notes (Signed)
[name]   CSN: [id number] Arrival date & time: 03/03/21  1303     History Chief Complaint  Patient presents with   Wheezing    [name] is a 75 y.o. female.  Pt presents to the ED today with wheezing and sob.  Pt said she has had sob for 3 weeks.  She has had a cough.  She finished a course of erythromycin a few days ago.  She is not vaccinated against Covid.  She has no had a recent covid test.  She denies fevers.  Sob much worse with movement.      Past Medical History:  Diagnosis Date   Anemia    Anxiety    Arthritis    Complication of anesthesia    "pt. holds her breath when waking up"   Depression    GERD (gastroesophageal reflux disease)    Hypercholesteremia    Hypertension    Hypothyroidism    Osteoporosis    PONV (postoperative nausea and vomiting)    Staph infection    abdomen and returned 5 yrs. later    Patient Active Problem List   Diagnosis Date Noted   COVID-19 virus infection 06/23/2019   Transient speech disturbance 06/22/2019   Hypothyroidism    Migraine equivalent 04/16/2017   Anxiety 04/16/2017   Palpitation 04/16/2017   Hyperlipidemia 04/16/2017   Essential hypertension 03/06/2017   Hypokalemia 03/06/2017   Hyponatremia 03/06/2017   TIA (transient ischemic attack) 03/05/2017   Primary localized osteoarthritis of left knee 08/22/2016   Paresthesia 08/10/2016   Weakness 08/10/2016   Depression 07/31/2016   S/P thyroidectomy 07/31/2016   Primary osteoarthritis of left knee 07/31/2016   S/P cervical spinal fusion 07/31/2016   S/P total knee arthroplasty, right 07/31/2016   SUBLUXATION, RADIAL HEAD 10/27/2009   CONTUSION, ELBOW 10/27/2009    Past Surgical History:  Procedure Laterality Date   ABDOMINAL HYSTERECTOMY     CATARACT EXTRACTION W/PHACO Right 01/22/2019   Procedure: CATARACT EXTRACTION PHACO AND INTRAOCULAR LENS PLACEMENT ([place]);  Surgeon: [name], MD;  Location: [facility];   Service: Ophthalmology;  Laterality: Right;  right, CDE: 19.63   CATARACT EXTRACTION W/PHACO Left 02/16/2019   Procedure: CATARACT EXTRACTION PHACO AND INTRAOCULAR LENS PLACEMENT LEFT EYE (CDE: 14.36);  Surgeon: [name], MD;  Location: [facility];  Service: Ophthalmology;  Laterality: Left;   CERVICAL FUSION  1990's   COLON SURGERY     FOOT SURGERY Left    reshaped   HERNIA REPAIR     HIP ARTHROPLASTY Left    JOINT REPLACEMENT Right    hip   LUMBAR SPINE SURGERY     REPLACEMENT TOTAL KNEE Right    THYROIDECTOMY, PARTIAL     TOTAL KNEE ARTHROPLASTY Left 08/21/2016   Procedure: LEFT TOTAL KNEE ARTHROPLASTY;  Surgeon: Renette Butters, MD;  Location: [facility];  Service: Orthopedics;  Laterality: Left;     OB History   No obstetric history on file.     History reviewed. No pertinent family history.  Social History   Tobacco Use   Smoking status: Never   Smokeless tobacco: Never  Vaping Use   Vaping Use: Never used  Substance Use Topics   Alcohol use: No   Drug use: No    Home Medications Prior to Admission medications   Medication Sig Start Date End Date Taking? Authorizing Provider  albuterol (VENTOLIN HFA) 108 (90 Base) MCG/ACT inhaler Inhale 2 puffs into the lungs every 4 (four)  hours as needed. 01/06/21  Yes [provider]  ALPRAZolam Duanne Moron) 0.25 MG tablet Take 0.25 mg by mouth at bedtime as needed for anxiety.   Yes [provider]  amLODipine (NORVASC) 2.5 MG tablet Take 2.5 mg by mouth daily.   Yes [provider]  aspirin 325 MG tablet Take 1 tablet (325 mg total) by mouth daily with breakfast. 06/23/19  Yes Emokpae, Courage, MD  atorvastatin (LIPITOR) 80 MG tablet Take 80 mg by mouth daily.   Yes [provider]  denosumab (PROLIA) 60 MG/ML SOSY injection Inject 60 mg into the skin every 6 (six) months.   Yes [name], MD  diclofenac (VOLTAREN) 75 MG EC tablet Take 75 mg by mouth 2 (two) times daily as needed. 02/10/21  Yes  [provider]  donepezil (ARICEPT) 5 MG tablet Take 5 mg by mouth daily. 01/29/21  Yes [provider]  doxycycline (VIBRAMYCIN) 100 MG capsule Take 1 capsule (100 mg total) by mouth 2 (two) times daily. 03/03/21  Yes Isla Pence, MD  DULoxetine (CYMBALTA) 60 MG capsule Take 60 mg by mouth every morning. 06/17/19  Yes [provider]  hydrALAZINE (APRESOLINE) 50 MG tablet Take 1 tablet (50 mg total) by mouth 2 (two) times daily. 11/10/20  Yes BranchAlphonse Guild, MD  levothyroxine (SYNTHROID) 50 MCG tablet Take 50 mcg by mouth daily before breakfast.  06/01/16  Yes [provider]  losartan (COZAAR) 50 MG tablet Take 50 mg by mouth daily. 02/24/21  Yes [provider]  nebivolol (BYSTOLIC) 10 MG tablet Take 1 tablet (10 mg total) by mouth at bedtime. 06/23/19  Yes Roxan Hockey, MD  omeprazole (PRILOSEC) 20 MG capsule Take 20 mg by mouth every morning. 01/26/21  Yes [provider]  pantoprazole (PROTONIX) 40 MG tablet Take 1 tablet (40 mg total) by mouth daily. 07/27/20  Yes Branch, Alphonse Guild, MD  predniSONE (STERAPRED UNI-PAK 21 TAB) 10 MG (21) TBPK tablet Take by mouth daily. Take 6 tabs by mouth daily  for 2 days, then 5 tabs for 2 days, then 4 tabs for 2 days, then 3 tabs for 2 days, 2 tabs for 2 days, then 1 tab by mouth daily for 2 days 03/03/21  Yes Isla Pence, MD  traMADol (ULTRAM) 50 MG tablet Take 50 mg by mouth as needed. 01/30/21  Yes [provider]  ciprofloxacin (CIPRO) 250 MG tablet Take 250 mg by mouth 2 (two) times daily. Patient not taking: No sig reported 11/09/20   [provider]    Allergies    Sulfamethoxazole  Review of Systems   Review of Systems  Respiratory:  Positive for cough, shortness of breath and wheezing.   All other systems reviewed and are negative.  Physical Exam Updated Vital Signs BP (!) 145/76   Pulse 71   Temp 98 F (36.7 C) (Oral)   Resp 15   Ht 5' (1.524 m)   Wt 91.2  kg   SpO2 98%   BMI 39.26 kg/m   Physical Exam Vitals and nursing [name] reviewed.  Constitutional:      Appearance: Normal appearance.  HENT:     Head: Normocephalic and atraumatic.     Right Ear: External ear normal.     Left Ear: External ear normal.     Nose: Nose normal.     Mouth/Throat:     Mouth: Mucous membranes are moist.     Pharynx: Oropharynx is clear.  Eyes:  Extraocular Movements: Extraocular movements intact.     Conjunctiva/sclera: Conjunctivae normal.     Pupils: Pupils are equal, round, and reactive to light.  Cardiovascular:     Rate and Rhythm: Normal rate and regular rhythm.     Pulses: Normal pulses.     Heart sounds: Normal heart sounds.  Pulmonary:     Breath sounds: Wheezing present.  Abdominal:     General: Abdomen is flat. Bowel sounds are normal.     Palpations: Abdomen is soft.  Musculoskeletal:        General: Normal range of motion.     Cervical back: Normal range of motion and neck supple.  Skin:    General: Skin is warm.     Capillary Refill: Capillary refill takes less than 2 seconds.  Neurological:     General: No focal deficit present.     Mental Status: She is alert and oriented to person, place, and time.  Psychiatric:        Mood and Affect: Mood normal.        Behavior: Behavior normal.    ED Results / Procedures / Treatments   Labs (all labs ordered are listed, but only abnormal results are displayed) Labs Reviewed  BASIC METABOLIC PANEL - Abnormal; Notable for the following components:      Result Value   Sodium 129 (*)    Chloride 96 (*)    Glucose, Bld 106 (*)    Calcium 8.8 (*)    All other components within normal limits  CBC WITH DIFFERENTIAL/PLATELET - Abnormal; Notable for the following components:   Hemoglobin 11.0 (*)    HCT 32.9 (*)    All other components within normal limits  RESP PANEL BY RT-PCR (FLU A&B, COVID) [id number]    EKG EKG Interpretation  Date/Time:  Friday March 03 2021 13:39:40  EDT Ventricular Rate:  73 PR Interval:  181 QRS Duration: 127 QT Interval:  435 QTC Calculation: 480 R Axis:   -67 Text Interpretation: Sinus rhythm RBBB and LAFB Left ventricular hypertrophy Anterior Q waves, possibly due to LVH ST elevation, consider inferior injury No significant change since last tracing Confirmed by Isla Pence [phone]) on 03/03/2021 2:03:29 PM  Radiology DG Chest 2 View  Result Date: 03/03/2021 CLINICAL DATA:  Chest pain and wheezing. EXAM: CHEST - 2 VIEW COMPARISON:  Chest x-ray dated February 01, 2020. FINDINGS: The heart size and mediastinal contours are within normal limits. Both lungs are clear. The visualized skeletal structures are unremarkable. IMPRESSION: No active cardiopulmonary disease. Electronically Signed   By: Titus Dubin M.D.   On: 03/03/2021 14:23    Procedures Procedures   Medications Ordered in ED Medications  albuterol (VENTOLIN HFA) 108 (90 Base) MCG/ACT inhaler 2 puff (2 puffs Inhalation Given 03/03/21 1500)  methylPREDNISolone sodium succinate (SOLU-MEDROL) 125 mg/2 mL injection 125 mg (125 mg Intramuscular Given 03/03/21 1518)    ED Course  I have reviewed the triage vital signs and the nursing notes.  Pertinent labs & imaging results that were available during my care of the patient were reviewed by me and considered in my medical decision making (see chart for details).    MDM Rules/Calculators/A&P                           Pt is feeling much better after meds.  Pt's covid pending.  She will be ambulated with pulse ox.     Final Clinical Impression(s) /  ED Diagnoses Final diagnoses:  Acute bronchitis, unspecified organism    Rx / DC Orders ED Discharge Orders          Ordered    doxycycline (VIBRAMYCIN) 100 MG capsule  2 times daily        03/03/21 1531    predniSONE (STERAPRED UNI-PAK 21 TAB) 10 MG (21) TBPK tablet  Daily        03/03/21 1531             Isla Pence, MD 03/03/21 1538

## 2021-03-03 NOTE — Discharge Instructions (Addendum)
We saw you in the ER for your breathing related complains. We gave you some breathing treatments in the ER, and seems like your symptoms have improved. Covid test is negative. Please take albuterol as needed every 4 hours. Please take the medications prescribed. Please refrain from smoking or smoke exposure. Please see a primary care doctor in 1 week. Return to the ER if your symptoms worsen.

## 2021-03-03 NOTE — ED Notes (Addendum)
Checked pt's O2 while ambulating. O2 was at the lowest at 95 and highest at 99. Pt sis very well while ambulating. Room [employer]

## 2021-03-06 ENCOUNTER — Telehealth: Payer: Self-pay | Admitting: Cardiology

## 2021-03-06 NOTE — Telephone Encounter (Signed)
Reports last night BP 196/86 since starting prednisone BP this morning 140/81 HR 63 Medications reviewed Advised to monitor BP daily, same time, same cuff, same time and call office in one week with readings Verbalized understanding of plan

## 2021-03-06 NOTE — Telephone Encounter (Signed)
Patient is wanting to know if she can increase her Losartan to 100mg  daily.  Was seen in the ER.

## 2021-03-07 NOTE — Telephone Encounter (Signed)
Prednisone very commonly can cause high blood pressures, bp's would normalize once she has completed course. Can update [place] on bp's at the end of the week, but essentially may be elevated at times until predinsone is complete   Zandra Abts MD

## 2021-03-13 ENCOUNTER — Ambulatory Visit
Admission: EM | Admit: 2021-03-13 | Discharge: 2021-03-13 | Disposition: A | Discharge status: Home / Self Care | Payer: PPO | Attending: Urgent Care | Admitting: Urgent Care

## 2021-03-13 ENCOUNTER — Ambulatory Visit (INDEPENDENT_AMBULATORY_CARE_PROVIDER_SITE_OTHER): Payer: PPO

## 2021-03-13 ENCOUNTER — Telehealth: Payer: Self-pay | Admitting: Cardiology

## 2021-03-13 ENCOUNTER — Other Ambulatory Visit: Payer: Self-pay

## 2021-03-13 DIAGNOSIS — Z79899 Other long term (current) drug therapy: Secondary | ICD-10-CM

## 2021-03-13 DIAGNOSIS — R0602 Shortness of breath: Secondary | ICD-10-CM | POA: Diagnosis not present

## 2021-03-13 DIAGNOSIS — R062 Wheezing: Secondary | ICD-10-CM

## 2021-03-13 DIAGNOSIS — R0789 Other chest pain: Secondary | ICD-10-CM

## 2021-03-13 DIAGNOSIS — J988 Other specified respiratory disorders: Secondary | ICD-10-CM | POA: Diagnosis not present

## 2021-03-13 DIAGNOSIS — B9789 Other viral agents as the cause of diseases classified elsewhere: Secondary | ICD-10-CM | POA: Diagnosis not present

## 2021-03-13 NOTE — Telephone Encounter (Signed)
Patient called with BP readings  155/72  P 77 03/08/2021  149/76  P 70 03/09/2021  158/74  P 76 03/10/2021  158/82  P 79 03/11/2021  161/79  P 73 03/12/2021  142/65  P 77 03/13/2021

## 2021-03-13 NOTE — ED Triage Notes (Signed)
Pt reports ongoing episodes of wheezing, cough as well as episodes of feeling cold and clammy.  Reports having difficulty getting air in. Reports generalized weakness.  Was seen in ED on 09/30 for same s/s.  States the CP she reported was the tightness with breathing.

## 2021-03-13 NOTE — Telephone Encounter (Signed)
Pt still has two days left of Prednisone to take.

## 2021-03-13 NOTE — Telephone Encounter (Signed)
Bp's still too high, if she has been off prednisone and these are the numbers would increase her hydralazine to 100mg  bid. Had some low sodiums in ER, can she get another bmet in 1 week, sometimes losartan can lower sodium so would not increase that dose  Zandra Abts MD

## 2021-03-13 NOTE — ED Provider Notes (Signed)
[place]   MRN: [id number] DOB: [birth year]  Subjective:   [name] is a 75 y.o. female presenting for 1 month history of persistent malaise, fatigue, chest tightness and chest pressure.  Has undergone 2 rounds of steroids, azithromycin and lastly doxycycline.  Results are as below for previous visits.  Last had COVID-19 about 2 years ago.  She does have a history of OSA and is on CPAP.  Patient is not a smoker.  No current facility-administered medications for this encounter.  Current Outpatient Medications:    albuterol (VENTOLIN HFA) 108 (90 Base) MCG/ACT inhaler, Inhale 2 puffs into the lungs every 4 (four) hours as needed., Disp: , Rfl:    ALPRAZolam (XANAX) 0.25 MG tablet, Take 0.25 mg by mouth at bedtime as needed for anxiety., Disp: , Rfl:    amLODipine (NORVASC) 2.5 MG tablet, Take 2.5 mg by mouth daily., Disp: , Rfl:    aspirin 325 MG tablet, Take 1 tablet (325 mg total) by mouth daily with breakfast., Disp: 30 tablet, Rfl: 3   atorvastatin (LIPITOR) 80 MG tablet, Take 80 mg by mouth daily., Disp: , Rfl:    denosumab (PROLIA) 60 MG/ML SOSY injection, Inject 60 mg into the skin every 6 (six) months., Disp: , Rfl:    diclofenac (VOLTAREN) 75 MG EC tablet, Take 75 mg by mouth 2 (two) times daily as needed., Disp: , Rfl:    donepezil (ARICEPT) 5 MG tablet, Take 5 mg by mouth daily., Disp: , Rfl:    doxycycline (VIBRAMYCIN) 100 MG capsule, Take 1 capsule (100 mg total) by mouth 2 (two) times daily., Disp: 14 capsule, Rfl: 0   DULoxetine (CYMBALTA) 60 MG capsule, Take 60 mg by mouth every morning., Disp: , Rfl:    hydrALAZINE (APRESOLINE) 50 MG tablet, Take 1 tablet (50 mg total) by mouth 2 (two) times daily., Disp: 180 tablet, Rfl: 1   levothyroxine (SYNTHROID) 50 MCG tablet, Take 50 mcg by mouth daily before breakfast. , Disp: , Rfl:    losartan (COZAAR) 50 MG tablet, Take 50 mg by mouth daily., Disp: , Rfl:    nebivolol (BYSTOLIC) 10 MG tablet, Take 1  tablet (10 mg total) by mouth at bedtime., Disp: 30 tablet, Rfl: 3   omeprazole (PRILOSEC) 20 MG capsule, Take 20 mg by mouth every morning., Disp: , Rfl:    pantoprazole (PROTONIX) 40 MG tablet, Take 1 tablet (40 mg total) by mouth daily., Disp: 90 tablet, Rfl: 1   predniSONE (STERAPRED UNI-PAK 21 TAB) 10 MG (21) TBPK tablet, Take by mouth daily. Take 6 tabs by mouth daily  for 2 days, then 5 tabs for 2 days, then 4 tabs for 2 days, then 3 tabs for 2 days, 2 tabs for 2 days, then 1 tab by mouth daily for 2 days, Disp: 42 tablet, Rfl: 0   traMADol (ULTRAM) 50 MG tablet, Take 50 mg by mouth as needed., Disp: , Rfl:    Allergies  Allergen Reactions   Sulfamethoxazole Swelling    Lips swell and feel numb    Past Medical History:  Diagnosis Date   Anemia    Anxiety    Arthritis    Complication of anesthesia    "pt. holds her breath when waking up"   Depression    GERD (gastroesophageal reflux disease)    Hypercholesteremia    Hypertension    Hypothyroidism    Osteoporosis    PONV (postoperative nausea and vomiting)    Staph infection  abdomen and returned 5 yrs. later     Past Surgical History:  Procedure Laterality Date   ABDOMINAL HYSTERECTOMY     CATARACT EXTRACTION W/PHACO Right 01/22/2019   Procedure: CATARACT EXTRACTION PHACO AND INTRAOCULAR LENS PLACEMENT ([facility]);  Surgeon: [name], MD;  Location: [facility];  Service: Ophthalmology;  Laterality: Right;  right, CDE: 19.63   CATARACT EXTRACTION W/PHACO Left 02/16/2019   Procedure: CATARACT EXTRACTION PHACO AND INTRAOCULAR LENS PLACEMENT LEFT EYE (CDE: 14.36);  Surgeon: [name], MD;  Location: [facility];  Service: Ophthalmology;  Laterality: Left;   CERVICAL FUSION  1990's   COLON SURGERY     FOOT SURGERY Left    reshaped   HERNIA REPAIR     HIP ARTHROPLASTY Left    JOINT REPLACEMENT Right    hip   LUMBAR SPINE SURGERY     REPLACEMENT TOTAL KNEE Right    THYROIDECTOMY, PARTIAL     TOTAL KNEE ARTHROPLASTY Left  08/21/2016   Procedure: LEFT TOTAL KNEE ARTHROPLASTY;  Surgeon: Renette Butters, MD;  Location: [facility];  Service: Orthopedics;  Laterality: Left;    No family history on file.  Social History   Tobacco Use   Smoking status: Never   Smokeless tobacco: Never  Vaping Use   Vaping Use: Never used  Substance Use Topics   Alcohol use: No   Drug use: No    ROS   Objective:   Vitals: BP (!) 141/93 (BP Location: Right Arm)   Pulse 75   Temp 98.5 F (36.9 C) (Oral)   Resp 14   SpO2 93%   Physical Exam Constitutional:      General: She is not in acute distress.    Appearance: Normal appearance. She is well-developed. She is obese. She is not ill-appearing, toxic-appearing or diaphoretic.  HENT:     Head: Normocephalic and atraumatic.     Nose: Nose normal.     Mouth/Throat:     Mouth: Mucous membranes are moist.     Pharynx: Oropharynx is clear.  Eyes:     General: No scleral icterus.       Right eye: No discharge.        Left eye: No discharge.     Extraocular Movements: Extraocular movements intact.     Conjunctiva/sclera: Conjunctivae normal.     Pupils: Pupils are equal, round, and reactive to light.  Cardiovascular:     Rate and Rhythm: Normal rate and regular rhythm.     Pulses: Normal pulses.     Heart sounds: Normal heart sounds. No murmur heard.   No friction rub. No gallop.  Pulmonary:     Effort: Pulmonary effort is normal. No respiratory distress.     Breath sounds: Normal breath sounds. No stridor. No wheezing, rhonchi or rales.  Chest:     Chest wall: No tenderness.  Skin:    General: Skin is warm and dry.     Findings: No rash.  Neurological:     General: No focal deficit present.     Mental Status: She is alert and oriented to person, place, and time.  Psychiatric:        Mood and Affect: Mood normal.        Behavior: Behavior normal.        Thought Content: Thought content normal.        Judgment: Judgment normal.    DG Chest 2  View  Result Date: 03/13/2021 CLINICAL DATA:  Shortness of breath EXAM: CHEST -  2 VIEW COMPARISON:  03/03/2021 FINDINGS: The heart size and mediastinal contours are within normal limits. Both lungs are clear. Disc degenerative disease of the thoracic spine with exaggerated kyphosis. IMPRESSION: No acute abnormality of the lungs. Electronically Signed   By: Delanna Ahmadi M.D.   On: 03/13/2021 15:32    Recent Results (from the past 2160 hour(s))  Basic metabolic panel     Status: Abnormal   Collection Time: 03/03/21  1:46 PM  Result Value Ref Range   Sodium 129 (L) 135 - 145 mmol/L   Potassium 3.9 3.5 - 5.1 mmol/L   Chloride 96 (L) 98 - 111 mmol/L   CO2 25 22 - 32 mmol/L   Glucose, Bld 106 (H) 70 - 99 mg/dL    Comment: Glucose reference range applies only to samples taken after fasting for at least 8 hours.   BUN 17 8 - 23 mg/dL   Creatinine, Ser 0.71 0.44 - 1.00 mg/dL   Calcium 8.8 (L) 8.9 - 10.3 mg/dL   GFR, Estimated >60 >60 mL/min    Comment: (NOTE) Calculated using the CKD-EPI Creatinine Equation (2021)    Anion gap 8 5 - 15    Comment: Performed at [facility], [address], [place], [place] [address]  CBC with Differential     Status: Abnormal   Collection Time: 03/03/21  1:46 PM  Result Value Ref Range   WBC 4.5 4.0 - 10.5 K/uL   RBC 3.93 3.87 - 5.11 MIL/uL   Hemoglobin 11.0 (L) 12.0 - 15.0 g/dL   HCT 32.9 (L) 36.0 - 46.0 %   MCV 83.7 80.0 - 100.0 fL   MCH 28.0 26.0 - 34.0 pg   MCHC 33.4 30.0 - 36.0 g/dL   RDW 14.8 11.5 - 15.5 %   Platelets 260 150 - 400 K/uL   nRBC 0.0 0.0 - 0.2 %   Neutrophils Relative % 61 %   Neutro Abs 2.7 1.7 - 7.7 K/uL   Lymphocytes Relative 21 %   Lymphs Abs 1.0 0.7 - 4.0 K/uL   Monocytes Relative 11 %   Monocytes Absolute 0.5 0.1 - 1.0 K/uL   Eosinophils Relative 6 %   Eosinophils Absolute 0.3 0.0 - 0.5 K/uL   Basophils Relative 1 %   Basophils Absolute 0.0 0.0 - 0.1 K/uL   Immature Granulocytes 0 %   Abs Immature Granulocytes 0.01  0.00 - 0.07 K/uL    Comment: Performed at [facility], [address] [place], [place] [address]  Resp Panel by RT-PCR (Flu A&B, Covid) Nasopharyngeal Swab     Status: None   Collection Time: 03/03/21  3:00 PM   Specimen: Nasopharyngeal Swab; Nasopharyngeal(NP) swabs in vial transport medium  Result Value Ref Range   SARS Coronavirus 2 by RT PCR NEGATIVE NEGATIVE    Comment: (NOTE) SARS-CoV-2 target nucleic acids are NOT DETECTED.  The SARS-CoV-2 RNA is generally detectable in upper respiratory specimens during the acute phase of infection. The lowest concentration of SARS-CoV-2 viral copies this assay can detect is 138 copies/mL. A negative result does not preclude SARS-Cov-2 infection and should not be used as the sole basis for treatment or other patient management decisions. A negative result may occur with  improper specimen collection/handling, submission of specimen other than nasopharyngeal swab, presence of viral mutation(s) within the areas targeted by this assay, and inadequate number of viral copies(<138 copies/mL). A negative result must be combined with clinical observations, patient history, and epidemiological information. The expected result is Negative.  Fact Sheet for Patients:  EntrepreneurPulse.com.au  Fact Sheet for Healthcare Providers:  IncredibleEmployment.be  This test is no t yet approved or cleared by the [place] FDA and  has been authorized for detection and/or diagnosis of SARS-CoV-2 by FDA under an Emergency Use Authorization (EUA). This EUA will remain  in effect (meaning this test can be used) for the duration of the COVID-19 declaration under Section 564(b)(1) of the Act, 21 U.S.C.section 360bbb-3(b)(1), unless the authorization is terminated  or revoked sooner.       Influenza A by PCR NEGATIVE NEGATIVE   Influenza B by PCR NEGATIVE NEGATIVE    Comment: (NOTE) The Xpert Xpress SARS-CoV-2/FLU/RSV  plus assay is intended as an aid in the diagnosis of influenza from Nasopharyngeal swab specimens and should not be used as a sole basis for treatment. Nasal washings and aspirates are unacceptable for Xpert Xpress SARS-CoV-2/FLU/RSV testing.  Fact Sheet for Patients: EntrepreneurPulse.com.au  Fact Sheet for Healthcare Providers: IncredibleEmployment.be  This test is not yet approved or cleared by the [place] FDA and has been authorized for detection and/or diagnosis of SARS-CoV-2 by FDA under an Emergency Use Authorization (EUA). This EUA will remain in effect (meaning this test can be used) for the duration of the COVID-19 declaration under Section 564(b)(1) of the Act, 21 U.S.C. section 360bbb-3(b)(1), unless the authorization is terminated or revoked.  Performed at [facility], [address] [place], [place] [address]      Assessment and Plan :   PDMP not reviewed this encounter.  1. Viral respiratory illness   2. Chest tightness   3. Wheezing     Suspect the patient is still recovering from a viral respiratory illness.  She has low risk factors, low Wells criteria score for pulmonary embolism but recommended that she discuss this with her PCP or present to the emergency room for chest CT scan as she has not had 2 negative chest x-rays.  We will not use more antibiotics and more steroids than she is already gotten in the past month.  Recommended continued supportive care.  Counseled patient on potential for adverse effects with medications prescribed/recommended today, ER and return-to-clinic precautions discussed, patient verbalized understanding.    Jaynee Eagles, [place] 03/13/21 [id number]

## 2021-03-15 NOTE — Telephone Encounter (Signed)
Have her update [place] on bp's late next week then, would need to see how bp's look off prednisone  Zandra Abts MD

## 2021-03-15 NOTE — Telephone Encounter (Signed)
Pt verbalized understanding and will update our office late next week of bp without being on prednisone.

## 2021-03-17 ENCOUNTER — Other Ambulatory Visit: Payer: Self-pay

## 2021-03-17 ENCOUNTER — Ambulatory Visit (INDEPENDENT_AMBULATORY_CARE_PROVIDER_SITE_OTHER): Payer: PPO | Admitting: Cardiology

## 2021-03-17 ENCOUNTER — Encounter: Payer: Self-pay | Admitting: Cardiology

## 2021-03-17 VITALS — BP 140/70 | HR 72 | Ht 60.0 in | Wt 216.0 lb

## 2021-03-17 DIAGNOSIS — I10 Essential (primary) hypertension: Secondary | ICD-10-CM

## 2021-03-17 NOTE — Patient Instructions (Signed)
Medication Instructions:  Your physician recommends that you continue on your current medications as directed. Please refer to the Current Medication list given to you today.  *If you need a refill on your cardiac medications before your next appointment, please call your pharmacy*   Lab Work: None If you have labs (blood work) drawn today and your tests are completely normal, you will receive your results only by: [place] (if you have MyChart) OR A paper copy in the mail If you have any lab test that is abnormal or we need to change your treatment, we will call you to review the results.   Testing/Procedures: NOne   Follow-Up: At [facility], you and your health needs are our priority.  As part of our continuing mission to provide you with exceptional heart care, we have created designated Provider Care Teams.  These Care Teams include your primary Cardiologist (physician) and Advanced Practice Providers (APPs -  Physician Assistants and Nurse Practitioners) who all work together to provide you with the care you need, when you need it.  We recommend signing up for the patient portal called "MyChart".  Sign up information is provided on this After Visit Summary.  MyChart is used to connect with patients for Virtual Visits (Telemedicine).  Patients are able to view lab/test results, encounter notes, upcoming appointments, etc.  Non-urgent messages can be sent to your provider as well.   To learn more about what you can do with MyChart, go to [email or web address].    Your next appointment:   3 month(s)  The format for your next appointment:   In Person  Provider:   Carlyle Dolly, MD   Other Instructions Keep blood pressure log for one (1) week and update our office.

## 2021-03-17 NOTE — Progress Notes (Signed)
Virtual Visit via Telephone Note   This visit type was conducted due to national recommendations for restrictions regarding the COVID-19 Pandemic (e.g. social distancing) in an effort to limit this patient's exposure and mitigate transmission in our community.  Due to her co-morbid illnesses, this patient is at least at moderate risk for complications without adequate follow up.  This format is felt to be most appropriate for this patient at this time.  The patient did not have access to video technology/had technical difficulties with video requiring transitioning to audio format only (telephone).  All issues noted in this document were discussed and addressed.  No physical exam could be performed with this format.  Please refer to the patient's chart for her  consent to telehealth for [facility].    Date:  03/17/2021   ID:  [name], DOB 1945-10-18, MRN [id number] The patient was identified using 2 identifiers.  Patient Location: Home Provider Location: Office/Clinic   PCP:  [name], MD   [facility] Providers Cardiologist:  Carlyle Dolly, MD     Evaluation Performed:  Follow-Up Visit  Chief Complaint:  Hypertension  History of Present Illness:    [name] is a 75 y.o. female seen today for a focused visit for recent issues with HTN.    1. HTN - off HCTZ due to low Na during Jan 2021 admission for TIA. Norvasc 10mg  was also stopped at that time to allower permissive HTN, eventually restarted at lower dose of 5mg  daily   - pcp lowered norvasc to 2.5mg  daily. Appears today losartan is also lowered to 25mg  daily, unclear history of this.    - last day of prednisone Wednesday - home home bp's 140/70. Was higher on prednisone.      The patient does not have symptoms concerning for COVID-19 infection (fever, chills, cough, or new shortness of breath).    Past Medical History:  Diagnosis Date   Anemia    Anxiety    Arthritis    Complication of  anesthesia    "pt. holds her breath when waking up"   Depression    GERD (gastroesophageal reflux disease)    Hypercholesteremia    Hypertension    Hypothyroidism    Osteoporosis    PONV (postoperative nausea and vomiting)    Staph infection    abdomen and returned 5 yrs. later   Past Surgical History:  Procedure Laterality Date   ABDOMINAL HYSTERECTOMY     CATARACT EXTRACTION W/PHACO Right 01/22/2019   Procedure: CATARACT EXTRACTION PHACO AND INTRAOCULAR LENS PLACEMENT (IOC);  Surgeon: [name], MD;  [facility]: [facility];  Service: Ophthalmology;  Laterality: Right;  right, CDE: 19.63   CATARACT EXTRACTION W/PHACO Left 02/16/2019   Procedure: CATARACT EXTRACTION PHACO AND INTRAOCULAR LENS PLACEMENT LEFT EYE (CDE: 14.36);  Surgeon: [name], MD;  Location: [facility];  Service: Ophthalmology;  Laterality: Left;   CERVICAL FUSION  1990's   COLON SURGERY     FOOT SURGERY Left    reshaped   HERNIA REPAIR     HIP ARTHROPLASTY Left    JOINT REPLACEMENT Right    hip   LUMBAR SPINE SURGERY     REPLACEMENT TOTAL KNEE Right    THYROIDECTOMY, PARTIAL     TOTAL KNEE ARTHROPLASTY Left 08/21/2016   Procedure: LEFT TOTAL KNEE ARTHROPLASTY;  Surgeon: Renette Butters, MD;  Location: [place];  Service: Orthopedics;  Laterality: Left;     No outpatient medications have been marked as  taking for the 03/17/21 encounter (Office Visit) with Arnoldo Lenis, MD.     Allergies:   Sulfamethoxazole   Social History   Tobacco Use   Smoking status: Never   Smokeless tobacco: Never  Vaping Use   Vaping Use: Never used  Substance Use Topics   Alcohol use: No   Drug use: No     Family Hx: The patient's family history is not on file.  ROS:   Please see the history of present illness.     All other systems reviewed and are negative.   Prior CV studies:   The following studies were reviewed today:  03/2017 echo Study Conclusions   - Left ventricle: The cavity size was normal.  Systolic function was   normal. The estimated ejection fraction was in the range of 55%   to 60%. Wall motion was normal; there were no regional wall   motion abnormalities. Doppler parameters are consistent with   abnormal left ventricular relaxation (grade 1 diastolic   dysfunction). - Mitral valve: There was moderate regurgitation. - Atrial septum: No defect or patent foramen ovale was identified. - Tricuspid valve: There was mild regurgitation. - Pulmonic valve: There was mild regurgitation. - Pulmonary arteries: PA peak pressure: 41 mm Hg (S). - Systemic veins: The IVC was suboptimally visualized, but appeared   dilated. There was normal respiratory variation. Estimated RAP 8   mmHg.     Jan 2022 7 day zio patch 7 day monitor Min HR 55, Max HR 119, Avg HR 69 Frequent supraventricular ectopy in the form of isolated PACs, couplets, triplets Rare ventricular ectopy in the form of isolated PVCs. Reported symptoms correlated with sinus rhythm with PACs   Labs/Other Tests and Data Reviewed:    EKG:  No ECG reviewed.  Recent Labs: 03/03/2021: BUN 17; Creatinine, Ser 0.71; Hemoglobin 11.0; Platelets 260; Potassium 3.9; Sodium 129   Recent Lipid Panel Lab Results  Component Value Date/Time   CHOL 262 (H) 06/23/2019 03:24 AM   TRIG 38 06/23/2019 03:24 AM   HDL 80 06/23/2019 03:24 AM   CHOLHDL 3.3 06/23/2019 03:24 AM   LDLCALC 174 (H) 06/23/2019 03:24 AM    Wt Readings from Last 3 Encounters:  03/17/21 216 lb (98 kg)  03/03/21 201 lb (91.2 kg)  02/16/21 213 lb (96.6 kg)     Risk Assessment/Calculations:          Objective:    Vital Signs:  BP 140/70   Pulse 72   Ht 5' (1.524 m)   Wt 216 lb (98 kg)   BMI 42.18 kg/m    Normal affect. Normal speech pattern and tone. Comfortable, no apparent disteress. No audible signs of sob or wheezing.   ASSESSMENT & PLAN:    1. HTN -  am not clear on recent changes in her bp meds including the lowering of her losartan to 25mg   daily.  - high bp's at last visit, even high over the last week while on prednisone - will see how bp's settle over the next week, she will call and update [place] Friday - chronic issues with low sodium, repeat labs, if ongoing would try coming off losartan. Could titrate up hydralazine or norvacs if needed.              COVID-19 Education: The signs and symptoms of COVID-19 were discussed with the patient and how to seek care for testing (follow up with PCP or arrange E-visit).  The importance of social distancing  was discussed today.  Time:   Today, I have spent 15 minutes with the patient with telehealth technology discussing the above problems.     Medication Adjustments/Labs and Tests Ordered: Current medicines are reviewed at length with the patient today.  Concerns regarding medicines are outlined above.   Tests Ordered: No orders of the defined types were placed in this encounter.   Medication Changes: No orders of the defined types were placed in this encounter.   Follow Up:  In Person 3 months   Signed, Carlyle Dolly, MD  03/17/2021 12:35 PM    [place]

## 2021-03-20 DIAGNOSIS — G4733 Obstructive sleep apnea (adult) (pediatric): Secondary | ICD-10-CM | POA: Diagnosis not present

## 2021-03-24 ENCOUNTER — Telehealth: Payer: Self-pay | Admitting: Cardiology

## 2021-03-24 NOTE — Telephone Encounter (Signed)
10/15 - 123/59  76 10/16 - 121/68  70 10/17 - 120/61  74 10/18 - 135/75  70 10/19 - 124/64  68 10/20 - 124/71  76 10/21 - 124/67  83  Took her last Prednisone on 03/15/2021.

## 2021-03-24 NOTE — Telephone Encounter (Signed)
Calling to give BP readings   [phone]

## 2021-03-27 DIAGNOSIS — G4733 Obstructive sleep apnea (adult) (pediatric): Secondary | ICD-10-CM | POA: Diagnosis not present

## 2021-03-27 NOTE — Telephone Encounter (Signed)
BP's look good, no changes   J Rilya Longo MD 

## 2021-03-27 NOTE — Telephone Encounter (Signed)
Patient notified and verbalized understanding. 

## 2021-04-03 DIAGNOSIS — M1990 Unspecified osteoarthritis, unspecified site: Secondary | ICD-10-CM | POA: Diagnosis not present

## 2021-04-03 DIAGNOSIS — E782 Mixed hyperlipidemia: Secondary | ICD-10-CM | POA: Diagnosis not present

## 2021-04-03 DIAGNOSIS — I10 Essential (primary) hypertension: Secondary | ICD-10-CM | POA: Diagnosis not present

## 2021-04-07 DIAGNOSIS — E871 Hypo-osmolality and hyponatremia: Secondary | ICD-10-CM | POA: Diagnosis not present

## 2021-04-07 DIAGNOSIS — J069 Acute upper respiratory infection, unspecified: Secondary | ICD-10-CM | POA: Diagnosis not present

## 2021-04-20 DIAGNOSIS — G4733 Obstructive sleep apnea (adult) (pediatric): Secondary | ICD-10-CM | POA: Diagnosis not present

## 2021-05-02 ENCOUNTER — Encounter (HOSPITAL_COMMUNITY)
Admission: RE | Admit: 2021-05-02 | Discharge: 2021-05-02 | Disposition: A | Discharge status: Home / Self Care | Payer: PPO | Source: Ambulatory Visit | Attending: Internal Medicine | Admitting: Internal Medicine

## 2021-05-02 ENCOUNTER — Encounter (HOSPITAL_COMMUNITY): Payer: Self-pay

## 2021-05-02 ENCOUNTER — Other Ambulatory Visit: Payer: Self-pay

## 2021-05-02 DIAGNOSIS — Z1211 Encounter for screening for malignant neoplasm of colon: Secondary | ICD-10-CM | POA: Diagnosis not present

## 2021-05-02 DIAGNOSIS — T50905A Adverse effect of unspecified drugs, medicaments and biological substances, initial encounter: Secondary | ICD-10-CM | POA: Insufficient documentation

## 2021-05-02 DIAGNOSIS — K30 Functional dyspepsia: Secondary | ICD-10-CM | POA: Diagnosis not present

## 2021-05-02 LAB — COLOGUARD

## 2021-05-02 MED ORDER — DENOSUMAB 60 MG/ML ~~LOC~~ SOSY
60.0000 mg | PREFILLED_SYRINGE | Freq: Once | SUBCUTANEOUS | Status: AC
  Administered 2021-05-02: 60 mg via SUBCUTANEOUS

## 2021-05-03 DIAGNOSIS — M1990 Unspecified osteoarthritis, unspecified site: Secondary | ICD-10-CM | POA: Diagnosis not present

## 2021-05-03 DIAGNOSIS — E782 Mixed hyperlipidemia: Secondary | ICD-10-CM | POA: Diagnosis not present

## 2021-05-03 DIAGNOSIS — I10 Essential (primary) hypertension: Secondary | ICD-10-CM | POA: Diagnosis not present

## 2021-05-08 DIAGNOSIS — G4733 Obstructive sleep apnea (adult) (pediatric): Secondary | ICD-10-CM | POA: Diagnosis not present

## 2021-05-08 LAB — COLOGUARD: COLOGUARD: POSITIVE — AB

## 2021-05-10 DIAGNOSIS — N390 Urinary tract infection, site not specified: Secondary | ICD-10-CM | POA: Diagnosis not present

## 2021-05-10 DIAGNOSIS — R300 Dysuria: Secondary | ICD-10-CM | POA: Diagnosis not present

## 2021-05-16 ENCOUNTER — Encounter: Payer: Self-pay | Admitting: Internal Medicine

## 2021-05-20 DIAGNOSIS — G4733 Obstructive sleep apnea (adult) (pediatric): Secondary | ICD-10-CM | POA: Diagnosis not present

## 2021-05-24 ENCOUNTER — Encounter: Payer: PPO | Attending: Family Medicine | Admitting: Nutrition

## 2021-05-24 ENCOUNTER — Other Ambulatory Visit: Payer: Self-pay

## 2021-05-24 DIAGNOSIS — G459 Transient cerebral ischemic attack, unspecified: Secondary | ICD-10-CM | POA: Insufficient documentation

## 2021-05-24 DIAGNOSIS — E782 Mixed hyperlipidemia: Secondary | ICD-10-CM | POA: Insufficient documentation

## 2021-05-24 DIAGNOSIS — I10 Essential (primary) hypertension: Secondary | ICD-10-CM | POA: Insufficient documentation

## 2021-05-24 DIAGNOSIS — Z6841 Body Mass Index (BMI) 40.0 and over, adult: Secondary | ICD-10-CM | POA: Insufficient documentation

## 2021-05-24 NOTE — Progress Notes (Signed)
Medical Nutrition Therapy  Appointment Start time:  1430  Appointment End time:  0867  Primary concerns today: Obesity and Hyperlipidemia Referral diagnosis: E66.01, e78.0 Preferred learning style: Read and hands on  Learning readiness: Ready  NUTRITION ASSESSMENT  She notes she wants to lose weight. Has tried many diets in the past and none of them worked for her. 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 Very limited mobility due to knee issues. Hyperlipidemia and obesity   Anthropometrics  Wt Readings from Last 3 Encounters:  05/02/21 216 lb 0.8 oz (98 kg)  03/17/21 216 lb (98 kg)  03/03/21 201 lb (91.2 kg)   Ht Readings from Last 3 Encounters:  05/02/21 5' (1.524 m)  03/17/21 5' (1.524 m)  03/03/21 5' (1.524 m)   There is no height or weight on file to calculate BMI. @BMIFA @ Facility age limit for growth percentiles is 20 years. Facility age limit for growth percentiles is 20 years.   Clinical Medical Hx: see chart Medications: see chart Labs:  Lab Results  Component Value Date   HGBA1C 5.5 06/23/2019   CMP Latest Ref Rng & Units 03/03/2021 02/01/2020 06/22/2019  Glucose 70 - 99 mg/dL 106(H) 93 105(H)  BUN 8 - 23 mg/dL 17 12 13   Creatinine 0.44 - 1.00 mg/dL 0.71 0.65 1.00  Sodium 135 - 145 mmol/L 129(L) 131(L) 124(L)  Potassium 3.5 - 5.1 mmol/L 3.9 4.1 3.6  Chloride 98 - 111 mmol/L 96(L) 100 88(L)  CO2 22 - 32 mmol/L 25 19(L) -  Calcium 8.9 - 10.3 mg/dL 8.8(L) 9.2 -  Total Protein 6.5 - 8.1 g/dL - 6.8 -  Total Bilirubin 0.3 - 1.2 mg/dL - 0.5 -  Alkaline Phos 38 - 126 U/L - 41 -  AST 15 - 41 U/L - 25 -  ALT 0 - 44 U/L - 19 -   Lipid Panel     Component Value Date/Time   CHOL 262 (H) 06/23/2019 0324   TRIG 38 06/23/2019 0324   HDL 80 06/23/2019 0324   CHOLHDL 3.3 06/23/2019 0324   VLDL 8 06/23/2019 0324   LDLCALC 174 (H) 06/23/2019 0324    Notable Signs/Symptoms: Fatigue, no energy, body aches and pains. Limited ability to walk  Lifestyle & Dietary Hx Lives  with her daughter. Can't stand for very long. Her son in law and daughter cook mostly. She usually eats whatever they cook and it's usually not healthy foods. Drinks pediatlye. Wears depends at night.  Estimated daily fluid intake: 36 oz Supplements:  Sleep: not good Stress / self-care: her health and weight Current average weekly physical activity: adl  24-Hr Dietary Recall First Meal: 1 debbie oatmeal cookie, Premeir protein Snack: 1/2 pedialyte advanced, diet pepsi Second Meal: ham, cheese, egg omelet smart ones, Gatorade zero Snack:  Third Meal: 8 oz chuck steak,  Snack:  Beverages: Gatorade, diet sodas, water, pedialyte, 3 diet sodas  Estimated Energy Needs Calories: 1200 Carbohydrate: 135g Protein: 90g Fat: 33g   NUTRITION DIAGNOSIS  NI-1.5 Excessive energy intake As related to morbid obesity.  As evidenced by BMI 42 and diet recall of high fat high salt high sugar diet.Marland Kitchen   NUTRITION INTERVENTION  Nutrition education (E-1) on the following topics:  Plant lifestyle nutrition and weight loss using My Plate, CHO counting, meal planning, portion sizes, timing of meals, avoiding snacks between meals taking medications as prescribed, benefits of doing chair exercising 10 minutes per day.   Handouts Provided Include  Plant based focused meal plan Meal  Plan Card Nutrient density of foods     Learning Style & Readiness for Change Teaching method utilized: Visual & Auditory  Demonstrated degree of understanding via: Teach Back  Barriers to learning/adherence to lifestyle change: Mobility  Goals Established by Pt Eat three meals per day Focus on fresh fruits and vegetables. Cut out fast foods, processed foods and snacks. Drink only water Lose 1 lb per week. Eat meals on time. Don't eat past 7 pm. Try doing chair exercises to music  Lifestyle Medicine - Whole Food, Plant Predominant Nutrition is highly recommended: Eat Plenty of vegetables, Mushrooms, fruits,  Legumes, Whole Grains, Nuts, seeds in lieu of processed meats, processed snacks/pastries red meat, poultry, eggs.    -It is better to avoid simple carbohydrates including: Cakes, Sweet Desserts, Ice Cream, Soda (diet and regular), Sweet Tea, Candies, Chips, Cookies, Store Bought Juices, Alcohol in Excess of  1-2 drinks a day, Lemonade,  Artificial Sweeteners, Doughnuts, Coffee Creamers, "Sugar-free" Products, etc, etc.  This is not a complete list.....  Exercise: If you are able: 30 -60 minutes a day ,4 days a week, or 150 minutes a week.  The longer the better.  Combine stretch, strength, and aerobic activities.  If you were told in the past that you have high risk for cardiovascular diseases, you may seek evaluation by your heart doctor prior to initiating moderate to intense exercise programs.  MONITORING & EVALUATION Dietary intake, weekly physical activity, and weight  in 1 month.  Next Steps  Patient is to work on meal planning and cutting out snacks, sweets and junk food.Marland Kitchen

## 2021-06-02 DIAGNOSIS — E782 Mixed hyperlipidemia: Secondary | ICD-10-CM | POA: Diagnosis not present

## 2021-06-02 DIAGNOSIS — M1990 Unspecified osteoarthritis, unspecified site: Secondary | ICD-10-CM | POA: Diagnosis not present

## 2021-06-02 DIAGNOSIS — I10 Essential (primary) hypertension: Secondary | ICD-10-CM | POA: Diagnosis not present

## 2021-06-04 DEATH — deceased

## 2021-06-06 DIAGNOSIS — M9905 Segmental and somatic dysfunction of pelvic region: Secondary | ICD-10-CM | POA: Diagnosis not present

## 2021-06-06 DIAGNOSIS — M9903 Segmental and somatic dysfunction of lumbar region: Secondary | ICD-10-CM | POA: Diagnosis not present

## 2021-06-06 DIAGNOSIS — M9902 Segmental and somatic dysfunction of thoracic region: Secondary | ICD-10-CM | POA: Diagnosis not present

## 2021-06-06 DIAGNOSIS — M546 Pain in thoracic spine: Secondary | ICD-10-CM | POA: Diagnosis not present

## 2021-06-06 DIAGNOSIS — M62830 Muscle spasm of back: Secondary | ICD-10-CM | POA: Diagnosis not present

## 2021-06-07 DIAGNOSIS — G4733 Obstructive sleep apnea (adult) (pediatric): Secondary | ICD-10-CM | POA: Diagnosis not present

## 2021-06-08 DIAGNOSIS — G4733 Obstructive sleep apnea (adult) (pediatric): Secondary | ICD-10-CM | POA: Diagnosis not present

## 2021-06-09 DIAGNOSIS — M546 Pain in thoracic spine: Secondary | ICD-10-CM | POA: Diagnosis not present

## 2021-06-09 DIAGNOSIS — M9905 Segmental and somatic dysfunction of pelvic region: Secondary | ICD-10-CM | POA: Diagnosis not present

## 2021-06-09 DIAGNOSIS — M62830 Muscle spasm of back: Secondary | ICD-10-CM | POA: Diagnosis not present

## 2021-06-09 DIAGNOSIS — M9902 Segmental and somatic dysfunction of thoracic region: Secondary | ICD-10-CM | POA: Diagnosis not present

## 2021-06-09 DIAGNOSIS — M9903 Segmental and somatic dysfunction of lumbar region: Secondary | ICD-10-CM | POA: Diagnosis not present

## 2021-06-12 DIAGNOSIS — M9903 Segmental and somatic dysfunction of lumbar region: Secondary | ICD-10-CM | POA: Diagnosis not present

## 2021-06-12 DIAGNOSIS — M9902 Segmental and somatic dysfunction of thoracic region: Secondary | ICD-10-CM | POA: Diagnosis not present

## 2021-06-12 DIAGNOSIS — M62830 Muscle spasm of back: Secondary | ICD-10-CM | POA: Diagnosis not present

## 2021-06-12 DIAGNOSIS — M546 Pain in thoracic spine: Secondary | ICD-10-CM | POA: Diagnosis not present

## 2021-06-12 DIAGNOSIS — M9905 Segmental and somatic dysfunction of pelvic region: Secondary | ICD-10-CM | POA: Diagnosis not present

## 2021-06-13 ENCOUNTER — Telehealth: Payer: Self-pay | Admitting: Cardiology

## 2021-06-13 NOTE — Telephone Encounter (Signed)
[name] would like to know if it's okay to change her OV to a Video Visit on 06/15/21 at 3:20pm Pt has an upset stomach and she does not feel comfortable coming into the office

## 2021-06-13 NOTE — Telephone Encounter (Signed)
Advised its okay to change to virtual visit (phone) Aware to have vitals available when nurse calls to review medications

## 2021-06-15 ENCOUNTER — Encounter: Payer: Self-pay | Admitting: Nutrition

## 2021-06-15 ENCOUNTER — Encounter: Payer: Self-pay | Admitting: Cardiology

## 2021-06-15 ENCOUNTER — Ambulatory Visit (INDEPENDENT_AMBULATORY_CARE_PROVIDER_SITE_OTHER): Payer: PPO | Admitting: Cardiology

## 2021-06-15 VITALS — BP 115/51 | HR 70 | Ht 59.0 in | Wt 215.0 lb

## 2021-06-15 DIAGNOSIS — E782 Mixed hyperlipidemia: Secondary | ICD-10-CM

## 2021-06-15 DIAGNOSIS — I10 Essential (primary) hypertension: Secondary | ICD-10-CM | POA: Diagnosis not present

## 2021-06-15 DIAGNOSIS — R002 Palpitations: Secondary | ICD-10-CM | POA: Diagnosis not present

## 2021-06-15 MED ORDER — ATORVASTATIN CALCIUM 40 MG PO TABS: 40.0000 mg | ORAL_TABLET | ORAL | 3 refills | Status: DC

## 2021-06-15 NOTE — Patient Instructions (Signed)
Medication Instructions:  Change your Atorvastatin to 40mg  every other day  A new prescription has been seen to pharmacy today to reflect the change.  Continue all other medications.     Labwork: none  Testing/Procedures: none  Follow-Up: 6 months   Any Other Special Instructions Will Be Listed Below (If Applicable).   If you need a refill on your cardiac medications before your next appointment, please call your pharmacy.

## 2021-06-15 NOTE — Addendum Note (Signed)
Addended by: Laurine Blazer on: 06/15/2021 02:48 PM   Modules accepted: Orders

## 2021-06-15 NOTE — Progress Notes (Signed)
Virtual Visit via Telephone Note   This visit type was conducted due to national recommendations for restrictions regarding the COVID-19 Pandemic (e.g. social distancing) in an effort to limit this patient's exposure and mitigate transmission in our community.  Due to her co-morbid illnesses, this patient is at least at moderate risk for complications without adequate follow up.  This format is felt to be most appropriate for this patient at this time.  The patient did not have access to video technology/had technical difficulties with video requiring transitioning to audio format only (telephone).  All issues noted in this document were discussed and addressed.  No physical exam could be performed with this format.  Please refer to the patient's chart for her  consent to telehealth for [facility].    Date:  06/15/2021   ID:  [name], DOB 05-Aug-1945, MRN [id number] The patient was identified using 2 identifiers.  Patient Location: Home Provider Location: Office/Clinic   PCP:  [name], MD   [facility] Providers Cardiologist:  Carlyle Dolly, MD     Evaluation Performed:  Follow-Up Visit  Chief Complaint:  Follow up visit  History of Present Illness:    [name] is a 76 y.o. female seen today for follow up of the following mediacl problems.    1. HTN - off HCTZ due to low Na during Jan 2021 admission for TIA. Norvasc 10mg  was also stopped at that time to allower permissive HTN, eventually restarted at lower dose of 5mg  daily      - complaint with meds - home bp's 110s-130s/50s-80s    2. TIA - admission Jan 2021 - on high dose ASA, statin   3. Hyperlipidemia - 09/2019 TC 139 TG 54 HDL 72 LDL 55 - 03/2020 TC 136 TG 49 HDL 74 LDL 51 - compliant with statin  12/2020 TC 149 TG 53 HDL 92 LDL 46  - has been taking atorvasting every other day due to muscle cramps    4. OSA - she is on cpap - last sleep study in 2021   5. Palpitations - can  have some palpitations, 3-4 times a week. Lasts up to 5-15 minutes   -denies heavy caffine or EtOH   Jan 2022 monitor frequent PACS, rare PVCs - denies any palpitations.   The patient does not have symptoms concerning for COVID-19 infection (fever, chills, cough, or new shortness of breath).    Past Medical History:  Diagnosis Date   Anemia    Anxiety    Arthritis    Complication of anesthesia    "pt. holds her breath when waking up"   Depression    GERD (gastroesophageal reflux disease)    Hypercholesteremia    Hypertension    Hypothyroidism    Osteoporosis    PONV (postoperative nausea and vomiting)    Staph infection    abdomen and returned 5 yrs. later   Past Surgical History:  Procedure Laterality Date   ABDOMINAL HYSTERECTOMY     CATARACT EXTRACTION W/PHACO Right 01/22/2019   Procedure: CATARACT EXTRACTION PHACO AND INTRAOCULAR LENS PLACEMENT (IOC);  Surgeon: [name], MD;  Location: [facility];  Service: Ophthalmology;  Laterality: Right;  right, CDE: 19.63   CATARACT EXTRACTION W/PHACO Left 02/16/2019   Procedure: CATARACT EXTRACTION PHACO AND INTRAOCULAR LENS PLACEMENT LEFT EYE (CDE: 14.36);  Surgeon: [name], MD;  Location: [facility];  Service: Ophthalmology;  Laterality: Left;   CERVICAL FUSION  1990's   COLON SURGERY  FOOT SURGERY Left    reshaped   HERNIA REPAIR     HIP ARTHROPLASTY Left    JOINT REPLACEMENT Right    hip   LUMBAR SPINE SURGERY     REPLACEMENT TOTAL KNEE Right    THYROIDECTOMY, PARTIAL     TOTAL KNEE ARTHROPLASTY Left 08/21/2016   Procedure: LEFT TOTAL KNEE ARTHROPLASTY;  Surgeon: Renette Butters, MD;  Location: [place];  Service: Orthopedics;  Laterality: Left;     No outpatient medications have been marked as taking for the 06/15/21 encounter (Appointment) with Arnoldo Lenis, MD.     Allergies:   Sulfamethoxazole   Social History   Tobacco Use   Smoking status: Never   Smokeless tobacco: Never  Vaping Use   Vaping  Use: Never used  Substance Use Topics   Alcohol use: No   Drug use: No     Family Hx: The patient's family history is not on file.  ROS:   Please see the history of present illness.     All other systems reviewed and are negative.   Prior CV studies:   The following studies were reviewed today: 03/2017 echo Study Conclusions   - Left ventricle: The cavity size was normal. Systolic function was   normal. The estimated ejection fraction was in the range of 55%   to 60%. Wall motion was normal; there were no regional wall   motion abnormalities. Doppler parameters are consistent with   abnormal left ventricular relaxation (grade 1 diastolic   dysfunction). - Mitral valve: There was moderate regurgitation. - Atrial septum: No defect or patent foramen ovale was identified. - Tricuspid valve: There was mild regurgitation. - Pulmonic valve: There was mild regurgitation. - Pulmonary arteries: PA peak pressure: 41 mm Hg (S). - Systemic veins: The IVC was suboptimally visualized, but appeared   dilated. There was normal respiratory variation. Estimated RAP 8   mmHg.     Jan 2022 7 day zio patch 7 day monitor Min HR 55, Max HR 119, Avg HR 69 Frequent supraventricular ectopy in the form of isolated PACs, couplets, triplets Rare ventricular ectopy in the form of isolated PVCs. Reported symptoms correlated with sinus rhythm with PACs     Labs/Other Tests and Data Reviewed:    EKG:  No ECG reviewed.  Recent Labs: 03/03/2021: BUN 17; Creatinine, Ser 0.71; Hemoglobin 11.0; Platelets 260; Potassium 3.9; Sodium 129   Recent Lipid Panel Lab Results  Component Value Date/Time   CHOL 262 (H) 06/23/2019 03:24 AM   TRIG 38 06/23/2019 03:24 AM   HDL 80 06/23/2019 03:24 AM   CHOLHDL 3.3 06/23/2019 03:24 AM   LDLCALC 174 (H) 06/23/2019 03:24 AM    Wt Readings from Last 3 Encounters:  05/02/21 216 lb 0.8 oz (98 kg)  03/17/21 216 lb (98 kg)  03/03/21 201 lb (91.2 kg)     Risk  Assessment/Calculations:          Objective:    Vital Signs:   Today's Vitals   06/15/21 1421  BP: (!) 115/51  Pulse: 70  Weight: 215 lb (97.5 kg)  Height: 4\' 11"  (1.499 m)   Body mass index is 43.42 kg/m.   ASSESSMENT & PLAN:    1. HTN - at goal, continue current meds   2. Hyperlipidemia - muscle aches unclear if from statin, not improved with taking atorva 80mg  every other day, lower to 40mg  every other day. If ongoing would change to pravatatin 20mg  daily, she will keep  [place] updated.    3.Palpitations - no recent symptoms - prior monitor with just benign ectopy - continue beta blocker.        COVID-19 Education: The signs and symptoms of COVID-19 were discussed with the patient and how to seek care for testing (follow up with PCP or arrange E-visit).  The importance of social distancing was discussed today.  Time:   Today, I have spent 18 minutes with the patient with telehealth technology discussing the above problems.     Medication Adjustments/Labs and Tests Ordered: Current medicines are reviewed at length with the patient today.  Concerns regarding medicines are outlined above.   Tests Ordered: No orders of the defined types were placed in this encounter.   Medication Changes: No orders of the defined types were placed in this encounter.   Follow Up:  In Person 6 months  Signed, Carlyle Dolly, MD  06/15/2021 1:07 PM    [place]

## 2021-06-15 NOTE — Patient Instructions (Addendum)
Eat three meals per day Focus on fresh fruits and vegetables. Cut out fast foods, processed foods and snacks. Drink only water Lose 1 lb per week. Eat meals on time. Don't eat past 7 pm.   Lifestyle Medicine - Whole Food, Plant Predominant Nutrition is highly recommended: Eat Plenty of vegetables, Mushrooms, fruits, Legumes, Whole Grains, Nuts, seeds in lieu of processed meats, processed snacks/pastries red meat, poultry, eggs.    -It is better to avoid simple carbohydrates including: Cakes, Sweet Desserts, Ice Cream, Soda (diet and regular), Sweet Tea, Candies, Chips, Cookies, Store Bought Juices, Alcohol in Excess of  1-2 drinks a day, Lemonade,  Artificial Sweeteners, Doughnuts, Coffee Creamers, "Sugar-free" Products, etc, etc.  This is not a complete list.....  Exercise: If you are able: 30 -60 minutes a day ,4 days a week, or 150 minutes a week.  The longer the better.  Combine stretch, strength, and aerobic activities.  If you were told in the past that you have high risk for cardiovascular diseases, you may seek evaluation by your heart doctor prior to initiating moderate to intense exercise programs.

## 2021-06-16 DIAGNOSIS — M62830 Muscle spasm of back: Secondary | ICD-10-CM | POA: Diagnosis not present

## 2021-06-16 DIAGNOSIS — M9902 Segmental and somatic dysfunction of thoracic region: Secondary | ICD-10-CM | POA: Diagnosis not present

## 2021-06-16 DIAGNOSIS — M546 Pain in thoracic spine: Secondary | ICD-10-CM | POA: Diagnosis not present

## 2021-06-16 DIAGNOSIS — M9905 Segmental and somatic dysfunction of pelvic region: Secondary | ICD-10-CM | POA: Diagnosis not present

## 2021-06-16 DIAGNOSIS — M9903 Segmental and somatic dysfunction of lumbar region: Secondary | ICD-10-CM | POA: Diagnosis not present

## 2021-06-21 DIAGNOSIS — M9903 Segmental and somatic dysfunction of lumbar region: Secondary | ICD-10-CM | POA: Diagnosis not present

## 2021-06-21 DIAGNOSIS — M9902 Segmental and somatic dysfunction of thoracic region: Secondary | ICD-10-CM | POA: Diagnosis not present

## 2021-06-21 DIAGNOSIS — M546 Pain in thoracic spine: Secondary | ICD-10-CM | POA: Diagnosis not present

## 2021-06-21 DIAGNOSIS — M9905 Segmental and somatic dysfunction of pelvic region: Secondary | ICD-10-CM | POA: Diagnosis not present

## 2021-06-21 DIAGNOSIS — M62830 Muscle spasm of back: Secondary | ICD-10-CM | POA: Diagnosis not present

## 2021-06-23 DIAGNOSIS — M546 Pain in thoracic spine: Secondary | ICD-10-CM | POA: Diagnosis not present

## 2021-06-23 DIAGNOSIS — M62830 Muscle spasm of back: Secondary | ICD-10-CM | POA: Diagnosis not present

## 2021-06-23 DIAGNOSIS — M9905 Segmental and somatic dysfunction of pelvic region: Secondary | ICD-10-CM | POA: Diagnosis not present

## 2021-06-23 DIAGNOSIS — M9903 Segmental and somatic dysfunction of lumbar region: Secondary | ICD-10-CM | POA: Diagnosis not present

## 2021-06-23 DIAGNOSIS — M9902 Segmental and somatic dysfunction of thoracic region: Secondary | ICD-10-CM | POA: Diagnosis not present

## 2021-06-26 DIAGNOSIS — M9905 Segmental and somatic dysfunction of pelvic region: Secondary | ICD-10-CM | POA: Diagnosis not present

## 2021-06-26 DIAGNOSIS — M546 Pain in thoracic spine: Secondary | ICD-10-CM | POA: Diagnosis not present

## 2021-06-26 DIAGNOSIS — M9902 Segmental and somatic dysfunction of thoracic region: Secondary | ICD-10-CM | POA: Diagnosis not present

## 2021-06-26 DIAGNOSIS — M9903 Segmental and somatic dysfunction of lumbar region: Secondary | ICD-10-CM | POA: Diagnosis not present

## 2021-06-26 DIAGNOSIS — M62830 Muscle spasm of back: Secondary | ICD-10-CM | POA: Diagnosis not present

## 2021-06-28 ENCOUNTER — Telehealth: Payer: Self-pay | Admitting: Cardiology

## 2021-06-28 NOTE — Telephone Encounter (Signed)
Pt c/o medication issue:  1. Name of Medication: nebivolol (BYSTOLIC) 10 MG tablet  2. How are you currently taking this medication (dosage and times per day)? As directed  3. Are you having a reaction (difficulty breathing--STAT)? no  4. What is your medication issue? Patient's insurance will not cover this medication anymore  Please send new rx to [place], [place] - [address]

## 2021-06-29 NOTE — Telephone Encounter (Signed)
Insurance covers bystolic Co-pay was $60 and now is $100 due to needing to meet deductible per pharmacy tech at [facility].  Patient informed and verbalized understanding of plan. Patient said she received a letter stating insurance would no longer cover it Advised to bring letter to office for review Verbalized understanding

## 2021-06-30 DIAGNOSIS — M62830 Muscle spasm of back: Secondary | ICD-10-CM | POA: Diagnosis not present

## 2021-06-30 DIAGNOSIS — M9905 Segmental and somatic dysfunction of pelvic region: Secondary | ICD-10-CM | POA: Diagnosis not present

## 2021-06-30 DIAGNOSIS — M9902 Segmental and somatic dysfunction of thoracic region: Secondary | ICD-10-CM | POA: Diagnosis not present

## 2021-06-30 DIAGNOSIS — M9903 Segmental and somatic dysfunction of lumbar region: Secondary | ICD-10-CM | POA: Diagnosis not present

## 2021-06-30 DIAGNOSIS — M546 Pain in thoracic spine: Secondary | ICD-10-CM | POA: Diagnosis not present

## 2021-07-03 ENCOUNTER — Telehealth: Payer: Self-pay | Admitting: Cardiology

## 2021-07-03 NOTE — Telephone Encounter (Signed)
[name] is calling stating she is returning [name]'s call from 01/25 in regards to this pt. She is leaving today at 1:30 so if the callback is not prior to then you would be unable to reach her.

## 2021-07-03 NOTE — Telephone Encounter (Signed)
Insurance covers bystolic Co-pay was $93 and now is $100 due to needing to meet deductible per pharmacy tech at [facility].   Patient informed and verbalized understanding of plan. Patient said she received a letter stating insurance would no longer cover it Advised to bring letter to office for review Verbalized understanding

## 2021-07-04 DIAGNOSIS — M1990 Unspecified osteoarthritis, unspecified site: Secondary | ICD-10-CM | POA: Diagnosis not present

## 2021-07-04 DIAGNOSIS — I10 Essential (primary) hypertension: Secondary | ICD-10-CM | POA: Diagnosis not present

## 2021-07-04 DIAGNOSIS — E785 Hyperlipidemia, unspecified: Secondary | ICD-10-CM | POA: Diagnosis not present

## 2021-07-05 DIAGNOSIS — M9903 Segmental and somatic dysfunction of lumbar region: Secondary | ICD-10-CM | POA: Diagnosis not present

## 2021-07-05 DIAGNOSIS — I10 Essential (primary) hypertension: Secondary | ICD-10-CM | POA: Diagnosis not present

## 2021-07-05 DIAGNOSIS — M9905 Segmental and somatic dysfunction of pelvic region: Secondary | ICD-10-CM | POA: Diagnosis not present

## 2021-07-05 DIAGNOSIS — M9902 Segmental and somatic dysfunction of thoracic region: Secondary | ICD-10-CM | POA: Diagnosis not present

## 2021-07-05 DIAGNOSIS — E559 Vitamin D deficiency, unspecified: Secondary | ICD-10-CM | POA: Diagnosis not present

## 2021-07-05 DIAGNOSIS — E039 Hypothyroidism, unspecified: Secondary | ICD-10-CM | POA: Diagnosis not present

## 2021-07-05 DIAGNOSIS — M62830 Muscle spasm of back: Secondary | ICD-10-CM | POA: Diagnosis not present

## 2021-07-05 DIAGNOSIS — M546 Pain in thoracic spine: Secondary | ICD-10-CM | POA: Diagnosis not present

## 2021-07-10 ENCOUNTER — Other Ambulatory Visit (HOSPITAL_COMMUNITY): Payer: Self-pay | Admitting: Family Medicine

## 2021-07-10 DIAGNOSIS — M1990 Unspecified osteoarthritis, unspecified site: Secondary | ICD-10-CM | POA: Diagnosis not present

## 2021-07-10 DIAGNOSIS — M810 Age-related osteoporosis without current pathological fracture: Secondary | ICD-10-CM

## 2021-07-10 DIAGNOSIS — R062 Wheezing: Secondary | ICD-10-CM | POA: Diagnosis not present

## 2021-07-10 DIAGNOSIS — E782 Mixed hyperlipidemia: Secondary | ICD-10-CM | POA: Diagnosis not present

## 2021-07-10 DIAGNOSIS — Z1231 Encounter for screening mammogram for malignant neoplasm of breast: Secondary | ICD-10-CM

## 2021-07-10 DIAGNOSIS — E559 Vitamin D deficiency, unspecified: Secondary | ICD-10-CM | POA: Diagnosis not present

## 2021-07-10 DIAGNOSIS — E039 Hypothyroidism, unspecified: Secondary | ICD-10-CM | POA: Diagnosis not present

## 2021-07-10 DIAGNOSIS — F329 Major depressive disorder, single episode, unspecified: Secondary | ICD-10-CM | POA: Diagnosis not present

## 2021-07-10 DIAGNOSIS — K219 Gastro-esophageal reflux disease without esophagitis: Secondary | ICD-10-CM | POA: Diagnosis not present

## 2021-07-10 DIAGNOSIS — G8320 Monoplegia of upper limb affecting unspecified side: Secondary | ICD-10-CM | POA: Diagnosis not present

## 2021-07-10 DIAGNOSIS — I10 Essential (primary) hypertension: Secondary | ICD-10-CM | POA: Diagnosis not present

## 2021-07-10 DIAGNOSIS — G3184 Mild cognitive impairment, so stated: Secondary | ICD-10-CM | POA: Diagnosis not present

## 2021-07-10 DIAGNOSIS — Z8673 Personal history of transient ischemic attack (TIA), and cerebral infarction without residual deficits: Secondary | ICD-10-CM | POA: Diagnosis not present

## 2021-07-14 DIAGNOSIS — M9905 Segmental and somatic dysfunction of pelvic region: Secondary | ICD-10-CM | POA: Diagnosis not present

## 2021-07-14 DIAGNOSIS — M546 Pain in thoracic spine: Secondary | ICD-10-CM | POA: Diagnosis not present

## 2021-07-14 DIAGNOSIS — M62830 Muscle spasm of back: Secondary | ICD-10-CM | POA: Diagnosis not present

## 2021-07-14 DIAGNOSIS — M9903 Segmental and somatic dysfunction of lumbar region: Secondary | ICD-10-CM | POA: Diagnosis not present

## 2021-07-14 DIAGNOSIS — M9902 Segmental and somatic dysfunction of thoracic region: Secondary | ICD-10-CM | POA: Diagnosis not present

## 2021-08-01 DIAGNOSIS — I10 Essential (primary) hypertension: Secondary | ICD-10-CM | POA: Diagnosis not present

## 2021-08-01 DIAGNOSIS — E119 Type 2 diabetes mellitus without complications: Secondary | ICD-10-CM | POA: Diagnosis not present

## 2021-08-01 DIAGNOSIS — M1990 Unspecified osteoarthritis, unspecified site: Secondary | ICD-10-CM | POA: Diagnosis not present

## 2021-08-08 DIAGNOSIS — G4733 Obstructive sleep apnea (adult) (pediatric): Secondary | ICD-10-CM | POA: Diagnosis not present

## 2021-08-29 DIAGNOSIS — R109 Unspecified abdominal pain: Secondary | ICD-10-CM | POA: Diagnosis not present

## 2021-08-29 DIAGNOSIS — R296 Repeated falls: Secondary | ICD-10-CM | POA: Diagnosis not present

## 2021-08-29 DIAGNOSIS — M545 Low back pain, unspecified: Secondary | ICD-10-CM | POA: Diagnosis not present

## 2021-08-29 DIAGNOSIS — N390 Urinary tract infection, site not specified: Secondary | ICD-10-CM | POA: Diagnosis not present

## 2021-08-29 DIAGNOSIS — R252 Cramp and spasm: Secondary | ICD-10-CM | POA: Diagnosis not present

## 2021-09-01 DIAGNOSIS — I10 Essential (primary) hypertension: Secondary | ICD-10-CM | POA: Diagnosis not present

## 2021-09-01 DIAGNOSIS — M1990 Unspecified osteoarthritis, unspecified site: Secondary | ICD-10-CM | POA: Diagnosis not present

## 2021-09-01 DIAGNOSIS — E119 Type 2 diabetes mellitus without complications: Secondary | ICD-10-CM | POA: Diagnosis not present

## 2021-09-06 DIAGNOSIS — M6281 Muscle weakness (generalized): Secondary | ICD-10-CM | POA: Diagnosis not present

## 2021-09-07 DIAGNOSIS — G4733 Obstructive sleep apnea (adult) (pediatric): Secondary | ICD-10-CM | POA: Diagnosis not present

## 2021-09-14 ENCOUNTER — Encounter: Payer: Self-pay | Admitting: Gastroenterology

## 2021-09-14 NOTE — Progress Notes (Signed)
? ?Referring Provider: [name], MD ?Primary Care Physician:  [name], MD ?Primary Gastroenterologist:  Dr. Gala Romney ? ?Chief Complaint  ?Patient presents with  ?  Positive cologuard   ? ? ?HPI:   ?[name] is a 76 y.o. female presenting today at the request of  [name], MD for positive Cologuard completed in November 2022. ? ?Reviewed labs included in referral.  Hemoglobin low at 10.9 in November 2022 with normocytic indices.  In June and July 2022, hemoglobin was within normal limits at 11.8 and 11.6 respectively.  See lab section below for complete details. ? ?Today:  ?Had some surgery on her small bowel 20 years ago. Had an obstruction. Takes 2 Colace daily which keeps her bowels moving well. Has Bms most every day. Occasionally will skip a day. No brbpr, melena, hematuria, vaginal bleeding, epistaxis, abdominal pain, unintentional weight loss, nausea, vomiting. History of reflux, well controlled on Prilosec 20 mg daily. No dysphagia.  ? ?No prior colonoscopy. No Fhx of colon cancer or colon polyps.  ? ?Past Medical History:  ?Diagnosis Date  ? Anemia   ? Anxiety   ? Arthritis   ? Complication of anesthesia   ? "pt. holds her breath when waking up"  ? Depression   ? GERD (gastroesophageal reflux disease)   ? Hypercholesteremia   ? Hypertension   ? Hypothyroidism   ? Osteoporosis   ? PONV (postoperative nausea and vomiting)   ? Staph infection   ? abdomen and returned 5 yrs. later  ? TIA (transient ischemic attack)   ? ? ?Past Surgical History:  ?Procedure Laterality Date  ? ABDOMINAL HYSTERECTOMY    ? CATARACT EXTRACTION W/PHACO Right 01/22/2019  ? Procedure: CATARACT EXTRACTION PHACO AND INTRAOCULAR LENS PLACEMENT (IOC);  Surgeon: [name], MD;  Location: [facility];  Service: Ophthalmology;  Laterality: Right;  right, CDE: 19.63  ? CATARACT EXTRACTION W/PHACO Left 02/16/2019  ? Procedure: CATARACT EXTRACTION PHACO AND INTRAOCULAR LENS PLACEMENT LEFT EYE (CDE: 14.36);  Surgeon: [name], MD;  Location: [facility];  Service: Ophthalmology;  Laterality: Left;  ? CERVICAL FUSION  1990's  ? COLON SURGERY    ? FOOT SURGERY Left   ? reshaped  ? HERNIA REPAIR    ? HIP ARTHROPLASTY Left   ? JOINT REPLACEMENT Right   ? hip  ? LUMBAR SPINE SURGERY    ? REPLACEMENT TOTAL KNEE Right   ? THYROIDECTOMY, PARTIAL    ? TOTAL KNEE ARTHROPLASTY Left 08/21/2016  ? Procedure: LEFT TOTAL KNEE ARTHROPLASTY;  Surgeon: Renette Butters, MD;  Location: [facility];  Service: Orthopedics;  Laterality: Left;  ? ? ?Current Outpatient Medications  ?Medication Sig Dispense Refill  ? albuterol (VENTOLIN HFA) 108 (90 Base) MCG/ACT inhaler Inhale 2 puffs into the lungs every 4 (four) hours as needed.    ? ALPRAZolam (XANAX) 0.25 MG tablet Take 0.25 mg by mouth at bedtime as needed for anxiety.    ? amLODipine (NORVASC) 2.5 MG tablet Take 2.5 mg by mouth daily.    ? aspirin 325 MG tablet Take 1 tablet (325 mg total) by mouth daily with breakfast. 30 tablet 3  ? atorvastatin (LIPITOR) 40 MG tablet Take 1 tablet (40 mg total) by mouth every other day. Every 2 days. 45 tablet 3  ? diclofenac (VOLTAREN) 75 MG EC tablet Take 75 mg by mouth 2 (two) times daily as needed.    ? docusate sodium (COLACE) 100 MG capsule Take 200 mg by  mouth daily.    ? donepezil (ARICEPT) 5 MG tablet Take 5 mg by mouth daily.    ? DULoxetine (CYMBALTA) 60 MG capsule Take 60 mg by mouth every morning.    ? hydrALAZINE (APRESOLINE) 50 MG tablet Take 1 tablet (50 mg total) by mouth 2 (two) times daily. 180 tablet 1  ? levothyroxine (SYNTHROID) 50 MCG tablet Take 50 mcg by mouth daily before breakfast.     ? losartan (COZAAR) 50 MG tablet Take 50 mg by mouth daily.    ? nebivolol (BYSTOLIC) 10 MG tablet Take 1 tablet (10 mg total) by mouth at bedtime. 30 tablet 3  ? omeprazole (PRILOSEC) 20 MG capsule Take 20 mg by mouth every morning.    ? traMADol (ULTRAM) 50 MG tablet Take 50 mg by mouth as needed.    ? denosumab (PROLIA) 60 MG/ML SOSY injection Inject 60 mg into  the skin every 6 (six) months. (Patient not taking: Reported on 09/15/2021)    ? ?No current facility-administered medications for this visit.  ? ? ?Allergies as of 09/15/2021 - Review Complete 09/15/2021  ?Allergen Reaction Noted  ? Sulfamethoxazole Swelling 04/24/2016  ? ? ?Family History  ?Problem Relation Age of Onset  ? Colon cancer Neg Hx   ? Colon polyps Neg Hx   ? ? ?Social History  ? ?Socioeconomic History  ? Marital status: Widowed  ?  Spouse name: Not on file  ? Number of children: Not on file  ? Years of education: Not on file  ? Highest education level: Not on file  ?Occupational History  ? Not on file  ?Tobacco Use  ? Smoking status: Never  ? Smokeless tobacco: Never  ?Vaping Use  ? Vaping Use: Never used  ?Substance and Sexual Activity  ? Alcohol use: No  ? Drug use: No  ? Sexual activity: Not on file  ?Other Topics Concern  ? Not on file  ?Social History Narrative  ? Not on file  ? ?Social Determinants of Health  ? ?Financial Resource Strain: Not on file  ?Food Insecurity: Not on file  ?Transportation Needs: Not on file  ?Physical Activity: Not on file  ?Stress: Not on file  ?Social Connections: Not on file  ?Intimate Partner Violence: Not on file  ? ? ?Review of Systems: ?Gen: Denies any fever, chills, cold or flulike symptoms, presyncope, syncope. ?CV: Denies chest pain, heart palpitations. ?Resp: Denies shortness of breath or cough. ?GI: See HPI ?GU : Denies urinary burning, urinary frequency, urinary hesitancy ?MS: Denies joint pain ?Derm: Denies rash ?Psych: Denies depression, anxiety ?Heme: See HPI ? ?Physical Exam: ?BP (!) 160/80   Pulse 82   Temp 97.7 ?F (36.5 ?C) (Temporal)   Ht 5' (1.524 m)   Wt 218 lb (98.9 kg)   BMI 42.58 kg/m?  ?General:   Alert and oriented. Pleasant and cooperative. Well-nourished and well-developed.  ?Head:  Normocephalic and atraumatic. ?Eyes:  Without icterus, sclera clear and conjunctiva pink.  ?Ears:  Normal auditory acuity. ?Lungs:  Clear to auscultation  bilaterally. No wheezes, rales, or rhonchi. No distress.  ?Heart:  S1, S2 present without murmurs appreciated.  ?Abdomen:  +BS, soft, non-tender and non-distended. No HSM noted. No guarding or rebound. No masses appreciated.  ?Rectal:  Deferred  ?Msk:  Symmetrical without gross deformities. Normal posture. ?Extremities:  Without edema. ?Neurologic:  Alert and  oriented x4;  grossly normal neurologically. ?Skin:  Intact without significant lesions or rashes. ?Psych:  Normal mood and affect. ? ?Labs: ?  04/07/2021 (included in referral from PCP) ?Hemoglobin A1c: 5.5 ?Lipid panel: Total cholesterol 174, triglycerides 56, HDL 110, LDL 53 ?CMP: Glucose 108, BUN 19, creatinine 0.8, sodium 135, potassium 5.0, chloride 96, calcium 9.7, total protein 6.5, albumin 4.3, total bilirubin 0.3, alk phos 56, AST 32, ALT 24. ?CBC: WBC 5.7, hemoglobin 10.9 (L), hematocrit 33.7 (L), MCV 83, MCH 27.0, MCHC 32.3, platelets 365 ? ?12/13/2020 (included in referral from PCP) ?CBC: WBC 4.4, hemoglobin 11.6, hematocrit 35.8, MCV 87, MCH 28.3, MCHC 32.4, platelets 276. ?TSH: 0.691 ?Free T4: 1.37 ? ?11/09/2020 (included in referral from PCP) ?CBC: WBC 5.9, hemoglobin 11.8, hematocrit 34.9, MCV 85, MCH 28.7, MCHC 33.8, platelets 258 ? ? ?Assessment:  ?76 year old female with history of anxiety/depression, HTN, HLD, hypothyroidism, osteoporosis, GERD, presenting today at the request of Dr. Nevada Crane due to positive Cologuard in November 2022.  Also reviewed labs included with referral which shows mild anemia in November 2022 with a hemoglobin of 10.9 with normocytic indices.  In June and July 2022, hemoglobin was within normal limits at 11.8 and 11.6 respectively.  She has never had a colonoscopy.  She has no overt GI bleeding or any other obvious blood loss.  No significant GI symptoms.  Chronic constipation well controlled on Colace and chronic reflux well-controlled on Prilosec.  She does take diclofenac and 325 mg aspirin daily.  No family history of  colon cancer or colon polyps. ? ?She will need a colonoscopy in the near future to evaluate her positive Cologuard. Regarding her anemia, etiology is unclear.  She does not have CKD.  Possible colonic etiology such a

## 2021-09-14 NOTE — H&P (View-Only) (Signed)
? ?Referring Provider: [name], MD ?Primary Care Physician:  [name], MD ?Primary Gastroenterologist:  Dr. Gala Romney ? ?Chief Complaint  ?Patient presents with  ?  Positive cologuard   ? ? ?HPI:   ?[name] is a 76 y.o. female presenting today at the request of  [name], MD for positive Cologuard completed in November 2022. ? ?Reviewed labs included in referral.  Hemoglobin low at 10.9 in November 2022 with normocytic indices.  In June and July 2022, hemoglobin was within normal limits at 11.8 and 11.6 respectively.  See lab section below for complete details. ? ?Today:  ?Had some surgery on her small bowel 20 years ago. Had an obstruction. Takes 2 Colace daily which keeps her bowels moving well. Has Bms most every day. Occasionally will skip a day. No brbpr, melena, hematuria, vaginal bleeding, epistaxis, abdominal pain, unintentional weight loss, nausea, vomiting. History of reflux, well controlled on Prilosec 20 mg daily. No dysphagia.  ? ?No prior colonoscopy. No Fhx of colon cancer or colon polyps.  ? ?Past Medical History:  ?Diagnosis Date  ? Anemia   ? Anxiety   ? Arthritis   ? Complication of anesthesia   ? "pt. holds her breath when waking up"  ? Depression   ? GERD (gastroesophageal reflux disease)   ? Hypercholesteremia   ? Hypertension   ? Hypothyroidism   ? Osteoporosis   ? PONV (postoperative nausea and vomiting)   ? Staph infection   ? abdomen and returned 5 yrs. later  ? TIA (transient ischemic attack)   ? ? ?Past Surgical History:  ?Procedure Laterality Date  ? ABDOMINAL HYSTERECTOMY    ? CATARACT EXTRACTION W/PHACO Right 01/22/2019  ? Procedure: CATARACT EXTRACTION PHACO AND INTRAOCULAR LENS PLACEMENT (IOC);  Surgeon: [name], MD;  Location: [facility];  Service: Ophthalmology;  Laterality: Right;  right, CDE: 19.63  ? CATARACT EXTRACTION W/PHACO Left 02/16/2019  ? Procedure: CATARACT EXTRACTION PHACO AND INTRAOCULAR LENS PLACEMENT LEFT EYE (CDE: 14.36);  Surgeon: [name], MD;  Location: [facility];  Service: Ophthalmology;  Laterality: Left;  ? CERVICAL FUSION  1990's  ? COLON SURGERY    ? FOOT SURGERY Left   ? reshaped  ? HERNIA REPAIR    ? HIP ARTHROPLASTY Left   ? JOINT REPLACEMENT Right   ? hip  ? LUMBAR SPINE SURGERY    ? REPLACEMENT TOTAL KNEE Right   ? THYROIDECTOMY, PARTIAL    ? TOTAL KNEE ARTHROPLASTY Left 08/21/2016  ? Procedure: LEFT TOTAL KNEE ARTHROPLASTY;  Surgeon: Renette Butters, MD;  Location: [facility];  Service: Orthopedics;  Laterality: Left;  ? ? ?Current Outpatient Medications  ?Medication Sig Dispense Refill  ? albuterol (VENTOLIN HFA) 108 (90 Base) MCG/ACT inhaler Inhale 2 puffs into the lungs every 4 (four) hours as needed.    ? ALPRAZolam (XANAX) 0.25 MG tablet Take 0.25 mg by mouth at bedtime as needed for anxiety.    ? amLODipine (NORVASC) 2.5 MG tablet Take 2.5 mg by mouth daily.    ? aspirin 325 MG tablet Take 1 tablet (325 mg total) by mouth daily with breakfast. 30 tablet 3  ? atorvastatin (LIPITOR) 40 MG tablet Take 1 tablet (40 mg total) by mouth every other day. Every 2 days. 45 tablet 3  ? diclofenac (VOLTAREN) 75 MG EC tablet Take 75 mg by mouth 2 (two) times daily as needed.    ? docusate sodium (COLACE) 100 MG capsule Take 200 mg by  mouth daily.    ? donepezil (ARICEPT) 5 MG tablet Take 5 mg by mouth daily.    ? DULoxetine (CYMBALTA) 60 MG capsule Take 60 mg by mouth every morning.    ? hydrALAZINE (APRESOLINE) 50 MG tablet Take 1 tablet (50 mg total) by mouth 2 (two) times daily. 180 tablet 1  ? levothyroxine (SYNTHROID) 50 MCG tablet Take 50 mcg by mouth daily before breakfast.     ? losartan (COZAAR) 50 MG tablet Take 50 mg by mouth daily.    ? nebivolol (BYSTOLIC) 10 MG tablet Take 1 tablet (10 mg total) by mouth at bedtime. 30 tablet 3  ? omeprazole (PRILOSEC) 20 MG capsule Take 20 mg by mouth every morning.    ? traMADol (ULTRAM) 50 MG tablet Take 50 mg by mouth as needed.    ? denosumab (PROLIA) 60 MG/ML SOSY injection Inject 60 mg into  the skin every 6 (six) months. (Patient not taking: Reported on 09/15/2021)    ? ?No current facility-administered medications for this visit.  ? ? ?Allergies as of 09/15/2021 - Review Complete 09/15/2021  ?Allergen Reaction Noted  ? Sulfamethoxazole Swelling 04/24/2016  ? ? ?Family History  ?Problem Relation Age of Onset  ? Colon cancer Neg Hx   ? Colon polyps Neg Hx   ? ? ?Social History  ? ?Socioeconomic History  ? Marital status: Widowed  ?  Spouse name: Not on file  ? Number of children: Not on file  ? Years of education: Not on file  ? Highest education level: Not on file  ?Occupational History  ? Not on file  ?Tobacco Use  ? Smoking status: Never  ? Smokeless tobacco: Never  ?Vaping Use  ? Vaping Use: Never used  ?Substance and Sexual Activity  ? Alcohol use: No  ? Drug use: No  ? Sexual activity: Not on file  ?Other Topics Concern  ? Not on file  ?Social History Narrative  ? Not on file  ? ?Social Determinants of Health  ? ?Financial Resource Strain: Not on file  ?Food Insecurity: Not on file  ?Transportation Needs: Not on file  ?Physical Activity: Not on file  ?Stress: Not on file  ?Social Connections: Not on file  ?Intimate Partner Violence: Not on file  ? ? ?Review of Systems: ?Gen: Denies any fever, chills, cold or flulike symptoms, presyncope, syncope. ?CV: Denies chest pain, heart palpitations. ?Resp: Denies shortness of breath or cough. ?GI: See HPI ?GU : Denies urinary burning, urinary frequency, urinary hesitancy ?MS: Denies joint pain ?Derm: Denies rash ?Psych: Denies depression, anxiety ?Heme: See HPI ? ?Physical Exam: ?BP (!) 160/80   Pulse 82   Temp 97.7 ?F (36.5 ?C) (Temporal)   Ht 5' (1.524 m)   Wt 218 lb (98.9 kg)   BMI 42.58 kg/m?  ?General:   Alert and oriented. Pleasant and cooperative. Well-nourished and well-developed.  ?Head:  Normocephalic and atraumatic. ?Eyes:  Without icterus, sclera clear and conjunctiva pink.  ?Ears:  Normal auditory acuity. ?Lungs:  Clear to auscultation  bilaterally. No wheezes, rales, or rhonchi. No distress.  ?Heart:  S1, S2 present without murmurs appreciated.  ?Abdomen:  +BS, soft, non-tender and non-distended. No HSM noted. No guarding or rebound. No masses appreciated.  ?Rectal:  Deferred  ?Msk:  Symmetrical without gross deformities. Normal posture. ?Extremities:  Without edema. ?Neurologic:  Alert and  oriented x4;  grossly normal neurologically. ?Skin:  Intact without significant lesions or rashes. ?Psych:  Normal mood and affect. ? ?Labs: ?  04/07/2021 (included in referral from PCP) ?Hemoglobin A1c: 5.5 ?Lipid panel: Total cholesterol 174, triglycerides 56, HDL 110, LDL 53 ?CMP: Glucose 108, BUN 19, creatinine 0.8, sodium 135, potassium 5.0, chloride 96, calcium 9.7, total protein 6.5, albumin 4.3, total bilirubin 0.3, alk phos 56, AST 32, ALT 24. ?CBC: WBC 5.7, hemoglobin 10.9 (L), hematocrit 33.7 (L), MCV 83, MCH 27.0, MCHC 32.3, platelets 365 ? ?12/13/2020 (included in referral from PCP) ?CBC: WBC 4.4, hemoglobin 11.6, hematocrit 35.8, MCV 87, MCH 28.3, MCHC 32.4, platelets 276. ?TSH: 0.691 ?Free T4: 1.37 ? ?11/09/2020 (included in referral from PCP) ?CBC: WBC 5.9, hemoglobin 11.8, hematocrit 34.9, MCV 85, MCH 28.7, MCHC 33.8, platelets 258 ? ? ?Assessment:  ?75-year-old female with history of anxiety/depression, HTN, HLD, hypothyroidism, osteoporosis, GERD, presenting today at the request of Dr. Hall due to positive Cologuard in November 2022.  Also reviewed labs included with referral which shows mild anemia in November 2022 with a hemoglobin of 10.9 with normocytic indices.  In June and July 2022, hemoglobin was within normal limits at 11.8 and 11.6 respectively.  She has never had a colonoscopy.  She has no overt GI bleeding or any other obvious blood loss.  No significant GI symptoms.  Chronic constipation well controlled on Colace and chronic reflux well-controlled on Prilosec.  She does take diclofenac and 325 mg aspirin daily.  No family history of  colon cancer or colon polyps. ? ?She will need a colonoscopy in the near future to evaluate her positive Cologuard. Regarding her anemia, etiology is unclear.  She does not have CKD.  Possible colonic etiology such a

## 2021-09-15 ENCOUNTER — Ambulatory Visit: Payer: PPO | Admitting: Gastroenterology

## 2021-09-15 ENCOUNTER — Encounter: Payer: Self-pay | Admitting: Gastroenterology

## 2021-09-15 VITALS — BP 160/80 | HR 82 | Temp 97.7°F | Ht 60.0 in | Wt 218.0 lb

## 2021-09-15 DIAGNOSIS — D649 Anemia, unspecified: Secondary | ICD-10-CM | POA: Diagnosis not present

## 2021-09-15 DIAGNOSIS — Z79899 Other long term (current) drug therapy: Secondary | ICD-10-CM | POA: Diagnosis not present

## 2021-09-15 DIAGNOSIS — R195 Other fecal abnormalities: Secondary | ICD-10-CM | POA: Diagnosis not present

## 2021-09-15 NOTE — Patient Instructions (Addendum)
Please have blood work completed at [facility] ? ?You need a colonoscopy in the near future to further evaluate your positive Cologuard.  If you have evidence of iron deficiency on your blood work, we will also need to consider an upper endoscopy at the time of your colonoscopy. ? ?It was very nice meeting you today!  ? ?Aliene Altes, PA-C ?[facility] ? ?

## 2021-09-16 LAB — CBC WITH DIFFERENTIAL/PLATELET
Absolute Monocytes: 586 cells/uL (ref 200–950)
Basophils Absolute: 43 cells/uL (ref 0–200)
Basophils Relative: 0.7 %
Eosinophils Absolute: 378 cells/uL (ref 15–500)
Eosinophils Relative: 6.2 %
HCT: 35.4 % (ref 35.0–45.0)
Hemoglobin: 11.1 g/dL — ABNORMAL LOW (ref 11.7–15.5)
Lymphs Abs: 1055 cells/uL (ref 850–3900)
MCH: 26.6 pg — ABNORMAL LOW (ref 27.0–33.0)
MCHC: 31.4 g/dL — ABNORMAL LOW (ref 32.0–36.0)
MCV: 84.9 fL (ref 80.0–100.0)
MPV: 9.8 fL (ref 7.5–12.5)
Monocytes Relative: 9.6 %
Neutro Abs: 4038 cells/uL (ref 1500–7800)
Neutrophils Relative %: 66.2 %
Platelets: 269 10*3/uL (ref 140–400)
RBC: 4.17 10*6/uL (ref 3.80–5.10)
RDW: 14.8 % (ref 11.0–15.0)
Total Lymphocyte: 17.3 %
WBC: 6.1 10*3/uL (ref 3.8–10.8)

## 2021-09-16 LAB — IRON, TOTAL/TOTAL IRON BINDING CAP
%SAT: 8 % (calc) — ABNORMAL LOW (ref 16–45)
Iron: 39 ug/dL — ABNORMAL LOW (ref 45–160)
TIBC: 473 mcg/dL (calc) — ABNORMAL HIGH (ref 250–450)

## 2021-09-16 LAB — VITAMIN B12: Vitamin B-12: 415 pg/mL (ref 200–1100)

## 2021-09-16 LAB — FOLATE: Folate: 10.2 ng/mL

## 2021-09-17 ENCOUNTER — Other Ambulatory Visit: Payer: Self-pay | Admitting: Gastroenterology

## 2021-09-17 DIAGNOSIS — D509 Iron deficiency anemia, unspecified: Secondary | ICD-10-CM

## 2021-09-17 MED ORDER — FERROUS SULFATE 325 (65 FE) MG PO TABS: 325.0000 mg | ORAL_TABLET | Freq: Every day | ORAL | 3 refills | Status: DC

## 2021-09-18 ENCOUNTER — Encounter: Payer: Self-pay | Admitting: *Deleted

## 2021-09-18 MED ORDER — PEG 3350-KCL-NA BICARB-NACL 420 G PO SOLR: ORAL | 0 refills | Status: DC

## 2021-09-18 NOTE — Addendum Note (Signed)
Addended by: Cheron Every on: 09/18/2021 10:22 AM ? ? Modules accepted: Orders ? ?

## 2021-09-18 NOTE — Progress Notes (Signed)
Spoke to pt, informed her of results and recommendations. Pt voiced understanding. Pt stated it would be fine to see Dr. Abbey Chatters if she could get in sooner for procedures. Also, informed to start iron pills daily

## 2021-09-27 NOTE — Patient Instructions (Signed)
? ? ? ? ? ? ? ? [name] ? 09/27/2021  ?  ? '@PREFPERIOPPHARMACY'$ @ ? ? Your procedure is scheduled on  10/02/2021. ? ? Report to [facility] at  21  A.M. ? ? Call this number if you have problems the morning of surgery: ? [phone] ? ? Remember: ? Follow the diet and prep instructions given to you by the office. ? ?  Use your inhaler before you come and bring your rescue inhaler with you. ?  ? Take these medicines the morning of surgery with A SIP OF WATER  ? ?    xanax(if neeed), amlodipine, clonidine, voltaren, cymbalta, levothyroxine, prilosec, tramadol(if needed). ?  ? Do not wear jewelry, make-up or nail polish. ? Do not wear lotions, powders, or perfumes, or deodorant. ? Do not shave 48 hours prior to surgery.  Men may shave face and neck. ? Do not bring valuables to the hospital. ?  is not responsible for any belongings or valuables. ? ?Contacts, dentures or bridgework may not be worn into surgery.  Leave your suitcase in the car.  After surgery it may be brought to your room. ? ?For patients admitted to the hospital, discharge time will be determined by your treatment team. ? ?Patients discharged the day of surgery will not be allowed to drive home and must have someone with them for 24 hours.  ? ? ?Special instructions:   DO NOT smoke tobacco or vape for 24 hours before your procedure. ? ?Please read over the following fact sheets that you were given. ?Anesthesia Post-op Instructions and Care and Recovery After Surgery ?  ? ? ? Upper Endoscopy, Adult, Care After ?This sheet gives you information about how to care for yourself after your procedure. Your health care provider may also give you more specific instructions. If you have problems or questions, contact your health care provider. ?What can I expect after the procedure? ?After the procedure, it is common to have: ?A sore throat. ?Mild stomach pain or discomfort. ?Bloating. ?Nausea. ?Follow these instructions at home: ? ?Follow  instructions from your health care provider about what to eat or drink after your procedure. ?Return to your normal activities as told by your health care provider. Ask your health care provider what activities are safe for you. ?Take over-the-counter and prescription medicines only as told by your health care provider. ?If you were given a sedative during the procedure, it can affect you for several hours. Do not drive or operate machinery until your health care provider says that it is safe. ?Keep all follow-up visits as told by your health care provider. This is important. ?Contact a health care provider if you have: ?A sore throat that lasts longer than one day. ?Trouble swallowing. ?Get help right away if: ?You vomit blood or your vomit looks like coffee grounds. ?You have: ?A fever. ?Bloody, black, or tarry stools. ?A severe sore throat or you cannot swallow. ?Difficulty breathing. ?Severe pain in your chest or abdomen. ?Summary ?After the procedure, it is common to have a sore throat, mild stomach discomfort, bloating, and nausea. ?If you were given a sedative during the procedure, it can affect you for several hours. Do not drive or operate machinery until your health care provider says that it is safe. ?Follow instructions from your health care provider about what to eat or drink after your procedure. ?Return to your normal activities as told by your health care provider. ?This information is not intended to replace  advice given to you by your health care provider. Make sure you discuss any questions you have with your health care provider. ?Document Revised: 03/27/2019 Document Reviewed: 10/21/2017 ?Elsevier Patient Education ? [place]. ?Colonoscopy, Adult, Care After ?The following information offers guidance on how to care for yourself after your procedure. Your health care provider may also give you more specific instructions. If you have problems or questions, contact your health care  provider. ?What can I expect after the procedure? ?After the procedure, it is common to have: ?A small amount of blood in your stool for 24 hours after the procedure. ?Some gas. ?Mild cramping or bloating of your abdomen. ?Follow these instructions at home: ?Eating and drinking ? ?Drink enough fluid to keep your urine pale yellow. ?Follow instructions from your health care provider about eating or drinking restrictions. ?Resume your normal diet as told by your health care provider. Avoid heavy or fried foods that are hard to digest. ?Activity ?Rest as told by your health care provider. ?Avoid sitting for a long time without moving. Get up to take short walks every 1-2 hours. This is important to improve blood flow and breathing. Ask for help if you feel weak or unsteady. ?Return to your normal activities as told by your health care provider. Ask your health care provider what activities are safe for you. ?Managing cramping and bloating ? ?Try walking around when you have cramps or feel bloated. ?If directed, apply heat to your abdomen as told by your health care provider. Use the heat source that your health care provider recommends, such as a moist heat pack or a heating pad. ?Place a towel between your skin and the heat source. ?Leave the heat on for 20-30 minutes. ?Remove the heat if your skin turns bright red. This is especially important if you are unable to feel pain, heat, or cold. You have a greater risk of getting burned. ?General instructions ?If you were given a sedative during the procedure, it can affect you for several hours. Do not drive or operate machinery until your health care provider says that it is safe. ?For the first 24 hours after the procedure: ?Do not sign important documents. ?Do not drink alcohol. ?Do your regular daily activities at a slower pace than normal. ?Eat soft foods that are easy to digest. ?Take over-the-counter and prescription medicines only as told by your health care  provider. ?Keep all follow-up visits. This is important. ?Contact a health care provider if: ?You have blood in your stool 2-3 days after the procedure. ?Get help right away if: ?You have more than a small spotting of blood in your stool. ?You have large blood clots in your stool. ?You have swelling of your abdomen. ?You have nausea or vomiting. ?You have a fever. ?You have increasing pain in your abdomen that is not relieved with medicine. ?These symptoms may be an emergency. Get help right away. Call 911. ?Do not wait to see if the symptoms will go away. ?Do not drive yourself to the hospital. ?Summary ?After the procedure, it is common to have a small amount of blood in your stool. You may also have mild cramping and bloating of your abdomen. ?If you were given a sedative during the procedure, it can affect you for several hours. Do not drive or operate machinery until your health care provider says that it is safe. ?Get help right away if you have a lot of blood in your stool, nausea or vomiting, a fever, or  increased pain in your abdomen. ?This information is not intended to replace advice given to you by your health care provider. Make sure you discuss any questions you have with your health care provider. ?Document Revised: 01/11/2021 Document Reviewed: 01/11/2021 ?Elsevier Patient Education ? [place]. ?Monitored Anesthesia Care, Care After ?This sheet gives you information about how to care for yourself after your procedure. Your health care provider may also give you more specific instructions. If you have problems or questions, contact your health care provider. ?What can I expect after the procedure? ?After the procedure, it is common to have: ?Tiredness. ?Forgetfulness about what happened after the procedure. ?Impaired judgment for important decisions. ?Nausea or vomiting. ?Some difficulty with balance. ?Follow these instructions at home: ?For the time period you were told by your health care  provider: ? ?  ? ?Rest as needed. ?Do not participate in activities where you could fall or become injured. ?Do not drive or use machinery. ?Do not drink alcohol. ?Do not take sleeping pills or medicines that cause dro

## 2021-09-28 ENCOUNTER — Encounter (HOSPITAL_COMMUNITY): Payer: Self-pay

## 2021-09-28 ENCOUNTER — Encounter (HOSPITAL_COMMUNITY)
Admission: RE | Admit: 2021-09-28 | Discharge: 2021-09-28 | Disposition: A | Discharge status: Home / Self Care | Payer: PPO | Source: Ambulatory Visit | Attending: Internal Medicine | Admitting: Internal Medicine

## 2021-09-28 ENCOUNTER — Other Ambulatory Visit: Payer: Self-pay

## 2021-10-01 DIAGNOSIS — K219 Gastro-esophageal reflux disease without esophagitis: Secondary | ICD-10-CM | POA: Diagnosis not present

## 2021-10-01 DIAGNOSIS — E039 Hypothyroidism, unspecified: Secondary | ICD-10-CM | POA: Diagnosis not present

## 2021-10-01 DIAGNOSIS — E782 Mixed hyperlipidemia: Secondary | ICD-10-CM | POA: Diagnosis not present

## 2021-10-01 DIAGNOSIS — I10 Essential (primary) hypertension: Secondary | ICD-10-CM | POA: Diagnosis not present

## 2021-10-02 ENCOUNTER — Ambulatory Visit (HOSPITAL_COMMUNITY)
Admission: RE | Admit: 2021-10-02 | Discharge: 2021-10-02 | Disposition: A | Discharge status: Home / Self Care | Payer: PPO | Source: Ambulatory Visit | Attending: Internal Medicine | Admitting: Internal Medicine

## 2021-10-02 ENCOUNTER — Encounter (HOSPITAL_COMMUNITY): Payer: Self-pay

## 2021-10-02 ENCOUNTER — Ambulatory Visit (HOSPITAL_COMMUNITY): Payer: PPO | Admitting: Anesthesiology

## 2021-10-02 ENCOUNTER — Ambulatory Visit (HOSPITAL_BASED_OUTPATIENT_CLINIC_OR_DEPARTMENT_OTHER): Payer: PPO | Admitting: Anesthesiology

## 2021-10-02 ENCOUNTER — Encounter (HOSPITAL_COMMUNITY)
Admission: RE | Disposition: A | Discharge status: Home / Self Care | Payer: Self-pay | Source: Ambulatory Visit | Attending: Internal Medicine

## 2021-10-02 DIAGNOSIS — K2970 Gastritis, unspecified, without bleeding: Secondary | ICD-10-CM | POA: Diagnosis not present

## 2021-10-02 DIAGNOSIS — F32A Depression, unspecified: Secondary | ICD-10-CM | POA: Insufficient documentation

## 2021-10-02 DIAGNOSIS — I10 Essential (primary) hypertension: Secondary | ICD-10-CM | POA: Insufficient documentation

## 2021-10-02 DIAGNOSIS — K648 Other hemorrhoids: Secondary | ICD-10-CM | POA: Insufficient documentation

## 2021-10-02 DIAGNOSIS — K222 Esophageal obstruction: Secondary | ICD-10-CM | POA: Insufficient documentation

## 2021-10-02 DIAGNOSIS — Z1211 Encounter for screening for malignant neoplasm of colon: Secondary | ICD-10-CM

## 2021-10-02 DIAGNOSIS — D509 Iron deficiency anemia, unspecified: Secondary | ICD-10-CM | POA: Insufficient documentation

## 2021-10-02 DIAGNOSIS — Z8673 Personal history of transient ischemic attack (TIA), and cerebral infarction without residual deficits: Secondary | ICD-10-CM | POA: Insufficient documentation

## 2021-10-02 DIAGNOSIS — K219 Gastro-esophageal reflux disease without esophagitis: Secondary | ICD-10-CM | POA: Diagnosis not present

## 2021-10-02 DIAGNOSIS — K449 Diaphragmatic hernia without obstruction or gangrene: Secondary | ICD-10-CM

## 2021-10-02 DIAGNOSIS — F419 Anxiety disorder, unspecified: Secondary | ICD-10-CM | POA: Diagnosis not present

## 2021-10-02 DIAGNOSIS — K319 Disease of stomach and duodenum, unspecified: Secondary | ICD-10-CM | POA: Diagnosis not present

## 2021-10-02 DIAGNOSIS — R195 Other fecal abnormalities: Secondary | ICD-10-CM | POA: Diagnosis not present

## 2021-10-02 DIAGNOSIS — E039 Hypothyroidism, unspecified: Secondary | ICD-10-CM | POA: Diagnosis not present

## 2021-10-02 DIAGNOSIS — K5730 Diverticulosis of large intestine without perforation or abscess without bleeding: Secondary | ICD-10-CM | POA: Diagnosis not present

## 2021-10-02 DIAGNOSIS — E890 Postprocedural hypothyroidism: Secondary | ICD-10-CM | POA: Diagnosis not present

## 2021-10-02 DIAGNOSIS — Z79899 Other long term (current) drug therapy: Secondary | ICD-10-CM | POA: Insufficient documentation

## 2021-10-02 HISTORY — PX: BIOPSY: SHX5522

## 2021-10-02 HISTORY — PX: COLONOSCOPY WITH PROPOFOL: SHX5780

## 2021-10-02 HISTORY — PX: ESOPHAGOGASTRODUODENOSCOPY (EGD) WITH PROPOFOL: SHX5813

## 2021-10-02 SURGERY — COLONOSCOPY WITH PROPOFOL
Anesthesia: General

## 2021-10-02 MED ORDER — PROPOFOL 500 MG/50ML IV EMUL
INTRAVENOUS | Status: DC | PRN
  Administered 2021-10-02: 150 ug/kg/min via INTRAVENOUS

## 2021-10-02 MED ORDER — LACTATED RINGERS IV SOLN
INTRAVENOUS | Status: DC
  Administered 2021-10-02: 1000 mL via INTRAVENOUS

## 2021-10-02 MED ORDER — PROPOFOL 10 MG/ML IV BOLUS
INTRAVENOUS | Status: DC | PRN
  Administered 2021-10-02: 100 mg via INTRAVENOUS

## 2021-10-02 MED ORDER — LIDOCAINE HCL (CARDIAC) PF 100 MG/5ML IV SOSY
PREFILLED_SYRINGE | INTRAVENOUS | Status: DC | PRN
  Administered 2021-10-02: 50 mg via INTRAVENOUS

## 2021-10-02 NOTE — Op Note (Signed)
[facility] ?Patient Name: [name] ?Procedure Date: 10/02/2021 11:24 AM ?MRN: [id number] ?Date of Birth: [birth year] ?Attending MD: Elon Alas. Abbey Chatters , DO ?CSN: [id number] ?Age: 76 ?Admit Type: Outpatient ?Procedure:                Colonoscopy ?Indications:              Screening for colorectal malignant neoplasm,  ?                          Incidental - Positive Cologuard test ?Providers:                Elon Alas. Abbey Chatters, DO, Tammy Vaught, RN, Crisann  ?                          Wynonia Lawman, Technician ?Referring MD:              ?Medicines:                See the Anesthesia note for documentation of the  ?                          administered medications ?Complications:            No immediate complications. ?Estimated Blood Loss:     Estimated blood loss: none. ?Procedure:                Pre-Anesthesia Assessment: ?                          - The anesthesia plan was to use monitored  ?                          anesthesia care (MAC). ?                          After obtaining informed consent, the colonoscope  ?                          was passed under direct vision. Throughout the  ?                          procedure, the patient's blood pressure, pulse, and  ?                          oxygen saturations were monitored continuously. The  ?                          PCF-HQ190L ([id number]) scope was introduced through  ?                          the anus and advanced to the the cecum, identified  ?                          by appendiceal orifice and ileocecal valve. The  ?                          colonoscopy was performed without difficulty. The  ?  patient tolerated the procedure well. ?Scope In: 11:25:13 AM ?Scope Out: 11:34:48 AM ?Scope Withdrawal Time: 0 hours 7 minutes 5 seconds  ?Total Procedure Duration: 0 hours 9 minutes 35 seconds  ?Findings: ?     The perianal and digital rectal examinations were normal. ?     Non-bleeding internal hemorrhoids were found during retroflexion. ?      Multiple small-mouthed diverticula were found in the sigmoid colon. ?     The exam was otherwise without abnormality. ?Impression:               - Non-bleeding internal hemorrhoids. ?                          - Diverticulosis in the sigmoid colon. ?                          - The examination was otherwise normal. ?                          - No specimens collected. ?Moderate Sedation: ?     Per Anesthesia Care ?Recommendation:           - Patient has a contact number available for  ?                          emergencies. The signs and symptoms of potential  ?                          delayed complications were discussed with the  ?                          patient. Return to normal activities tomorrow.  ?                          Written discharge instructions were provided to the  ?                          patient. ?                          - Resume previous diet. ?                          - Continue present medications. ?                          - Repeat colonoscopy in 10 years for screening  ?                          purposes. ?                          - Return to GI clinic in 6 months. ?Procedure Code(s):        --- Professional --- ?                          Y6503, Colorectal cancer screening; colonoscopy on  ?  individual not meeting criteria for high risk ?Diagnosis Code(s):        --- Professional --- ?                          Z12.11, Encounter for screening for malignant  ?                          neoplasm of colon ?                          K64.8, Other hemorrhoids ?                          K57.30, Diverticulosis of large intestine without  ?                          perforation or abscess without bleeding ?CPT copyright 2019 American Medical Association. All rights reserved. ?The codes documented in this report are preliminary and upon coder review may  ?be revised to meet current compliance requirements. ?Elon Alas. Abbey Chatters, DO ?Elon Alas. Abbey Chatters, DO ?10/02/2021 11:37:45  AM ?This report has been signed electronically. ?Number of Addenda: 0 ?

## 2021-10-02 NOTE — Anesthesia Postprocedure Evaluation (Signed)
Anesthesia Post Note ? ?Patient: [name] ? ?Procedure(s) Performed: COLONOSCOPY WITH PROPOFOL ?ESOPHAGOGASTRODUODENOSCOPY (EGD) WITH PROPOFOL ?BIOPSY ? ?Patient location during evaluation: Phase II ?Anesthesia Type: General ?Level of consciousness: awake ?Pain management: pain level controlled ?Vital Signs Assessment: post-procedure vital signs reviewed and stable ?Respiratory status: spontaneous breathing and respiratory function stable ?Cardiovascular status: blood pressure returned to baseline and stable ?Postop Assessment: no headache and no apparent nausea or vomiting ?Anesthetic complications: no ?Comments: Late entry ? ? ?No notable events documented. ? ? ?Last Vitals:  ?Vitals:  ? 10/02/21 1048 10/02/21 1138  ?BP: (!) 166/69 (!) 109/51  ?Pulse: 88 69  ?Resp: 16 20  ?Temp: 36.8 ?C 36.7 ?C  ?SpO2: 96% 100%  ?  ?Last Pain:  ?Vitals:  ? 10/02/21 1138  ?TempSrc: Axillary  ?PainSc: 0-No pain  ? ? ?  ?  ?  ?  ?  ?  ? ?Louann Sjogren ? ? ? ? ?

## 2021-10-02 NOTE — Op Note (Addendum)
[facility] ?Patient Name: [name] ?Procedure Date: 10/02/2021 11:08 AM ?MRN: [id number] ?Date of Birth: [birth year] ?Attending MD: Elon Alas. Abbey Chatters , DO ?CSN: [id number] ?Age: 76 ?Admit Type: Outpatient ?Procedure:                Upper GI endoscopy ?Indications:              Iron deficiency anemia ?Providers:                Elon Alas. Abbey Chatters, DO, Tammy Vaught, RN, Crisann  ?                          Wynonia Lawman, Technician ?Referring MD:              ?Medicines:                See the Anesthesia note for documentation of the  ?                          administered medications ?Complications:            No immediate complications. ?Estimated Blood Loss:     Estimated blood loss was minimal. ?Procedure:                Pre-Anesthesia Assessment: ?                          - The anesthesia plan was to use monitored  ?                          anesthesia care (MAC). ?                          After obtaining informed consent, the endoscope was  ?                          passed under direct vision. Throughout the  ?                          procedure, the patient's blood pressure, pulse, and  ?                          oxygen saturations were monitored continuously. The  ?                          [id number]-H190 ([id number]) scope was introduced through the  ?                          mouth, and advanced to the second part of duodenum.  ?                          The upper GI endoscopy was accomplished without  ?                          difficulty. The patient tolerated the procedure  ?                          well. ?Scope In: 11:18:32 AM ?  Scope Out: 11:21:55 AM ?Total Procedure Duration: 0 hours 3 minutes 23 seconds  ?Findings: ?     A 3 cm hiatal hernia was present. ?     A mild Schatzki ring was found in the distal esophagus. ?     Patchy mild inflammation characterized by erythema was found in the  ?     entire examined stomach. Biopsies were taken with a cold forceps for  ?     Helicobacter pylori testing. ?     The  duodenal bulb, first portion of the duodenum and second portion of  ?     the duodenum were normal. ?Impression:               - 3 cm hiatal hernia. ?                          - Mild Schatzki ring. ?                          - Gastritis. Biopsied. ?                          - Normal duodenal bulb, first portion of the  ?                          duodenum and second portion of the duodenum. ?Moderate Sedation: ?     Per Anesthesia Care ?Recommendation:           - Patient has a contact number available for  ?                          emergencies. The signs and symptoms of potential  ?                          delayed complications were discussed with the  ?                          patient. Return to normal activities tomorrow.  ?                          Written discharge instructions were provided to the  ?                          patient. ?                          - Resume previous diet. ?                          - Continue present medications. ?                          - Await pathology results. ?                          - Use a proton pump inhibitor PO daily. ?Procedure Code(s):        --- Professional --- ?  34035, Esophagogastroduodenoscopy, flexible,  ?                          transoral; with biopsy, single or multiple ?Diagnosis Code(s):        --- Professional --- ?                          K44.9, Diaphragmatic hernia without obstruction or  ?                          gangrene ?                          K22.2, Esophageal obstruction ?                          K29.70, Gastritis, unspecified, without bleeding ?                          D50.9, Iron deficiency anemia, unspecified ?CPT copyright 2019 American Medical Association. All rights reserved. ?The codes documented in this report are preliminary and upon coder review may  ?be revised to meet current compliance requirements. ?Elon Alas. Abbey Chatters, DO ?Elon Alas. [place], DO ?10/02/2021 11:24:20 AM ?This report has been signed  electronically. ?Number of Addenda: 0 ?

## 2021-10-02 NOTE — Transfer of Care (Signed)
Immediate Anesthesia Transfer of Care Note ? ?Patient: [name] ? ?Procedure(s) Performed: COLONOSCOPY WITH PROPOFOL ?ESOPHAGOGASTRODUODENOSCOPY (EGD) WITH PROPOFOL ?BIOPSY ? ?Patient Location: Short Stay ? ?Anesthesia Type:General ? ?Level of Consciousness: awake ? ?Airway & Oxygen Therapy: Patient Spontanous Breathing ? ?Post-op Assessment: Report given to RN and Post -op Vital signs reviewed and stable ? ?Post vital signs: Reviewed and stable ? ?Last Vitals:  ?Vitals Value Taken Time  ?BP    ?Temp    ?Pulse    ?Resp    ?SpO2    ? ? ?Last Pain:  ?Vitals:  ? 10/02/21 1114  ?TempSrc:   ?PainSc: 3   ?   ? ?Patients Stated Pain Goal: 7 (10/02/21 1048) ? ?Complications: No notable events documented. ?

## 2021-10-02 NOTE — Anesthesia Preprocedure Evaluation (Signed)
Anesthesia Evaluation  ?Patient identified by MRN, date of birth, ID band ?Patient awake ? ? ? ?Reviewed: ?Allergy & Precautions, H&P , NPO status , Patient's Chart, lab work & pertinent test results, reviewed documented beta blocker date and time  ? ?History of Anesthesia Complications ?(+) PONV and history of anesthetic complications ? ?Airway ?Mallampati: II ? ?TM Distance: >3 FB ?Neck ROM: full ? ? ? Dental ?no notable dental hx. ? ?  ?Pulmonary ?neg pulmonary ROS,  ?  ?Pulmonary exam normal ?breath sounds clear to auscultation ? ? ? ? ? ? Cardiovascular ?Exercise Tolerance: Good ?hypertension, negative cardio ROS ? ? ?Rhythm:regular Rate:Normal ? ? ?  ?Neuro/Psych ? Headaches, PSYCHIATRIC DISORDERS Anxiety Depression TIA  ? GI/Hepatic ?Neg liver ROS, GERD  Medicated,  ?Endo/Other  ?Hypothyroidism  ? Renal/GU ?negative Renal ROS  ?negative genitourinary ?  ?Musculoskeletal ? ? Abdominal ?  ?Peds ? Hematology ? ?(+) Blood dyscrasia, anemia ,   ?Anesthesia Other Findings ? ? Reproductive/Obstetrics ?negative OB ROS ? ?  ? ? ? ? ? ? ? ? ? ? ? ? ? ?  ?  ? ? ? ? ? ? ? ? ?Anesthesia Physical ?Anesthesia Plan ? ?ASA: 3 ? ?Anesthesia Plan: General  ? ?Post-op Pain Management:   ? ?Induction:  ? ?PONV Risk Score and Plan: Propofol infusion ? ?Airway Management Planned:  ? ?Additional Equipment:  ? ?Intra-op Plan:  ? ?Post-operative Plan:  ? ?Informed Consent: I have reviewed the patients History and Physical, chart, labs and discussed the procedure including the risks, benefits and alternatives for the proposed anesthesia with the patient or authorized representative who has indicated his/her understanding and acceptance.  ? ? ? ?Dental Advisory Given ? ?Plan Discussed with: CRNA ? ?Anesthesia Plan Comments:   ? ? ? ? ? ? ?Anesthesia Quick Evaluation ? ?

## 2021-10-02 NOTE — Discharge Instructions (Addendum)
EGD ?Discharge instructions ?Please read the instructions outlined below and refer to this sheet in the next few weeks. These discharge instructions provide you with general information on caring for yourself after you leave the hospital. Your doctor may also give you specific instructions. While your treatment has been planned according to the most current medical practices available, unavoidable complications occasionally occur. If you have any problems or questions after discharge, please call your doctor. ?ACTIVITY ?You may resume your regular activity but move at a slower pace for the next 24 hours.  ?Take frequent rest periods for the next 24 hours.  ?Walking will help expel (get rid of) the air and reduce the bloated feeling in your abdomen.  ?No driving for 24 hours (because of the anesthesia (medicine) used during the test).  ?You may shower.  ?Do not sign any important legal documents or operate any machinery for 24 hours (because of the anesthesia used during the test).  ?NUTRITION ?Drink plenty of fluids.  ?You may resume your normal diet.  ?Begin with a light meal and progress to your normal diet.  ?Avoid alcoholic beverages for 24 hours or as instructed by your caregiver.  ?MEDICATIONS ?You may resume your normal medications unless your caregiver tells you otherwise.  ?WHAT YOU CAN EXPECT TODAY ?You may experience abdominal discomfort such as a feeling of fullness or ?gas? pains.  ?FOLLOW-UP ?Your doctor will discuss the results of your test with you.  ?SEEK IMMEDIATE MEDICAL ATTENTION IF ANY OF THE FOLLOWING OCCUR: ?Excessive nausea (feeling sick to your stomach) and/or vomiting.  ?Severe abdominal pain and distention (swelling).  ?Trouble swallowing.  ?Temperature over 101? F (37.8? C).  ?Rectal bleeding or vomiting of blood.  ? ? ? ?Colonoscopy ?Discharge Instructions ? ?Read the instructions outlined below and refer to this sheet in the next few weeks. These discharge instructions provide you with  general information on caring for yourself after you leave the hospital. Your doctor may also give you specific instructions. While your treatment has been planned according to the most current medical practices available, unavoidable complications occasionally occur.  ? ?ACTIVITY ?You may resume your regular activity, but move at a slower pace for the next 24 hours.  ?Take frequent rest periods for the next 24 hours.  ?Walking will help get rid of the air and reduce the bloated feeling in your belly (abdomen).  ?No driving for 24 hours (because of the medicine (anesthesia) used during the test).   ?Do not sign any important legal documents or operate any machinery for 24 hours (because of the anesthesia used during the test).  ?NUTRITION ?Drink plenty of fluids.  ?You may resume your normal diet as instructed by your doctor.  ?Begin with a light meal and progress to your normal diet. Heavy or fried foods are harder to digest and may make you feel sick to your stomach (nauseated).  ?Avoid alcoholic beverages for 24 hours or as instructed.  ?MEDICATIONS ?You may resume your normal medications unless your doctor tells you otherwise.  ?WHAT YOU CAN EXPECT TODAY ?Some feelings of bloating in the abdomen.  ?Passage of more gas than usual.  ?Spotting of blood in your stool or on the toilet paper.  ?IF YOU HAD POLYPS REMOVED DURING THE COLONOSCOPY: ?No aspirin products for 7 days or as instructed.  ?No alcohol for 7 days or as instructed.  ?Eat a soft diet for the next 24 hours.  ?FINDING OUT THE RESULTS OF YOUR TEST ?Not all test results are  available during your visit. If your test results are not back during the visit, make an appointment with your caregiver to find out the results. Do not assume everything is normal if you have not heard from your caregiver or the medical facility. It is important for you to follow up on all of your test results.  ?SEEK IMMEDIATE MEDICAL ATTENTION IF: ?You have more than a spotting of  blood in your stool.  ?Your belly is swollen (abdominal distention).  ?You are nauseated or vomiting.  ?You have a temperature over 101.  ?You have abdominal pain or discomfort that is severe or gets worse throughout the day.  ? ?Your EGD revealed mild amount inflammation in your stomach.  I took biopsies of this to rule out infection with a bacteria called H. pylori.  Await pathology results, my office will contact you.  You also have a small hiatal hernia.  Continue on omeprazole daily. ? ?Your colonoscopy was relatively unremarkable.  I did not find any polyps or evidence of colon cancer.  I recommend repeating colonoscopy in 10 years for colon cancer screening purposes.  You do have diverticulosis and internal hemorrhoids. I would recommend increasing fiber in your diet or adding OTC Benefiber/Metamucil. Be sure to drink at least 4 to 6 glasses of water daily.  ? ?Follow-up with GI in 6 months. ? ?I hope you have a great rest of your week! ? ?Elon Alas. Abbey Chatters, D.O. ?Gastroenterology and Hepatology ?[facility] ? ?

## 2021-10-02 NOTE — Interval H&P Note (Signed)
History and Physical Interval Note: ? ?10/02/2021 ?10:41 AM ? ?[name]  has presented today for surgery, with the diagnosis of ida, POSITIVE COLOGUARD.  The various methods of treatment have been discussed with the patient and family. After consideration of risks, benefits and other options for treatment, the patient has consented to  Procedure(s) with comments: ?COLONOSCOPY WITH PROPOFOL (N/A) - 11:45am ?ESOPHAGOGASTRODUODENOSCOPY (EGD) WITH PROPOFOL (N/A) as a surgical intervention.  The patient's history has been reviewed, patient examined, no change in status, stable for surgery.  I have reviewed the patient's chart and labs.  Questions were answered to the patient's satisfaction.   ? ? ?Eloise Harman ? ? ?

## 2021-10-03 LAB — SURGICAL PATHOLOGY

## 2021-10-06 ENCOUNTER — Ambulatory Visit (HOSPITAL_COMMUNITY)
Admission: RE | Admit: 2021-10-06 | Discharge: 2021-10-06 | Disposition: A | Discharge status: Home / Self Care | Payer: PPO | Source: Ambulatory Visit | Attending: Family Medicine | Admitting: Family Medicine

## 2021-10-06 DIAGNOSIS — Z1231 Encounter for screening mammogram for malignant neoplasm of breast: Secondary | ICD-10-CM

## 2021-10-06 DIAGNOSIS — M85832 Other specified disorders of bone density and structure, left forearm: Secondary | ICD-10-CM | POA: Diagnosis not present

## 2021-10-06 DIAGNOSIS — M85852 Other specified disorders of bone density and structure, left thigh: Secondary | ICD-10-CM | POA: Diagnosis not present

## 2021-10-06 DIAGNOSIS — M810 Age-related osteoporosis without current pathological fracture: Secondary | ICD-10-CM | POA: Diagnosis not present

## 2021-10-09 ENCOUNTER — Encounter (HOSPITAL_COMMUNITY): Payer: Self-pay | Admitting: Internal Medicine

## 2021-10-10 DIAGNOSIS — E559 Vitamin D deficiency, unspecified: Secondary | ICD-10-CM | POA: Diagnosis not present

## 2021-10-10 DIAGNOSIS — I10 Essential (primary) hypertension: Secondary | ICD-10-CM | POA: Diagnosis not present

## 2021-10-10 DIAGNOSIS — E039 Hypothyroidism, unspecified: Secondary | ICD-10-CM | POA: Diagnosis not present

## 2021-10-10 DIAGNOSIS — E782 Mixed hyperlipidemia: Secondary | ICD-10-CM | POA: Diagnosis not present

## 2021-10-16 DIAGNOSIS — I10 Essential (primary) hypertension: Secondary | ICD-10-CM | POA: Diagnosis not present

## 2021-10-16 DIAGNOSIS — E782 Mixed hyperlipidemia: Secondary | ICD-10-CM | POA: Diagnosis not present

## 2021-10-16 DIAGNOSIS — E559 Vitamin D deficiency, unspecified: Secondary | ICD-10-CM | POA: Diagnosis not present

## 2021-10-16 DIAGNOSIS — M1990 Unspecified osteoarthritis, unspecified site: Secondary | ICD-10-CM | POA: Diagnosis not present

## 2021-10-16 DIAGNOSIS — G3184 Mild cognitive impairment, so stated: Secondary | ICD-10-CM | POA: Diagnosis not present

## 2021-10-16 DIAGNOSIS — K219 Gastro-esophageal reflux disease without esophagitis: Secondary | ICD-10-CM | POA: Diagnosis not present

## 2021-10-16 DIAGNOSIS — E039 Hypothyroidism, unspecified: Secondary | ICD-10-CM | POA: Diagnosis not present

## 2021-10-16 DIAGNOSIS — F329 Major depressive disorder, single episode, unspecified: Secondary | ICD-10-CM | POA: Diagnosis not present

## 2021-10-16 DIAGNOSIS — Z8673 Personal history of transient ischemic attack (TIA), and cerebral infarction without residual deficits: Secondary | ICD-10-CM | POA: Diagnosis not present

## 2021-10-16 DIAGNOSIS — G8320 Monoplegia of upper limb affecting unspecified side: Secondary | ICD-10-CM | POA: Diagnosis not present

## 2021-10-16 DIAGNOSIS — R0609 Other forms of dyspnea: Secondary | ICD-10-CM | POA: Diagnosis not present

## 2021-10-17 ENCOUNTER — Other Ambulatory Visit (HOSPITAL_COMMUNITY): Payer: Self-pay | Admitting: Radiology

## 2021-10-17 DIAGNOSIS — R0609 Other forms of dyspnea: Secondary | ICD-10-CM

## 2021-10-18 DIAGNOSIS — G4733 Obstructive sleep apnea (adult) (pediatric): Secondary | ICD-10-CM | POA: Diagnosis not present

## 2021-10-24 ENCOUNTER — Telehealth: Payer: Self-pay | Admitting: Cardiology

## 2021-10-24 NOTE — Telephone Encounter (Signed)
Pt would like a callback to discuss some things that another provider told her that she may have going on. Please advise

## 2021-10-24 NOTE — Telephone Encounter (Signed)
Reports being diagnosed with an irritated stomach due to taking aspirin 325 mg daily and it was suggested that she decreased to aspirin 81 mg daily.   Reported being placed on chlorthalidone 25 mg daily prescribed by Dr. Nevada Crane. Medication profile updated.

## 2021-10-27 MED ORDER — ASPIRIN 325 MG PO TABS
325.0000 mg | ORAL_TABLET | Freq: Every day | ORAL | 3 refills | Status: DC
Start: 2021-10-27 — End: 2021-12-13

## 2021-10-27 NOTE — Telephone Encounter (Signed)
Patient notified and verbalized understanding.  She will check with pcp / neuro in regards to the ASA.

## 2021-10-27 NOTE — Telephone Encounter (Signed)
Was on higher dose aspirin due to prior mini strokes, not for a cardiac condition. WOuld be up to pcp or neurology if following to ok the lower dose aspirin. From heart standpoint '81mg'$  of aspirin iis fine. Had low sodium on a medication similar to chlorthalidone in the past, if has been on chlorthallidone at least 2 weeks would recheck a bmet   Zandra Abts MD

## 2021-10-31 ENCOUNTER — Encounter (HOSPITAL_COMMUNITY): Payer: PPO

## 2021-11-01 DIAGNOSIS — E039 Hypothyroidism, unspecified: Secondary | ICD-10-CM | POA: Diagnosis not present

## 2021-11-01 DIAGNOSIS — K219 Gastro-esophageal reflux disease without esophagitis: Secondary | ICD-10-CM | POA: Diagnosis not present

## 2021-11-01 DIAGNOSIS — I10 Essential (primary) hypertension: Secondary | ICD-10-CM | POA: Diagnosis not present

## 2021-11-01 DIAGNOSIS — E782 Mixed hyperlipidemia: Secondary | ICD-10-CM | POA: Diagnosis not present

## 2021-11-15 ENCOUNTER — Ambulatory Visit (HOSPITAL_COMMUNITY)
Admission: RE | Admit: 2021-11-15 | Discharge: 2021-11-15 | Disposition: A | Discharge status: Home / Self Care | Payer: PPO | Source: Ambulatory Visit | Attending: Family Medicine | Admitting: Family Medicine

## 2021-11-15 DIAGNOSIS — R0609 Other forms of dyspnea: Secondary | ICD-10-CM | POA: Insufficient documentation

## 2021-11-15 LAB — PULMONARY FUNCTION TEST
DL/VA % pred: 79 %
DL/VA: 3.39 ml/min/mmHg/L
DLCO unc % pred: 84 %
DLCO unc: 14.15 ml/min/mmHg
FEF 25-75 Post: 1.73 L/sec
FEF 25-75 Pre: 1.34 L/sec
FEF2575-%Change-Post: 29 %
FEF2575-%Pred-Post: 112 %
FEF2575-%Pred-Pre: 87 %
FEV1-%Change-Post: 6 %
FEV1-%Pred-Post: 111 %
FEV1-%Pred-Pre: 103 %
FEV1-Post: 2.01 L
FEV1-Pre: 1.88 L
FEV1FVC-%Change-Post: 3 %
FEV1FVC-%Pred-Pre: 96 %
FEV6-%Change-Post: 3 %
FEV6-%Pred-Post: 116 %
FEV6-%Pred-Pre: 112 %
FEV6-Post: 2.67 L
FEV6-Pre: 2.58 L
FEV6FVC-%Pred-Post: 105 %
FEV6FVC-%Pred-Pre: 105 %
FVC-%Change-Post: 3 %
FVC-%Pred-Post: 110 %
FVC-%Pred-Pre: 106 %
FVC-Post: 2.67 L
FVC-Pre: 2.58 L
Post FEV1/FVC ratio: 75 %
Post FEV6/FVC ratio: 100 %
Pre FEV1/FVC ratio: 73 %
Pre FEV6/FVC Ratio: 100 %
RV % pred: 178 %
RV: 3.67 L
TLC % pred: 147 %
TLC: 6.55 L

## 2021-11-15 MED ORDER — ALBUTEROL SULFATE (2.5 MG/3ML) 0.083% IN NEBU
2.5000 mg | INHALATION_SOLUTION | Freq: Once | RESPIRATORY_TRACT | Status: AC
Start: 2021-11-15 — End: 2021-11-15
  Administered 2021-11-15: 2.5 mg via RESPIRATORY_TRACT

## 2021-11-25 DIAGNOSIS — G4733 Obstructive sleep apnea (adult) (pediatric): Secondary | ICD-10-CM | POA: Diagnosis not present

## 2021-12-01 DIAGNOSIS — E782 Mixed hyperlipidemia: Secondary | ICD-10-CM | POA: Diagnosis not present

## 2021-12-01 DIAGNOSIS — E039 Hypothyroidism, unspecified: Secondary | ICD-10-CM | POA: Diagnosis not present

## 2021-12-01 DIAGNOSIS — K219 Gastro-esophageal reflux disease without esophagitis: Secondary | ICD-10-CM | POA: Diagnosis not present

## 2021-12-01 DIAGNOSIS — I10 Essential (primary) hypertension: Secondary | ICD-10-CM | POA: Diagnosis not present

## 2021-12-08 ENCOUNTER — Telehealth: Payer: Self-pay | Admitting: Cardiology

## 2021-12-08 NOTE — Telephone Encounter (Signed)
Spoke with patient - no fever & not exposed to covid.  Just has some coughing.  Suggested she wait to see how she is feeling on Monday & if symptoms worsen - call back & we can certainly change to a phone call visit.

## 2021-12-08 NOTE — Telephone Encounter (Signed)
New Message:    Patient is scheduled to see Dr Harl Bowie on Wednesday. She have a cold, no fever. She wonder if Dr Harl Bowie rather have a phone visit  with her, rather than to come in for her appointment, on Wednesday?

## 2021-12-13 ENCOUNTER — Encounter: Payer: Self-pay | Admitting: Cardiology

## 2021-12-13 ENCOUNTER — Ambulatory Visit: Payer: PPO | Admitting: Cardiology

## 2021-12-13 VITALS — BP 144/80 | HR 86 | Ht 59.0 in | Wt 234.4 lb

## 2021-12-13 DIAGNOSIS — R0602 Shortness of breath: Secondary | ICD-10-CM

## 2021-12-13 DIAGNOSIS — Z79899 Other long term (current) drug therapy: Secondary | ICD-10-CM

## 2021-12-13 DIAGNOSIS — I10 Essential (primary) hypertension: Secondary | ICD-10-CM

## 2021-12-13 DIAGNOSIS — R490 Dysphonia: Secondary | ICD-10-CM | POA: Diagnosis not present

## 2021-12-13 DIAGNOSIS — E039 Hypothyroidism, unspecified: Secondary | ICD-10-CM

## 2021-12-13 MED ORDER — PRAVASTATIN SODIUM 40 MG PO TABS: 40.0000 mg | ORAL_TABLET | Freq: Every evening | ORAL | 1 refills | Status: DC

## 2021-12-13 NOTE — Progress Notes (Signed)
Clinical Summary Ms. Varghese is a 76 y.o.female seen today for follow up of the following mediacl problems.       1.SOB/hoarseness - recent SOB x 6 months, progressing - SOB with talking, exertion. DOE just walking room to room at home - coarse feeling in her throat/hoarseness. No specific wheezing. Some cough at times - throat feeling and breathing improved with breztri.  - occaiosnal LE edema. Some chest tighness at times, gets better with rest and improves with inhaler - no prior smoking history. Some 2nd and smoke exposure.  - hoarseness about 2 months intermittently. Ca have feeling of pills getting stuck, some food getting stuck.   - 11/2021 PFTs: minimal obstruction, +airtrapping.    2. LE edema - takes prn chlortahldione about once a week - 2-3 weeks weight gain about 14 lbs.    3. HTN - off HCTZ due to low Na during Jan 2021 admission for TIA. Norvasc '10mg'$  was also stopped at that time to allower permissive HTN, eventually restarted at lower dose of '5mg'$  daily    - complaint with meds - home bp's 120s-130s/70-80s, rare 140s to 150s    4. TIA - admission Jan 2021 -on ASA, statin   5. Hyperlipidemia - 09/2019 TC 139 TG 54 HDL 72 LDL 55 - 03/2020 TC 136 TG 49 HDL 74 LDL 51 - compliant with statin   12/2020 TC 149 TG 53 HDL 92 LDL 46  - has been taking atorvasting every other day due to muscle cramps   - last visit lowered atorva to '40mg'$  qod due to muscle aches -ongoing muscle aches   6. OSA - she is on cpap - last sleep study in 2021   7. Palpitations - can have some palpitations, 3-4 times a week. Lasts up to 5-15 minutes   -denies heavy caffine or EtOH   Jan 2022 monitor frequent PACS, rare PVCs - no significant issues    Past Medical History:  Diagnosis Date   Anemia    Anxiety    Arthritis    Complication of anesthesia    "pt. holds her breath when waking up"   Depression    GERD (gastroesophageal reflux disease)    Hypercholesteremia     Hypertension    Hypothyroidism    Osteoporosis    PONV (postoperative nausea and vomiting)    Staph infection    abdomen and returned 5 yrs. later   TIA (transient ischemic attack)      Allergies  Allergen Reactions   Carvedilol Other (See Comments)    Weaknesses/shortness of breath/dyspepsia/cramps   Sulfamethoxazole Swelling    Lips swell and feel numb     Current Outpatient Medications  Medication Sig Dispense Refill   albuterol (VENTOLIN HFA) 108 (90 Base) MCG/ACT inhaler Inhale 2 puffs into the lungs every 4 (four) hours as needed.     ALPRAZolam (XANAX) 0.25 MG tablet Take 0.25 mg by mouth daily as needed for anxiety.     amLODipine (NORVASC) 2.5 MG tablet Take 2.5 mg by mouth daily.     aspirin 325 MG tablet Take 1 tablet (325 mg total) by mouth daily with breakfast. 30 tablet 3   atorvastatin (LIPITOR) 40 MG tablet Take 1 tablet (40 mg total) by mouth every other day. Every 2 days. 45 tablet 3   Biotin w/ Vitamins C & E (HAIR/SKIN/NAILS PO) Take 3 tablets by mouth daily. Gummy     carboxymethylcellulose (REFRESH PLUS) 0.5 % SOLN 1-2 drops 3 (  three) times daily as needed (dry/irritated eyes).     chlorthalidone (HYGROTON) 25 MG tablet Take 25 mg by mouth as needed.     cloNIDine (CATAPRES) 0.1 MG tablet Take 0.1 mg by mouth daily as needed (diastolic blood pressure >10.).     diclofenac (VOLTAREN) 75 MG EC tablet Take 75 mg by mouth in the morning and at bedtime.     docusate sodium (COLACE) 100 MG capsule Take 200 mg by mouth in the morning.     donepezil (ARICEPT) 5 MG tablet Take 5 mg by mouth at bedtime.     DULoxetine (CYMBALTA) 60 MG capsule Take 60 mg by mouth every morning.     ferrous sulfate 325 (65 FE) MG tablet Take 1 tablet (325 mg total) by mouth daily. 30 tablet 3   hydrALAZINE (APRESOLINE) 50 MG tablet Take 1 tablet (50 mg total) by mouth 2 (two) times daily. 180 tablet 1   levothyroxine (SYNTHROID) 50 MCG tablet Take 50 mcg by mouth daily before  breakfast.      losartan (COZAAR) 50 MG tablet Take 50 mg by mouth every evening.     Magnesium 250 MG TABS Take 250 mg by mouth daily.     nebivolol (BYSTOLIC) 10 MG tablet Take 1 tablet (10 mg total) by mouth at bedtime. 30 tablet 3   omeprazole (PRILOSEC) 20 MG capsule Take 20 mg by mouth every morning.     traMADol (ULTRAM) 50 MG tablet Take 50 mg by mouth 2 (two) times daily as needed (pain.).     No current facility-administered medications for this visit.     Past Surgical History:  Procedure Laterality Date   ABDOMINAL HYSTERECTOMY     BIOPSY  10/02/2021   Procedure: BIOPSY;  Surgeon: Eloise Harman, DO;  Location: [facility];  Service: Endoscopy;;   CATARACT EXTRACTION W/PHACO Right 01/22/2019   Procedure: CATARACT EXTRACTION PHACO AND INTRAOCULAR LENS PLACEMENT (IOC);  Surgeon: [name], MD;  Location: [facility];  Service: Ophthalmology;  Laterality: Right;  right, CDE: 19.63   CATARACT EXTRACTION W/PHACO Left 02/16/2019   Procedure: CATARACT EXTRACTION PHACO AND INTRAOCULAR LENS PLACEMENT LEFT EYE (CDE: 14.36);  Surgeon: [name], MD;  Location: [facility];  Service: Ophthalmology;  Laterality: Left;   CERVICAL FUSION  1990's   COLON SURGERY     COLONOSCOPY WITH PROPOFOL N/A 10/02/2021   Procedure: COLONOSCOPY WITH PROPOFOL;  Surgeon: Eloise Harman, DO;  Location: [facility];  Service: Endoscopy;  Laterality: N/A;  11:45am   ESOPHAGOGASTRODUODENOSCOPY (EGD) WITH PROPOFOL N/A 10/02/2021   Procedure: ESOPHAGOGASTRODUODENOSCOPY (EGD) WITH PROPOFOL;  Surgeon: Eloise Harman, DO;  Location: [facility];  Service: Endoscopy;  Laterality: N/A;   FOOT SURGERY Left    reshaped   HERNIA REPAIR     HIP ARTHROPLASTY Left    JOINT REPLACEMENT Right    hip   LUMBAR SPINE SURGERY     REPLACEMENT TOTAL KNEE Right    THYROIDECTOMY, PARTIAL     TOTAL KNEE ARTHROPLASTY Left 08/21/2016   Procedure: LEFT TOTAL KNEE ARTHROPLASTY;  Surgeon: Renette Butters, MD;  Location: [facility];  Service: Orthopedics;  Laterality: Left;     Allergies  Allergen Reactions   Carvedilol Other (See Comments)    Weaknesses/shortness of breath/dyspepsia/cramps   Sulfamethoxazole Swelling    Lips swell and feel numb      Family History  Problem Relation Age of Onset   Colon cancer Neg Hx    Colon  polyps Neg Hx      Social History Ms. Piazza reports that she has never smoked. She has never used smokeless tobacco. Ms. Pagaduan reports no history of alcohol use.   Review of Systems CONSTITUTIONAL: No weight loss, fever, chills, weakness or fatigue.  HEENT: Eyes: No visual loss, blurred vision, double vision or yellow sclerae.No hearing loss, sneezing, congestion, runny nose or sore throat.  SKIN: No rash or itching.  CARDIOVASCULAR: per hpi RESPIRATORY: No shortness of breath, cough or sputum.  GASTROINTESTINAL: No anorexia, nausea, vomiting or diarrhea. No abdominal pain or blood.  GENITOURINARY: No burning on urination, no polyuria NEUROLOGICAL: No headache, dizziness, syncope, paralysis, ataxia, numbness or tingling in the extremities. No change in bowel or bladder control.  MUSCULOSKELETAL: No muscle, back pain, joint pain or stiffness.  LYMPHATICS: No enlarged nodes. No history of splenectomy.  PSYCHIATRIC: No history of depression or anxiety.  ENDOCRINOLOGIC: No reports of sweating, cold or heat intolerance. No polyuria or polydipsia.  Marland Kitchen   Physical Examination Today's Vitals   12/13/21 1558  BP: (!) 144/80  Pulse: 86  SpO2: 97%  Weight: 234 lb 6.4 oz (106.3 kg)  Height: '4\' 11"'$  (1.499 m)   Body mass index is 47.34 kg/m.  Gen: resting comfortably, no acute distress HEENT: no scleral icterus, pupils equal round and reactive, no palptable cervical adenopathy,  CV: RRR, no mr/g no jvd Resp: Clear to auscultation bilaterally GI: abdomen is soft, non-tender, non-distended, normal bowel sounds, no hepatosplenomegaly MSK: extremities are warm, no edema.   Skin: warm, no rash Neuro:  no focal deficits Psych: appropriate affect   Diagnostic Studies   03/2017 echo Study Conclusions   - Left ventricle: The cavity size was normal. Systolic function was   normal. The estimated ejection fraction was in the range of 55%   to 60%. Wall motion was normal; there were no regional wall   motion abnormalities. Doppler parameters are consistent with   abnormal left ventricular relaxation (grade 1 diastolic   dysfunction). - Mitral valve: There was moderate regurgitation. - Atrial septum: No defect or patent foramen ovale was identified. - Tricuspid valve: There was mild regurgitation. - Pulmonic valve: There was mild regurgitation. - Pulmonary arteries: PA peak pressure: 41 mm Hg (S). - Systemic veins: The IVC was suboptimally visualized, but appeared   dilated. There was normal respiratory variation. Estimated RAP 8   mmHg.     Jan 2022 7 day zio patch 7 day monitor Min HR 55, Max HR 119, Avg HR 69 Frequent supraventricular ectopy in the form of isolated PACs, couplets, triplets Rare ventricular ectopy in the form of isolated PVCs. Reported symptoms correlated with sinus rhythm with PACs      Assessment and Plan   1.SOB/hoarseness - refer to ENT for evaluation - pending eval consider pulmonary giving PFTs.      2. Hyperlipidemia -ongoing muscle aches on atoravstatin, stop atorva and start pravastaitn '20mg'$  daily.     3.Palpitations - doing well, continue current meds  4. LE edema - overall controlled, has been on prn chlorthalidone it appears and has done well  5. HTN - elevated here but home numbers overall at goal, continue current meds   Arnoldo Lenis, M.D.

## 2021-12-13 NOTE — Patient Instructions (Addendum)
Medication Instructions:  Your physician has recommended you make the following change in your medication:  Stop atorvastatin Start pravastatin 40 mg daily Continue other medications the same  Labwork: BMET, TSH & MG-12/14/2021 at [facility] ([address] [place]) Non-fasting  Testing/Procedures: none  Follow-Up: Your physician recommends that you schedule a follow-up appointment in: 6 months  Any Other Special Instructions Will Be Listed Below (If Applicable). You have been referred to an ENT  If you need a refill on your cardiac medications before your next appointment, please call your pharmacy.

## 2021-12-25 DIAGNOSIS — E039 Hypothyroidism, unspecified: Secondary | ICD-10-CM | POA: Diagnosis not present

## 2021-12-25 DIAGNOSIS — I10 Essential (primary) hypertension: Secondary | ICD-10-CM | POA: Diagnosis not present

## 2021-12-25 DIAGNOSIS — R0602 Shortness of breath: Secondary | ICD-10-CM | POA: Diagnosis not present

## 2021-12-25 DIAGNOSIS — Z79899 Other long term (current) drug therapy: Secondary | ICD-10-CM | POA: Diagnosis not present

## 2021-12-26 LAB — BASIC METABOLIC PANEL
BUN: 15 mg/dL (ref 7–25)
CO2: 26 mmol/L (ref 20–32)
Calcium: 9.4 mg/dL (ref 8.6–10.4)
Chloride: 93 mmol/L — ABNORMAL LOW (ref 98–110)
Creat: 0.89 mg/dL (ref 0.60–1.00)
Glucose, Bld: 101 mg/dL — ABNORMAL HIGH (ref 65–99)
Potassium: 4.8 mmol/L (ref 3.5–5.3)
Sodium: 128 mmol/L — ABNORMAL LOW (ref 135–146)

## 2021-12-26 LAB — MAGNESIUM: Magnesium: 1.9 mg/dL (ref 1.5–2.5)

## 2021-12-26 LAB — TSH: TSH: 0.83 mIU/L (ref 0.40–4.50)

## 2022-01-01 ENCOUNTER — Encounter: Payer: Self-pay | Admitting: Cardiology

## 2022-01-05 NOTE — Telephone Encounter (Signed)
I would suggest seeing an ENT as well for the hoarseness, we had placed a referal last visit has she heard back? Sodium is mildly low but similar to prior values so not completely new, how often is she taking the chlortahlidone? WOuld see if able to cut down on frequent to see if helps with cramps  Zandra Abts MD

## 2022-01-19 DIAGNOSIS — N390 Urinary tract infection, site not specified: Secondary | ICD-10-CM | POA: Diagnosis not present

## 2022-01-27 DIAGNOSIS — G4733 Obstructive sleep apnea (adult) (pediatric): Secondary | ICD-10-CM | POA: Diagnosis not present

## 2022-02-01 DIAGNOSIS — E039 Hypothyroidism, unspecified: Secondary | ICD-10-CM | POA: Diagnosis not present

## 2022-02-01 DIAGNOSIS — E782 Mixed hyperlipidemia: Secondary | ICD-10-CM | POA: Diagnosis not present

## 2022-02-01 DIAGNOSIS — K219 Gastro-esophageal reflux disease without esophagitis: Secondary | ICD-10-CM | POA: Diagnosis not present

## 2022-02-01 DIAGNOSIS — I10 Essential (primary) hypertension: Secondary | ICD-10-CM | POA: Diagnosis not present

## 2022-02-08 ENCOUNTER — Telehealth: Payer: Self-pay | Admitting: Cardiology

## 2022-02-08 NOTE — Telephone Encounter (Signed)
FYI--Patient wanted to let Dr. Harl Bowie know that she is scheduled to see Schneider on 10/30 at 9:45 AM.

## 2022-02-12 ENCOUNTER — Encounter: Payer: Self-pay | Admitting: Internal Medicine

## 2022-02-12 ENCOUNTER — Ambulatory Visit: Payer: PPO | Admitting: Internal Medicine

## 2022-02-12 DIAGNOSIS — R058 Other specified cough: Secondary | ICD-10-CM | POA: Diagnosis not present

## 2022-02-12 DIAGNOSIS — R0609 Other forms of dyspnea: Secondary | ICD-10-CM

## 2022-02-12 MED ORDER — PANTOPRAZOLE SODIUM 40 MG PO TBEC: 40.0000 mg | DELAYED_RELEASE_TABLET | Freq: Every day | ORAL | 2 refills | Status: DC

## 2022-02-12 MED ORDER — FAMOTIDINE 20 MG PO TABS: ORAL_TABLET | ORAL | 11 refills | Status: DC

## 2022-02-12 MED ORDER — PREDNISONE 10 MG PO TABS: ORAL_TABLET | ORAL | 0 refills | Status: DC

## 2022-02-12 MED ORDER — BENZONATATE 200 MG PO CAPS: 200.0000 mg | ORAL_CAPSULE | Freq: Three times a day (TID) | ORAL | 1 refills | Status: DC | PRN

## 2022-02-12 NOTE — Patient Instructions (Signed)
Breztri one twice daily x one week, then once daily x one weeks   Pantoprazole (protonix) 40 mg  (prilocsec x 20 x 2)  Take  30-60 min before first meal of the day and Pepcid (famotidine)  20 mg after supper until return to office - this is the best way to tell whether stomach acid is contributing to your problem.    For drainage / throat tickle try take CHLORPHENIRAMINE  4 mg  ("Allergy Relief" '4mg'$   at [facility] should be easiest to find in the blue box usually on bottom shelf)  take one every 4 hours as needed - extremely effective and inexpensive over the counter- may cause drowsiness so start with just a dose or two an hour before bedtime and see how you tolerate it before trying in daytime.  GERD (REFLUX)  is an extremely common cause of respiratory symptoms just like yours , many times with no obvious heartburn at all.    It can be treated with medication, but also with lifestyle changes including elevation of the head of your bed (ideally with 6 -8inch blocks under the headboard of your bed),  Smoking cessation, avoidance of late meals, excessive alcohol, and avoid fatty foods, chocolate, peppermint, colas, red wine, and acidic juices such as orange juice.  NO MINT OR MENTHOL PRODUCTS SO NO COUGH DROPS  USE SUGARLESS CANDY INSTEAD (Jolley ranchers or Stover's or Life Savers) or even ice chips will also do - the key is to swallow to prevent all throat clearing. NO OIL BASED VITAMINS - use powdered substitutes.  Avoid fish oil when coughing.   Prednisone 10 mg take  4 each am x 2 days,   2 each am x 2 days,  1 each am x 2 days and stop  For cough > tessalon 200 mg every 6 hours as needed   Please schedule a follow up office visit in 4-6  weeks, sooner if needed

## 2022-02-12 NOTE — Progress Notes (Unsigned)
[name], female    DOB: 1945-09-07    MRN: [id number]   Brief patient profile:  7   yowf  never smoker healthy child but out of school [employer] due to bad cough x one month in winter referred to pulmonary clinic in [facility]  02/12/2022 by Nevada Crane  for cough.   Thyroid removed age early 75's with main symptom = cough/choking better p thyroid surgery but still squeaky voice then neck surgery anteriorly and since then whenever stresses voice or gets a cold loses voice. ENT eval at [facility] in her 95s' > opened everything up then came back next day and voice texture variable ever since    History of Present Illness  02/12/2022  Pulmonary/ 1st office eval/ [name] / [name]  Chief Complaint  Patient presents with   Consult    Sob and cough pft done 10/17/21 at [facility]   Dyspnea: MMRC3 = can't walk 100 yards even at a slow pace at a flat grade s stopping due to sob   Cough: dry nightly s years  Sleep: with cpcp and 30 degrees electric  SABA use: Breztri   Past Medical History:  Diagnosis Date   Anemia    Anxiety    Arthritis    Complication of anesthesia    "pt. holds her breath when waking up"   Depression    GERD (gastroesophageal reflux disease)    Hypercholesteremia    Hypertension    Hypothyroidism    Osteoporosis    PONV (postoperative nausea and vomiting)    Staph infection    abdomen and returned 5 yrs. later   TIA (transient ischemic attack)     Outpatient Medications Prior to Visit  Medication Sig Dispense Refill   ALPRAZolam (XANAX) 0.25 MG tablet Take 0.25 mg by mouth daily as needed for anxiety.     amLODipine (NORVASC) 2.5 MG tablet Take 2.5 mg by mouth daily.     aspirin EC 81 MG tablet Take 81 mg by mouth daily. Swallow whole.     Biotin w/ Vitamins C & E (HAIR/SKIN/NAILS PO) Take 3 tablets by mouth daily. Gummy     Budeson-Glycopyrrol-Formoterol (BREZTRI AEROSPHERE) 160-9-4.8 MCG/ACT AERO Inhale 2 puffs into the lungs 2 (two) times daily.      carboxymethylcellulose (REFRESH PLUS) 0.5 % SOLN 1-2 drops 3 (three) times daily as needed (dry/irritated eyes).     chlorthalidone (HYGROTON) 25 MG tablet Take 25 mg by mouth as needed.     Cholecalciferol 125 MCG (5000 UT) TABS Take 1 tablet by mouth daily.     cloNIDine (CATAPRES) 0.1 MG tablet Take 0.1 mg by mouth daily as needed (diastolic blood pressure >15.).     diclofenac (VOLTAREN) 75 MG EC tablet Take 75 mg by mouth in the morning and at bedtime.     docusate sodium (COLACE) 100 MG capsule Take 300 mg by mouth in the morning.     donepezil (ARICEPT) 5 MG tablet Take 5 mg by mouth at bedtime.     DULoxetine (CYMBALTA) 60 MG capsule Take 60 mg by mouth every morning.     ferrous sulfate 325 (65 FE) MG tablet Take 1 tablet (325 mg total) by mouth daily. 30 tablet 3   GEMTESA 75 MG TABS Take 1 tablet by mouth daily.     hydrALAZINE (APRESOLINE) 50 MG tablet Take 1 tablet (50 mg total) by mouth 2 (two) times daily. 180 tablet 1   levothyroxine (SYNTHROID) 50 MCG tablet Take 50  mcg by mouth daily before breakfast.      losartan (COZAAR) 50 MG tablet Take 50 mg by mouth every evening.     Magnesium 250 MG TABS Take 250 mg by mouth daily.     nebivolol (BYSTOLIC) 10 MG tablet Take 1 tablet (10 mg total) by mouth at bedtime. 30 tablet 3   omeprazole (PRILOSEC) 20 MG capsule Take 20 mg by mouth every morning.     pravastatin (PRAVACHOL) 40 MG tablet Take 1 tablet (40 mg total) by mouth every evening. 90 tablet 1   traMADol (ULTRAM) 50 MG tablet Take 50 mg by mouth 2 (two) times daily as needed (pain.).     No facility-administered medications prior to visit.     Objective:     BP 138/82 (BP Location: Right Arm, Patient Position: Sitting)   Pulse 74   Temp 98.2 F (36.8 C) (Temporal)   Ht 5' (1.524 m)   Wt 229 lb (103.9 kg)   SpO2 97% Comment: ra  BMI 44.72 kg/m   SpO2: 97 % (ra)  Obese hoarse wf / classic voice fatigue/ mild pseudowheeze   HEENT : Oropharynx  clear     Nasal  turbinates nl    NECK :  without  apparent JVD/ palpable Nodes/TM    LUNGS: no acc muscle use,  Nl contour chest which is clear to A and P bilaterally without cough on insp or exp maneuvers   CV:  RRR  no s3 or murmur or increase in P2, and no edema   ABD:  soft and nontender with nl inspiratory excursion in the supine position. No bruits or organomegaly appreciated   MS:  Nl gait/ ext warm without deformities Or obvious joint restrictions  calf tenderness, cyanosis or clubbing    SKIN: warm and dry with extensive scarring at base of neck anteriorly   NEURO:  alert, approp, nl sensorium with  no motor or cerebellar deficits apparent.    Labs ordered 02/12/2022  :  allergy profile       Assessment   No problem-specific Assessment & Plan notes found for this encounter.     Helen Gully, MD 02/12/2022

## 2022-02-12 NOTE — Assessment & Plan Note (Signed)
Onset p neck sugery in her 30s - Spirometry  11/15/21  ERV 42% @ wt 220 =  And DLC0 12.55 (84%) - 02/12/2022   Walked on RA  x  1.5   lap(s) =  approx 225 ft   ft  @ mod pace, stopped due to sob with lowest 02 sats 94%    No evidence of a pulmonary limitation > see MO

## 2022-02-12 NOTE — Assessment & Plan Note (Addendum)
Onset in her 30s p thyroid surgery with classic voice fatigue Spirometry 02/12/2022  FEV1 2.01 (111%)  Ratio 0.75  (fev1/VC 65% is still nl range for age) p 6 % resp to saba p breztri prior to study but no clinical improvement on it> wean off breztri - 1/96/2229 cyclical cough rx =  Tessalon/ gerd rx/ 1st gen H1 blockers per guidelines  And pred x 6 d then regroup in 4-6 weeks   Upper airway cough syndrome (previously labeled PNDS),  is so named because it's frequently impossible to sort out how much is  CR/sinusitis with freq throat clearing (which can be related to primary GERD)   vs  causing  secondary (" extra esophageal")  GERD from wide swings in gastric pressure that occur with throat clearing, often  promoting self use of mint and menthol lozenges that reduce the lower esophageal sphincter tone and exacerbate the problem further in a cyclical fashion.   These are the same pts (now being labeled as having "irritable larynx syndrome" by some cough centers) who not infrequently have a history of having failed to tolerate ace inhibitors,  dry powder inhalers or biphosphonates or report having atypical/extraesophageal reflux symptoms that don't respond to standard doses of PPI  and are easily confused as having aecopd or asthma flares by even experienced allergists/ pulmonologists (myself included).  Of the three most common causes of  Sub-acute / recurrent or chronic cough, only one (GERD)  can actually contribute to/ trigger  the other two (asthma and post nasal drip syndrome)  and perpetuate the cylce of cough.  While not intuitively obvious, many patients with chronic low grade reflux do not cough until there is a primary insult that disturbs the protective epithelial barrier and exposes sensitive nerve endings.   This is typically viral but can due to PNDS and  either may apply here.   The point is that once this occurs, it is difficult to eliminate the cycle  using anything but a maximally  effective acid suppression regimen at least in the short run, accompanied by an appropriate diet to address non acid GERD and control / eliminate the cough itself for at least 3 days with tessalon, address sensation of throat tickling/globus at hs with 1st gen H1 blockers per guidelines  >>> also so added 6 day taper off  Prednisone starting at 40 mg per day in case of component of Th-2 driven upper or lower airways inflammation (if cough responds short term only to relapse before return while will on full rx for uacs (as above), then  that would point to allergic rhinitis/ asthma or eos bronchitis as alternative dx)   F/u in 4-6 weeks.   Advised: The standardized cough guidelines published in Chest by Lissa Morales in 2006 are still the best available and consist of a multiple step process (up to 12!) , not a single office visit,  and are intended  to address this problem logically,  with an alogrithm dependent on response to empiric treatment at  each progressive step  to determine a specific diagnosis with  minimal addtional testing needed. Therefore if adherence is an issue or can't be accurately verified,  it's very unlikely the standard evaluation and treatment will be successful here.    Furthermore, response to therapy (other than acute cough suppression, which should only be used short term with avoidance of narcotic containing cough syrups if possible), can be a gradual process for which the patient is not likely to  perceive immediate benefit.  Unlike going to an eye doctor where the best perscription is almost always the first one and is immediately effective, this is almost never the case in the management of chronic cough syndromes. Therefore the patient needs to commit up front to consistently adhere to recommendations  for up to 6 weeks of therapy directed at the likely underlying problem(s) before the response can be reasonably evaluated.

## 2022-02-12 NOTE — Assessment & Plan Note (Signed)
C/b ERV 42% at wt 220 11/15/21   Body mass index is 44.72 kg/m.   Lab Results  Component Value Date   TSH 0.83 12/25/2021      Contributing to doe and risk of GERD >>>   reviewed the need and the process to achieve and maintain neg calorie balance > defer f/u primary care including intermittently monitoring thyroid status     Each maintenance medication was reviewed in detail including emphasizing most importantly the difference between maintenance and prns and under what circumstances the prns are to be triggered using an action plan format where appropriate.  Total time for H and P, chart review, counseling,  directly observing portions of ambulatory 02 saturation study/ and generating customized AVS unique to this office visit / same day charting  > 60 min new pt eval with multiple refractory pulmonary symptoms of unknown origin.

## 2022-02-13 ENCOUNTER — Encounter: Payer: Self-pay | Admitting: Cardiology

## 2022-02-13 ENCOUNTER — Encounter: Payer: Self-pay | Admitting: Internal Medicine

## 2022-02-14 LAB — CBC WITH DIFFERENTIAL/PLATELET
Basophils Absolute: 0.1 10*3/uL (ref 0.0–0.2)
Basos: 1 %
EOS (ABSOLUTE): 0.4 10*3/uL (ref 0.0–0.4)
Eos: 6 %
Hematocrit: 43.1 % (ref 34.0–46.6)
Hemoglobin: 14.5 g/dL (ref 11.1–15.9)
Immature Grans (Abs): 0 10*3/uL (ref 0.0–0.1)
Immature Granulocytes: 0 %
Lymphocytes Absolute: 1.1 10*3/uL (ref 0.7–3.1)
Lymphs: 14 %
MCH: 30.8 pg (ref 26.6–33.0)
MCHC: 33.6 g/dL (ref 31.5–35.7)
MCV: 92 fL (ref 79–97)
Monocytes Absolute: 0.7 10*3/uL (ref 0.1–0.9)
Monocytes: 9 %
Neutrophils Absolute: 5.7 10*3/uL (ref 1.4–7.0)
Neutrophils: 70 %
Platelets: 284 10*3/uL (ref 150–450)
RBC: 4.71 x10E6/uL (ref 3.77–5.28)
RDW: 12.7 % (ref 11.7–15.4)
WBC: 8 10*3/uL (ref 3.4–10.8)

## 2022-02-14 LAB — IGE: IgE (Immunoglobulin E), Serum: 258 IU/mL (ref 6–495)

## 2022-02-19 ENCOUNTER — Encounter: Payer: Self-pay | Admitting: *Deleted

## 2022-02-23 DIAGNOSIS — G4733 Obstructive sleep apnea (adult) (pediatric): Secondary | ICD-10-CM | POA: Diagnosis not present

## 2022-03-12 ENCOUNTER — Ambulatory Visit: Payer: PPO | Admitting: Internal Medicine

## 2022-03-12 NOTE — Progress Notes (Deleted)
[name], female    DOB: 11-11-1945    MRN: [id number]   Brief patient profile:  22  yowf  never smoker healthy child  except one episode when missed attending [employer] due to bad cough x one month in winter referred to pulmonary clinic in [facility]  02/12/2022 by [place] Crane  for chronic  cough starting in her 66s   Thyroid removed age early 61's with main symptom = cough/choking better p thyroid surgery but still  tendency to raspy/squeaky voice with throat clearing  then neck surgery anteriorly and since then whenever stresses voice or gets a cold loses voice. ENT eval at [facility] in her 37s' > "opened everything up then it came back next day"  and voice texture variable ever since / ent w/u pending at time of 1st pulmonary eval but insp loop nl by pfts 11/15/21    History of Present Illness  02/12/2022  Pulmonary/ 1st office eval/ Melvyn Novas / [place] [facility]  Chief Complaint  Patient presents with   Consult    Sob and cough pft done 10/17/21 at [facility]   Dyspnea: MMRC3 = can't walk 100 yards even at a slow pace at a flat grade s stopping due to sob   Cough: dry bothers esp  hs x years and always loses voice with talking Sleep: with cpcp and 30 degrees electric  SABA use: Breztri not helping cough or doe Rec Breztri one twice daily x one week, then once daily x one weeks  Pantoprazole (protonix) 40 mg  (prilocsec x 20 x 2)  Take  30-60 min before first meal of the day and Pepcid (famotidine)  20 mg after supper  For drainage / throat tickle try take CHLORPHENIRAMINE  4 mg  GERD GERD diet reviewed, bed blocks rec  Prednisone 10 mg take  4 each am x 2 days,   2 each am x 2 days,  1 each am x 2 days and stop For cough > tessalon 200 mg every 6 hours as needed       03/12/2022  f/u ov/[place] office/Katilynn Sinkler re: *** maint on ***  Birthday yesterday No chief complaint on file.   Dyspnea:  *** Cough: *** Sleeping: *** SABA use: *** 02: *** Covid status: *** Lung cancer screening: ***   No  obvious day to day or daytime variability or assoc excess/ purulent sputum or mucus plugs or hemoptysis or cp or chest tightness, subjective wheeze or overt sinus or hb symptoms.   *** without nocturnal  or early am exacerbation  of respiratory  c/o's or need for noct saba. Also denies any obvious fluctuation of symptoms with weather or environmental changes or other aggravating or alleviating factors except as outlined above   No unusual exposure hx or h/o childhood pna/ asthma or knowledge of premature birth.  Current Allergies, Complete Past Medical History, Past Surgical History, Family History, and Social History were reviewed in [employer] record.  ROS  The following are not active complaints unless bolded Hoarseness, sore throat, dysphagia, dental problems, itching, sneezing,  nasal congestion or discharge of excess mucus or purulent secretions, ear ache,   fever, chills, sweats, unintended wt loss or wt gain, classically pleuritic or exertional cp,  orthopnea pnd or arm/hand swelling  or leg swelling, presyncope, palpitations, abdominal pain, anorexia, nausea, vomiting, diarrhea  or change in bowel habits or change in bladder habits, change in stools or change in urine, dysuria, hematuria,  rash, arthralgias, visual complaints, headache, numbness,  weakness or ataxia or problems with walking or coordination,  change in mood or  memory.        No outpatient medications have been marked as taking for the 03/12/22 encounter (Appointment) with Tanda Rockers, MD.            Past Medical History:  Diagnosis Date   Anemia    Anxiety    Arthritis    Complication of anesthesia    "pt. holds her breath when waking up"   Depression    GERD (gastroesophageal reflux disease)    Hypercholesteremia    Hypertension    Hypothyroidism    Osteoporosis    PONV (postoperative nausea and vomiting)    Staph infection    abdomen and returned 5 yrs. later   TIA (transient  ischemic attack)         Objective:     Wt Readings from Last 3 Encounters:  02/12/22 229 lb (103.9 kg)  12/13/21 234 lb 6.4 oz (106.3 kg)  10/02/21 218 lb 14.7 oz (99.3 kg)      Vital signs reviewed  03/12/2022  - Note at rest 02 sats  ***% on ***   General appearance:    ***         Assessment

## 2022-04-02 DIAGNOSIS — K219 Gastro-esophageal reflux disease without esophagitis: Secondary | ICD-10-CM | POA: Diagnosis not present

## 2022-04-02 DIAGNOSIS — R490 Dysphonia: Secondary | ICD-10-CM | POA: Diagnosis not present

## 2022-04-04 DIAGNOSIS — G4733 Obstructive sleep apnea (adult) (pediatric): Secondary | ICD-10-CM | POA: Diagnosis not present

## 2022-04-06 ENCOUNTER — Other Ambulatory Visit: Payer: Self-pay | Admitting: Internal Medicine

## 2022-04-17 DIAGNOSIS — E559 Vitamin D deficiency, unspecified: Secondary | ICD-10-CM | POA: Diagnosis not present

## 2022-04-17 DIAGNOSIS — I10 Essential (primary) hypertension: Secondary | ICD-10-CM | POA: Diagnosis not present

## 2022-04-17 DIAGNOSIS — E039 Hypothyroidism, unspecified: Secondary | ICD-10-CM | POA: Diagnosis not present

## 2022-04-17 DIAGNOSIS — E782 Mixed hyperlipidemia: Secondary | ICD-10-CM | POA: Diagnosis not present

## 2022-04-24 DIAGNOSIS — M1990 Unspecified osteoarthritis, unspecified site: Secondary | ICD-10-CM | POA: Diagnosis not present

## 2022-04-24 DIAGNOSIS — Z0000 Encounter for general adult medical examination without abnormal findings: Secondary | ICD-10-CM | POA: Diagnosis not present

## 2022-04-24 DIAGNOSIS — G8320 Monoplegia of upper limb affecting unspecified side: Secondary | ICD-10-CM | POA: Diagnosis not present

## 2022-04-24 DIAGNOSIS — Z23 Encounter for immunization: Secondary | ICD-10-CM | POA: Diagnosis not present

## 2022-04-24 DIAGNOSIS — I10 Essential (primary) hypertension: Secondary | ICD-10-CM | POA: Diagnosis not present

## 2022-04-24 DIAGNOSIS — E782 Mixed hyperlipidemia: Secondary | ICD-10-CM | POA: Diagnosis not present

## 2022-04-24 DIAGNOSIS — E039 Hypothyroidism, unspecified: Secondary | ICD-10-CM | POA: Diagnosis not present

## 2022-04-24 DIAGNOSIS — K219 Gastro-esophageal reflux disease without esophagitis: Secondary | ICD-10-CM | POA: Diagnosis not present

## 2022-04-24 DIAGNOSIS — F329 Major depressive disorder, single episode, unspecified: Secondary | ICD-10-CM | POA: Diagnosis not present

## 2022-04-24 DIAGNOSIS — Z8673 Personal history of transient ischemic attack (TIA), and cerebral infarction without residual deficits: Secondary | ICD-10-CM | POA: Diagnosis not present

## 2022-04-24 DIAGNOSIS — E559 Vitamin D deficiency, unspecified: Secondary | ICD-10-CM | POA: Diagnosis not present

## 2022-05-04 DIAGNOSIS — G4733 Obstructive sleep apnea (adult) (pediatric): Secondary | ICD-10-CM | POA: Diagnosis not present

## 2022-05-09 ENCOUNTER — Ambulatory Visit (INDEPENDENT_AMBULATORY_CARE_PROVIDER_SITE_OTHER): Payer: PPO

## 2022-05-09 ENCOUNTER — Ambulatory Visit
Admission: EM | Admit: 2022-05-09 | Discharge: 2022-05-09 | Disposition: A | Discharge status: Home / Self Care | Payer: PPO | Attending: Family Medicine | Admitting: Family Medicine

## 2022-05-09 DIAGNOSIS — S6991XA Unspecified injury of right wrist, hand and finger(s), initial encounter: Secondary | ICD-10-CM | POA: Diagnosis not present

## 2022-05-09 DIAGNOSIS — S52531A Colles' fracture of right radius, initial encounter for closed fracture: Secondary | ICD-10-CM

## 2022-05-09 DIAGNOSIS — M25531 Pain in right wrist: Secondary | ICD-10-CM

## 2022-05-09 MED ORDER — HYDROCODONE-ACETAMINOPHEN 5-325 MG PO TABS: 1.0000 | ORAL_TABLET | Freq: Two times a day (BID) | ORAL | 0 refills | Status: DC | PRN

## 2022-05-09 NOTE — ED Provider Notes (Signed)
[facility]    CSN: [id number] Arrival date & time: 05/09/22  1751      History   Chief Complaint No chief complaint on file.   HPI [name] is a 76 y.o. female.   Patient presenting today with severe right wrist pain, swelling after a fall this morning onto an outstretched hand.  She states decreased range of motion in this area but no numbness, tingling, discoloration.  Did not injure anywhere else in the fall other than some soreness in the back and shoulders.  So far taking her typical tramadol prescription which is a chronic medication for her but states this is not helping with her current pain.    Past Medical History:  Diagnosis Date   Anemia    Anxiety    Arthritis    Complication of anesthesia    "pt. holds her breath when waking up"   Depression    GERD (gastroesophageal reflux disease)    Hypercholesteremia    Hypertension    Hypothyroidism    Osteoporosis    PONV (postoperative nausea and vomiting)    Staph infection    abdomen and returned 5 yrs. later   TIA (transient ischemic attack)     Patient Active Problem List   Diagnosis Date Noted   Upper airway cough syndrome 02/12/2022   DOE (dyspnea on exertion) 02/12/2022   Morbid obesity due to excess calories ([place]) 02/12/2022   Positive colorectal cancer screening using Cologuard test 09/15/2021   Anemia 09/15/2021   COVID-19 virus infection 06/23/2019   Transient speech disturbance 06/22/2019   Hypothyroidism    Migraine equivalent 04/16/2017   Anxiety 04/16/2017   Palpitation 04/16/2017   Hyperlipidemia 04/16/2017   Essential hypertension 03/06/2017   Hypokalemia 03/06/2017   Hyponatremia 03/06/2017   TIA (transient ischemic attack) 03/05/2017   Primary localized osteoarthritis of left knee 08/22/2016   Paresthesia 08/10/2016   Weakness 08/10/2016   Depression 07/31/2016   S/P thyroidectomy 07/31/2016   Primary osteoarthritis of left knee 07/31/2016   S/P cervical  spinal fusion 07/31/2016   S/P total knee arthroplasty, right 07/31/2016   SUBLUXATION, RADIAL HEAD 10/27/2009   CONTUSION, ELBOW 10/27/2009    Past Surgical History:  Procedure Laterality Date   ABDOMINAL HYSTERECTOMY     BIOPSY  10/02/2021   Procedure: BIOPSY;  Surgeon: Eloise Harman, DO;  [facility];  Service: Endoscopy;;   CATARACT EXTRACTION W/PHACO Right 01/22/2019   Procedure: CATARACT EXTRACTION PHACO AND INTRAOCULAR LENS PLACEMENT ([place]);  Surgeon: [name], MD;  Location: [facility];  Service: Ophthalmology;  Laterality: Right;  right, CDE: 19.63   CATARACT EXTRACTION W/PHACO Left 02/16/2019   Procedure: CATARACT EXTRACTION PHACO AND INTRAOCULAR LENS PLACEMENT LEFT EYE (CDE: 14.36);  Surgeon: [name], MD;  Location: [facility];  Service: Ophthalmology;  Laterality: Left;   CERVICAL FUSION  1990's   COLON SURGERY     COLONOSCOPY WITH PROPOFOL N/A 10/02/2021   Procedure: COLONOSCOPY WITH PROPOFOL;  Surgeon: Eloise Harman, DO;  [facility];  Service: Endoscopy;  Laterality: N/A;  11:45am   ESOPHAGOGASTRODUODENOSCOPY (EGD) WITH PROPOFOL N/A 10/02/2021   Procedure: ESOPHAGOGASTRODUODENOSCOPY (EGD) WITH PROPOFOL;  Surgeon: Eloise Harman, DO;  [facility];  Service: Endoscopy;  Laterality: N/A;   FOOT SURGERY Left    reshaped   HERNIA REPAIR     HIP ARTHROPLASTY Left    JOINT REPLACEMENT Right    hip   LUMBAR SPINE SURGERY  REPLACEMENT TOTAL KNEE Right    THYROIDECTOMY, PARTIAL     TOTAL KNEE ARTHROPLASTY Left 08/21/2016   Procedure: LEFT TOTAL KNEE ARTHROPLASTY;  Surgeon: Renette Butters, MD;  Location: [place];  Service: Orthopedics;  Laterality: Left;    OB History   No obstetric history on file.      Home Medications    Prior to Admission medications   Medication Sig Start Date End Date Taking? Authorizing Provider  HYDROcodone-acetaminophen (NORCO/VICODIN) 5-325 MG tablet Take 1 tablet by mouth 2 (two) times daily as  needed for moderate pain. 05/09/22  Yes Volney American, PA-C  ALPRAZolam Duanne Moron) 0.25 MG tablet Take 0.25 mg by mouth daily as needed for anxiety.    [provider]  amLODipine (NORVASC) 2.5 MG tablet Take 2.5 mg by mouth daily.    [provider]  aspirin EC 81 MG tablet Take 81 mg by mouth daily. Swallow whole.    [provider]  benzonatate (TESSALON) 200 MG capsule Take 1 capsule by mouth three times daily as needed for cough 04/06/22   Tanda Rockers, MD  Biotin w/ Vitamins C & E (HAIR/SKIN/NAILS PO) Take 3 tablets by mouth daily. Gummy    [provider]  carboxymethylcellulose (REFRESH PLUS) 0.5 % SOLN 1-2 drops 3 (three) times daily as needed (dry/irritated eyes).    [provider]  chlorthalidone (HYGROTON) 25 MG tablet Take 25 mg by mouth as needed. 10/16/21   [provider]  Cholecalciferol 125 MCG (5000 UT) TABS Take 1 tablet by mouth daily.    [provider]  cloNIDine (CATAPRES) 0.1 MG tablet Take 0.1 mg by mouth daily as needed (diastolic blood pressure >23.).    [provider]  diclofenac (VOLTAREN) 75 MG EC tablet Take 75 mg by mouth in the morning and at bedtime. 02/10/21   [provider]  docusate sodium (COLACE) 100 MG capsule Take 300 mg by mouth in the morning.    [provider]  donepezil (ARICEPT) 5 MG tablet Take 5 mg by mouth at bedtime. 01/29/21   [provider]  DULoxetine (CYMBALTA) 60 MG capsule Take 60 mg by mouth every morning. 06/17/19   [provider]  famotidine (PEPCID) 20 MG tablet One after supper 02/12/22   Tanda Rockers, MD  ferrous sulfate 325 (65 FE) MG tablet Take 1 tablet (325 mg total) by mouth daily. 09/17/21 09/17/22  Erenest Rasher, PA-C  GEMTESA 75 MG TABS Take 1 tablet by mouth daily. 11/27/21   [provider]  hydrALAZINE (APRESOLINE) 50 MG tablet Take 1 tablet (50 mg total) by mouth 2 (two) times daily. 11/10/20   Arnoldo Lenis, MD  levothyroxine (SYNTHROID) 50 MCG tablet Take 50 mcg by mouth daily before breakfast.  06/01/16   [provider]  losartan (COZAAR) 50 MG tablet Take 50 mg by mouth every evening. 02/24/21   [provider]  Magnesium 250 MG TABS Take 250 mg by mouth daily.    [provider]  nebivolol (BYSTOLIC) 10 MG tablet Take 1 tablet (10 mg total) by mouth at bedtime. 06/23/19   Roxan Hockey, MD  pantoprazole (PROTONIX) 40 MG tablet Take 1 tablet (40 mg total) by mouth daily. Take 30-60 min before first meal of the day 02/12/22   Tanda Rockers, MD  pravastatin (PRAVACHOL) 40 MG tablet Take 1 tablet (40 mg total) by mouth every evening. 12/13/21   Arnoldo Lenis, MD  predniSONE (DELTASONE)  10 MG tablet Take  4 each am x 2 days,   2 each am x 2 days,  1 each am x 2 days and stop 02/12/22   Tanda Rockers, MD  traMADol (ULTRAM) 50 MG tablet Take 50 mg by mouth 2 (two) times daily as needed (pain.). 01/30/21   [provider]    Family History Family History  Problem Relation Age of Onset   Colon cancer Neg Hx    Colon polyps Neg Hx     Social History Social History   Tobacco Use   Smoking status: Never   Smokeless tobacco: Never  Vaping Use   Vaping Use: Never used  Substance Use Topics   Alcohol use: No   Drug use: No     Allergies   Carvedilol and Sulfamethoxazole   Review of Systems Review of Systems Per HPI  Physical Exam Triage Vital Signs ED Triage Vitals  Enc Vitals Group     BP 05/09/22 1100 (!) 165/73     Pulse Rate 05/09/22 1100 87     Resp 05/09/22 1100 18     Temp 05/09/22 1100 97.8 F (36.6 C)     Temp Source 05/09/22 1100 Oral     SpO2 05/09/22 1100 93 %     Weight --      Height --      Head Circumference --      Peak Flow --      Pain Score 05/09/22 1104 10     Pain Loc --      Pain Edu? --      Excl. in [place]? --    No data found.  Updated Vital Signs BP (!) 165/73 (BP Location: Left Wrist)    Pulse 87   Temp 97.8 F (36.6 C) (Oral)   Resp 18   SpO2 93%   Visual Acuity Right Eye Distance:   Left Eye Distance:   Bilateral Distance:    Right Eye Near:   Left Eye Near:    Bilateral Near:     Physical Exam Vitals and nursing note reviewed.  Constitutional:      Appearance: Normal appearance. She is not ill-appearing.  HENT:     Head: Atraumatic.  Eyes:     Extraocular Movements: Extraocular movements intact.     Conjunctiva/sclera: Conjunctivae normal.  Cardiovascular:     Rate and Rhythm: Normal rate and regular rhythm.     Heart sounds: Normal heart sounds.  Pulmonary:     Effort: Pulmonary effort is normal.     Breath sounds: Normal breath sounds.  Musculoskeletal:        General: Swelling, tenderness and signs of injury present. Normal range of motion.     Cervical back: Normal range of motion and neck supple.     Comments: Tenderness to palpation and significant localized edema near the distal radius, minimal range of motion in the right wrist.  Full range of motion in all 5 fingers with no tenderness to palpation into the distal hand and fingers.  No point tenderness to the forearm or elbow, range of motion of the elbow and shoulder of the right upper extremity intact  Skin:    General: Skin is warm and dry.     Findings: No bruising or erythema.  Neurological:     Mental Status: She is alert and oriented to person, place, and time.     Comments: Right upper extremity neurovascularly intact  Psychiatric:  Mood and Affect: Mood normal.        Thought Content: Thought content normal.        Judgment: Judgment normal.      UC Treatments / Results  Labs (all labs ordered are listed, but only abnormal results are displayed) Labs Reviewed - No data to display  EKG   Radiology DG Wrist Complete Right  Result Date: 05/09/2022 CLINICAL DATA:  [place] this morning. Injured right wrist. EXAM: RIGHT WRIST - COMPLETE 3+ VIEW COMPARISON:  None Available.  FINDINGS: There is a dorsally impacted intra-articular fracture of the distal radius (Colles type fracture. No ulnar styloid fracture. Widened scapholunate joint space suggesting ligamentous insufficiency or laxity. Severe degenerative changes at the scaphoid articulation with the trapezium. Possible remote scaphoid fracture. IMPRESSION: 1. Dorsally impacted intra-articular fracture of the distal radius. 2. Widened scapholunate joint space suggesting ligamentous insufficiency or laxity. Electronically Signed   By: Marijo Sanes M.D.   On: 05/09/2022 12:10    Procedures Procedures (including critical care time)  Medications Ordered in UC Medications - No data to display  Initial Impression / Assessment and Plan / UC Course  I have reviewed the triage vital signs and the nursing notes.  Pertinent labs & imaging results that were available during my care of the patient were reviewed by me and considered in my medical decision making (see chart for details).     X-ray today of the right wrist showing a distal radius fracture as well as possible scaphoid changes.  Radial gutter splint placed, discussed elevation, small supply of Norco for severe breakthrough pain, close orthopedic follow-up in the next day or so.  Resources given.  Return for worsening symptoms.  Final Clinical Impressions(s) / UC Diagnoses   Final diagnoses:  Closed Colles' fracture of right radius, initial encounter   Discharge Instructions   None    ED Prescriptions     Medication Sig Dispense Auth. Provider   HYDROcodone-acetaminophen (NORCO/VICODIN) 5-325 MG tablet Take 1 tablet by mouth 2 (two) times daily as needed for moderate pain. 10 tablet Volney American, [place]      I have reviewed the PDMP during this encounter.   Volney American, [place] 05/09/22 1422

## 2022-05-09 NOTE — ED Triage Notes (Signed)
Pt reports she fells this morning around 5:30 due to her hip giving out . She has a really bad pain in her right wrist. She tried to break her fall with her right hand it hit the floor. Decreased feeling in her 4th and 5th metacarpal. Other two fingers feel tingly. Pain radiates always to her right elbow. Took nothing for pain so far.

## 2022-05-13 ENCOUNTER — Other Ambulatory Visit: Payer: Self-pay | Admitting: Cardiology

## 2022-05-14 ENCOUNTER — Ambulatory Visit (INDEPENDENT_AMBULATORY_CARE_PROVIDER_SITE_OTHER): Payer: PPO | Admitting: Orthopedic Surgery

## 2022-05-14 ENCOUNTER — Encounter: Payer: Self-pay | Admitting: Orthopedic Surgery

## 2022-05-14 VITALS — BP 169/91 | HR 74 | Ht 59.0 in | Wt 224.0 lb

## 2022-05-14 DIAGNOSIS — S52531A Colles' fracture of right radius, initial encounter for closed fracture: Secondary | ICD-10-CM

## 2022-05-14 NOTE — Progress Notes (Signed)
New Patient Visit  Assessment: [name] is a 76 y.o. female with the following: 1. Closed Colles' fracture of right radius, initial encounter  Plan: [name] fell on an outstretched right hand, and sustained a distal radius fracture.  Well-maintained radial height.  Slight loss of volar tilt.  I believe this is amenable to nonoperative management.  She was placed into a cast today.  Will see her back in approximately 3 weeks due to the holidays.  If she has issues with the cast before then, she should plan to return to clinic by 05/25/2022.   Cast application - Right short arm cast   Verbal consent was obtained and the correct extremity was identified. A well padded, appropriately molded short arm cast was applied to the Right arm Fingers remained warm and well perfused.   There were no sharp edges Patient tolerated the procedure well Cast care instructions were provided    Follow-up: No follow-ups on file.  Subjective:  Chief Complaint  Patient presents with   Wrist Injury    Fall tried to catch myself DOI 05/09/22 fracture    History of Present Illness: [name] is a 76 y.o. RHD female who presents for evaluation of right wrist pain.  She fell on an outstretched right hand, less than a week ago.  She lost her balance, and try to catch herself.  She had immediate pain.  She presented to an urgent care center.  She was noted to have a distal radius fracture.  She was placed in a splint.  She presents to clinic today for further evaluation.  She has some chronic numbness and burning in the ulnar nerve distribution.  Otherwise, no additional injuries.  She also has some chronic right elbow pain.   Review of Systems: No fevers or chills No numbness or tingling No chest pain No shortness of breath No bowel or bladder dysfunction No GI distress No headaches   Medical History:  Past Medical History:  Diagnosis Date   Anemia    Anxiety    Arthritis     Complication of anesthesia    "pt. holds her breath when waking up"   Depression    GERD (gastroesophageal reflux disease)    Hypercholesteremia    Hypertension    Hypothyroidism    Osteoporosis    PONV (postoperative nausea and vomiting)    Staph infection    abdomen and returned 5 yrs. later   TIA (transient ischemic attack)     Past Surgical History:  Procedure Laterality Date   ABDOMINAL HYSTERECTOMY     BIOPSY  10/02/2021   Procedure: BIOPSY;  Surgeon: Eloise Harman, DO;  [facility];  Service: Endoscopy;;   CATARACT EXTRACTION W/PHACO Right 01/22/2019   Procedure: CATARACT EXTRACTION PHACO AND INTRAOCULAR LENS PLACEMENT ([facility]);  Surgeon: [name], MD;  Location[facility];  Service: Ophthalmology;  Laterality: Right;  right, CDE: 19.63   CATARACT EXTRACTION W/PHACO Left 02/16/2019   Procedure: CATARACT EXTRACTION PHACO AND INTRAOCULAR LENS PLACEMENT LEFT EYE (CDE: 14.36);  Surgeon: [name], MD;  Location: [facility];  Service: Ophthalmology;  Laterality: Left;   CERVICAL FUSION  1990's   COLON SURGERY     COLONOSCOPY WITH PROPOFOL N/A 10/02/2021   Procedure: COLONOSCOPY WITH PROPOFOL;  Surgeon: Eloise Harman, DO;  [facility];  Service: Endoscopy;  Laterality: N/A;  11:45am   ESOPHAGOGASTRODUODENOSCOPY (EGD) WITH PROPOFOL N/A 10/02/2021   Procedure: ESOPHAGOGASTRODUODENOSCOPY (EGD) WITH PROPOFOL;  Surgeon: Eloise Harman, DO;  [facility];  Service: Endoscopy;  Laterality: N/A;   FOOT SURGERY Left    reshaped   HERNIA REPAIR     HIP ARTHROPLASTY Left    JOINT REPLACEMENT Right    hip   LUMBAR SPINE SURGERY     REPLACEMENT TOTAL KNEE Right    THYROIDECTOMY, PARTIAL     TOTAL KNEE ARTHROPLASTY Left 08/21/2016   Procedure: LEFT TOTAL KNEE ARTHROPLASTY;  Surgeon: Renette Butters, MD;  Location: [place];  Service: Orthopedics;  Laterality: Left;    Family History  Problem Relation Age of Onset   Colon cancer Neg Hx    Colon  polyps Neg Hx    Social History   Tobacco Use   Smoking status: Never   Smokeless tobacco: Never  Vaping Use   Vaping Use: Never used  Substance Use Topics   Alcohol use: No   Drug use: No    Allergies  Allergen Reactions   Carvedilol Other (See Comments)    Weaknesses/shortness of breath/dyspepsia/cramps   Sulfamethoxazole Swelling    Lips swell and feel numb    Current Meds  Medication Sig   ALPRAZolam (XANAX) 0.25 MG tablet Take 0.25 mg by mouth daily as needed for anxiety.   amLODipine (NORVASC) 2.5 MG tablet Take 2.5 mg by mouth daily.   aspirin EC 81 MG tablet Take 81 mg by mouth daily. Swallow whole.   benzonatate (TESSALON) 200 MG capsule Take 1 capsule by mouth three times daily as needed for cough   Biotin w/ Vitamins C & E (HAIR/SKIN/NAILS PO) Take 3 tablets by mouth daily. Gummy   carboxymethylcellulose (REFRESH PLUS) 0.5 % SOLN 1-2 drops 3 (three) times daily as needed (dry/irritated eyes).   chlorthalidone (HYGROTON) 25 MG tablet Take 25 mg by mouth as needed.   Cholecalciferol 125 MCG (5000 UT) TABS Take 1 tablet by mouth daily.   cloNIDine (CATAPRES) 0.1 MG tablet Take 0.1 mg by mouth daily as needed (diastolic blood pressure >10.).   diclofenac (VOLTAREN) 75 MG EC tablet Take 75 mg by mouth in the morning and at bedtime.   docusate sodium (COLACE) 100 MG capsule Take 300 mg by mouth in the morning.   donepezil (ARICEPT) 5 MG tablet Take 5 mg by mouth at bedtime.   DULoxetine (CYMBALTA) 60 MG capsule Take 60 mg by mouth every morning.   famotidine (PEPCID) 20 MG tablet One after supper   ferrous sulfate 325 (65 FE) MG tablet Take 1 tablet (325 mg total) by mouth daily.   GEMTESA 75 MG TABS Take 1 tablet by mouth daily.   hydrALAZINE (APRESOLINE) 50 MG tablet Take 1 tablet (50 mg total) by mouth 2 (two) times daily.   HYDROcodone-acetaminophen (NORCO/VICODIN) 5-325 MG tablet Take 1 tablet by mouth 2 (two) times daily as needed for moderate pain.    levothyroxine (SYNTHROID) 50 MCG tablet Take 50 mcg by mouth daily before breakfast.    losartan (COZAAR) 50 MG tablet Take 50 mg by mouth every evening.   Magnesium 250 MG TABS Take 250 mg by mouth daily.   nebivolol (BYSTOLIC) 10 MG tablet Take 1 tablet (10 mg total) by mouth at bedtime.   pantoprazole (PROTONIX) 40 MG tablet Take 1 tablet (40 mg total) by mouth daily. Take 30-60 min before first meal of the day   pravastatin (PRAVACHOL) 40 MG tablet TAKE ONE TABLET BY MOUTH EVERY EVENING   predniSONE (DELTASONE) 10 MG tablet Take  4  each am x 2 days,   2 each am x 2 days,  1 each am x 2 days and stop   traMADol (ULTRAM) 50 MG tablet Take 50 mg by mouth 2 (two) times daily as needed (pain.).    Objective: BP (!) 169/91   Pulse 74   Ht '4\' 11"'$  (1.499 m)   Wt 224 lb (101.6 kg)   BMI 45.24 kg/m   Physical Exam:  General: Elderly female., Alert and oriented., and No acute distress. Gait: Slow, steady gait.  Right wrist and the right wrist demonstrates some swelling.  Some bruising is appreciated.  Tenderness to palpation of the distal radius.  She is able to make a full fist, with slight loss of dexterity in the small and ring finger.  Slightly decreased sensation in these fingers.  Otherwise, fingers are warm and well-perfused.  IMAGING: I personally reviewed images previously obtained from the ED  X-rays from the urgent care center demonstrates a distal radius fracture, with mild dorsal angulation.  On the lateral projection, the joint line is neutral.  Chronic changes are appreciated within the scaphoid, as well as the [facility] joint.  Mild widening of the SL interval.   New Medications:  No orders of the defined types were placed in this encounter.     Mordecai Rasmussen, MD  05/14/2022 10:25 AM

## 2022-05-14 NOTE — Patient Instructions (Signed)
General Cast Instructions  1.  You were placed in a cast in clinic today.  Please keep the cast material clean, dry and intact.  Please do not use anything to itch the under the cast.  If it gets itchy, you can consider taking benadryl, or similar medication.  If the cast material gets wet, place it on a towel and use a hair dryer on a low setting. 2.  Tylenol or Ibuprofen/Naproxen as needed.   3.  Recommend elevating your extremity as much as possible to help with swelling. 4.  F/u 3 weeks, cast off and repeat XR   Follow up 06/06/22 in [place]  With the cast before then, please try and contact the clinic to be seen on 05/25/2022

## 2022-05-16 ENCOUNTER — Other Ambulatory Visit: Payer: Self-pay | Admitting: Internal Medicine

## 2022-05-24 DIAGNOSIS — M4015 Other secondary kyphosis, thoracolumbar region: Secondary | ICD-10-CM | POA: Diagnosis not present

## 2022-06-06 ENCOUNTER — Ambulatory Visit (INDEPENDENT_AMBULATORY_CARE_PROVIDER_SITE_OTHER): Payer: PPO

## 2022-06-06 ENCOUNTER — Ambulatory Visit (INDEPENDENT_AMBULATORY_CARE_PROVIDER_SITE_OTHER): Payer: PPO | Admitting: Orthopedic Surgery

## 2022-06-06 ENCOUNTER — Encounter: Payer: Self-pay | Admitting: Orthopedic Surgery

## 2022-06-06 VITALS — Ht 59.0 in | Wt 224.0 lb

## 2022-06-06 DIAGNOSIS — S52531D Colles' fracture of right radius, subsequent encounter for closed fracture with routine healing: Secondary | ICD-10-CM

## 2022-06-06 NOTE — Progress Notes (Signed)
Return patient Visit  Assessment: [name] is a 77 y.o. female with the following: 1. Closed Colles' fracture of right radius, subsequent encounter  Plan: ENEDELIA MARTORELLI fell on an outstretched right hand, and sustained a distal radius fracture.  Injury was sustained approximately 1 month ago.  Upon removal of the cast, she is already tolerating gentle range of motion.  Radiographs demonstrate some mild subsidence of the fracture compared to prior x-rays.  Otherwise, she is healing very well.  I am very impressed with her current tolerance, pain, and range of motion.  As such, we will transition her to a removable wrist brace.  She can remove this for hygiene.  Otherwise, I want her to use this at all times.  In 2 weeks, she can start to remove the brace to work on range of motion.  Limited lifting at this time.  I will see her back in 1 month.    Follow-up: Return in about 4 weeks (around 07/04/2022).  Subjective:  Chief Complaint  Patient presents with   Fracture    R wrist DOI 05/09/22    History of Present Illness: [name] is a 77 y.o. RHD female who returns for evaluation of right wrist pain.  She fell and sustained a distal radius fracture approximately 1 month ago.  She has been in a cast for the past 3 weeks.  She has done well.  Limited pain.  She does have some occasional pain in the forearm.  She also notes that some burning, shooting pains with associated numbness on the ulnar side of her hand has improved.   Review of Systems: No fevers or chills No numbness or tingling No chest pain No shortness of breath No bowel or bladder dysfunction No GI distress No headaches    Objective: Ht '4\' 11"'$  (1.499 m)   Wt 224 lb (101.6 kg)   BMI 45.24 kg/m   Physical Exam:  General: Elderly female., Alert and oriented., and No acute distress. Gait: Slow, steady gait.  Evaluation the right wrist demonstrates mild deformity.  Mild swelling is appreciated.  No  bruising.  She has a 60 degree arc of motion in flexion extension, with minimal pain.  She is able to make a full fist.  Fingers warm well-perfused.  IMAGING: I personally reviewed images previously obtained from the ED  X-rays were obtained in clinic today.  These were compared to prior x-rays.  There has been mild subsidence of the fracture, compared to prior x-rays.  This is most notable on the lateral projection.  Mild loss of radial height.  Joint spaces maintained.  Impression: Right distal radius fracture in stable alignment.  New Medications:  No orders of the defined types were placed in this encounter.     Mordecai Rasmussen, MD  06/06/2022 3:10 PM

## 2022-06-06 NOTE — Patient Instructions (Signed)
Brace at all times for 2 weeks, ok to remove for hygiene  In 2 weeks, can start to remove the brace and focus on range of motion only

## 2022-06-06 NOTE — Addendum Note (Signed)
Addended by: Elizabeth Sauer on: 06/06/2022 03:27 PM   Modules accepted: Orders

## 2022-06-24 DIAGNOSIS — M4015 Other secondary kyphosis, thoracolumbar region: Secondary | ICD-10-CM | POA: Diagnosis not present

## 2022-06-26 ENCOUNTER — Telehealth: Payer: Self-pay | Admitting: Cardiology

## 2022-06-26 NOTE — Telephone Encounter (Signed)
Pt states she has some sinus issues and would like to switch her appt on 06/28/22, to virtual tele visit, please advise if this is okay or if she needs to r/s next OV

## 2022-06-28 ENCOUNTER — Ambulatory Visit: Payer: PPO | Attending: Cardiology | Admitting: Cardiology

## 2022-06-28 ENCOUNTER — Encounter: Payer: Self-pay | Admitting: Cardiology

## 2022-06-28 VITALS — BP 145/90 | HR 87 | Ht 59.0 in | Wt 223.4 lb

## 2022-06-28 DIAGNOSIS — R0602 Shortness of breath: Secondary | ICD-10-CM

## 2022-06-28 DIAGNOSIS — R002 Palpitations: Secondary | ICD-10-CM | POA: Diagnosis not present

## 2022-06-28 DIAGNOSIS — E782 Mixed hyperlipidemia: Secondary | ICD-10-CM

## 2022-06-28 DIAGNOSIS — I10 Essential (primary) hypertension: Secondary | ICD-10-CM

## 2022-06-28 NOTE — Patient Instructions (Signed)
Medication Instructions:  Your physician recommends that you continue on your current medications as directed. Please refer to the Current Medication list given to you today.   Labwork: None  Testing/Procedures: None  Follow-Up: Follow up with Dr. Branch in 6 months.   Any Other Special Instructions Will Be Listed Below (If Applicable).     If you need a refill on your cardiac medications before your next appointment, please call your pharmacy.  

## 2022-06-28 NOTE — Progress Notes (Signed)
Face to face encounter, inaccurately labeled as virtual visit.   Clinical Summary [name] is a 77 y.o.female seen today for follow up of the following mediacl problems.        1.SOB/hoarseness - recent SOB x 6 months, progressing - SOB with talking, exertion. DOE just walking room to room at home - coarse feeling in her throat/hoarseness. No specific wheezing. Some cough at times - throat feeling and breathing improved with breztri.  - occaiosnal LE edema. Some chest tighness at times, gets better with rest and improves with inhaler - no prior smoking history. Some 2nd and smoke exposure.  - hoarseness about 2 months intermittently. Ca have feeling of pills getting stuck, some food getting stuck.    - 11/2021 PFTs: minimal obstruction, +airtrapping.  - seen by ENT, had some vocal chord changes due to reflux. Now on 2 antacids pills.  - SOB also improved.      2. LE edema - takes prn chlortahldione about once a week - 2-3 weeks weight gain about 14 lbs.    -no recent troubles.  - reports has prn chlorthalidone.    3. HTN - off HCTZ due to low Na during Jan 2021 admission for TIA.    - home bp's typically 120s/80s     4. TIA - admission Jan 2021 -on ASA, statin   5. Hyperlipidemia - 09/2019 TC 139 TG 54 HDL 72 LDL 55 - 03/2020 TC 136 TG 49 HDL 74 LDL 51 - compliant with statin   12/2020 TC 149 TG 53 HDL 92 LDL 46  - has been taking atorvasting every other day due to muscle cramps   - last visit lowered atorva to '40mg'$  qod due to muscle aches -ongoing muscle aches  04/2022 TC 177 TG 84 HDL 88 LDL 74   6. OSA - she is on cpap - last sleep study in 2021   7. Palpitations - can have some palpitations, 3-4 times a week. Lasts up to 5-15 minutes   -denies heavy caffine or EtOH   Jan 2022 monitor frequent PACS, rare PVCs - no significant issues   -no recent palpitations.  Past Medical History:  Diagnosis Date   Anemia    Anxiety    Arthritis     Complication of anesthesia    "pt. holds her breath when waking up"   Depression    GERD (gastroesophageal reflux disease)    Hypercholesteremia    Hypertension    Hypothyroidism    Osteoporosis    PONV (postoperative nausea and vomiting)    Staph infection    abdomen and returned 5 yrs. later   TIA (transient ischemic attack)      Allergies  Allergen Reactions   Carvedilol Other (See Comments)    Weaknesses/shortness of breath/dyspepsia/cramps   Sulfamethoxazole Swelling    Lips swell and feel numb     Current Outpatient Medications  Medication Sig Dispense Refill   ALPRAZolam (XANAX) 0.25 MG tablet Take 0.25 mg by mouth daily as needed for anxiety.     amLODipine (NORVASC) 2.5 MG tablet Take 2.5 mg by mouth daily.     aspirin EC 81 MG tablet Take 81 mg by mouth daily. Swallow whole.     benzonatate (TESSALON) 200 MG capsule Take 1 capsule by mouth three times daily as needed for cough 30 capsule 0   Biotin w/ Vitamins C & E (HAIR/SKIN/NAILS PO) Take 3 tablets by mouth daily. Gummy     carboxymethylcellulose (  REFRESH PLUS) 0.5 % SOLN 1-2 drops 3 (three) times daily as needed (dry/irritated eyes).     chlorthalidone (HYGROTON) 25 MG tablet Take 25 mg by mouth as needed.     Cholecalciferol 125 MCG (5000 UT) TABS Take 1 tablet by mouth daily.     cloNIDine (CATAPRES) 0.1 MG tablet Take 0.1 mg by mouth daily as needed (diastolic blood pressure >75.).     diclofenac (VOLTAREN) 75 MG EC tablet Take 75 mg by mouth in the morning and at bedtime.     docusate sodium (COLACE) 100 MG capsule Take 300 mg by mouth in the morning.     donepezil (ARICEPT) 5 MG tablet Take 5 mg by mouth at bedtime.     DULoxetine (CYMBALTA) 60 MG capsule Take 60 mg by mouth every morning.     famotidine (PEPCID) 20 MG tablet One after supper 30 tablet 11   ferrous sulfate 325 (65 FE) MG tablet Take 1 tablet (325 mg total) by mouth daily. 30 tablet 3   GEMTESA 75 MG TABS Take 1 tablet by mouth daily.      hydrALAZINE (APRESOLINE) 50 MG tablet Take 1 tablet (50 mg total) by mouth 2 (two) times daily. 180 tablet 1   HYDROcodone-acetaminophen (NORCO/VICODIN) 5-325 MG tablet Take 1 tablet by mouth 2 (two) times daily as needed for moderate pain. 10 tablet 0   levothyroxine (SYNTHROID) 50 MCG tablet Take 50 mcg by mouth daily before breakfast.      losartan (COZAAR) 50 MG tablet Take 50 mg by mouth every evening.     Magnesium 250 MG TABS Take 250 mg by mouth daily.     nebivolol (BYSTOLIC) 10 MG tablet Take 1 tablet (10 mg total) by mouth at bedtime. 30 tablet 3   omeprazole (PRILOSEC) 20 MG capsule Take 20 mg by mouth every morning.     pravastatin (PRAVACHOL) 40 MG tablet TAKE ONE TABLET BY MOUTH EVERY EVENING 90 tablet 3   traMADol (ULTRAM) 50 MG tablet Take 50 mg by mouth 2 (two) times daily as needed (pain.).     No current facility-administered medications for this visit.     Past Surgical History:  Procedure Laterality Date   ABDOMINAL HYSTERECTOMY     BIOPSY  10/02/2021   Procedure: BIOPSY;  Surgeon: Eloise Harman, DO;  Location: [facility];  Service: Endoscopy;;   CATARACT EXTRACTION W/PHACO Right 01/22/2019   Procedure: CATARACT EXTRACTION PHACO AND INTRAOCULAR LENS PLACEMENT (IOC);  Surgeon: [name], MD;  Location: [facility];  Service: Ophthalmology;  Laterality: Right;  right, CDE: 19.63   CATARACT EXTRACTION W/PHACO Left 02/16/2019   Procedure: CATARACT EXTRACTION PHACO AND INTRAOCULAR LENS PLACEMENT LEFT EYE (CDE: 14.36);  Surgeon: [name], MD;  Location: [facility];  Service: Ophthalmology;  Laterality: Left;   CERVICAL FUSION  1990's   COLON SURGERY     COLONOSCOPY WITH PROPOFOL N/A 10/02/2021   Procedure: COLONOSCOPY WITH PROPOFOL;  Surgeon: Eloise Harman, DO;  Location: [facility];  Service: Endoscopy;  Laterality: N/A;  11:45am   ESOPHAGOGASTRODUODENOSCOPY (EGD) WITH PROPOFOL N/A 10/02/2021   Procedure: ESOPHAGOGASTRODUODENOSCOPY (EGD) WITH PROPOFOL;  Surgeon:  Eloise Harman, DO;  Location: [facility];  Service: Endoscopy;  Laterality: N/A;   FOOT SURGERY Left    reshaped   HERNIA REPAIR     HIP ARTHROPLASTY Left    JOINT REPLACEMENT Right    hip   LUMBAR SPINE SURGERY     REPLACEMENT TOTAL KNEE Right  THYROIDECTOMY, PARTIAL     TOTAL KNEE ARTHROPLASTY Left 08/21/2016   Procedure: LEFT TOTAL KNEE ARTHROPLASTY;  Surgeon: Renette Butters, MD;  Location: [place];  Service: Orthopedics;  Laterality: Left;     Allergies  Allergen Reactions   Carvedilol Other (See Comments)    Weaknesses/shortness of breath/dyspepsia/cramps   Sulfamethoxazole Swelling    Lips swell and feel numb      Family History  Problem Relation Age of Onset   Colon cancer Neg Hx    Colon polyps Neg Hx      Social History [name] reports that she has never smoked. She has never used smokeless tobacco. [name] reports no history of alcohol use.   Review of Systems CONSTITUTIONAL: No weight loss, fever, chills, weakness or fatigue.  HEENT: Eyes: No visual loss, blurred vision, double vision or yellow sclerae.No hearing loss, sneezing, congestion, runny nose or sore throat.  SKIN: No rash or itching.  CARDIOVASCULAR: per hpi RESPIRATORY: No shortness of breath, cough or sputum.  GASTROINTESTINAL: No anorexia, nausea, vomiting or diarrhea. No abdominal pain or blood.  GENITOURINARY: No burning on urination, no polyuria NEUROLOGICAL: No headache, dizziness, syncope, paralysis, ataxia, numbness or tingling in the extremities. No change in bowel or bladder control.  MUSCULOSKELETAL: No muscle, back pain, joint pain or stiffness.  LYMPHATICS: No enlarged nodes. No history of splenectomy.  PSYCHIATRIC: No history of depression or anxiety.  ENDOCRINOLOGIC: No reports of sweating, cold or heat intolerance. No polyuria or polydipsia.  Marland Kitchen   Physical Examination Vitals:   06/28/22 1539 06/28/22 1650  BP: (!) 158/84 (!) 145/90  Pulse: 87   SpO2: 95%     Filed Weights   06/28/22 1539  Weight: 223 lb 6.4 oz (101.3 kg)    Gen: resting comfortably, no acute distress HEENT: no scleral icterus, pupils equal round and reactive, no palptable cervical adenopathy,  CV: RRR, no m/r/g no jvd Resp: Clear to auscultation bilaterally GI: abdomen is soft, non-tender, non-distended, normal bowel sounds, no hepatosplenomegaly MSK: extremities are warm, no edema.  Skin: warm, no rash Neuro:  no focal deficits Psych: appropriate affect   Diagnostic Studies  03/2017 echo Study Conclusions   - Left ventricle: The cavity size was normal. Systolic function was   normal. The estimated ejection fraction was in the range of 55%   to 60%. Wall motion was normal; there were no regional wall   motion abnormalities. Doppler parameters are consistent with   abnormal left ventricular relaxation (grade 1 diastolic   dysfunction). - Mitral valve: There was moderate regurgitation. - Atrial septum: No defect or patent foramen ovale was identified. - Tricuspid valve: There was mild regurgitation. - Pulmonic valve: There was mild regurgitation. - Pulmonary arteries: PA peak pressure: 41 mm Hg (S). - Systemic veins: The IVC was suboptimally visualized, but appeared   dilated. There was normal respiratory variation. Estimated RAP 8   mmHg.     Jan 2022 7 day zio patch 7 day monitor Min HR 55, Max HR 119, Avg HR 69 Frequent supraventricular ectopy in the form of isolated PACs, couplets, triplets Rare ventricular ectopy in the form of isolated PVCs. Reported symptoms correlated with sinus rhythm with PACs   Assessment and Plan    1.SOB/hoarseness -improved following ENT evaluation,no further cardiac workup planned     2. Hyperlipidemia -stopped atorvastattin due to muscle aches, tolerating pravastatin. Continue current therapy, LDL essentially at goal. If uptrend room to titrate pravastatin.     3.Palpitations -  no recent symptoms, continue to  monitor      4. HTN - elevated here but home numbers at goal, continue current meds     Signed, Carlyle Dolly, MD  06/28/2022 4:50 PM    [place]

## 2022-07-04 ENCOUNTER — Encounter: Payer: PPO | Admitting: Orthopedic Surgery

## 2022-07-25 ENCOUNTER — Ambulatory Visit (INDEPENDENT_AMBULATORY_CARE_PROVIDER_SITE_OTHER): Payer: PPO | Admitting: Orthopedic Surgery

## 2022-07-25 ENCOUNTER — Ambulatory Visit (INDEPENDENT_AMBULATORY_CARE_PROVIDER_SITE_OTHER): Payer: PPO

## 2022-07-25 ENCOUNTER — Encounter: Payer: PPO | Admitting: Orthopedic Surgery

## 2022-07-25 ENCOUNTER — Encounter: Payer: Self-pay | Admitting: Orthopedic Surgery

## 2022-07-25 VITALS — Ht 59.0 in | Wt 223.0 lb

## 2022-07-25 DIAGNOSIS — S52531D Colles' fracture of right radius, subsequent encounter for closed fracture with routine healing: Secondary | ICD-10-CM | POA: Diagnosis not present

## 2022-07-25 DIAGNOSIS — M4015 Other secondary kyphosis, thoracolumbar region: Secondary | ICD-10-CM | POA: Diagnosis not present

## 2022-07-26 NOTE — Progress Notes (Signed)
Return patient Visit  Assessment: [name] is a 77 y.o. female with the following: 1. Closed Colles' fracture of right radius, subsequent encounter  Plan: [name] fell on an outstretched right hand, and sustained a distal radius fracture.  Injury was sustained approximately 6 weeks ago.  Radiographs are stable.  Her motion is improving.  She has no pain.  She is pleased with her progress.  She is having some other complaints at this time, and these can be addressed in the future.  She will contact clinic if she would like another appointment.   Follow-up: Return if symptoms worsen or fail to improve.  Subjective:  Chief Complaint  Patient presents with   Fracture    Rt wrist DOI 05/09/22    History of Present Illness: [name] is a 77 y.o. RHD female who returns for evaluation of right wrist pain.  She fell and sustained a distal radius fracture approximately 6 weeks ago.  She was immobilized for 3 weeks.  She used the brace for short period of time.  She has been working on range of motion.  She is doing well overall.  Review of Systems: No fevers or chills No numbness or tingling No chest pain No shortness of breath No bowel or bladder dysfunction No GI distress No headaches    Objective: Ht 4' 11"$  (1.499 m)   Wt 223 lb (101.2 kg)   BMI 45.04 kg/m   Physical Exam:  General: Elderly female., Alert and oriented., and No acute distress. Gait: Slow, steady gait.  Right wrist without swelling.  No bruising.  Mild deformity.  She has good range of motion of the wrist and hand.  No tenderness to palpation.  Sensation intact throughout the hand. IMAGING: I personally reviewed images previously obtained from the ED  X-rays were obtained in clinic today.  No acute injuries are noted.  Distal radius fracture, with slight dorsal impaction, and dorsal angulation of the joint line remains in unchanged position.  There has been some interval consolidation.   No interval displacement.  Impression: Healed right distal radius fracture, with dorsal angulation  New Medications:  No orders of the defined types were placed in this encounter.     Mordecai Rasmussen, MD  07/26/2022 8:55 PM

## 2022-08-02 ENCOUNTER — Encounter: Payer: Self-pay | Admitting: Radiology

## 2022-08-08 ENCOUNTER — Encounter: Payer: PPO | Admitting: Orthopedic Surgery

## 2022-08-23 DIAGNOSIS — M4015 Other secondary kyphosis, thoracolumbar region: Secondary | ICD-10-CM | POA: Diagnosis not present

## 2022-09-05 DIAGNOSIS — M546 Pain in thoracic spine: Secondary | ICD-10-CM | POA: Diagnosis not present

## 2022-09-05 DIAGNOSIS — M9902 Segmental and somatic dysfunction of thoracic region: Secondary | ICD-10-CM | POA: Diagnosis not present

## 2022-09-05 DIAGNOSIS — M9903 Segmental and somatic dysfunction of lumbar region: Secondary | ICD-10-CM | POA: Diagnosis not present

## 2022-09-05 DIAGNOSIS — M9905 Segmental and somatic dysfunction of pelvic region: Secondary | ICD-10-CM | POA: Diagnosis not present

## 2022-09-05 DIAGNOSIS — M62830 Muscle spasm of back: Secondary | ICD-10-CM | POA: Diagnosis not present

## 2022-09-07 DIAGNOSIS — M62830 Muscle spasm of back: Secondary | ICD-10-CM | POA: Diagnosis not present

## 2022-09-07 DIAGNOSIS — M9903 Segmental and somatic dysfunction of lumbar region: Secondary | ICD-10-CM | POA: Diagnosis not present

## 2022-09-07 DIAGNOSIS — M9905 Segmental and somatic dysfunction of pelvic region: Secondary | ICD-10-CM | POA: Diagnosis not present

## 2022-09-07 DIAGNOSIS — M9902 Segmental and somatic dysfunction of thoracic region: Secondary | ICD-10-CM | POA: Diagnosis not present

## 2022-09-07 DIAGNOSIS — M546 Pain in thoracic spine: Secondary | ICD-10-CM | POA: Diagnosis not present

## 2022-09-23 DIAGNOSIS — M4015 Other secondary kyphosis, thoracolumbar region: Secondary | ICD-10-CM | POA: Diagnosis not present

## 2022-09-26 ENCOUNTER — Telehealth: Payer: Self-pay | Admitting: Cardiology

## 2022-09-26 NOTE — Telephone Encounter (Signed)
Pt c/o BP issue: STAT if pt c/o blurred vision, one-sided weakness or slurred speech  1. What are your last 5 BP readings? 160/73: 163/77: 138/66; 158/78; 199/87  2. Are you having any other symptoms (ex. Dizziness, headache, blurred vision, passed out)? dizziness  3. What is your BP issue? Running high

## 2022-09-27 NOTE — Telephone Encounter (Signed)
Verify taking norvasc 2.5mg  daily, if so increase to  daily and update [place] 1 week on bp's  Dominga Ferry MD

## 2022-09-27 NOTE — Telephone Encounter (Signed)
Spoke with pt who states that her BP started to increase over the weekend. Pt states that she had one episode of dizziness. No other complaints at this time. Pt reports taking all medications as prescribed. Please advise.

## 2022-09-27 NOTE — Telephone Encounter (Signed)
Returned call to pt, no answer. Left msg to call back.  ?

## 2022-09-27 NOTE — Telephone Encounter (Signed)
Patient is returning call. Transferred to [place], LPN.

## 2022-09-28 MED ORDER — AMLODIPINE BESYLATE 5 MG PO TABS: 5.0000 mg | ORAL_TABLET | Freq: Every day | ORAL | 3 refills | Status: DC

## 2022-09-28 NOTE — Addendum Note (Signed)
Addended by: Marlyn Corporal A on: 09/28/2022 08:13 AM   Modules accepted: Orders

## 2022-09-28 NOTE — Telephone Encounter (Signed)
Patient clarified dose of norvac is 2.5 mg qd, she will increase to 5 mg qd and call back in 1 week with BP update.

## 2022-10-23 DIAGNOSIS — M1990 Unspecified osteoarthritis, unspecified site: Secondary | ICD-10-CM | POA: Diagnosis not present

## 2022-10-23 DIAGNOSIS — Z0000 Encounter for general adult medical examination without abnormal findings: Secondary | ICD-10-CM | POA: Diagnosis not present

## 2022-10-23 DIAGNOSIS — F329 Major depressive disorder, single episode, unspecified: Secondary | ICD-10-CM | POA: Diagnosis not present

## 2022-10-23 DIAGNOSIS — E782 Mixed hyperlipidemia: Secondary | ICD-10-CM | POA: Diagnosis not present

## 2022-10-23 DIAGNOSIS — Z111 Encounter for screening for respiratory tuberculosis: Secondary | ICD-10-CM | POA: Diagnosis not present

## 2022-10-23 DIAGNOSIS — E039 Hypothyroidism, unspecified: Secondary | ICD-10-CM | POA: Diagnosis not present

## 2022-10-23 DIAGNOSIS — G3184 Mild cognitive impairment, so stated: Secondary | ICD-10-CM | POA: Diagnosis not present

## 2022-10-23 DIAGNOSIS — M4015 Other secondary kyphosis, thoracolumbar region: Secondary | ICD-10-CM | POA: Diagnosis not present

## 2022-10-23 DIAGNOSIS — I10 Essential (primary) hypertension: Secondary | ICD-10-CM | POA: Diagnosis not present

## 2022-10-23 DIAGNOSIS — K219 Gastro-esophageal reflux disease without esophagitis: Secondary | ICD-10-CM | POA: Diagnosis not present

## 2022-10-23 DIAGNOSIS — E559 Vitamin D deficiency, unspecified: Secondary | ICD-10-CM | POA: Diagnosis not present

## 2022-10-23 DIAGNOSIS — R0609 Other forms of dyspnea: Secondary | ICD-10-CM | POA: Diagnosis not present

## 2022-11-23 DIAGNOSIS — M4015 Other secondary kyphosis, thoracolumbar region: Secondary | ICD-10-CM | POA: Diagnosis not present

## 2022-11-27 ENCOUNTER — Other Ambulatory Visit: Payer: Self-pay

## 2022-11-27 ENCOUNTER — Encounter: Payer: Self-pay | Admitting: Emergency Medicine

## 2022-11-27 ENCOUNTER — Ambulatory Visit
Admission: EM | Admit: 2022-11-27 | Discharge: 2022-11-27 | Disposition: A | Discharge status: Home / Self Care | Payer: PPO | Attending: Nurse Practitioner | Admitting: Nurse Practitioner

## 2022-11-27 DIAGNOSIS — E871 Hypo-osmolality and hyponatremia: Secondary | ICD-10-CM | POA: Diagnosis not present

## 2022-11-27 DIAGNOSIS — R399 Unspecified symptoms and signs involving the genitourinary system: Secondary | ICD-10-CM | POA: Insufficient documentation

## 2022-11-27 DIAGNOSIS — N898 Other specified noninflammatory disorders of vagina: Secondary | ICD-10-CM | POA: Insufficient documentation

## 2022-11-27 LAB — POCT URINALYSIS DIP (MANUAL ENTRY)
Bilirubin, UA: NEGATIVE
Blood, UA: NEGATIVE
Glucose, UA: NEGATIVE mg/dL
Ketones, POC UA: NEGATIVE mg/dL
Nitrite, UA: NEGATIVE
Protein Ur, POC: 30 mg/dL — AB
Spec Grav, UA: 1.02 (ref 1.010–1.025)
Urobilinogen, UA: 0.2 E.U./dL
pH, UA: 7 (ref 5.0–8.0)

## 2022-11-27 MED ORDER — CEPHALEXIN 500 MG PO CAPS: 500.0000 mg | ORAL_CAPSULE | Freq: Two times a day (BID) | ORAL | 0 refills | Status: AC

## 2022-11-27 NOTE — Discharge Instructions (Signed)
The urine sample today shows a large amount of white blood cells.  This could be coming from vaginal discharge or from a UTI.  We are treating you for UTI with Keflex twice daily for 5 days-take this as prescribed to treat the UTI.  Increase hydration with plenty of fluids like water.  Try to avoid caffeine.  We are also testing the vaginal discharge for a yeast infection or possible BV.  We will contact you if this is positive in a couple of days.  Will also contact you if the urine culture shows we need to change the antibiotic.

## 2022-11-27 NOTE — ED Provider Notes (Signed)
[facility]    CSN: [id number] Arrival date & time: 11/27/22  1108      History   Chief Complaint Chief Complaint  Patient presents with   Dysuria    HPI [name] is a 77 y.o. female.   Patient presents today with 3-week history of dysuria, "milky" urine, bilateral groin pain, and history of vaginal itching a couple of weeks ago that is now resolved.  She denies new urinary frequency or urgency, new urinary incontinence, hematuria, new back pain, suprapubic pain or pressure, flank pain, fever, nausea/vomiting, and current vaginal itching.  Patient denies recent sexual activity.  Has not taken anything for symptoms so far.  Reports history of UTI more than 1 year ago.  Has not taken antibiotics in the past 90 days.  Reports she has "yeast" in her abdominal folds and under bilateral that she is currently treating with a prescription by PCP.    Past Medical History:  Diagnosis Date   Anemia    Anxiety    Arthritis    Complication of anesthesia    "pt. holds her breath when waking up"   Depression    GERD (gastroesophageal reflux disease)    Hypercholesteremia    Hypertension    Hypothyroidism    Osteoporosis    PONV (postoperative nausea and vomiting)    Staph infection    abdomen and returned 5 yrs. later   TIA (transient ischemic attack)     Patient Active Problem List   Diagnosis Date Noted   Upper airway cough syndrome 02/12/2022   DOE (dyspnea on exertion) 02/12/2022   Morbid obesity due to excess calories (HCC) 02/12/2022   Positive colorectal cancer screening using Cologuard test 09/15/2021   Anemia 09/15/2021   COVID-19 virus infection 06/23/2019   Transient speech disturbance 06/22/2019   Hypothyroidism    Migraine equivalent 04/16/2017   Anxiety 04/16/2017   Palpitation 04/16/2017   Hyperlipidemia 04/16/2017   Essential hypertension 03/06/2017   Hypokalemia 03/06/2017   Hyponatremia 03/06/2017   TIA (transient ischemic attack)  03/05/2017   Primary localized osteoarthritis of left knee 08/22/2016   Paresthesia 08/10/2016   Weakness 08/10/2016   Depression 07/31/2016   S/P thyroidectomy 07/31/2016   Primary osteoarthritis of left knee 07/31/2016   S/P cervical spinal fusion 07/31/2016   S/P total knee arthroplasty, right 07/31/2016   SUBLUXATION, RADIAL HEAD 10/27/2009   CONTUSION, ELBOW 10/27/2009    Past Surgical History:  Procedure Laterality Date   ABDOMINAL HYSTERECTOMY     BIOPSY  10/02/2021   Procedure: BIOPSY;  Surgeon: Lanelle Bal, DO;  Location: [facility];  Service: Endoscopy;;   CATARACT EXTRACTION W/PHACO Right 01/22/2019   Procedure: CATARACT EXTRACTION PHACO AND INTRAOCULAR LENS PLACEMENT (IOC);  Surgeon: Fabio Pierce, MD;  Location: [facility];  Service: Ophthalmology;  Laterality: Right;  right, CDE: 19.63   CATARACT EXTRACTION W/PHACO Left 02/16/2019   Procedure: CATARACT EXTRACTION PHACO AND INTRAOCULAR LENS PLACEMENT LEFT EYE (CDE: 14.36);  Surgeon: Fabio Pierce, MD;  Location: [facility];  Service: Ophthalmology;  Laterality: Left;   CERVICAL FUSION  1990's   COLON SURGERY     COLONOSCOPY WITH PROPOFOL N/A 10/02/2021   Procedure: COLONOSCOPY WITH PROPOFOL;  Surgeon: Lanelle Bal, DO;  Location: [facility];  Service: Endoscopy;  Laterality: N/A;  11:45am   ESOPHAGOGASTRODUODENOSCOPY (EGD) WITH PROPOFOL N/A 10/02/2021   Procedure: ESOPHAGOGASTRODUODENOSCOPY (EGD) WITH PROPOFOL;  Surgeon: Lanelle Bal, DO;  Location: [facility];  Service: Endoscopy;  Laterality: N/A;   FOOT SURGERY Left    reshaped   HERNIA REPAIR     HIP ARTHROPLASTY Left    JOINT REPLACEMENT Right    hip   LUMBAR SPINE SURGERY     REPLACEMENT TOTAL KNEE Right    THYROIDECTOMY, PARTIAL     TOTAL KNEE ARTHROPLASTY Left 08/21/2016   Procedure: LEFT TOTAL KNEE ARTHROPLASTY;  Surgeon: Sheral Apley, MD;  Location: [facility];  Service: Orthopedics;  Laterality: Left;    OB History   No obstetric history on  file.      Home Medications    Prior to Admission medications   Medication Sig Start Date End Date Taking? Authorizing Provider  cephALEXin (KEFLEX) 500 MG capsule Take 1 capsule (500 mg total) by mouth 2 (two) times daily for 5 days. 11/27/22 12/02/22 Yes Valentino Nose, NP  ALPRAZolam Prudy Feeler) 0.25 MG tablet Take 0.25 mg by mouth daily as needed for anxiety.    [provider]  amLODipine (NORVASC) 5 MG tablet Take 1 tablet (5 mg total) by mouth daily. 09/28/22 12/27/22  Antoine Poche, MD  aspirin EC 81 MG tablet Take 81 mg by mouth daily. Swallow whole.    [provider]  benzonatate (TESSALON) 200 MG capsule Take 1 capsule by mouth three times daily as needed for cough 04/06/22   Nyoka Cowden, MD  Biotin w/ Vitamins C & E (HAIR/SKIN/NAILS PO) Take 3 tablets by mouth daily. Gummy    [provider]  carboxymethylcellulose (REFRESH PLUS) 0.5 % SOLN 1-2 drops 3 (three) times daily as needed (dry/irritated eyes).    [provider]  chlorthalidone (HYGROTON) 25 MG tablet Take 25 mg by mouth as needed. 10/16/21   [provider]  Cholecalciferol 125 MCG (5000 UT) TABS Take 1 tablet by mouth daily.    [provider]  cloNIDine (CATAPRES) 0.1 MG tablet Take 0.1 mg by mouth daily as needed (diastolic blood pressure >90.).    [provider]  diclofenac (VOLTAREN) 75 MG EC tablet Take 75 mg by mouth in the morning and at bedtime. 02/10/21   [provider]  docusate sodium (COLACE) 100 MG capsule Take 300 mg by mouth in the morning.    [provider]  donepezil (ARICEPT) 5 MG tablet Take 5 mg by mouth at bedtime. 01/29/21   [provider]  DULoxetine (CYMBALTA) 60 MG capsule Take 60 mg by mouth every morning. 06/17/19   [provider]  famotidine (PEPCID) 20 MG tablet One after supper 02/12/22   Nyoka Cowden, MD  ferrous sulfate 325 (65 FE) MG tablet Take 1 tablet (325 mg total) by mouth  daily. 09/17/21 09/17/22  Letta Median, PA-C  GEMTESA 75 MG TABS Take 1 tablet by mouth daily. 11/27/21   [provider]  hydrALAZINE (APRESOLINE) 50 MG tablet Take 1 tablet (50 mg total) by mouth 2 (two) times daily. 11/10/20   Antoine Poche, MD  HYDROcodone-acetaminophen (NORCO/VICODIN) 5-325 MG tablet Take 1 tablet by mouth 2 (two) times daily as needed for moderate pain. 05/09/22   Particia Nearing, PA-C  levothyroxine (SYNTHROID) 50 MCG tablet Take 50 mcg by mouth daily before breakfast.  06/01/16   [provider]  losartan (COZAAR) 50 MG tablet Take 50 mg by mouth every evening. 02/24/21   [provider]  Magnesium 250 MG TABS Take 250 mg by mouth daily.    [provider]  nebivolol (BYSTOLIC) 10 MG tablet  Take 1 tablet (10 mg total) by mouth at bedtime. 06/23/19   Shon Hale, MD  omeprazole (PRILOSEC) 20 MG capsule Take 20 mg by mouth every morning. 03/22/22   [provider]  pravastatin (PRAVACHOL) 40 MG tablet TAKE ONE TABLET BY MOUTH EVERY EVENING 05/14/22   Antoine Poche, MD  traMADol (ULTRAM) 50 MG tablet Take 50 mg by mouth 2 (two) times daily as needed (pain.). 01/30/21   [provider]    Family History Family History  Problem Relation Age of Onset   Colon cancer Neg Hx    Colon polyps Neg Hx     Social History Social History   Tobacco Use   Smoking status: Never   Smokeless tobacco: Never  Vaping Use   Vaping Use: Never used  Substance Use Topics   Alcohol use: No   Drug use: No     Allergies   Carvedilol and Sulfamethoxazole   Review of Systems Review of Systems Per HPI  Physical Exam Triage Vital Signs ED Triage Vitals  Enc Vitals Group     BP 11/27/22 1119 132/71     Pulse Rate 11/27/22 1119 71     Resp 11/27/22 1119 20     Temp 11/27/22 1119 98.1 F (36.7 C)     Temp Source 11/27/22 1119 Oral     SpO2 11/27/22 1119 96 %     Weight --      Height --      Head  Circumference --      Peak Flow --      Pain Score 11/27/22 1118 1     Pain Loc --      Pain Edu? --      Excl. in GC? --    No data found.  Updated Vital Signs BP 132/71 (BP Location: Right Arm)   Pulse 71   Temp 98.1 F (36.7 C) (Oral)   Resp 20   SpO2 96%   Visual Acuity Right Eye Distance:   Left Eye Distance:   Bilateral Distance:    Right Eye Near:   Left Eye Near:    Bilateral Near:     Physical Exam Vitals and nursing note reviewed. Exam conducted with a chaperone present Esperanza Richters, RN).  Constitutional:      General: She is not in acute distress.    Appearance: She is not toxic-appearing.  HENT:     Mouth/Throat:     Mouth: Mucous membranes are moist.     Pharynx: Oropharynx is clear.  Cardiovascular:     Rate and Rhythm: Normal rate and regular rhythm.  Pulmonary:     Effort: Pulmonary effort is normal. No respiratory distress.  Abdominal:     General: Abdomen is flat. Bowel sounds are normal. There is no distension.     Palpations: Abdomen is soft. There is no mass.     Tenderness: There is no abdominal tenderness. There is no right CVA tenderness, left CVA tenderness or guarding.  Genitourinary:    Exam position: Lithotomy position.     Pubic Area: No rash or pubic lice.      Labia:        Right: No rash or tenderness.        Left: No rash or tenderness.      Vagina: Normal. No vaginal discharge or tenderness.  Lymphadenopathy:     Lower Body: No right inguinal adenopathy. No left inguinal adenopathy.  Skin:    General: Skin is  warm and dry.     Coloration: Skin is not jaundiced or pale.     Findings: No erythema.  Neurological:     Mental Status: She is alert and oriented to person, place, and time.     Motor: No weakness.     Gait: Gait normal.  Psychiatric:        Behavior: Behavior is cooperative.      UC Treatments / Results  Labs (all labs ordered are listed, but only abnormal results are displayed) Labs Reviewed  POCT  URINALYSIS DIP (MANUAL ENTRY) - Abnormal; Notable for the following components:      Result Value   Protein Ur, POC =30 (*)    Leukocytes, UA Large (3+) (*)    All other components within normal limits  URINE CULTURE  CERVICOVAGINAL ANCILLARY ONLY    EKG   Radiology No results found.  Procedures Procedures (including critical care time)  Medications Ordered in UC Medications - No data to display  Initial Impression / Assessment and Plan / UC Course  I have reviewed the triage vital signs and the nursing notes.  Pertinent labs & imaging results that were available during my care of the patient were reviewed by me and considered in my medical decision making (see chart for details).   Patient is well-appearing, normotensive, afebrile, not tachycardic, not tachypneic, oxygenating well on room air.    1. UTI symptoms 2. Vaginal itching Urinalysis today shows large leukocyte Estrace Urine culture is pending Will treat based on symptoms for acute UTI with Keflex twice daily for 5 days Last GFR greater than 60 Cytology swab is also pending to check for causes of possible acute vaginitis; examination today is unremarkable Strict ER precautions discussed with patient  The patient was given the opportunity to ask questions.  All questions answered to their satisfaction.  The patient is in agreement to this plan.    Final Clinical Impressions(s) / UC Diagnoses   Final diagnoses:  UTI symptoms  Vaginal itching     Discharge Instructions      The urine sample today shows a large amount of white blood cells.  This could be coming from vaginal discharge or from a UTI.  We are treating you for UTI with Keflex twice daily for 5 days-take this as prescribed to treat the UTI.  Increase hydration with plenty of fluids like water.  Try to avoid caffeine.  We are also testing the vaginal discharge for a yeast infection or possible BV.  We will contact you if this is positive in a couple  of days.  Will also contact you if the urine culture shows we need to change the antibiotic.     ED Prescriptions     Medication Sig Dispense Auth. Provider   cephALEXin (KEFLEX) 500 MG capsule Take 1 capsule (500 mg total) by mouth 2 (two) times daily for 5 days. 10 capsule Valentino Nose, NP      PDMP not reviewed this encounter.   Valentino Nose, NP 11/27/22 1246

## 2022-11-27 NOTE — ED Triage Notes (Signed)
Pt reports felt like I had the "flu for about three days a few weeks ago". Pt reports dysuria, urinary frequency, and intermittent nausea/diarrhea ever since. Pt reports diarrhea has slowed to 3 times per day but reports urine is "milky". Denies any known fevers, abd pain.

## 2022-11-30 LAB — CERVICOVAGINAL ANCILLARY ONLY
Bacterial Vaginitis (gardnerella): NEGATIVE
Candida Glabrata: NEGATIVE
Candida Vaginitis: NEGATIVE
Chlamydia: NEGATIVE
Comment: NEGATIVE
Comment: NEGATIVE
Comment: NEGATIVE
Comment: NEGATIVE
Comment: NEGATIVE
Comment: NORMAL
Neisseria Gonorrhea: NEGATIVE
Trichomonas: NEGATIVE

## 2022-11-30 LAB — URINE CULTURE: Culture: >=100000 — AB

## 2022-12-23 DIAGNOSIS — M4015 Other secondary kyphosis, thoracolumbar region: Secondary | ICD-10-CM | POA: Diagnosis not present

## 2023-01-08 ENCOUNTER — Ambulatory Visit: Payer: PPO | Attending: Cardiology | Admitting: Cardiology

## 2023-01-08 VITALS — BP 138/78 | HR 69 | Ht <= 58 in | Wt 229.8 lb

## 2023-01-08 DIAGNOSIS — R0602 Shortness of breath: Secondary | ICD-10-CM

## 2023-01-08 DIAGNOSIS — R002 Palpitations: Secondary | ICD-10-CM

## 2023-01-08 DIAGNOSIS — I10 Essential (primary) hypertension: Secondary | ICD-10-CM | POA: Diagnosis not present

## 2023-01-08 DIAGNOSIS — E782 Mixed hyperlipidemia: Secondary | ICD-10-CM | POA: Diagnosis not present

## 2023-01-08 DIAGNOSIS — G459 Transient cerebral ischemic attack, unspecified: Secondary | ICD-10-CM

## 2023-01-08 MED ORDER — PRAVASTATIN SODIUM 80 MG PO TABS: 80.0000 mg | ORAL_TABLET | Freq: Every day | ORAL | 1 refills | Status: DC

## 2023-01-08 NOTE — Patient Instructions (Signed)
Medication Instructions:  ?Increase Pravastatin to 80mg daily  ?Continue all other medications.    ? ?Labwork: ?none ? ?Testing/Procedures: ?none ? ?Follow-Up: ?6 months  ? ?Any Other Special Instructions Will Be Listed Below (If Applicable). ? ? ?If you need a refill on your cardiac medications before your next appointment, please call your pharmacy. ? ?

## 2023-01-08 NOTE — Progress Notes (Signed)
Clinical Summary Ms. Elfering is a 77 y.o.female seen today for follow up of the following mediacl problems.      1.SOB/hoarseness - recent SOB x 6 months, progressing - SOB with talking, exertion. DOE just walking room to room at home - coarse feeling in her throat/hoarseness. No specific wheezing. Some cough at times - throat feeling and breathing improved with breztri.  - occaiosnal LE edema. Some chest tighness at times, gets better with rest and improves with inhaler - no prior smoking history. Some 2nd and smoke exposure.  - hoarseness about 2 months intermittently. Ca have feeling of pills getting stuck, some food getting stuck.    - 11/2021 PFTs: minimal obstruction, +airtrapping.  - seen by ENT, hoarseness thought secondary to reflux. No worrisome findings on vocal cords. Improved with antiinflammatories - some DOE with activities that is chronic   2. LE edema  - no recent edema.      3. HTN - off HCTZ due to low Na during Jan 2021 admission for TIA. Norvasc 10mg  was also stopped at that time to allower permissive HTN, eventually restarted at lower dose of 5mg  daily    Home bp's 110s-130s/50s-80s    4. TIA - admission Jan 2021 -on ASA, statin   5. Hyperlipidemia - 09/2019 TC 139 TG 54 HDL 72 LDL 55 - 03/2020 TC 136 TG 49 HDL 74 LDL 51 - compliant with statin   12/2020 TC 149 TG 53 HDL 92 LDL 46  - has been taking atorvasting every other day due to muscle cramps   -did not tolerate ever every other day atorvastatin - tolerating pravastatin 40mg  daily.  - 10/2022 TC 204 TG 90 HDL 95 LDL 93     6. OSA - she is on cpap - last sleep study in 2021   7. Palpitations Jan 2022 monitor frequent PACS, rare PVCs - denies any recent issues, better of caffeine.  Past Medical History:  Diagnosis Date   Anemia    Anxiety    Arthritis    Complication of anesthesia    "pt. holds her breath when waking up"   Depression    GERD (gastroesophageal reflux disease)     Hypercholesteremia    Hypertension    Hypothyroidism    Osteoporosis    PONV (postoperative nausea and vomiting)    Staph infection    abdomen and returned 5 yrs. later   TIA (transient ischemic attack)      Allergies  Allergen Reactions   Carvedilol Other (See Comments)    Weaknesses/shortness of breath/dyspepsia/cramps   Sulfamethoxazole Swelling    Lips swell and feel numb     Current Outpatient Medications  Medication Sig Dispense Refill   ALPRAZolam (XANAX) 0.25 MG tablet Take 0.25 mg by mouth daily as needed for anxiety.     amLODipine (NORVASC) 5 MG tablet Take 1 tablet (5 mg total) by mouth daily. 90 tablet 3   aspirin EC 81 MG tablet Take 81 mg by mouth daily. Swallow whole.     benzonatate (TESSALON) 200 MG capsule Take 1 capsule by mouth three times daily as needed for cough 30 capsule 0   Biotin w/ Vitamins C & E (HAIR/SKIN/NAILS PO) Take 3 tablets by mouth daily. Gummy     carboxymethylcellulose (REFRESH PLUS) 0.5 % SOLN 1-2 drops 3 (three) times daily as needed (dry/irritated eyes).     chlorthalidone (HYGROTON) 25 MG tablet Take 25 mg by mouth as needed.  Cholecalciferol 125 MCG (5000 UT) TABS Take 1 tablet by mouth daily.     cloNIDine (CATAPRES) 0.1 MG tablet Take 0.1 mg by mouth daily as needed (diastolic blood pressure >90.).     diclofenac (VOLTAREN) 75 MG EC tablet Take 75 mg by mouth in the morning and at bedtime.     docusate sodium (COLACE) 100 MG capsule Take 300 mg by mouth in the morning.     donepezil (ARICEPT) 5 MG tablet Take 5 mg by mouth at bedtime.     DULoxetine (CYMBALTA) 60 MG capsule Take 60 mg by mouth every morning.     famotidine (PEPCID) 20 MG tablet One after supper 30 tablet 11   ferrous sulfate 325 (65 FE) MG tablet Take 1 tablet (325 mg total) by mouth daily. 30 tablet 3   GEMTESA 75 MG TABS Take 1 tablet by mouth daily.     hydrALAZINE (APRESOLINE) 50 MG tablet Take 1 tablet (50 mg total) by mouth 2 (two) times daily. 180  tablet 1   HYDROcodone-acetaminophen (NORCO/VICODIN) 5-325 MG tablet Take 1 tablet by mouth 2 (two) times daily as needed for moderate pain. 10 tablet 0   levothyroxine (SYNTHROID) 50 MCG tablet Take 50 mcg by mouth daily before breakfast.      losartan (COZAAR) 50 MG tablet Take 50 mg by mouth every evening.     Magnesium 250 MG TABS Take 250 mg by mouth daily.     nebivolol (BYSTOLIC) 10 MG tablet Take 1 tablet (10 mg total) by mouth at bedtime. 30 tablet 3   omeprazole (PRILOSEC) 20 MG capsule Take 20 mg by mouth every morning.     pravastatin (PRAVACHOL) 40 MG tablet TAKE ONE TABLET BY MOUTH EVERY EVENING 90 tablet 3   traMADol (ULTRAM) 50 MG tablet Take 50 mg by mouth 2 (two) times daily as needed (pain.).     No current facility-administered medications for this visit.     Past Surgical History:  Procedure Laterality Date   ABDOMINAL HYSTERECTOMY     BIOPSY  10/02/2021   Procedure: BIOPSY;  Surgeon: Lanelle Bal, DO;  [facility];  Service: Endoscopy;;   CATARACT EXTRACTION W/PHACO Right 01/22/2019   Procedure: CATARACT EXTRACTION PHACO AND INTRAOCULAR LENS PLACEMENT (IOC);  Surgeon: Fabio Pierce, MD;  Location: [facility];  Service: Ophthalmology;  Laterality: Right;  right, CDE: 19.63   CATARACT EXTRACTION W/PHACO Left 02/16/2019   Procedure: CATARACT EXTRACTION PHACO AND INTRAOCULAR LENS PLACEMENT LEFT EYE (CDE: 14.36);  Surgeon: Fabio Pierce, MD;  Location: [facility];  Service: Ophthalmology;  Laterality: Left;   CERVICAL FUSION  1990's   COLON SURGERY     COLONOSCOPY WITH PROPOFOL N/A 10/02/2021   Procedure: COLONOSCOPY WITH PROPOFOL;  Surgeon: Lanelle Bal, DO;  [facility]: [facility];  Service: Endoscopy;  Laterality: N/A;  11:45am   ESOPHAGOGASTRODUODENOSCOPY (EGD) WITH PROPOFOL N/A 10/02/2021   Procedure: ESOPHAGOGASTRODUODENOSCOPY (EGD) WITH PROPOFOL;  Surgeon: Lanelle Bal, DO;  [facility]: [facility];  Service: Endoscopy;  Laterality: N/A;   FOOT  SURGERY Left    reshaped   HERNIA REPAIR     HIP ARTHROPLASTY Left    JOINT REPLACEMENT Right    hip   LUMBAR SPINE SURGERY     REPLACEMENT TOTAL KNEE Right    THYROIDECTOMY, PARTIAL     TOTAL KNEE ARTHROPLASTY Left 08/21/2016   Procedure: LEFT TOTAL KNEE ARTHROPLASTY;  Surgeon: Sheral Apley, MD;  Location: [facility];  Service: Orthopedics;  Laterality: Left;     Allergies  Allergen Reactions   Carvedilol Other (See Comments)    Weaknesses/shortness of breath/dyspepsia/cramps   Sulfamethoxazole Swelling    Lips swell and feel numb      Family History  Problem Relation Age of Onset   Colon cancer Neg Hx    Colon polyps Neg Hx      Social History Ms. Gao reports that she has never smoked. She has never used smokeless tobacco. Ms. Belich reports no history of alcohol use.   Review of Systems CONSTITUTIONAL: No weight loss, fever, chills, weakness or fatigue.  HEENT: Eyes: No visual loss, blurred vision, double vision or yellow sclerae.No hearing loss, sneezing, congestion, runny nose or sore throat.  SKIN: No rash or itching.  CARDIOVASCULAR: per hpi RESPIRATORY: No shortness of breath, cough or sputum.  GASTROINTESTINAL: No anorexia, nausea, vomiting or diarrhea. No abdominal pain or blood.  GENITOURINARY: No burning on urination, no polyuria NEUROLOGICAL: No headache, dizziness, syncope, paralysis, ataxia, numbness or tingling in the extremities. No change in bowel or bladder control.  MUSCULOSKELETAL: No muscle, back pain, joint pain or stiffness.  LYMPHATICS: No enlarged nodes. No history of splenectomy.  PSYCHIATRIC: No history of depression or anxiety.  ENDOCRINOLOGIC: No reports of sweating, cold or heat intolerance. No polyuria or polydipsia.  Marland Kitchen   Physical Examination Today's Vitals   01/08/23 1311  BP: 138/78  Pulse: 69  SpO2: 96%  Weight: 229 lb 12.8 oz (104.2 kg)  Height: 4\' 10"  (1.473 m)  PainSc: 0-No pain   Body mass index is 48.03  kg/m.  Gen: resting comfortably, no acute distress HEENT: no scleral icterus, pupils equal round and reactive, no palptable cervical adenopathy,  CV: RRR, no m/rg, no jvd Resp: Clear to auscultation bilaterally GI: abdomen is soft, non-tender, non-distended, normal bowel sounds, no hepatosplenomegaly MSK: extremities are warm, no edema.  Skin: warm, no rash Neuro:  no focal deficits Psych: appropriate affect   Diagnostic Studies 03/2017 echo Study Conclusions   - Left ventricle: The cavity size was normal. Systolic function was   normal. The estimated ejection fraction was in the range of 55%   to 60%. Wall motion was normal; there were no regional wall   motion abnormalities. Doppler parameters are consistent with   abnormal left ventricular relaxation (grade 1 diastolic   dysfunction). - Mitral valve: There was moderate regurgitation. - Atrial septum: No defect or patent foramen ovale was identified. - Tricuspid valve: There was mild regurgitation. - Pulmonic valve: There was mild regurgitation. - Pulmonary arteries: PA peak pressure: 41 mm Hg (S). - Systemic veins: The IVC was suboptimally visualized, but appeared   dilated. There was normal respiratory variation. Estimated RAP 8   mmHg.     Jan 2022 7 day zio patch 7 day monitor Min HR 55, Max HR 119, Avg HR 69 Frequent supraventricular ectopy in the form of isolated PACs, couplets, triplets Rare ventricular ectopy in the form of isolated PVCs. Reported symptoms correlated with sinus rhythm with PACs        Assessment and Plan  1.SOB/hoarseness - followed by ENT, improved with antacids. Being managed for reflux as the etiology     2. Hyperlipidemia - did not tolerate atorvastat - tolerating pravastatin. LDL not at goal, increase pravastatin to 80mg  daily.      3.Palpitations - no recent symptoms, continue current meds   4. HTN - at goal based on home numbers, continue current meds.  Antoine Poche, M.D.

## 2023-01-14 DIAGNOSIS — L308 Other specified dermatitis: Secondary | ICD-10-CM | POA: Diagnosis not present

## 2023-01-23 DIAGNOSIS — M4015 Other secondary kyphosis, thoracolumbar region: Secondary | ICD-10-CM | POA: Diagnosis not present

## 2023-01-26 ENCOUNTER — Other Ambulatory Visit: Payer: Self-pay | Admitting: Internal Medicine

## 2023-02-15 ENCOUNTER — Other Ambulatory Visit: Payer: Self-pay | Admitting: *Deleted

## 2023-02-15 MED ORDER — AMLODIPINE BESYLATE 5 MG PO TABS: 5.0000 mg | ORAL_TABLET | Freq: Every day | ORAL | 3 refills | Status: AC

## 2023-02-15 MED ORDER — PRAVASTATIN SODIUM 80 MG PO TABS: 80.0000 mg | ORAL_TABLET | Freq: Every day | ORAL | 1 refills | Status: AC

## 2023-02-25 ENCOUNTER — Other Ambulatory Visit: Payer: Self-pay

## 2023-02-25 ENCOUNTER — Ambulatory Visit
Admission: EM | Admit: 2023-02-25 | Discharge: 2023-02-25 | Disposition: A | Discharge status: Home / Self Care | Payer: Medicare HMO | Attending: Nurse Practitioner | Admitting: Nurse Practitioner

## 2023-02-25 DIAGNOSIS — N390 Urinary tract infection, site not specified: Secondary | ICD-10-CM | POA: Insufficient documentation

## 2023-02-25 LAB — POCT URINALYSIS DIP (MANUAL ENTRY)
Bilirubin, UA: NEGATIVE
Blood, UA: NEGATIVE
Glucose, UA: NEGATIVE mg/dL
Nitrite, UA: POSITIVE — AB
Protein Ur, POC: 30 mg/dL — AB
Spec Grav, UA: 1.02 (ref 1.010–1.025)
Urobilinogen, UA: 0.2 E.U./dL
pH, UA: 6.5 (ref 5.0–8.0)

## 2023-02-25 MED ORDER — CEPHALEXIN 500 MG PO CAPS: 500.0000 mg | ORAL_CAPSULE | Freq: Two times a day (BID) | ORAL | 0 refills | Status: AC

## 2023-02-25 NOTE — ED Provider Notes (Signed)
[facility]    CSN: [id number] Arrival date & time: 02/25/23  1707      History   Chief Complaint No chief complaint on file.   HPI [name] is a 77 y.o. female.   The history is provided by the patient.   Patient presents for complaints of burning with urination, abdominal pressure, pain with bowel movements, low back pain, and fatigue that has been present for the past month.  She denies fever, chills, hematuria, decreased urine stream, flank pain, urinary urgency, or hesitancy.  Patient was treated for UTI over the past several months.  She states that her symptoms did seem to improve, but began worsening over the last several days.  Patient has not taken any medication for her symptoms.  Past Medical History:  Diagnosis Date   Anemia    Anxiety    Arthritis    Complication of anesthesia    "pt. holds her breath when waking up"   Depression    GERD (gastroesophageal reflux disease)    Hypercholesteremia    Hypertension    Hypothyroidism    Osteoporosis    PONV (postoperative nausea and vomiting)    Staph infection    abdomen and returned 5 yrs. later   TIA (transient ischemic attack)     Patient Active Problem List   Diagnosis Date Noted   Upper airway cough syndrome 02/12/2022   DOE (dyspnea on exertion) 02/12/2022   Morbid obesity due to excess calories (HCC) 02/12/2022   Positive colorectal cancer screening using Cologuard test 09/15/2021   Anemia 09/15/2021   COVID-19 virus infection 06/23/2019   Transient speech disturbance 06/22/2019   Hypothyroidism    Migraine equivalent 04/16/2017   Anxiety 04/16/2017   Palpitation 04/16/2017   Hyperlipidemia 04/16/2017   Essential hypertension 03/06/2017   Hypokalemia 03/06/2017   Hyponatremia 03/06/2017   TIA (transient ischemic attack) 03/05/2017   Primary localized osteoarthritis of left knee 08/22/2016   Paresthesia 08/10/2016   Weakness 08/10/2016   Depression 07/31/2016   S/P  thyroidectomy 07/31/2016   Primary osteoarthritis of left knee 07/31/2016   S/P cervical spinal fusion 07/31/2016   S/P total knee arthroplasty, right 07/31/2016   SUBLUXATION, RADIAL HEAD 10/27/2009   CONTUSION, ELBOW 10/27/2009    Past Surgical History:  Procedure Laterality Date   ABDOMINAL HYSTERECTOMY     BIOPSY  10/02/2021   Procedure: BIOPSY;  Surgeon: Lanelle Bal, DO;  Location: [facility];  Service: Endoscopy;;   CATARACT EXTRACTION W/PHACO Right 01/22/2019   Procedure: CATARACT EXTRACTION PHACO AND INTRAOCULAR LENS PLACEMENT (IOC);  Surgeon: Fabio Pierce, MD;  Location: [facility];  Service: Ophthalmology;  Laterality: Right;  right, CDE: 19.63   CATARACT EXTRACTION W/PHACO Left 02/16/2019   Procedure: CATARACT EXTRACTION PHACO AND INTRAOCULAR LENS PLACEMENT LEFT EYE (CDE: 14.36);  Surgeon: Fabio Pierce, MD;  Location: [facility];  Service: Ophthalmology;  Laterality: Left;   CERVICAL FUSION  1990's   COLON SURGERY     COLONOSCOPY WITH PROPOFOL N/A 10/02/2021   Procedure: COLONOSCOPY WITH PROPOFOL;  Surgeon: Lanelle Bal, DO;  Location: [facility];  Service: Endoscopy;  Laterality: N/A;  11:45am   ESOPHAGOGASTRODUODENOSCOPY (EGD) WITH PROPOFOL N/A 10/02/2021   Procedure: ESOPHAGOGASTRODUODENOSCOPY (EGD) WITH PROPOFOL;  Surgeon: Lanelle Bal, DO;  Location: [facility];  Service: Endoscopy;  Laterality: N/A;   FOOT SURGERY Left    reshaped   HERNIA REPAIR     HIP ARTHROPLASTY Left    JOINT REPLACEMENT Right  hip   LUMBAR SPINE SURGERY     REPLACEMENT TOTAL KNEE Right    THYROIDECTOMY, PARTIAL     TOTAL KNEE ARTHROPLASTY Left 08/21/2016   Procedure: LEFT TOTAL KNEE ARTHROPLASTY;  Surgeon: Sheral Apley, MD;  Location: [facility];  Service: Orthopedics;  Laterality: Left;    OB History   No obstetric history on file.      Home Medications    Prior to Admission medications   Medication Sig Start Date End Date Taking? Authorizing Provider  ALPRAZolam  Prudy Feeler) 0.25 MG tablet Take 0.25 mg by mouth daily as needed for anxiety.    [provider]  amLODipine (NORVASC) 5 MG tablet Take 1 tablet (5 mg total) by mouth daily. 02/15/23 05/16/23  Antoine Poche, MD  aspirin EC 81 MG tablet Take 81 mg by mouth daily. Swallow whole.    [provider]  Biotin w/ Vitamins C & E (HAIR/SKIN/NAILS PO) Take 3 tablets by mouth daily. Gummy    [provider]  carboxymethylcellulose (REFRESH PLUS) 0.5 % SOLN 1-2 drops 3 (three) times daily as needed (dry/irritated eyes).    [provider]  diclofenac (VOLTAREN) 75 MG EC tablet Take 75 mg by mouth in the morning and at bedtime. 02/10/21   [provider]  docusate sodium (COLACE) 100 MG capsule Take 300 mg by mouth in the morning.    [provider]  donepezil (ARICEPT) 5 MG tablet Take 5 mg by mouth at bedtime. 01/29/21   [provider]  DULoxetine (CYMBALTA) 60 MG capsule Take 60 mg by mouth every morning. 06/17/19   [provider]  famotidine (PEPCID) 20 MG tablet One after supper 02/12/22   Nyoka Cowden, MD  ferrous sulfate 325 (65 FE) MG tablet Take 1 tablet (325 mg total) by mouth daily. 09/17/21 09/17/22  Letta Median, PA-C  GEMTESA 75 MG TABS Take 1 tablet by mouth daily. 11/27/21   [provider]  hydrALAZINE (APRESOLINE) 50 MG tablet Take 1 tablet (50 mg total) by mouth 2 (two) times daily. 11/10/20   Antoine Poche, MD  HYDROcodone-acetaminophen (NORCO/VICODIN) 5-325 MG tablet Take 1 tablet by mouth 2 (two) times daily as needed for moderate pain. 05/09/22   Particia Nearing, PA-C  levothyroxine (SYNTHROID) 50 MCG tablet Take 50 mcg by mouth daily before breakfast.  06/01/16   [provider]  losartan (COZAAR) 50 MG tablet Take 50 mg by mouth every evening. 02/24/21   [provider]  Magnesium 250 MG TABS Take 250 mg by mouth daily.    [provider]  nebivolol (BYSTOLIC) 10 MG  tablet Take 1 tablet (10 mg total) by mouth at bedtime. 06/23/19   Shon Hale, MD  omeprazole (PRILOSEC) 20 MG capsule Take 20 mg by mouth every morning. 03/22/22   [provider]  pravastatin (PRAVACHOL) 80 MG tablet Take 1 tablet (80 mg total) by mouth daily. 02/15/23   Antoine Poche, MD  traMADol (ULTRAM) 50 MG tablet Take 50 mg by mouth 2 (two) times daily as needed (pain.). 01/30/21   [provider]    Family History Family History  Problem Relation Age of Onset   Colon cancer Neg Hx    Colon polyps Neg Hx     Social History Social History   Tobacco Use   Smoking status: Never   Smokeless tobacco: Never  Vaping Use   Vaping status: Never Used  Substance Use Topics   Alcohol use:  No   Drug use: No     Allergies   Carvedilol and Sulfamethoxazole   Review of Systems Review of Systems Per HPI  Physical Exam Triage Vital Signs ED Triage Vitals  Encounter Vitals Group     BP --      Systolic BP Percentile --      Diastolic BP Percentile --      Pulse Rate 02/25/23 1836 79     Resp 02/25/23 1836 16     Temp 02/25/23 1836 97.7 F (36.5 C)     Temp src --      SpO2 02/25/23 1836 94 %     Weight --      Height --      Head Circumference --      Peak Flow --      Pain Score 02/25/23 1839 4     Pain Loc --      Pain Education --      Exclude from Growth Chart --    No data found.  Updated Vital Signs Pulse 79   Temp 97.7 F (36.5 C)   Resp 16   SpO2 94%   Visual Acuity Right Eye Distance:   Left Eye Distance:   Bilateral Distance:    Right Eye Near:   Left Eye Near:    Bilateral Near:     Physical Exam Vitals and nursing note reviewed.  Constitutional:      General: She is not in acute distress.    Appearance: Normal appearance.  HENT:     Head: Normocephalic.  Eyes:     Extraocular Movements: Extraocular movements intact.     Pupils: Pupils are equal, round, and reactive to light.  Cardiovascular:     Rate and  Rhythm: Normal rate and regular rhythm.     Pulses: Normal pulses.     Heart sounds: Normal heart sounds.  Pulmonary:     Effort: Pulmonary effort is normal. No respiratory distress.     Breath sounds: Normal breath sounds. No stridor. No wheezing, rhonchi or rales.  Abdominal:     General: Bowel sounds are normal.     Palpations: Abdomen is soft.     Tenderness: There is no abdominal tenderness.  Musculoskeletal:     Cervical back: Normal range of motion.  Lymphadenopathy:     Cervical: No cervical adenopathy.  Neurological:     General: No focal deficit present.     Mental Status: She is alert and oriented to person, place, and time.  Psychiatric:        Mood and Affect: Mood normal.        Behavior: Behavior normal.      UC Treatments / Results  Labs (all labs ordered are listed, but only abnormal results are displayed) Labs Reviewed  POCT URINALYSIS DIP (MANUAL ENTRY) - Abnormal; Notable for the following components:      Result Value   Ketones, POC UA trace (5) (*)    Protein Ur, POC =30 (*)    Nitrite, UA Positive (*)    Leukocytes, UA Large (3+) (*)    All other components within normal limits    EKG   Radiology No results found.  Procedures Procedures (including critical care time)  Medications Ordered in UC Medications - No data to display  Initial Impression / Assessment and Plan / UC Course  I have reviewed the triage vital signs and the nursing notes.  Pertinent labs & imaging results that were  available during my care of the patient were reviewed by me and considered in my medical decision making (see chart for details).  Urinalysis is positive for a urinary tract infection in the presence of positive nitrates and large leukocytes.  Urine culture is pending.  Will treat UTI empirically with Keflex 500 mg twice daily for the next 7 days.  Supportive care recommendations were provided and discussed with the patient to include over-the-counter Tylenol  for pain or discomfort, drinking at least 5-7 8 ounce glasses of water daily, voiding every 2 hours, and avoiding caffeine such as tea, soda and coffee.  Patient was given strict ER follow-up precautions.  Patient is in agreement with this plan of care and verbalizes understanding.  All questions were answered.  Patient stable for discharge.  Final Clinical Impressions(s) / UC Diagnoses   Final diagnoses:  None   Discharge Instructions   None    ED Prescriptions   None    PDMP not reviewed this encounter.   Abran Cantor, NP 02/25/23 1920

## 2023-02-25 NOTE — ED Triage Notes (Signed)
Pt c/o possible Kidney Infection, started with burning with urination, then started to hurt at the end of the stream, urine is bright yellow and cloudy, abdominal pressure and pain with bowel movement, lower back pain, fatigue x 1 month, pt states some [facility] pharmacy mixed up her doses of her medication "

## 2023-02-25 NOTE — Discharge Instructions (Addendum)
The urinalysis shows that you do have a urinary tract infection.  A urine culture is pending to ensure you have been treated with the appropriate antibiotic.  If the medication needs to be changed, you will be contacted. Take medication as prescribed. Increase fluids and allow for plenty of rest.  Try to drink at least 5-7 8 ounce glasses of water daily. Try to urinate every 2 hours while symptoms persist. Avoid caffeine such as tea, soda, and coffee while symptoms persist. If you experience worsening urinary symptoms with new symptoms of fever, chills, or other concerns, please follow-up in the emergency department immediately for further evaluation. If you continue to have recurrent urinary tract infections, it is recommended that you follow-up with urology for further evaluation. Follow-up as needed.

## 2023-02-27 LAB — URINE CULTURE: Culture: >=100000 — AB

## 2023-03-03 IMAGING — DX DG CHEST 2V
2 series · 2 of 2 positions shown · non-contrast
Comparison: 03/03/2021

CLINICAL DATA: Shortness of breath

EXAM:
CHEST - 2 VIEW

[chest pa]
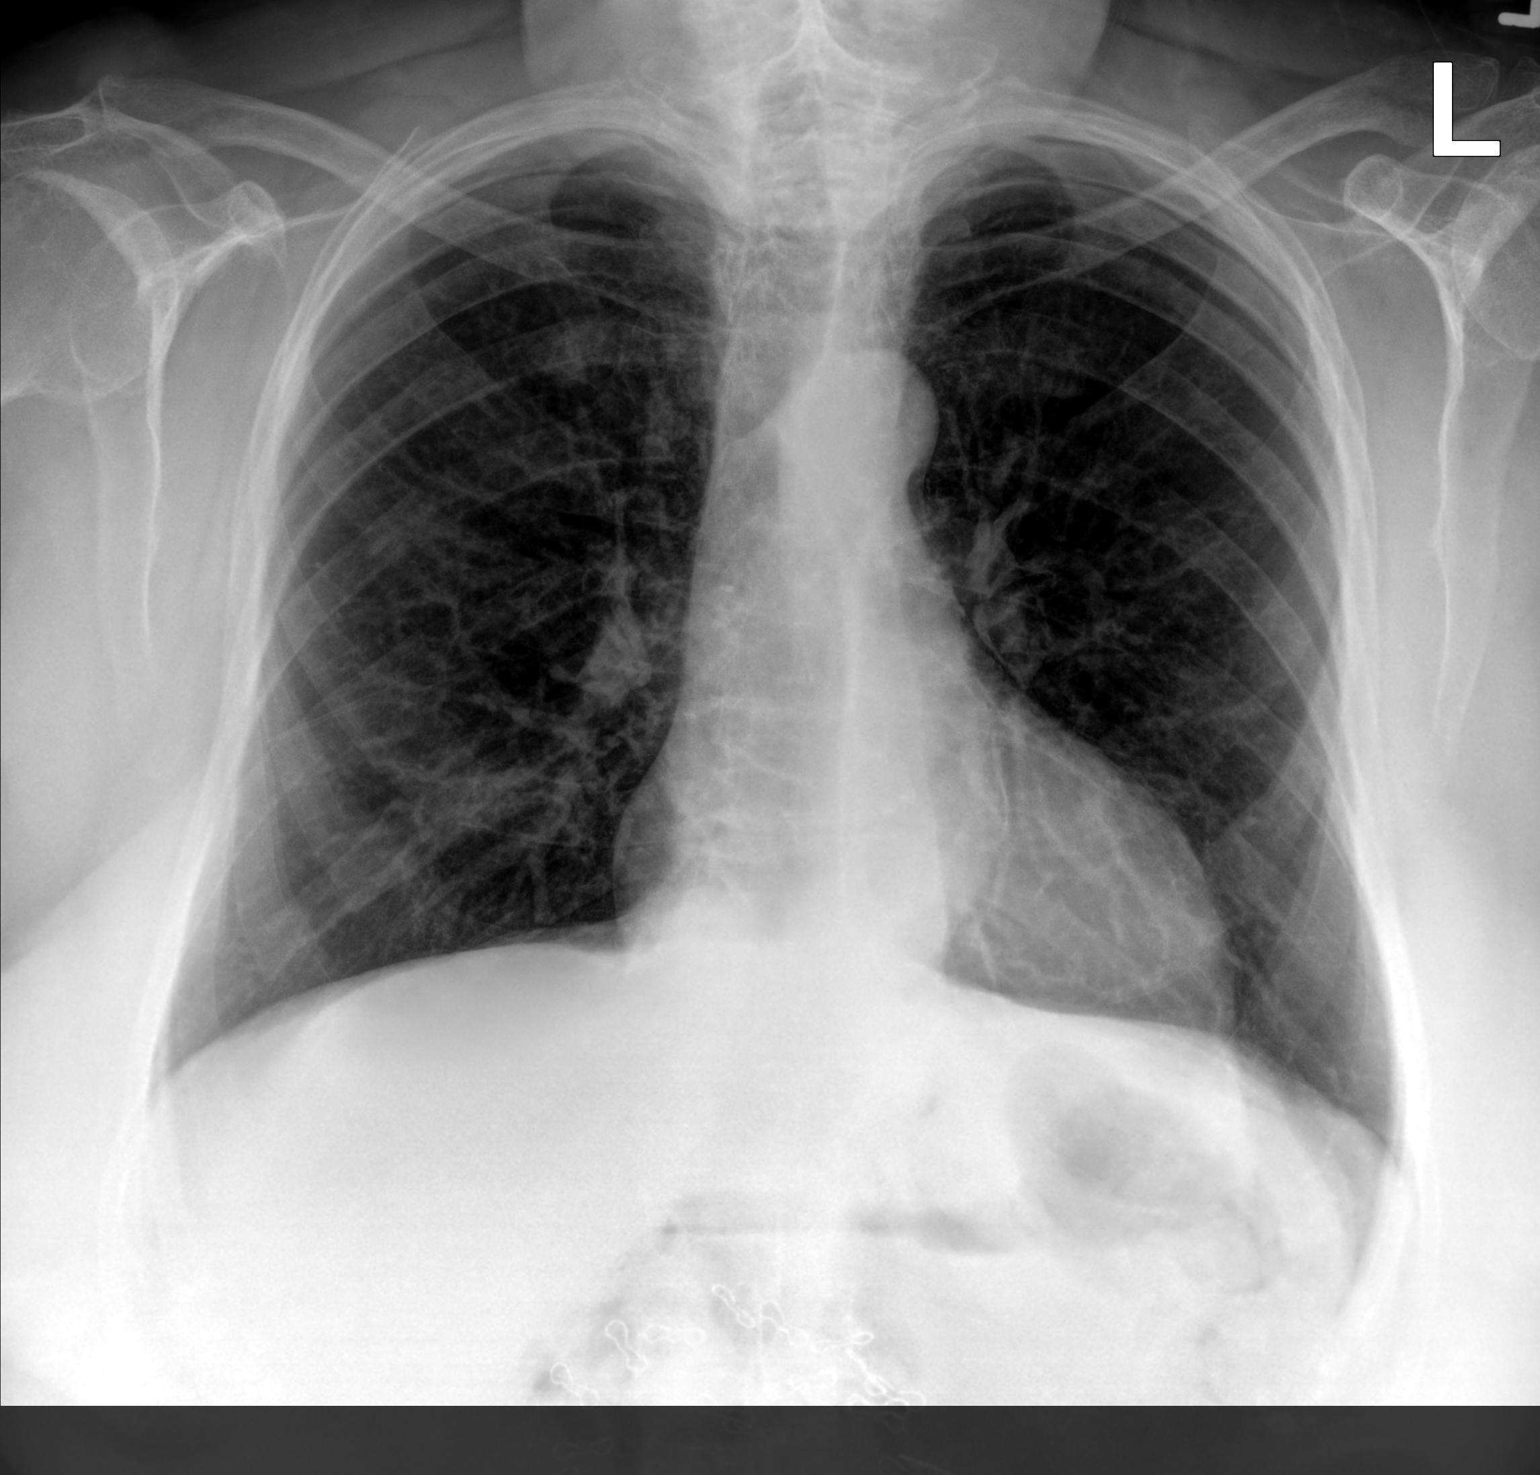

[chest lat]
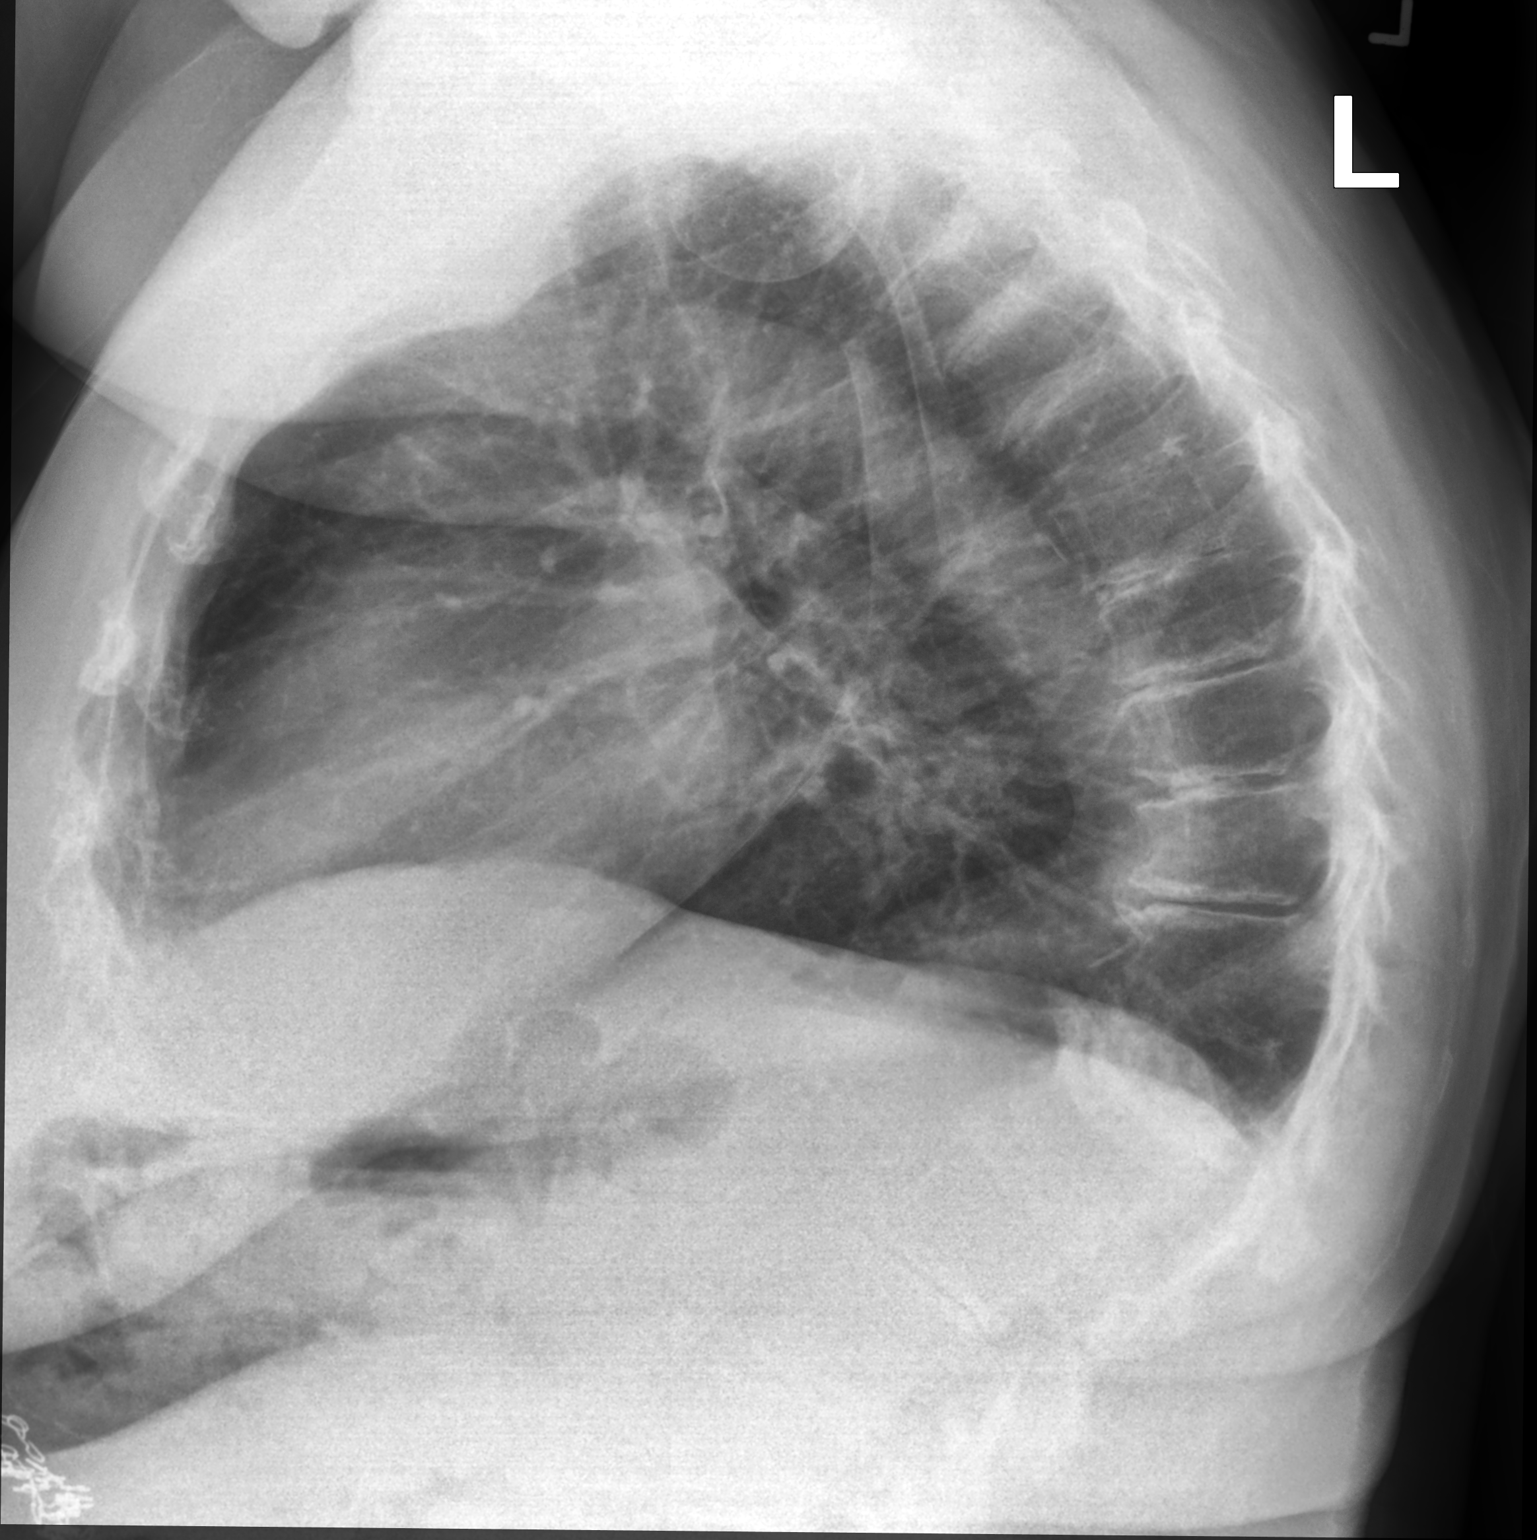

[2 of 2 positions shown; findings below may reference images not displayed]

FINDINGS: The heart size and mediastinal contours are within normal limits.
Both lungs are clear. Disc degenerative disease of the thoracic
spine with exaggerated kyphosis.
IMPRESSION: No acute abnormality of the lungs.

## 2023-04-05 ENCOUNTER — Encounter: Payer: Self-pay | Admitting: Emergency Medicine

## 2023-04-05 ENCOUNTER — Ambulatory Visit
Admission: EM | Admit: 2023-04-05 | Discharge: 2023-04-05 | Disposition: A | Discharge status: Home / Self Care | Payer: Medicare PPO | Attending: Family Medicine | Admitting: Family Medicine

## 2023-04-05 DIAGNOSIS — R062 Wheezing: Secondary | ICD-10-CM

## 2023-04-05 DIAGNOSIS — J22 Unspecified acute lower respiratory infection: Secondary | ICD-10-CM

## 2023-04-05 MED ORDER — AMOXICILLIN-POT CLAVULANATE 875-125 MG PO TABS: 1.0000 | ORAL_TABLET | Freq: Two times a day (BID) | ORAL | 0 refills | Status: DC

## 2023-04-05 MED ORDER — DEXAMETHASONE SODIUM PHOSPHATE 10 MG/ML IJ SOLN
10.0000 mg | Freq: Once | INTRAMUSCULAR | Status: AC
  Administered 2023-04-05: 10 mg via INTRAMUSCULAR

## 2023-04-05 MED ORDER — BENZONATATE 100 MG PO CAPS: 100.0000 mg | ORAL_CAPSULE | Freq: Three times a day (TID) | ORAL | 0 refills | Status: DC

## 2023-04-05 NOTE — ED Triage Notes (Signed)
Head congestion, productive cough x 1 week.  Has been taking nyquil and allergy medication to help symptoms.

## 2023-04-05 NOTE — ED Provider Notes (Signed)
[facility]    CSN: [id number] Arrival date & time: 04/05/23  1352      History   Chief Complaint No chief complaint on file.   HPI [name] is a 77 y.o. female.   Patient presenting today with 1 week history of progressively worsening productive cough, sinus congestion, wheezing, chest tightness.  States initially it felt like a bad cold but symptoms have been progressively worsening particularly with the cough and chest tightness.  Denies chest pain, abdominal pain, nausea vomiting or diarrhea.  Taking NyQuil and her allergy regimen with minimal relief.   Past Medical History:  Diagnosis Date   Anemia    Anxiety    Arthritis    Complication of anesthesia    "pt. holds her breath when waking up"   Depression    GERD (gastroesophageal reflux disease)    Hypercholesteremia    Hypertension    Hypothyroidism    Osteoporosis    PONV (postoperative nausea and vomiting)    Staph infection    abdomen and returned 5 yrs. later   TIA (transient ischemic attack)    Patient Active Problem List   Diagnosis Date Noted   Upper airway cough syndrome 02/12/2022   DOE (dyspnea on exertion) 02/12/2022   Morbid obesity due to excess calories (HCC) 02/12/2022   Positive colorectal cancer screening using Cologuard test 09/15/2021   Anemia 09/15/2021   COVID-19 virus infection 06/23/2019   Transient speech disturbance 06/22/2019   Hypothyroidism    Migraine equivalent 04/16/2017   Anxiety 04/16/2017   Palpitation 04/16/2017   Hyperlipidemia 04/16/2017   Essential hypertension 03/06/2017   Hypokalemia 03/06/2017   Hyponatremia 03/06/2017   TIA (transient ischemic attack) 03/05/2017   Primary localized osteoarthritis of left knee 08/22/2016   Paresthesia 08/10/2016   Weakness 08/10/2016   Depression 07/31/2016   S/P thyroidectomy 07/31/2016   Primary osteoarthritis of left knee 07/31/2016   S/P cervical spinal fusion 07/31/2016   S/P total knee arthroplasty,  right 07/31/2016   SUBLUXATION, RADIAL HEAD 10/27/2009   CONTUSION, ELBOW 10/27/2009    Past Surgical History:  Procedure Laterality Date   ABDOMINAL HYSTERECTOMY     BIOPSY  10/02/2021   Procedure: BIOPSY;  Surgeon: Lanelle Bal, DO;  Location: [facility];  Service: Endoscopy;;   CATARACT EXTRACTION W/PHACO Right 01/22/2019   Procedure: CATARACT EXTRACTION PHACO AND INTRAOCULAR LENS PLACEMENT (IOC);  Surgeon: Fabio Pierce, MD;  Location: [facility];  Service: Ophthalmology;  Laterality: Right;  right, CDE: 19.63   CATARACT EXTRACTION W/PHACO Left 02/16/2019   Procedure: CATARACT EXTRACTION PHACO AND INTRAOCULAR LENS PLACEMENT LEFT EYE (CDE: 14.36);  Surgeon: Fabio Pierce, MD;  Location: [facility];  Service: Ophthalmology;  Laterality: Left;   CERVICAL FUSION  1990's   COLON SURGERY     COLONOSCOPY WITH PROPOFOL N/A 10/02/2021   Procedure: COLONOSCOPY WITH PROPOFOL;  Surgeon: Lanelle Bal, DO;  Location: [facility];  Service: Endoscopy;  Laterality: N/A;  11:45am   ESOPHAGOGASTRODUODENOSCOPY (EGD) WITH PROPOFOL N/A 10/02/2021   Procedure: ESOPHAGOGASTRODUODENOSCOPY (EGD) WITH PROPOFOL;  Surgeon: Lanelle Bal, DO;  Location: [facility];  Service: Endoscopy;  Laterality: N/A;   FOOT SURGERY Left    reshaped   HERNIA REPAIR     HIP ARTHROPLASTY Left    JOINT REPLACEMENT Right    hip   LUMBAR SPINE SURGERY     REPLACEMENT TOTAL KNEE Right    THYROIDECTOMY, PARTIAL     TOTAL KNEE ARTHROPLASTY Left 08/21/2016  Procedure: LEFT TOTAL KNEE ARTHROPLASTY;  Surgeon: Sheral Apley, MD;  Location: [facility];  Service: Orthopedics;  Laterality: Left;    OB History   No obstetric history on file.      Home Medications    Prior to Admission medications   Medication Sig Start Date End Date Taking? Authorizing Provider  amoxicillin-clavulanate (AUGMENTIN) 875-125 MG tablet Take 1 tablet by mouth every 12 (twelve) hours. 04/05/23  Yes Particia Nearing, PA-C  benzonatate  (TESSALON) 100 MG capsule Take 1 capsule (100 mg total) by mouth every 8 (eight) hours. 04/05/23  Yes Particia Nearing, PA-C  ALPRAZolam Prudy Feeler) 0.25 MG tablet Take 0.25 mg by mouth daily as needed for anxiety.    [provider]  amLODipine (NORVASC) 5 MG tablet Take 1 tablet (5 mg total) by mouth daily. 02/15/23 05/16/23  Antoine Poche, MD  aspirin EC 81 MG tablet Take 81 mg by mouth daily. Swallow whole.    [provider]  Biotin w/ Vitamins C & E (HAIR/SKIN/NAILS PO) Take 3 tablets by mouth daily. Gummy    [provider]  carboxymethylcellulose (REFRESH PLUS) 0.5 % SOLN 1-2 drops 3 (three) times daily as needed (dry/irritated eyes).    [provider]  diclofenac (VOLTAREN) 75 MG EC tablet Take 75 mg by mouth in the morning and at bedtime. 02/10/21   [provider]  docusate sodium (COLACE) 100 MG capsule Take 300 mg by mouth in the morning.    [provider]  donepezil (ARICEPT) 5 MG tablet Take 5 mg by mouth at bedtime. 01/29/21   [provider]  DULoxetine (CYMBALTA) 60 MG capsule Take 60 mg by mouth every morning. 06/17/19   [provider]  famotidine (PEPCID) 20 MG tablet One after supper 02/12/22   Nyoka Cowden, MD  ferrous sulfate 325 (65 FE) MG tablet Take 1 tablet (325 mg total) by mouth daily. 09/17/21 09/17/22  Letta Median, PA-C  GEMTESA 75 MG TABS Take 1 tablet by mouth daily. 11/27/21   [provider]  hydrALAZINE (APRESOLINE) 50 MG tablet Take 1 tablet (50 mg total) by mouth 2 (two) times daily. 11/10/20   Antoine Poche, MD  HYDROcodone-acetaminophen (NORCO/VICODIN) 5-325 MG tablet Take 1 tablet by mouth 2 (two) times daily as needed for moderate pain. 05/09/22   Particia Nearing, PA-C  levothyroxine (SYNTHROID) 50 MCG tablet Take 50 mcg by mouth daily before breakfast.  06/01/16   [provider]  losartan (COZAAR) 50 MG tablet Take 50 mg by mouth every evening.  02/24/21   [provider]  Magnesium 250 MG TABS Take 250 mg by mouth daily.    [provider]  nebivolol (BYSTOLIC) 10 MG tablet Take 1 tablet (10 mg total) by mouth at bedtime. 06/23/19   Shon Hale, MD  omeprazole (PRILOSEC) 20 MG capsule Take 20 mg by mouth every morning. 03/22/22   [provider]  pravastatin (PRAVACHOL) 80 MG tablet Take 1 tablet (80 mg total) by mouth daily. 02/15/23   Antoine Poche, MD  traMADol (ULTRAM) 50 MG tablet Take 50 mg by mouth 2 (two) times daily as needed (pain.). 01/30/21   [provider]    Family History Family History  Problem Relation Age of Onset   Colon cancer Neg Hx    Colon polyps Neg Hx     Social History Social History   Tobacco Use   Smoking status: Never   Smokeless tobacco: Never  Vaping Use   Vaping status: Never Used  Substance Use Topics   Alcohol use: No   Drug use: No     Allergies   Carvedilol and Sulfamethoxazole   Review of Systems Review of Systems Per HPI  Physical Exam Triage Vital Signs ED Triage Vitals  Encounter Vitals Group     BP 04/05/23 1424 (!) 148/70     Systolic BP Percentile --      Diastolic BP Percentile --      Pulse Rate 04/05/23 1424 77     Resp 04/05/23 1424 (!) 22     Temp 04/05/23 1424 98.3 F (36.8 C)     Temp Source 04/05/23 1424 Oral     SpO2 04/05/23 1424 95 %     Weight --      Height --      Head Circumference --      Peak Flow --      Pain Score 04/05/23 1427 0     Pain Loc --      Pain Education --      Exclude from Growth Chart --    No data found.  Updated Vital Signs BP (!) 148/70 (BP Location: Right Arm)   Pulse 77   Temp 98.3 F (36.8 C) (Oral)   Resp (!) 22   SpO2 95%   Visual Acuity Right Eye Distance:   Left Eye Distance:   Bilateral Distance:    Right Eye Near:   Left Eye Near:    Bilateral Near:     Physical Exam Vitals and nursing note reviewed.  Constitutional:      Appearance: Normal  appearance. She is not ill-appearing.  HENT:     Head: Atraumatic.     Right Ear: Tympanic membrane normal.     Left Ear: Tympanic membrane normal.     Nose: Congestion present.     Mouth/Throat:     Mouth: Mucous membranes are moist.     Pharynx: Oropharynx is clear. Posterior oropharyngeal erythema present.  Eyes:     Extraocular Movements: Extraocular movements intact.     Conjunctiva/sclera: Conjunctivae normal.  Cardiovascular:     Rate and Rhythm: Normal rate and regular rhythm.     Heart sounds: Normal heart sounds.  Pulmonary:     Effort: Pulmonary effort is normal.     Breath sounds: Wheezing present.  Musculoskeletal:        General: Normal range of motion.     Cervical back: Normal range of motion and neck supple.  Skin:    General: Skin is warm and dry.  Neurological:     Mental Status: She is alert and oriented to person, place, and time.     Motor: No weakness.     Gait: Gait normal.  Psychiatric:        Mood and Affect: Mood normal.        Thought Content: Thought content normal.        Judgment: Judgment normal.    UC Treatments / Results  Labs (all labs ordered are listed, but only abnormal results are displayed) Labs Reviewed - No data to display  EKG   Radiology No results found.  Procedures Procedures (including critical care time)  Medications Ordered in UC Medications  dexamethasone (DECADRON) injection 10 mg (10 mg Intramuscular Given 04/05/23 1536)   Initial Impression / Assessment and Plan / UC Course  I have reviewed the triage vital signs and the nursing notes.  Pertinent labs &  imaging results that were available during my care of the patient were reviewed by me and considered in my medical decision making (see chart for details).     Given duration worsening course, treat with Augmentin, Tessalon, IM Decadron in clinic.  Discussed supportive over-the-counter medications, home care.  Return for worsening symptoms.  Final Clinical  Impressions(s) / UC Diagnoses   Final diagnoses:  Lower respiratory infection  Wheezing   Discharge Instructions   None    ED Prescriptions     Medication Sig Dispense Auth. Provider   amoxicillin-clavulanate (AUGMENTIN) 875-125 MG tablet Take 1 tablet by mouth every 12 (twelve) hours. 14 tablet Particia Nearing, [place]   benzonatate (TESSALON) 100 MG capsule Take 1 capsule (100 mg total) by mouth every 8 (eight) hours. 21 capsule Particia Nearing, [place]      PDMP not reviewed this encounter.   Particia Nearing, [place] 04/05/23 1552

## 2023-04-29 ENCOUNTER — Ambulatory Visit
Admission: EM | Admit: 2023-04-29 | Discharge: 2023-04-29 | Disposition: A | Discharge status: Home / Self Care | Payer: Medicare PPO | Attending: Family Medicine | Admitting: Family Medicine

## 2023-04-29 DIAGNOSIS — R21 Rash and other nonspecific skin eruption: Secondary | ICD-10-CM

## 2023-04-29 MED ORDER — TRIAMCINOLONE ACETONIDE 0.1 % EX CREA: 1.0000 | TOPICAL_CREAM | Freq: Two times a day (BID) | CUTANEOUS | 0 refills | Status: AC

## 2023-04-29 MED ORDER — PREDNISONE 10 MG PO TABS: ORAL_TABLET | ORAL | 0 refills | Status: DC

## 2023-04-29 NOTE — ED Provider Notes (Signed)
[facility]    CSN: [id number] Arrival date & time: 04/29/23  1204      History   Chief Complaint Chief Complaint  Patient presents with   Rash    HPI [name] is a 77 y.o. female.   Patient presenting today with 2-week history of itchy rash across entire body.  States that started out on just her arms and is now everywhere besides her face.  Has tried changing her detergents to all Free and clear, changing her soap to [place] and only using unscented lotions with no relief.  Trying cortisone cream and Benadryl with no relief additionally.  Denies throat itching or swelling, chest tightness, nausea, vomiting.    Past Medical History:  Diagnosis Date   Anemia    Anxiety    Arthritis    Complication of anesthesia    "pt. holds her breath when waking up"   Depression    GERD (gastroesophageal reflux disease)    Hypercholesteremia    Hypertension    Hypothyroidism    Osteoporosis    PONV (postoperative nausea and vomiting)    Staph infection    abdomen and returned 5 yrs. later   TIA (transient ischemic attack)     Patient Active Problem List   Diagnosis Date Noted   Upper airway cough syndrome 02/12/2022   DOE (dyspnea on exertion) 02/12/2022   Morbid obesity due to excess calories (HCC) 02/12/2022   Positive colorectal cancer screening using Cologuard test 09/15/2021   Anemia 09/15/2021   COVID-19 virus infection 06/23/2019   Transient speech disturbance 06/22/2019   Hypothyroidism    Migraine equivalent 04/16/2017   Anxiety 04/16/2017   Palpitation 04/16/2017   Hyperlipidemia 04/16/2017   Essential hypertension 03/06/2017   Hypokalemia 03/06/2017   Hyponatremia 03/06/2017   TIA (transient ischemic attack) 03/05/2017   Primary localized osteoarthritis of left knee 08/22/2016   Paresthesia 08/10/2016   Weakness 08/10/2016   Depression 07/31/2016   S/P thyroidectomy 07/31/2016   Primary osteoarthritis of left knee 07/31/2016   S/P  cervical spinal fusion 07/31/2016   S/P total knee arthroplasty, right 07/31/2016   SUBLUXATION, RADIAL HEAD 10/27/2009   CONTUSION, ELBOW 10/27/2009    Past Surgical History:  Procedure Laterality Date   ABDOMINAL HYSTERECTOMY     BIOPSY  10/02/2021   Procedure: BIOPSY;  Surgeon: Lanelle Bal, DO;  Location: [facility];  Service: Endoscopy;;   CATARACT EXTRACTION W/PHACO Right 01/22/2019   Procedure: CATARACT EXTRACTION PHACO AND INTRAOCULAR LENS PLACEMENT (IOC);  Surgeon: Fabio Pierce, MD;  Location: [facility];  Service: Ophthalmology;  Laterality: Right;  right, CDE: 19.63   CATARACT EXTRACTION W/PHACO Left 02/16/2019   Procedure: CATARACT EXTRACTION PHACO AND INTRAOCULAR LENS PLACEMENT LEFT EYE (CDE: 14.36);  Surgeon: Fabio Pierce, MD;  Location: [facility];  Service: Ophthalmology;  Laterality: Left;   CERVICAL FUSION  1990's   COLON SURGERY     COLONOSCOPY WITH PROPOFOL N/A 10/02/2021   Procedure: COLONOSCOPY WITH PROPOFOL;  Surgeon: Lanelle Bal, DO;  Location: [facility];  Service: Endoscopy;  Laterality: N/A;  11:45am   ESOPHAGOGASTRODUODENOSCOPY (EGD) WITH PROPOFOL N/A 10/02/2021   Procedure: ESOPHAGOGASTRODUODENOSCOPY (EGD) WITH PROPOFOL;  Surgeon: Lanelle Bal, DO;  Location: [facility];  Service: Endoscopy;  Laterality: N/A;   FOOT SURGERY Left    reshaped   HERNIA REPAIR     HIP ARTHROPLASTY Left    JOINT REPLACEMENT Right    hip   LUMBAR SPINE SURGERY  REPLACEMENT TOTAL KNEE Right    THYROIDECTOMY, PARTIAL     TOTAL KNEE ARTHROPLASTY Left 08/21/2016   Procedure: LEFT TOTAL KNEE ARTHROPLASTY;  Surgeon: Sheral Apley, MD;  Location: [facility];  Service: Orthopedics;  Laterality: Left;    OB History   No obstetric history on file.      Home Medications    Prior to Admission medications   Medication Sig Start Date End Date Taking? Authorizing Provider  ALPRAZolam Prudy Feeler) 0.25 MG tablet Take 0.25 mg by mouth daily as needed for anxiety.   Yes  [provider]  amLODipine (NORVASC) 5 MG tablet Take 1 tablet (5 mg total) by mouth daily. 02/15/23 05/16/23 Yes BranchDorothe Pea, MD  amoxicillin-clavulanate (AUGMENTIN) 875-125 MG tablet Take 1 tablet by mouth every 12 (twelve) hours. 04/05/23  Yes Particia Nearing, PA-C  aspirin EC 81 MG tablet Take 81 mg by mouth daily. Swallow whole.   Yes [provider]  diclofenac (VOLTAREN) 75 MG EC tablet Take 75 mg by mouth in the morning and at bedtime. 02/10/21  Yes [provider]  docusate sodium (COLACE) 100 MG capsule Take 300 mg by mouth in the morning.   Yes [provider]  donepezil (ARICEPT) 5 MG tablet Take 5 mg by mouth at bedtime. 01/29/21  Yes [provider]  DULoxetine (CYMBALTA) 60 MG capsule Take 60 mg by mouth every morning. 06/17/19  Yes [provider]  famotidine (PEPCID) 20 MG tablet One after supper 02/12/22  Yes Nyoka Cowden, MD  hydrALAZINE (APRESOLINE) 50 MG tablet Take 1 tablet (50 mg total) by mouth 2 (two) times daily. 11/10/20  Yes Branch, Dorothe Pea, MD  HYDROcodone-acetaminophen (NORCO/VICODIN) 5-325 MG tablet Take 1 tablet by mouth 2 (two) times daily as needed for moderate pain. 05/09/22  Yes Particia Nearing, PA-C  levothyroxine (SYNTHROID) 50 MCG tablet Take 50 mcg by mouth daily before breakfast.  06/01/16  Yes [provider]  losartan (COZAAR) 50 MG tablet Take 50 mg by mouth every evening. 02/24/21  Yes [provider]  Magnesium 250 MG TABS Take 250 mg by mouth daily.   Yes [provider]  nebivolol (BYSTOLIC) 10 MG tablet Take 1 tablet (10 mg total) by mouth at bedtime. 06/23/19  Yes Emokpae, Courage, MD  omeprazole (PRILOSEC) 20 MG capsule Take 20 mg by mouth every morning. 03/22/22  Yes [provider]  pravastatin (PRAVACHOL) 80 MG tablet Take 1 tablet (80 mg total) by mouth daily. 02/15/23  Yes Branch, Dorothe Pea, MD  predniSONE (DELTASONE) 10 MG tablet Take 6  tabs daily x 2 days, 5 tabs daily x 2 days, 4 tabs daily x 2 days, etc 04/29/23  Yes Particia Nearing, PA-C  traMADol (ULTRAM) 50 MG tablet Take 50 mg by mouth 2 (two) times daily as needed (pain.). 01/30/21  Yes [provider]  triamcinolone cream (KENALOG) 0.1 % Apply 1 Application topically 2 (two) times daily. Avoid use on face or private areas 04/29/23  Yes Particia Nearing, PA-C  benzonatate (TESSALON) 100 MG capsule Take 1 capsule (100 mg total) by mouth every 8 (eight) hours. 04/05/23   Particia Nearing, PA-C  Biotin w/ Vitamins C & E (HAIR/SKIN/NAILS PO) Take 3 tablets by mouth daily. Gummy    [provider]  carboxymethylcellulose (REFRESH PLUS) 0.5 % SOLN 1-2 drops 3 (three) times daily as needed (dry/irritated eyes).    [provider]  ferrous sulfate 325 (65 FE) MG tablet  Take 1 tablet (325 mg total) by mouth daily. 09/17/21 09/17/22  Letta Median, PA-C  GEMTESA 75 MG TABS Take 1 tablet by mouth daily. 11/27/21   [provider]    Family History Family History  Problem Relation Age of Onset   Colon cancer Neg Hx    Colon polyps Neg Hx     Social History Social History   Tobacco Use   Smoking status: Never   Smokeless tobacco: Never  Vaping Use   Vaping status: Never Used  Substance Use Topics   Alcohol use: No   Drug use: No     Allergies   Carvedilol and Sulfamethoxazole   Review of Systems Review of Systems Per HPI  Physical Exam Triage Vital Signs ED Triage Vitals [04/29/23 1343]  Encounter Vitals Group     BP 131/73     Systolic BP Percentile      Diastolic BP Percentile      Pulse Rate 76     Resp 20     Temp 97.6 F (36.4 C)     Temp Source Oral     SpO2 95 %     Weight      Height      Head Circumference      Peak Flow      Pain Score 0     Pain Loc      Pain Education      Exclude from Growth Chart    No data found.  Updated Vital Signs BP 131/73 (BP Location: Right Arm)    Pulse 76   Temp 97.6 F (36.4 C) (Oral)   Resp 20   SpO2 95%   Visual Acuity Right Eye Distance:   Left Eye Distance:   Bilateral Distance:    Right Eye Near:   Left Eye Near:    Bilateral Near:     Physical Exam Vitals and nursing note reviewed.  Constitutional:      Appearance: Normal appearance. She is not ill-appearing.  HENT:     Head: Atraumatic.  Eyes:     Extraocular Movements: Extraocular movements intact.     Conjunctiva/sclera: Conjunctivae normal.  Cardiovascular:     Rate and Rhythm: Normal rate and regular rhythm.     Heart sounds: Normal heart sounds.  Pulmonary:     Effort: Pulmonary effort is normal.     Breath sounds: Normal breath sounds.  Musculoskeletal:        General: Normal range of motion.     Cervical back: Normal range of motion and neck supple.  Skin:    General: Skin is warm and dry.     Findings: Rash present.     Comments: Erythematous pinpoint papular rash and ulcerations widespread across majority of body  Neurological:     Mental Status: She is alert and oriented to person, place, and time.     Motor: No weakness.     Gait: Gait normal.  Psychiatric:        Mood and Affect: Mood normal.        Thought Content: Thought content normal.        Judgment: Judgment normal.      UC Treatments / Results  Labs (all labs ordered are listed, but only abnormal results are displayed) Labs Reviewed - No data to display  EKG   Radiology No results found.  Procedures Procedures (including critical care time)  Medications Ordered in UC Medications - No data to display  Initial Impression / Assessment and Plan / UC Course  I have reviewed the triage vital signs and the nursing notes.  Pertinent labs & imaging results that were available during my care of the patient were reviewed by me and considered in my medical decision making (see chart for details).     Consistent with an irritant/contact dermatitis.  Unclear etiology at  this time.  Treat with extended prednisone taper, triamcinolone cream, good moisturization and avoidance of potential irritants.  Return for worsening symptoms.  Final Clinical Impressions(s) / UC Diagnoses   Final diagnoses:  Rash   Discharge Instructions   None    ED Prescriptions     Medication Sig Dispense Auth. Provider   triamcinolone cream (KENALOG) 0.1 % Apply 1 Application topically 2 (two) times daily. Avoid use on face or private areas 80 g Particia Nearing, [place]   predniSONE (DELTASONE) 10 MG tablet Take 6 tabs daily x 2 days, 5 tabs daily x 2 days, 4 tabs daily x 2 days, etc 42 tablet Particia Nearing, [place]      PDMP not reviewed this encounter.   Particia Nearing, [place] 04/29/23 1407

## 2023-04-29 NOTE — ED Triage Notes (Signed)
Itchy rash all over body that started 2 weeks ago. Pt states she has been to dermatologist and nothing has worked.

## 2023-05-01 ENCOUNTER — Encounter (HOSPITAL_COMMUNITY): Payer: Self-pay

## 2023-05-01 ENCOUNTER — Emergency Department (HOSPITAL_COMMUNITY): Payer: Medicare PPO

## 2023-05-01 ENCOUNTER — Emergency Department (HOSPITAL_COMMUNITY)
Admission: EM | Admit: 2023-05-01 | Discharge: 2023-05-01 | Disposition: A | Discharge status: Home / Self Care | Payer: Medicare PPO | Attending: Emergency Medicine | Admitting: Emergency Medicine

## 2023-05-01 ENCOUNTER — Other Ambulatory Visit: Payer: Self-pay

## 2023-05-01 DIAGNOSIS — W19XXXA Unspecified fall, initial encounter: Secondary | ICD-10-CM

## 2023-05-01 DIAGNOSIS — Z7982 Long term (current) use of aspirin: Secondary | ICD-10-CM | POA: Diagnosis not present

## 2023-05-01 DIAGNOSIS — Z8673 Personal history of transient ischemic attack (TIA), and cerebral infarction without residual deficits: Secondary | ICD-10-CM | POA: Insufficient documentation

## 2023-05-01 DIAGNOSIS — S0083XA Contusion of other part of head, initial encounter: Secondary | ICD-10-CM | POA: Diagnosis not present

## 2023-05-01 DIAGNOSIS — E039 Hypothyroidism, unspecified: Secondary | ICD-10-CM | POA: Diagnosis not present

## 2023-05-01 DIAGNOSIS — W01198A Fall on same level from slipping, tripping and stumbling with subsequent striking against other object, initial encounter: Secondary | ICD-10-CM | POA: Diagnosis not present

## 2023-05-01 DIAGNOSIS — S0990XA Unspecified injury of head, initial encounter: Secondary | ICD-10-CM | POA: Diagnosis present

## 2023-05-01 DIAGNOSIS — Z79899 Other long term (current) drug therapy: Secondary | ICD-10-CM | POA: Diagnosis not present

## 2023-05-01 DIAGNOSIS — R21 Rash and other nonspecific skin eruption: Secondary | ICD-10-CM | POA: Insufficient documentation

## 2023-05-01 DIAGNOSIS — Z96641 Presence of right artificial hip joint: Secondary | ICD-10-CM | POA: Insufficient documentation

## 2023-05-01 DIAGNOSIS — I10 Essential (primary) hypertension: Secondary | ICD-10-CM | POA: Diagnosis not present

## 2023-05-01 DIAGNOSIS — T148XXA Other injury of unspecified body region, initial encounter: Secondary | ICD-10-CM

## 2023-05-01 DIAGNOSIS — Z8616 Personal history of COVID-19: Secondary | ICD-10-CM | POA: Diagnosis not present

## 2023-05-01 NOTE — Discharge Instructions (Signed)
We evaluated you after your fall.  We obtained CT scans of your head and neck.  Your CT scans did not show any bleeding in your brain or other dangerous problems.  Please be careful when ambulating to avoid further falls.  Please take 1000 g of Tylenol every 6 hours as needed for pain.  You can also apply ice to areas of the swelling and bruising.

## 2023-05-01 NOTE — ED Triage Notes (Signed)
Rcesm from home cc of tripping over her yorkie while feeding it. Has tennis ball hematoma to left forehead. Not on any blood thinners. Takes 181mg  aspirin. Had 12oz of white wine tonight. No loc. No pain

## 2023-05-01 NOTE — ED Provider Notes (Signed)
[place] [facility] AT [facility] Provider Note  CSN: [id number] Arrival date & time: 05/01/23 2204  Chief Complaint(s) Fall  HPI [name] is a 77 y.o. female with history of hypertension, hyperlipidemia presenting to the emergency department with fall.  Patient reports that she tripped over her dog.  She landed on her forehead.  She denies loss of consciousness.  She was unable to get up on her own.  She does endorse drinking alcohol tonight.  She reports she feels overall pretty good.  She denies headache, neck pain, chest pain, back pain, abdominal pain, pain in her arms or pain in her legs.   Past Medical History Past Medical History:  Diagnosis Date   Anemia    Anxiety    Arthritis    Complication of anesthesia    "pt. holds her breath when waking up"   Depression    GERD (gastroesophageal reflux disease)    Hypercholesteremia    Hypertension    Hypothyroidism    Osteoporosis    PONV (postoperative nausea and vomiting)    Staph infection    abdomen and returned 5 yrs. later   TIA (transient ischemic attack)    Patient Active Problem List   Diagnosis Date Noted   Upper airway cough syndrome 02/12/2022   DOE (dyspnea on exertion) 02/12/2022   Morbid obesity due to excess calories (HCC) 02/12/2022   Positive colorectal cancer screening using Cologuard test 09/15/2021   Anemia 09/15/2021   COVID-19 virus infection 06/23/2019   Transient speech disturbance 06/22/2019   Hypothyroidism    Migraine equivalent 04/16/2017   Anxiety 04/16/2017   Palpitation 04/16/2017   Hyperlipidemia 04/16/2017   Essential hypertension 03/06/2017   Hypokalemia 03/06/2017   Hyponatremia 03/06/2017   TIA (transient ischemic attack) 03/05/2017   Primary localized osteoarthritis of left knee 08/22/2016   Paresthesia 08/10/2016   Weakness 08/10/2016   Depression 07/31/2016   S/P thyroidectomy 07/31/2016   Primary osteoarthritis of left knee 07/31/2016   S/P  cervical spinal fusion 07/31/2016   S/P total knee arthroplasty, right 07/31/2016   SUBLUXATION, RADIAL HEAD 10/27/2009   CONTUSION, ELBOW 10/27/2009   Home Medication(s) Prior to Admission medications   Medication Sig Start Date End Date Taking? Authorizing Provider  ALPRAZolam Prudy Feeler) 0.25 MG tablet Take 0.25 mg by mouth daily as needed for anxiety.    [provider]  amLODipine (NORVASC) 5 MG tablet Take 1 tablet (5 mg total) by mouth daily. 02/15/23 05/16/23  Antoine Poche, MD  amoxicillin-clavulanate (AUGMENTIN) 875-125 MG tablet Take 1 tablet by mouth every 12 (twelve) hours. 04/05/23   Particia Nearing, PA-C  aspirin EC 81 MG tablet Take 81 mg by mouth daily. Swallow whole.    [provider]  benzonatate (TESSALON) 100 MG capsule Take 1 capsule (100 mg total) by mouth every 8 (eight) hours. 04/05/23   Particia Nearing, PA-C  Biotin w/ Vitamins C & E (HAIR/SKIN/NAILS PO) Take 3 tablets by mouth daily. Gummy    [provider]  carboxymethylcellulose (REFRESH PLUS) 0.5 % SOLN 1-2 drops 3 (three) times daily as needed (dry/irritated eyes).    [provider]  diclofenac (VOLTAREN) 75 MG EC tablet Take 75 mg by mouth in the morning and at bedtime. 02/10/21   [provider]  docusate sodium (COLACE) 100 MG capsule Take 300 mg by mouth in the morning.    [provider]  donepezil (ARICEPT) 5 MG tablet Take 5 mg by mouth at  bedtime. 01/29/21   [provider]  DULoxetine (CYMBALTA) 60 MG capsule Take 60 mg by mouth every morning. 06/17/19   [provider]  famotidine (PEPCID) 20 MG tablet One after supper 02/12/22   Nyoka Cowden, MD  ferrous sulfate 325 (65 FE) MG tablet Take 1 tablet (325 mg total) by mouth daily. 09/17/21 09/17/22  Letta Median, PA-C  GEMTESA 75 MG TABS Take 1 tablet by mouth daily. 11/27/21   [provider]  hydrALAZINE (APRESOLINE) 50 MG tablet Take 1 tablet (50 mg total) by  mouth 2 (two) times daily. 11/10/20   Antoine Poche, MD  HYDROcodone-acetaminophen (NORCO/VICODIN) 5-325 MG tablet Take 1 tablet by mouth 2 (two) times daily as needed for moderate pain. 05/09/22   Particia Nearing, PA-C  levothyroxine (SYNTHROID) 50 MCG tablet Take 50 mcg by mouth daily before breakfast.  06/01/16   [provider]  losartan (COZAAR) 50 MG tablet Take 50 mg by mouth every evening. 02/24/21   [provider]  Magnesium 250 MG TABS Take 250 mg by mouth daily.    [provider]  nebivolol (BYSTOLIC) 10 MG tablet Take 1 tablet (10 mg total) by mouth at bedtime. 06/23/19   Shon Hale, MD  omeprazole (PRILOSEC) 20 MG capsule Take 20 mg by mouth every morning. 03/22/22   [provider]  pravastatin (PRAVACHOL) 80 MG tablet Take 1 tablet (80 mg total) by mouth daily. 02/15/23   Antoine Poche, MD  predniSONE (DELTASONE) 10 MG tablet Take 6 tabs daily x 2 days, 5 tabs daily x 2 days, 4 tabs daily x 2 days, etc 04/29/23   Particia Nearing, PA-C  traMADol (ULTRAM) 50 MG tablet Take 50 mg by mouth 2 (two) times daily as needed (pain.). 01/30/21   [provider]  triamcinolone cream (KENALOG) 0.1 % Apply 1 Application topically 2 (two) times daily. Avoid use on face or private areas 04/29/23   Particia Nearing, [place]                                                                                                                                    Past Surgical History Past Surgical History:  Procedure Laterality Date   ABDOMINAL HYSTERECTOMY     BIOPSY  10/02/2021   Procedure: BIOPSY;  Surgeon: Lanelle Bal, DO;  Location: [facility];  Service: Endoscopy;;   CATARACT EXTRACTION W/PHACO Right 01/22/2019   Procedure: CATARACT EXTRACTION PHACO AND INTRAOCULAR LENS PLACEMENT (IOC);  Surgeon: Fabio Pierce, MD;  Location: [facility] ORS;  Service: Ophthalmology;  Laterality: Right;  right, CDE: 19.63   CATARACT EXTRACTION  W/PHACO Left 02/16/2019   Procedure: CATARACT EXTRACTION PHACO AND INTRAOCULAR LENS PLACEMENT LEFT EYE (CDE: 14.36);  Surgeon: Fabio Pierce, MD;  Location: [facility];  Service: Ophthalmology;  Laterality: Left;   CERVICAL FUSION  1990's   COLON SURGERY     COLONOSCOPY  WITH PROPOFOL N/A 10/02/2021   Procedure: COLONOSCOPY WITH PROPOFOL;  Surgeon: Lanelle Bal, DO;  Location: [facility];  Service: Endoscopy;  Laterality: N/A;  11:45am   ESOPHAGOGASTRODUODENOSCOPY (EGD) WITH PROPOFOL N/A 10/02/2021   Procedure: ESOPHAGOGASTRODUODENOSCOPY (EGD) WITH PROPOFOL;  Surgeon: Lanelle Bal, DO;  Location: [facility];  Service: Endoscopy;  Laterality: N/A;   FOOT SURGERY Left    reshaped   HERNIA REPAIR     HIP ARTHROPLASTY Left    JOINT REPLACEMENT Right    hip   LUMBAR SPINE SURGERY     REPLACEMENT TOTAL KNEE Right    THYROIDECTOMY, PARTIAL     TOTAL KNEE ARTHROPLASTY Left 08/21/2016   Procedure: LEFT TOTAL KNEE ARTHROPLASTY;  Surgeon: Sheral Apley, MD;  Location: [facility] OR;  Service: Orthopedics;  Laterality: Left;   Family History Family History  Problem Relation Age of Onset   Colon cancer Neg Hx    Colon polyps Neg Hx     Social History Social History   Tobacco Use   Smoking status: Never   Smokeless tobacco: Never  Vaping Use   Vaping status: Never Used  Substance Use Topics   Alcohol use: No   Drug use: No   Allergies Carvedilol and Sulfamethoxazole  Review of Systems Review of Systems  All other systems reviewed and are negative.   Physical Exam Vital Signs  I have reviewed the triage vital signs BP 132/62 (BP Location: Left Arm)   Pulse 87   Temp (!) 97.5 F (36.4 C)   Resp 16   Ht 4\' 10"  (1.473 m)   Wt 104.2 kg   SpO2 96%   BMI 48.01 kg/m  Physical Exam Vitals and nursing note reviewed.  Constitutional:      General: She is not in acute distress.    Appearance: She is well-developed.  HENT:     Head: Normocephalic.     Comments: Approximately  3 cm hematoma to the left forehead    Mouth/Throat:     Mouth: Mucous membranes are moist.  Eyes:     Pupils: Pupils are equal, round, and reactive to light.  Cardiovascular:     Rate and Rhythm: Normal rate and regular rhythm.     Heart sounds: No murmur heard. Pulmonary:     Effort: Pulmonary effort is normal. No respiratory distress.     Breath sounds: Normal breath sounds.  Abdominal:     General: Abdomen is flat.     Palpations: Abdomen is soft.     Tenderness: There is no abdominal tenderness.  Musculoskeletal:        General: No tenderness.     Right lower leg: No edema.     Left lower leg: No edema.     Comments: No midline C, T, L-spine tenderness.  No chest wall tenderness or crepitus.  Full painless range of motion at the bilateral upper extremities including the shoulders, elbows, wrists, hand and fingers, and in the bilateral lower extremities including the hips, knees, ankle, toes.  No focal bony tenderness, injury or deformity.   Skin:    General: Skin is warm and dry.     Comments: Scattered excoriated rash  Neurological:     General: No focal deficit present.     Mental Status: She is alert. Mental status is at baseline.  Psychiatric:        Mood and Affect: Mood normal.        Behavior: Behavior normal.  ED Results and Treatments Labs (all labs ordered are listed, but only abnormal results are displayed) Labs Reviewed - No data to display                                                                                                                        Radiology CT Head Wo Contrast  Result Date: 05/01/2023 CLINICAL DATA:  Larey Seat and hit the left front of the head. EXAM: CT HEAD WITHOUT CONTRAST TECHNIQUE: Contiguous axial images were obtained from the base of the skull through the vertex without intravenous contrast. RADIATION DOSE REDUCTION: This exam was performed according to the departmental dose-optimization program which includes automated  exposure control, adjustment of the mA and/or kV according to patient size and/or use of iterative reconstruction technique. COMPARISON:  Head CT 02/01/2020. FINDINGS: Brain: No evidence of acute infarction, hemorrhage, hydrocephalus, extra-axial collection or mass lesion/mass effect. There is mild global atrophy, mild small-vessel disease of the cerebral white matter, mild atrophic ventriculomegaly without midline shift. There are benign dural calcifications along the falx at the vertex. Vascular: There are calcific plaques in the carotid siphons. No hyperdense central vessel is seen. Skull: Negative for fractures or focal lesions. There is a large left frontal scalp hematoma. Sinuses/Orbits: No acute finding. Old lens replacements. Clear sinuses and mastoids. Other: Midline nasal septum. IMPRESSION: 1. No acute intracranial CT findings or depressed skull fractures. 2. Large left frontal scalp hematoma. 3. Atrophy and small-vessel disease. 4. Carotid atherosclerosis. Electronically Signed   By: Almira Bar M.D.   On: 05/01/2023 22:54   CT Cervical Spine Wo Contrast  Result Date: 05/01/2023 CLINICAL DATA:  Neck trauma fall EXAM: CT CERVICAL SPINE WITHOUT CONTRAST TECHNIQUE: Multidetector CT imaging of the cervical spine was performed without intravenous contrast. Multiplanar CT image reconstructions were also generated. RADIATION DOSE REDUCTION: This exam was performed according to the departmental dose-optimization program which includes automated exposure control, adjustment of the mA and/or kV according to patient size and/or use of iterative reconstruction technique. COMPARISON:  Neck CTA 06/22/2019 FINDINGS: Alignment: 5 mm anterolisthesis C7-T1, chronic. Facet alignment is within normal limits Skull base and vertebrae: No acute fracture. No primary bone lesion or focal pathologic process. Soft tissues and spinal canal: No prevertebral fluid or swelling. No visible canal hematoma. Disc levels: Solid  bone fusion C5 through C7. Advanced disc space narrowing C3-C4, C4-C5 and C5-C6. Moderate C2-C3 disc space narrowing. Facet degenerative changes at multiple levels. Upper chest: Negative. Other: None IMPRESSION: 1. No CT evidence for acute osseous abnormality. 2. Fusion C5 through C7. Multilevel degenerative changes. Electronically Signed   By: Jasmine Pang M.D.   On: 05/01/2023 22:53    Pertinent labs & imaging results that were available during my care of the patient were reviewed by me and considered in my medical decision making (see MDM for details).  Medications Ordered in ED Medications - No data to display  Procedures Procedures  (including critical care time)  Medical Decision Making / ED Course   MDM:  77 year old female presenting to the emergency department after fall.  Patient is overall quite well-appearing.  She does endorse that she had a trip and fall rather than any kind of syncopal event.  She reports she tripped over her dog.  Denies loss of consciousness.  She currently really denies any symptoms at all.  She does have a hematoma to her forehead.  Given this and her age will obtain CT scan of the head and neck.  She endorses drinking some wine today, however patient is not clinically intoxicated.  If CT scans are reassuring, anticipate getting patient up and ambulatory.  She does have a scattered excoriated rash.  She has been seen at urgent care for this.  Advised primary care follow-up.  Does not appear to be any dangerous type of rash such as SJS, cellulitis, TE N, drug eruption.  Clinical Course as of 05/01/23 2325  Wed May 01, 2023  2324 CT scans were negative.  Patient was able to ambulate without difficulties and without evidence of any new injuries. Will discharge patient to home. All questions answered. Patient comfortable with plan  of discharge. Return precautions discussed with patient and specified on the after visit summary.  [WS]    Clinical Course User Index [WS] Lonell Grandchild, MD     Additional history obtained: -Additional history obtained from ems -External records from outside source obtained and reviewed including: Chart review including previous notes, labs, imaging, consultation notes including prior UC visit for rash    Imaging Studies ordered: I ordered imaging studies including CT head  On my interpretation imaging demonstrates forehead hematoma w/o ICH  I independently visualized and interpreted imaging. I agree with the radiologist interpretation   Medicines ordered and prescription drug management: No orders of the defined types were placed in this encounter.   -I have reviewed the patients home medicines and have made adjustments as needed  Cardiac Monitoring: The patient was maintained on a cardiac monitor.  I personally viewed and interpreted the cardiac monitored which showed an underlying rhythm of: NSR  Social Determinants of Health:  Diagnosis or treatment significantly limited by social determinants of health: obesity and alcohol use   Reevaluation: After the interventions noted above, I reevaluated the patient and found that their symptoms have improved  Co morbidities that complicate the patient evaluation  Past Medical History:  Diagnosis Date   Anemia    Anxiety    Arthritis    Complication of anesthesia    "pt. holds her breath when waking up"   Depression    GERD (gastroesophageal reflux disease)    Hypercholesteremia    Hypertension    Hypothyroidism    Osteoporosis    PONV (postoperative nausea and vomiting)    Staph infection    abdomen and returned 5 yrs. later   TIA (transient ischemic attack)       Dispostion: Disposition decision including need for hospitalization was considered, and patient discharged from emergency department.    Final  Clinical Impression(s) / ED Diagnoses Final diagnoses:  Fall, initial encounter  Hematoma     This chart was dictated using voice recognition software.  Despite best efforts to proofread,  errors can occur which can change the documentation meaning.    Lonell Grandchild, MD 05/01/23 2325

## 2023-05-09 ENCOUNTER — Other Ambulatory Visit: Payer: Self-pay | Admitting: Family Medicine

## 2023-05-09 DIAGNOSIS — M1990 Unspecified osteoarthritis, unspecified site: Secondary | ICD-10-CM | POA: Diagnosis not present

## 2023-05-09 DIAGNOSIS — R32 Unspecified urinary incontinence: Secondary | ICD-10-CM | POA: Diagnosis not present

## 2023-05-09 DIAGNOSIS — K5900 Constipation, unspecified: Secondary | ICD-10-CM | POA: Diagnosis not present

## 2023-05-09 DIAGNOSIS — Z9181 History of falling: Secondary | ICD-10-CM | POA: Diagnosis not present

## 2023-05-09 DIAGNOSIS — Z5982 Transportation insecurity: Secondary | ICD-10-CM | POA: Diagnosis not present

## 2023-05-09 DIAGNOSIS — G3184 Mild cognitive impairment, so stated: Secondary | ICD-10-CM | POA: Diagnosis not present

## 2023-05-09 DIAGNOSIS — Z791 Long term (current) use of non-steroidal anti-inflammatories (NSAID): Secondary | ICD-10-CM | POA: Diagnosis not present

## 2023-05-09 DIAGNOSIS — Z87440 Personal history of urinary (tract) infections: Secondary | ICD-10-CM | POA: Diagnosis not present

## 2023-05-09 DIAGNOSIS — F411 Generalized anxiety disorder: Secondary | ICD-10-CM | POA: Diagnosis not present

## 2023-05-09 DIAGNOSIS — I499 Cardiac arrhythmia, unspecified: Secondary | ICD-10-CM | POA: Diagnosis not present

## 2023-05-09 DIAGNOSIS — Z79890 Hormone replacement therapy (postmenopausal): Secondary | ICD-10-CM | POA: Diagnosis not present

## 2023-05-09 DIAGNOSIS — Z882 Allergy status to sulfonamides status: Secondary | ICD-10-CM | POA: Diagnosis not present

## 2023-05-09 DIAGNOSIS — I10 Essential (primary) hypertension: Secondary | ICD-10-CM | POA: Diagnosis not present

## 2023-05-09 DIAGNOSIS — K219 Gastro-esophageal reflux disease without esophagitis: Secondary | ICD-10-CM | POA: Diagnosis not present

## 2023-05-09 DIAGNOSIS — F325 Major depressive disorder, single episode, in full remission: Secondary | ICD-10-CM | POA: Diagnosis not present

## 2023-05-09 DIAGNOSIS — E039 Hypothyroidism, unspecified: Secondary | ICD-10-CM | POA: Diagnosis not present

## 2023-05-09 DIAGNOSIS — E785 Hyperlipidemia, unspecified: Secondary | ICD-10-CM | POA: Diagnosis not present

## 2023-05-09 DIAGNOSIS — Z8673 Personal history of transient ischemic attack (TIA), and cerebral infarction without residual deficits: Secondary | ICD-10-CM | POA: Diagnosis not present

## 2023-05-13 DIAGNOSIS — R2681 Unsteadiness on feet: Secondary | ICD-10-CM | POA: Diagnosis not present

## 2023-05-13 DIAGNOSIS — M6281 Muscle weakness (generalized): Secondary | ICD-10-CM | POA: Diagnosis not present

## 2023-05-17 DIAGNOSIS — R2681 Unsteadiness on feet: Secondary | ICD-10-CM | POA: Diagnosis not present

## 2023-05-17 DIAGNOSIS — M6281 Muscle weakness (generalized): Secondary | ICD-10-CM | POA: Diagnosis not present

## 2023-05-22 DIAGNOSIS — R2681 Unsteadiness on feet: Secondary | ICD-10-CM | POA: Diagnosis not present

## 2023-05-22 DIAGNOSIS — M6281 Muscle weakness (generalized): Secondary | ICD-10-CM | POA: Diagnosis not present

## 2023-05-30 DIAGNOSIS — R3989 Other symptoms and signs involving the genitourinary system: Secondary | ICD-10-CM | POA: Diagnosis not present

## 2023-05-30 DIAGNOSIS — N390 Urinary tract infection, site not specified: Secondary | ICD-10-CM | POA: Diagnosis not present

## 2023-06-13 ENCOUNTER — Telehealth: Payer: Self-pay | Admitting: Nurse Practitioner

## 2023-06-13 ENCOUNTER — Ambulatory Visit
Admission: EM | Admit: 2023-06-13 | Discharge: 2023-06-13 | Disposition: A | Discharge status: Home / Self Care | Payer: Medicare PPO | Attending: Internal Medicine | Admitting: Internal Medicine

## 2023-06-13 DIAGNOSIS — N390 Urinary tract infection, site not specified: Secondary | ICD-10-CM | POA: Insufficient documentation

## 2023-06-13 LAB — POCT URINALYSIS DIP (MANUAL ENTRY)
Bilirubin, UA: NEGATIVE
Glucose, UA: NEGATIVE mg/dL
Ketones, POC UA: NEGATIVE mg/dL
Nitrite, UA: NEGATIVE
Protein Ur, POC: 30 mg/dL — AB
Spec Grav, UA: 1.025 (ref 1.010–1.025)
Urobilinogen, UA: 0.2 U/dL
pH, UA: 6 (ref 5.0–8.0)

## 2023-06-13 MED ORDER — FOSFOMYCIN TROMETHAMINE 3 G PO PACK: 3.0000 g | PACK | ORAL | 0 refills | Status: AC

## 2023-06-13 MED ORDER — CIPROFLOXACIN HCL 250 MG PO TABS: 250.0000 mg | ORAL_TABLET | Freq: Two times a day (BID) | ORAL | 0 refills | Status: AC

## 2023-06-13 NOTE — ED Triage Notes (Signed)
 Pt reports she has odd color urine ad vaginal odor x 4 days

## 2023-06-13 NOTE — ED Provider Notes (Signed)
 [facility]    CSN: [id number] Arrival date & time: 06/13/23  1251      History   Chief Complaint Chief Complaint  Patient presents with   Urinary Tract Infection    HPI [name] is a 78 y.o. female.   Patient presents today for approximately 3-week history of changes in urination.  She endorses urinary frequency and urgency as well as dark-colored urine and vaginal odor for the past few days.  She was seen by her primary care provider 2 weeks ago and was treated with Keflex  for UTI reports symptoms did not fully improve.  She also endorses nausea and dry heaving, however no vomiting.  No fever, flank pain, or known vaginal discharge.  Reports she has frequent UTIs and typically takes Keflex  with improvement.  Denies antibiotic use in the past 90 days other than the Keflex .  No allergies to antibiotic therapy.    Past Medical History:  Diagnosis Date   Anemia    Anxiety    Arthritis    Complication of anesthesia    pt. holds her breath when waking up   Depression    GERD (gastroesophageal reflux disease)    Hypercholesteremia    Hypertension    Hypothyroidism    Osteoporosis    PONV (postoperative nausea and vomiting)    Staph infection    abdomen and returned 5 yrs. later   TIA (transient ischemic attack)     Patient Active Problem List   Diagnosis Date Noted   Upper airway cough syndrome 02/12/2022   DOE (dyspnea on exertion) 02/12/2022   Morbid obesity due to excess calories (HCC) 02/12/2022   Positive colorectal cancer screening using Cologuard test 09/15/2021   Anemia 09/15/2021   COVID-19 virus infection 06/23/2019   Transient speech disturbance 06/22/2019   Hypothyroidism    Migraine equivalent 04/16/2017   Anxiety 04/16/2017   Palpitation 04/16/2017   Hyperlipidemia 04/16/2017   Essential hypertension 03/06/2017   Hypokalemia 03/06/2017   Hyponatremia 03/06/2017   TIA (transient ischemic attack) 03/05/2017   Primary localized  osteoarthritis of left knee 08/22/2016   Paresthesia 08/10/2016   Weakness 08/10/2016   Depression 07/31/2016   S/P thyroidectomy 07/31/2016   Primary osteoarthritis of left knee 07/31/2016   S/P cervical spinal fusion 07/31/2016   S/P total knee arthroplasty, right 07/31/2016   SUBLUXATION, RADIAL HEAD 10/27/2009   CONTUSION, ELBOW 10/27/2009    Past Surgical History:  Procedure Laterality Date   ABDOMINAL HYSTERECTOMY     BIOPSY  10/02/2021   Procedure: BIOPSY;  Surgeon: Cindie Carlin POUR, DO;  Location: [facility];  Service: Endoscopy;;   CATARACT EXTRACTION W/PHACO Right 01/22/2019   Procedure: CATARACT EXTRACTION PHACO AND INTRAOCULAR LENS PLACEMENT (IOC);  Surgeon: Harrie Agent, MD;  Location: [facility];  Service: Ophthalmology;  Laterality: Right;  right, CDE: 19.63   CATARACT EXTRACTION W/PHACO Left 02/16/2019   Procedure: CATARACT EXTRACTION PHACO AND INTRAOCULAR LENS PLACEMENT LEFT EYE (CDE: 14.36);  Surgeon: Harrie Agent, MD;  Location: [facility];  Service: Ophthalmology;  Laterality: Left;   CERVICAL FUSION  1990's   COLON SURGERY     COLONOSCOPY WITH PROPOFOL  N/A 10/02/2021   Procedure: COLONOSCOPY WITH PROPOFOL ;  Surgeon: Cindie Carlin POUR, DO;  Location: [facility];  Service: Endoscopy;  Laterality: N/A;  11:45am   ESOPHAGOGASTRODUODENOSCOPY (EGD) WITH PROPOFOL  N/A 10/02/2021   Procedure: ESOPHAGOGASTRODUODENOSCOPY (EGD) WITH PROPOFOL ;  Surgeon: Cindie Carlin POUR, DO;  Location: [facility];  Service: Endoscopy;  Laterality: N/A;  FOOT SURGERY Left    reshaped   HERNIA REPAIR     HIP ARTHROPLASTY Left    JOINT REPLACEMENT Right    hip   LUMBAR SPINE SURGERY     REPLACEMENT TOTAL KNEE Right    THYROIDECTOMY, PARTIAL     TOTAL KNEE ARTHROPLASTY Left 08/21/2016   Procedure: LEFT TOTAL KNEE ARTHROPLASTY;  Surgeon: Evalene JONETTA Chancy, MD;  Location: [facility];  Service: Orthopedics;  Laterality: Left;    OB History   No obstetric history on file.      Home Medications     Prior to Admission medications   Medication Sig Start Date End Date Taking? Authorizing Provider  fosfomycin (MONUROL ) 3 g PACK Take 3 g by mouth every other day for 3 doses. 06/13/23 06/18/23 Yes Chandra Harlene LABOR, NP  ALPRAZolam (XANAX) 0.25 MG tablet Take 0.25 mg by mouth daily as needed for anxiety.    [provider]  amLODipine  (NORVASC ) 5 MG tablet Take 1 tablet (5 mg total) by mouth daily. 02/15/23 05/16/23  Alvan Dorn FALCON, MD  aspirin  EC 81 MG tablet Take 81 mg by mouth daily. Swallow whole.    [provider]  benzonatate  (TESSALON ) 100 MG capsule Take 1 capsule (100 mg total) by mouth every 8 (eight) hours. 04/05/23   Stuart Vernell Norris, PA-C  Biotin  w/ Vitamins C & E (HAIR/SKIN/NAILS PO) Take 3 tablets by mouth daily. Gummy    [provider]  carboxymethylcellulose (REFRESH PLUS) 0.5 % SOLN 1-2 drops 3 (three) times daily as needed (dry/irritated eyes).    [provider]  ciprofloxacin  (CIPRO ) 250 MG tablet Take 1 tablet (250 mg total) by mouth every 12 (twelve) hours for 3 days. 06/13/23 06/16/23  Chandra Harlene LABOR, NP  diclofenac  (VOLTAREN ) 75 MG EC tablet Take 75 mg by mouth in the morning and at bedtime. 02/10/21   [provider]  docusate sodium  (COLACE) 100 MG capsule Take 300 mg by mouth in the morning.    [provider]  donepezil (ARICEPT) 5 MG tablet Take 5 mg by mouth at bedtime. 01/29/21   [provider]  DULoxetine  (CYMBALTA ) 60 MG capsule Take 60 mg by mouth every morning. 06/17/19   [provider]  famotidine  (PEPCID ) 20 MG tablet One after supper 02/12/22   Darlean Ozell NOVAK, MD  ferrous sulfate  325 (65 FE) MG tablet Take 1 tablet (325 mg total) by mouth daily. 09/17/21 09/17/22  Rudy Josette RAMAN, PA-C  GEMTESA 75 MG TABS Take 1 tablet by mouth daily. 11/27/21   [provider]  hydrALAZINE  (APRESOLINE ) 50 MG tablet Take 1 tablet (50 mg total) by mouth 2 (two) times daily. 11/10/20    Alvan Dorn FALCON, MD  HYDROcodone -acetaminophen  (NORCO/VICODIN) 5-325 MG tablet Take 1 tablet by mouth 2 (two) times daily as needed for moderate pain. 05/09/22   Stuart Vernell Norris, PA-C  levothyroxine  (SYNTHROID ) 50 MCG tablet Take 50 mcg by mouth daily before breakfast.  06/01/16   [provider]  losartan  (COZAAR ) 50 MG tablet Take 50 mg by mouth every evening. 02/24/21   [provider]  Magnesium 250 MG TABS Take 250 mg by mouth daily.    [provider]  nebivolol  (BYSTOLIC ) 10 MG tablet Take 1 tablet (10 mg total) by mouth at bedtime. 06/23/19   Pearlean Manus, MD  omeprazole  (PRILOSEC) 20 MG capsule Take 20 mg by mouth every morning. 03/22/22   [provider]  pravastatin  (PRAVACHOL ) 80 MG tablet Take  1 tablet (80 mg total) by mouth daily. 02/15/23   Alvan Dorn FALCON, MD  predniSONE  (DELTASONE ) 10 MG tablet Take 6 tabs daily x 2 days, 5 tabs daily x 2 days, 4 tabs daily x 2 days, etc 04/29/23   Stuart Vernell Norris, PA-C  traMADol (ULTRAM) 50 MG tablet Take 50 mg by mouth 2 (two) times daily as needed (pain.). 01/30/21   [provider]  triamcinolone  cream (KENALOG ) 0.1 % Apply 1 Application topically 2 (two) times daily. Avoid use on face or private areas 04/29/23   Stuart Vernell Norris, PA-C    Family History Family History  Problem Relation Age of Onset   Colon cancer Neg Hx    Colon polyps Neg Hx     Social History Social History   Tobacco Use   Smoking status: Never   Smokeless tobacco: Never  Vaping Use   Vaping status: Never Used  Substance Use Topics   Alcohol use: No   Drug use: No     Allergies   Carvedilol and Sulfamethoxazole   Review of Systems Review of Systems Per HPI  Physical Exam Triage Vital Signs ED Triage Vitals  Encounter Vitals Group     BP 06/13/23 1406 (!) 162/87     Systolic BP Percentile --      Diastolic BP Percentile --      Pulse Rate 06/13/23 1406 83     Resp 06/13/23  1406 20     Temp 06/13/23 1406 97.6 F (36.4 C)     Temp Source 06/13/23 1406 Oral     SpO2 06/13/23 1406 92 %     Weight --      Height --      Head Circumference --      Peak Flow --      Pain Score 06/13/23 1338 0     Pain Loc --      Pain Education --      Exclude from Growth Chart --    No data found.  Updated Vital Signs BP (!) 162/87 (BP Location: Left Arm)   Pulse 83   Temp 97.6 F (36.4 C) (Oral)   Resp 20   SpO2 92%   Visual Acuity Right Eye Distance:   Left Eye Distance:   Bilateral Distance:    Right Eye Near:   Left Eye Near:    Bilateral Near:     Physical Exam Vitals and nursing note reviewed.  Constitutional:      General: She is not in acute distress.    Appearance: She is not toxic-appearing.  Pulmonary:     Effort: Pulmonary effort is normal. No respiratory distress.  Abdominal:     General: Abdomen is flat. Bowel sounds are normal. There is no distension.     Palpations: Abdomen is soft. There is no mass.     Tenderness: There is no abdominal tenderness. There is no right CVA tenderness, left CVA tenderness or guarding.  Skin:    General: Skin is warm and dry.     Coloration: Skin is not jaundiced or pale.     Findings: No erythema.  Neurological:     Mental Status: She is alert and oriented to person, place, and time.     Motor: No weakness.     Gait: Gait normal.  Psychiatric:        Behavior: Behavior is cooperative.      UC Treatments / Results  Labs (all labs ordered are listed, but  only abnormal results are displayed) Labs Reviewed  POCT URINALYSIS DIP (MANUAL ENTRY) - Abnormal; Notable for the following components:      Result Value   Color, UA straw (*)    Clarity, UA cloudy (*)    Blood, UA trace-intact (*)    Protein Ur, POC =30 (*)    Leukocytes, UA Trace (*)    All other components within normal limits  URINE CULTURE    EKG   Radiology No results found.  Procedures Procedures (including critical care  time)  Medications Ordered in UC Medications - No data to display  Initial Impression / Assessment and Plan / UC Course  I have reviewed the triage vital signs and the nursing notes.  Pertinent labs & imaging results that were available during my care of the patient were reviewed by me and considered in my medical decision making (see chart for details).   Patient is well-appearing, afebrile, not tachycardic, not tachypneic, oxygenating well on room air.  Patient is mildly hypertensive and triage today.  1. Recurrent UTI Urinalysis today is cloudy with trace intact blood, trace leukocyte Estrace Urine cultures pending Initially, I wanted to treat patient with fosfomycin every 48 hours for 3 days given recurrent UTI not improved with Keflex , however insurance would not cover fosfomycin Instead, will treat with ciprofloxacin  200 mg twice daily for 3 days Strict ER and return precautions discussed with patient  The patient was given the opportunity to ask questions.  All questions answered to their satisfaction.  The patient is in agreement to this plan.    Final Clinical Impressions(s) / UC Diagnoses   Final diagnoses:  Recurrent UTI     Discharge Instructions      Take the fosfomycin as prescribed to treat UTI.  We will contact you early next week if the culture shows we need to change the antibiotic.  If your symptoms worsen in the meantime, please seek emergent care.    ED Prescriptions     Medication Sig Dispense Auth. Provider   fosfomycin (MONUROL ) 3 g PACK Take 3 g by mouth every other day for 3 doses. 9 g Chandra Harlene LABOR, NP      PDMP not reviewed this encounter.   Chandra Harlene LABOR, NP 06/13/23 1550

## 2023-06-13 NOTE — Discharge Instructions (Addendum)
 Take the fosfomycin as prescribed to treat UTI.  We will contact you early next week if the culture shows we need to change the antibiotic.  If your symptoms worsen in the meantime, please seek emergent care.

## 2023-06-13 NOTE — Telephone Encounter (Signed)
 Pharmacy called regarding prescription from today's visit-insurance will not cover fosfomycin.  Ciprofloxacin sent to pharmacy.

## 2023-06-14 LAB — URINE CULTURE: Culture: 20000 — AB

## 2023-07-01 DIAGNOSIS — E782 Mixed hyperlipidemia: Secondary | ICD-10-CM | POA: Diagnosis not present

## 2023-07-01 DIAGNOSIS — E039 Hypothyroidism, unspecified: Secondary | ICD-10-CM | POA: Diagnosis not present

## 2023-07-04 DIAGNOSIS — F329 Major depressive disorder, single episode, unspecified: Secondary | ICD-10-CM | POA: Diagnosis not present

## 2023-07-04 DIAGNOSIS — I10 Essential (primary) hypertension: Secondary | ICD-10-CM | POA: Diagnosis not present

## 2023-07-04 DIAGNOSIS — E039 Hypothyroidism, unspecified: Secondary | ICD-10-CM | POA: Diagnosis not present

## 2023-07-04 DIAGNOSIS — M1990 Unspecified osteoarthritis, unspecified site: Secondary | ICD-10-CM | POA: Diagnosis not present

## 2023-07-04 DIAGNOSIS — G8320 Monoplegia of upper limb affecting unspecified side: Secondary | ICD-10-CM | POA: Diagnosis not present

## 2023-07-04 DIAGNOSIS — R233 Spontaneous ecchymoses: Secondary | ICD-10-CM | POA: Diagnosis not present

## 2023-07-04 DIAGNOSIS — E782 Mixed hyperlipidemia: Secondary | ICD-10-CM | POA: Diagnosis not present

## 2023-07-04 DIAGNOSIS — K219 Gastro-esophageal reflux disease without esophagitis: Secondary | ICD-10-CM | POA: Diagnosis not present

## 2023-07-05 ENCOUNTER — Telehealth: Payer: Self-pay | Admitting: Cardiology

## 2023-07-05 NOTE — Telephone Encounter (Signed)
Spoke with patient - states that she always has sob but has noticed more increased sob w/ activity since November.  Did go to ED to be checked out - had large hematoma on her forehead.  No c/o chest pain.  Does c/o nausea, weakness & swelling in abdominal area.  She does state that she has had reflux issues in the past as well.  No weight gain - staying 226 - 228lb.  States that her CPAP has been increased recently.  Did see her pcp for the rash today & they may be referring her to allergist for this.  Does have OV scheduled already for 07/18/2023 with Dr. Wyline Mood.  Advised her to continue to log her BP & HR readings in the meantime.  Message sent to provider for any further recommendations.

## 2023-07-05 NOTE — Telephone Encounter (Signed)
STAT if HR is under 50 or over 120 (normal HR is 60-100 beats per minute)  What is your heart rate?    Do you have a log of your heart rate readings (document readings)?  1/30: 156/90 96 1/31: 158/70 90  Do you have any other symptoms?   Patient states for the past 8-10 months she has had a rash that spreads all over her legs, palms of hands, back, and bottom of feet. She mentions tiny bumps as well. She states she scratches at night. She states the rash is coming and going but when it comes back it seems to spread. She states she hasn't had any medication changes. She states she saw a dermatologist who changed her soap and perfume, and gave her a lotion to use, but still no relief.

## 2023-07-09 NOTE — Telephone Encounter (Signed)
 Noted

## 2023-07-18 ENCOUNTER — Encounter: Payer: Self-pay | Admitting: Family Medicine

## 2023-07-18 ENCOUNTER — Encounter: Payer: Self-pay | Admitting: Cardiology

## 2023-07-18 ENCOUNTER — Ambulatory Visit: Payer: Medicare PPO | Admitting: Cardiology

## 2023-07-18 NOTE — Progress Notes (Deleted)
 Clinical Summary Ms. Shetley is a 78 y.o.female  seen today for follow up of the following mediacl problems.      1.SOB/hoarseness - recent SOB x 6 months, progressing - SOB with talking, exertion. DOE just walking room to room at home - coarse feeling in her throat/hoarseness. No specific wheezing. Some cough at times - throat feeling and breathing improved with breztri.  - occaiosnal LE edema. Some chest tighness at times, gets better with rest and improves with inhaler - no prior smoking history. Some 2nd and smoke exposure.  - hoarseness about 2 months intermittently. Ca have feeling of pills getting stuck, some food getting stuck.    - 11/2021 PFTs: minimal obstruction, +airtrapping.  - seen by ENT, hoarseness thought secondary to reflux. No worrisome findings on vocal cords. Improved with antiinflammatories - some DOE with activities that is chronic   2. LE edema   - no recent edema.      3. HTN - off HCTZ due to low Na during Jan 2021 admission for TIA. Norvasc 10mg  was also stopped at that time to allower permissive HTN, eventually restarted at lower dose of 5mg  daily     Home bp's 110s-130s/50s-80s    4. TIA - admission Jan 2021 -on ASA, statin   5. Hyperlipidemia - 09/2019 TC 139 TG 54 HDL 72 LDL 55 - 03/2020 TC 136 TG 49 HDL 74 LDL 51 - compliant with statin   12/2020 TC 149 TG 53 HDL 92 LDL 46  - has been taking atorvasting every other day due to muscle cramps   -did not tolerate ever every other day atorvastatin - tolerating pravastatin 40mg  daily.  - 10/2022 TC 204 TG 90 HDL 95 LDL 93       6. OSA - she is on cpap - last sleep study in 2021   7. Palpitations Jan 2022 monitor frequent PACS, rare PVCs - denies any recent issues, better of caffeine.  Past Medical History:  Diagnosis Date   Anemia    Anxiety    Arthritis    Complication of anesthesia    "pt. holds her breath when waking up"   Depression    GERD (gastroesophageal reflux  disease)    Hypercholesteremia    Hypertension    Hypothyroidism    Osteoporosis    PONV (postoperative nausea and vomiting)    Staph infection    abdomen and returned 5 yrs. later   TIA (transient ischemic attack)      Allergies  Allergen Reactions   Carvedilol Other (See Comments)    Weaknesses/shortness of breath/dyspepsia/cramps   Sulfamethoxazole Swelling    Lips swell and feel numb     Current Outpatient Medications  Medication Sig Dispense Refill   ALPRAZolam (XANAX) 0.25 MG tablet Take 0.25 mg by mouth daily as needed for anxiety.     amLODipine (NORVASC) 5 MG tablet Take 1 tablet (5 mg total) by mouth daily. 90 tablet 3   aspirin EC 81 MG tablet Take 81 mg by mouth daily. Swallow whole.     benzonatate (TESSALON) 100 MG capsule Take 1 capsule (100 mg total) by mouth every 8 (eight) hours. 21 capsule 0   Biotin w/ Vitamins C & E (HAIR/SKIN/NAILS PO) Take 3 tablets by mouth daily. Gummy     carboxymethylcellulose (REFRESH PLUS) 0.5 % SOLN 1-2 drops 3 (three) times daily as needed (dry/irritated eyes).     diclofenac (VOLTAREN) 75 MG EC tablet Take 75 mg by  mouth in the morning and at bedtime.     docusate sodium (COLACE) 100 MG capsule Take 300 mg by mouth in the morning.     donepezil (ARICEPT) 5 MG tablet Take 5 mg by mouth at bedtime.     DULoxetine (CYMBALTA) 60 MG capsule Take 60 mg by mouth every morning.     famotidine (PEPCID) 20 MG tablet One after supper 30 tablet 11   ferrous sulfate 325 (65 FE) MG tablet Take 1 tablet (325 mg total) by mouth daily. 30 tablet 3   GEMTESA 75 MG TABS Take 1 tablet by mouth daily.     hydrALAZINE (APRESOLINE) 50 MG tablet Take 1 tablet (50 mg total) by mouth 2 (two) times daily. 180 tablet 1   HYDROcodone-acetaminophen (NORCO/VICODIN) 5-325 MG tablet Take 1 tablet by mouth 2 (two) times daily as needed for moderate pain. 10 tablet 0   levothyroxine (SYNTHROID) 50 MCG tablet Take 50 mcg by mouth daily before breakfast.       losartan (COZAAR) 50 MG tablet Take 50 mg by mouth every evening.     Magnesium 250 MG TABS Take 250 mg by mouth daily.     nebivolol (BYSTOLIC) 10 MG tablet Take 1 tablet (10 mg total) by mouth at bedtime. 30 tablet 3   omeprazole (PRILOSEC) 20 MG capsule Take 20 mg by mouth every morning.     pravastatin (PRAVACHOL) 80 MG tablet Take 1 tablet (80 mg total) by mouth daily. 90 tablet 1   predniSONE (DELTASONE) 10 MG tablet Take 6 tabs daily x 2 days, 5 tabs daily x 2 days, 4 tabs daily x 2 days, etc 42 tablet 0   traMADol (ULTRAM) 50 MG tablet Take 50 mg by mouth 2 (two) times daily as needed (pain.).     triamcinolone cream (KENALOG) 0.1 % Apply 1 Application topically 2 (two) times daily. Avoid use on face or private areas 80 g 0   No current facility-administered medications for this visit.     Past Surgical History:  Procedure Laterality Date   ABDOMINAL HYSTERECTOMY     BIOPSY  10/02/2021   Procedure: BIOPSY;  Surgeon: Lanelle Bal, DO;  [facility]: [facility];  Service: Endoscopy;;   CATARACT EXTRACTION W/PHACO Right 01/22/2019   Procedure: CATARACT EXTRACTION PHACO AND INTRAOCULAR LENS PLACEMENT (IOC);  Surgeon: Fabio Pierce, MD;  Location: [facility];  Service: Ophthalmology;  Laterality: Right;  right, CDE: 19.63   CATARACT EXTRACTION W/PHACO Left 02/16/2019   Procedure: CATARACT EXTRACTION PHACO AND INTRAOCULAR LENS PLACEMENT LEFT EYE (CDE: 14.36);  Surgeon: Fabio Pierce, MD;  Location: [facility];  Service: Ophthalmology;  Laterality: Left;   CERVICAL FUSION  1990's   COLON SURGERY     COLONOSCOPY WITH PROPOFOL N/A 10/02/2021   Procedure: COLONOSCOPY WITH PROPOFOL;  Surgeon: Lanelle Bal, DO;  Location: [facility];  Service: Endoscopy;  Laterality: N/A;  11:45am   ESOPHAGOGASTRODUODENOSCOPY (EGD) WITH PROPOFOL N/A 10/02/2021   Procedure: ESOPHAGOGASTRODUODENOSCOPY (EGD) WITH PROPOFOL;  Surgeon: Lanelle Bal, DO;  [facility]: [facility];  Service: Endoscopy;  Laterality:  N/A;   FOOT SURGERY Left    reshaped   HERNIA REPAIR     HIP ARTHROPLASTY Left    JOINT REPLACEMENT Right    hip   LUMBAR SPINE SURGERY     REPLACEMENT TOTAL KNEE Right    THYROIDECTOMY, PARTIAL     TOTAL KNEE ARTHROPLASTY Left 08/21/2016   Procedure: LEFT TOTAL KNEE ARTHROPLASTY;  Surgeon: Jewel Baize  Eulah Pont, MD;  Location: [facility];  Service: Orthopedics;  Laterality: Left;     Allergies  Allergen Reactions   Carvedilol Other (See Comments)    Weaknesses/shortness of breath/dyspepsia/cramps   Sulfamethoxazole Swelling    Lips swell and feel numb      Family History  Problem Relation Age of Onset   Colon cancer Neg Hx    Colon polyps Neg Hx      Social History Ms. Larke reports that she has never smoked. She has never used smokeless tobacco. Ms. Vecchiarelli reports no history of alcohol use.   Review of Systems CONSTITUTIONAL: No weight loss, fever, chills, weakness or fatigue.  HEENT: Eyes: No visual loss, blurred vision, double vision or yellow sclerae.No hearing loss, sneezing, congestion, runny nose or sore throat.  SKIN: No rash or itching.  CARDIOVASCULAR:  RESPIRATORY: No shortness of breath, cough or sputum.  GASTROINTESTINAL: No anorexia, nausea, vomiting or diarrhea. No abdominal pain or blood.  GENITOURINARY: No burning on urination, no polyuria NEUROLOGICAL: No headache, dizziness, syncope, paralysis, ataxia, numbness or tingling in the extremities. No change in bowel or bladder control.  MUSCULOSKELETAL: No muscle, back pain, joint pain or stiffness.  LYMPHATICS: No enlarged nodes. No history of splenectomy.  PSYCHIATRIC: No history of depression or anxiety.  ENDOCRINOLOGIC: No reports of sweating, cold or heat intolerance. No polyuria or polydipsia.  Marland Kitchen   Physical Examination There were no vitals filed for this visit. There were no vitals filed for this visit.  Gen: resting comfortably, no acute distress HEENT: no scleral icterus, pupils equal round and  reactive, no palptable cervical adenopathy,  CV Resp: Clear to auscultation bilaterally GI: abdomen is soft, non-tender, non-distended, normal bowel sounds, no hepatosplenomegaly MSK: extremities are warm, no edema.  Skin: warm, no rash Neuro:  no focal deficits Psych: appropriate affect   Diagnostic Studies  03/2017 echo Study Conclusions   - Left ventricle: The cavity size was normal. Systolic function was   normal. The estimated ejection fraction was in the range of 55%   to 60%. Wall motion was normal; there were no regional wall   motion abnormalities. Doppler parameters are consistent with   abnormal left ventricular relaxation (grade 1 diastolic   dysfunction). - Mitral valve: There was moderate regurgitation. - Atrial septum: No defect or patent foramen ovale was identified. - Tricuspid valve: There was mild regurgitation. - Pulmonic valve: There was mild regurgitation. - Pulmonary arteries: PA peak pressure: 41 mm Hg (S). - Systemic veins: The IVC was suboptimally visualized, but appeared   dilated. There was normal respiratory variation. Estimated RAP 8   mmHg.     Jan 2022 7 day zio patch 7 day monitor Min HR 55, Max HR 119, Avg HR 69 Frequent supraventricular ectopy in the form of isolated PACs, couplets, triplets Rare ventricular ectopy in the form of isolated PVCs. Reported symptoms correlated with sinus rhythm with PACs       Assessment and Plan   1.SOB/hoarseness - followed by ENT, improved with antacids. Being managed for reflux as the etiology     2. Hyperlipidemia - did not tolerate atorvastat - tolerating pravastatin. LDL not at goal, increase pravastatin to 80mg  daily.      3.Palpitations - no recent symptoms, continue current meds   4. HTN - at goal based on home numbers, continue current meds.      Antoine Poche, M.D., F.A.C.C.

## 2023-07-26 ENCOUNTER — Encounter: Payer: Self-pay | Admitting: Nurse Practitioner

## 2023-07-26 ENCOUNTER — Ambulatory Visit: Payer: Medicare PPO | Attending: Nurse Practitioner | Admitting: Nurse Practitioner

## 2023-07-26 VITALS — BP 128/70 | HR 75 | Ht 59.0 in | Wt 230.6 lb

## 2023-07-26 DIAGNOSIS — G4733 Obstructive sleep apnea (adult) (pediatric): Secondary | ICD-10-CM

## 2023-07-26 DIAGNOSIS — E875 Hyperkalemia: Secondary | ICD-10-CM

## 2023-07-26 DIAGNOSIS — E876 Hypokalemia: Secondary | ICD-10-CM | POA: Diagnosis not present

## 2023-07-26 DIAGNOSIS — I10 Essential (primary) hypertension: Secondary | ICD-10-CM | POA: Diagnosis not present

## 2023-07-26 DIAGNOSIS — R0609 Other forms of dyspnea: Secondary | ICD-10-CM

## 2023-07-26 DIAGNOSIS — E785 Hyperlipidemia, unspecified: Secondary | ICD-10-CM | POA: Diagnosis not present

## 2023-07-26 NOTE — Progress Notes (Signed)
Cardiology Office Note:  .   Date:  07/26/2023  ID:  MAZZY SANTARELLI, DOB 02/09/1946, MRN [id number] PCP: Benita Stabile, MD  [facility] Providers Cardiologist:  Dina Rich, MD    History of Present Illness: .   [name] is a 78 y.o. female with a PMH of SHOB, HTN, leg edema, hx of TIA in 2021, HLD, OSA on CPAP, and palpitations, who presents today for 6 month follow-up.   Last seen by Dr. Dina Rich on January 08, 2023. Was overall doing well, pravastatin increased to improve LDL.   Today she presents for follow-up.  She admits to labile blood pressure readings and admits to dyspnea on exertion.  Says dyspnea on exertion is her chief concern and will have to sit and take breaks.  Says she goes about 15 feet and gets short of breath, then begins to start gagging.  This is new for her and has been going on since before Christmas of 2024.  Family member in room says she has been also dealing with recurrent UTIs. Denies any chest pain, palpitations, syncope, presyncope, dizziness, orthopnea, PND, swelling or significant weight changes, acute bleeding, or claudication.  She says she has been dealing with a rash along her forearms/hands and not sure what this is due to.  Says she will be seeing a dermatologist soon regarding this.  ROS: Negative. See HPI.   Studies Reviewed: Marland Kitchen    EKG: EKG is not ordered today.   Cardiac monitor 06/2020: 7 day monitor Min HR 55, Max HR 119, Avg HR 69 Frequent supraventricular ectopy in the form of isolated PACs, couplets, triplets Rare ventricular ectopy in the form of isolated PVCs. Reported symptoms correlated with sinus rhythm with PACs  Limited echocardiogram 06/2019: 1. Left ventricular ejection fraction, by visual estimation, is 60 to  65%. The left ventricle has normal function. There is mildly increased  left ventricular wall thickness.   2. Left ventricular diastolic parameters are consistent with Grade I  diastolic  dysfunction (impaired relaxation).   3. The left ventricle has no regional wall motion abnormalities.   4. Global right ventricle has normal systolc function.The right  ventricular size is normal. no increase in right ventricular wall  thickness.   5. Left atrial size was moderately dilated.   6. Right atrial size was mildly dilated.   7. The mitral valve is grossly normal. Mild mitral valve regurgitation.   8. The tricuspid valve was grossly normal. Tricuspid valve regurgitation  is mild.   9. Tricuspid valve regurgitation is mild.  10. No evidence of aortic valve sclerosis or stenosis.  11. Moderately elevated pulmonary artery systolic pressure.  12. The interatrial septum was not well visualized.  Risk Assessment/Calculations:           The 10-year ASCVD risk score (Arnett DK, et al., 2019) is: 36.5%   Values used to calculate the score:     Age: 28 years     Sex: Female     Is Non-Hispanic African American: No     Diabetic: Yes     Tobacco smoker: No     Systolic Blood Pressure: 128 mmHg     Is BP treated: Yes     HDL Cholesterol: 81 mg/dL     Total Cholesterol: 171 mg/dL  Physical Exam:   VS:  BP 128/70   Pulse 75   Ht 4\' 11"  (1.499 m)   Wt 230 lb 9.6 oz (104.6 kg)   SpO2  91%   BMI 46.58 kg/m    Wt Readings from Last 3 Encounters:  07/26/23 230 lb 9.6 oz (104.6 kg)  05/01/23 229 lb 11.5 oz (104.2 kg)  01/08/23 229 lb 12.8 oz (104.2 kg)    GEN: Morbidly obese, 78 year old female in no acute distress NECK: No JVD; No carotid bruits CARDIAC: S1/S2, RRR, no murmurs, rubs, gallops RESPIRATORY:  Clear to auscultation without rales, wheezing or rhonchi  ABDOMEN: Soft, non-tender, non-distended EXTREMITIES:  No edema; No deformity   ASSESSMENT AND PLAN: .   1.  DOE Has been new and ongoing since before Christmas 2024.  Will update echocardiogram at this time.  Weight is stable over the past several months. Euvolemic and well compensated on exam.  Continue current  medication regimen.  Continue follow-up with PCP.  Care and ED precautions discussed.  2.  Hypertension Blood pressure stable today.  Admits to some labile BP readings at home.  BP log reviewed that is on file and overall BP is at goal. Discussed to monitor BP at home at least 2 hours after medications and sitting for 5-10 minutes.  No medication changes at this time.  Heart healthy diet recommended.  3.  Hyperlipidemia LDL 87 in January 2025.  Continue pravastatin. Heart healthy diet and regular cardiovascular exercise encouraged.   4. OSA on CPAP  Encouraged continued compliance.  5.  Hyperkalemia Labs from January 2025 revealed potassium level at 5.7.  She is not on any potassium supplement or medication that should be causing this.  Will recheck STAT BMET today.  May possibly require Lokelma if still elevated.   Dispo: Follow-up with me or APP in 6 to 8 weeks or sooner anything changes.  Signed, [name], NP

## 2023-07-26 NOTE — Patient Instructions (Addendum)
Medication Instructions:  Your physician recommends that you continue on your current medications as directed. Please refer to the Current Medication list given to you today.  Labwork: STAT today   Testing/Procedures: Your physician has requested that you have an echocardiogram. Echocardiography is a painless test that uses sound waves to create images of your heart. It provides your doctor with information about the size and shape of your heart and how well your heart's chambers and valves are working. This procedure takes approximately one hour. There are no restrictions for this procedure. Please do NOT wear cologne, perfume, aftershave, or lotions (deodorant is allowed). Please arrive 15 minutes prior to your appointment time.  Please note: We ask at that you not bring children with you during ultrasound (echo/ vascular) testing. Due to room size and safety concerns, children are not allowed in the ultrasound rooms during exams. Our front office staff cannot provide observation of children in our lobby area while testing is being conducted. An adult accompanying a patient to their appointment will only be allowed in the ultrasound room at the discretion of the ultrasound technician under special circumstances. We apologize for any inconvenience.  Follow-Up: Your physician recommends that you schedule a follow-up appointment in: 6-8 weeks   Any Other Special Instructions Will Be Listed Below (If Applicable).  If you need a refill on your cardiac medications before your next appointment, please call your pharmacy.

## 2023-07-27 LAB — BASIC METABOLIC PANEL
BUN/Creatinine Ratio: 23 (ref 12–28)
BUN: 18 mg/dL (ref 8–27)
CO2: 16 mmol/L — ABNORMAL LOW (ref 20–29)
Calcium: 9.6 mg/dL (ref 8.7–10.3)
Chloride: 93 mmol/L — ABNORMAL LOW (ref 96–106)
Creatinine, Ser: 0.79 mg/dL (ref 0.57–1.00)
Glucose: 100 mg/dL — ABNORMAL HIGH (ref 70–99)
Potassium: 5.3 mmol/L — ABNORMAL HIGH (ref 3.5–5.2)
Sodium: 132 mmol/L — ABNORMAL LOW (ref 134–144)
eGFR: 78 mL/min/{1.73_m2} (ref >59)

## 2023-07-29 DIAGNOSIS — L308 Other specified dermatitis: Secondary | ICD-10-CM | POA: Diagnosis not present

## 2023-07-29 DIAGNOSIS — L905 Scar conditions and fibrosis of skin: Secondary | ICD-10-CM | POA: Diagnosis not present

## 2023-08-12 ENCOUNTER — Ambulatory Visit: Payer: Medicare PPO | Attending: Nurse Practitioner

## 2023-08-12 ENCOUNTER — Other Ambulatory Visit: Payer: Self-pay | Admitting: Nurse Practitioner

## 2023-08-12 DIAGNOSIS — R0603 Acute respiratory distress: Secondary | ICD-10-CM

## 2023-08-12 DIAGNOSIS — R0609 Other forms of dyspnea: Secondary | ICD-10-CM | POA: Diagnosis not present

## 2023-08-12 DIAGNOSIS — E876 Hypokalemia: Secondary | ICD-10-CM

## 2023-08-13 LAB — ECHOCARDIOGRAM COMPLETE
AR max vel: 3.71 cm2
AV Area VTI: 3.48 cm2
AV Area mean vel: 3.3 cm2
AV Mean grad: 6 mmHg
AV Peak grad: 11.3 mmHg
Ao pk vel: 1.68 m/s
Area-P 1/2: 5.09 cm2
Calc EF: 65.8 %
MV VTI: 5.31 cm2
S' Lateral: 2.8 cm
Single Plane A2C EF: 64 %
Single Plane A4C EF: 67.7 %

## 2023-09-06 ENCOUNTER — Encounter: Payer: Self-pay | Admitting: Nurse Practitioner

## 2023-09-06 ENCOUNTER — Telehealth: Payer: Self-pay | Admitting: Cardiology

## 2023-09-06 ENCOUNTER — Ambulatory Visit: Payer: Medicare PPO | Attending: Nurse Practitioner | Admitting: Nurse Practitioner

## 2023-09-06 VITALS — BP 130/72 | Wt 236.0 lb

## 2023-09-06 DIAGNOSIS — I272 Pulmonary hypertension, unspecified: Secondary | ICD-10-CM

## 2023-09-06 DIAGNOSIS — R0602 Shortness of breath: Secondary | ICD-10-CM

## 2023-09-06 DIAGNOSIS — E785 Hyperlipidemia, unspecified: Secondary | ICD-10-CM | POA: Diagnosis not present

## 2023-09-06 DIAGNOSIS — I10 Essential (primary) hypertension: Secondary | ICD-10-CM | POA: Diagnosis not present

## 2023-09-06 DIAGNOSIS — R0609 Other forms of dyspnea: Secondary | ICD-10-CM | POA: Diagnosis not present

## 2023-09-06 DIAGNOSIS — R112 Nausea with vomiting, unspecified: Secondary | ICD-10-CM

## 2023-09-06 DIAGNOSIS — G4733 Obstructive sleep apnea (adult) (pediatric): Secondary | ICD-10-CM

## 2023-09-06 DIAGNOSIS — E875 Hyperkalemia: Secondary | ICD-10-CM | POA: Diagnosis not present

## 2023-09-06 NOTE — Telephone Encounter (Signed)
 Patient calling to see if her appt can be switch to mychart visit. She states she is sick. Please advise

## 2023-09-06 NOTE — Telephone Encounter (Signed)
 Message sent to provider to confirm status switch for patient .

## 2023-09-06 NOTE — Telephone Encounter (Signed)
 Provider agreeable to see pt virtually (phone call)     Patient Consent for Virtual Visit  [id number]}   [name] has provided verbal consent on 09/06/2023 for a virtual visit (video or telephone).   CONSENT FOR VIRTUAL VISIT FOR:  [name]  By participating in this virtual visit I agree to the following:  I hereby voluntarily request, consent and authorize [facility] and its employed or contracted physicians, physician assistants, nurse practitioners or other licensed health care professionals (the Practitioner), to provide me with telemedicine health care services (the "Services") as deemed necessary by the treating Practitioner. I acknowledge and consent to receive the Services by the Practitioner via telemedicine. I understand that the telemedicine visit will involve communicating with the Practitioner through live audiovisual communication technology and the disclosure of certain medical information by electronic transmission. I acknowledge that I have been given the opportunity to request an in-person assessment or other available alternative prior to the telemedicine visit and am voluntarily participating in the telemedicine visit.  I understand that I have the right to withhold or withdraw my consent to the use of telemedicine in the course of my care at any time, without affecting my right to future care or treatment, and that the Practitioner or I may terminate the telemedicine visit at any time. I understand that I have the right to inspect all information obtained and/or recorded in the course of the telemedicine visit and may receive copies of available information for a reasonable fee.  I understand that some of the potential risks of receiving the Services via telemedicine include:  Delay or interruption in medical evaluation due to technological equipment failure or disruption; Information transmitted may not be sufficient (e.g. poor resolution of images) to  allow for appropriate medical decision making by the Practitioner; and/or  In rare instances, security protocols could fail, causing a breach of personal health information.  Furthermore, I acknowledge that it is my responsibility to provide information about my medical history, conditions and care that is complete and accurate to the best of my ability. I acknowledge that Practitioner's advice, recommendations, and/or decision may be based on factors not within their control, such as incomplete or inaccurate data provided by me or distortions of diagnostic images or specimens that may result from electronic transmissions. I understand that the practice of medicine is not an exact science and that Practitioner makes no warranties or guarantees regarding treatment outcomes. I acknowledge that a copy of this consent can be made available to me via my patient portal [facility] MyChart), or I can request a printed copy by calling the office of  [facility].    I understand that my insurance will be billed for this visit.   I have read or had this consent read to me. I understand the contents of this consent, which adequately explains the benefits and risks of the Services being provided via telemedicine.  I have been provided ample opportunity to ask questions regarding this consent and the Services and have had my questions answered to my satisfaction. I give my informed consent for the services to be provided through the use of telemedicine in my medical care

## 2023-09-06 NOTE — Progress Notes (Signed)
 Virtual Visit via Telephone Note   Because of [name] co-morbid illnesses, she is at least at moderate risk for complications without adequate follow up.  This format is felt to be most appropriate for this patient at this time.  The patient did not have access to video technology/had technical difficulties with video requiring transitioning to audio format only (telephone).  All issues noted in this document were discussed and addressed.  No physical exam could be performed with this format.  Please refer to the patient's chart for her consent to telehealth for [facility].    Date: 09/06/2023  ID:  [name], DOB 1946-03-03, MRN [id number] The patient was identified using 2 identifiers.  Patient Location: Home Provider Location: Office/Clinic   PCP:  Benita Stabile, MD   [facility] Providers Cardiologist:  Dina Rich, MD     Evaluation Performed:  Follow-Up Visit  Chief Complaint:  Dyspnea on exertion follow-up  History of Present Illness:    [name] is a 78 y.o. female with a PMH of SHOB, HTN, leg edema, hx of TIA in 2021, HLD, OSA on CPAP, and palpitations, who presents today for telephone follow-up.   Last seen by Dr. Dina Rich on January 08, 2023. Was overall doing well, pravastatin increased to improve LDL.   07/26/2023 - Today she presents for follow-up.  She admits to labile blood pressure readings and admits to dyspnea on exertion.  Says dyspnea on exertion is her chief concern and will have to sit and take breaks.  Says she goes about 15 feet and gets short of breath, then begins to start gagging.  This is new for her and has been going on since before Christmas of 2024.  Family member in room says she has been also dealing with recurrent UTIs. Denies any chest pain, palpitations, syncope, presyncope, dizziness, orthopnea, PND, swelling or significant weight changes, acute bleeding, or claudication.  She says she has been  dealing with a rash along her forearms/hands and not sure what this is due to.  Says she will be seeing a dermatologist soon regarding this.   09/06/2023 -  She presents today via telephone visit as she is not feeling well and says she is feeling sick today.  She says as long as she "sits still," she does okay.  Says that she eats anything, it will cause her to go straight to the bathroom.  She walks more than 20 feet, she begins gagging/vomiting.  Says the symptoms have been persistent for quite a while and usually occurs after getting up, says symptoms are now more persistent than they used to be.  She says previously when she had lab work performed that showed elevated potassium levels, she took some liquid IV and wonders if this is due to this.  Breathing is stable from last office visit. Denies any chest pain, palpitations, syncope, presyncope, dizziness, orthopnea, PND, swelling or significant weight changes, acute bleeding, or claudication.    ROS:   Please see the history of present illness.    All other systems reviewed and are negative.   Prior CV studies:   The following studies were reviewed today:  Echo 08/2023:  1. Left ventricular ejection fraction, by estimation, is 60 to 65%. Left  ventricular ejection fraction by 3D volume is 60 %. The left ventricle has  normal function. The left ventricle has no regional wall motion  abnormalities. Left ventricular diastolic   parameters were normal.  2. Right ventricular systolic function is normal. The right ventricular  size is normal. There is moderately elevated pulmonary artery systolic  pressure. The estimated right ventricular systolic pressure is 52.0 mmHg.   3. The mitral valve is normal in structure. Trivial mitral valve  regurgitation. No evidence of mitral stenosis.   4. The aortic valve is tricuspid. There is mild calcification of the  aortic valve. Aortic valve regurgitation is not visualized. No aortic  stenosis is present.  Aortic valve mean gradient measures 6.0 mmHg.   5. The inferior vena cava is normal in size with greater than 50%  respiratory variability, suggesting right atrial pressure of 3 mmHg.   6. Increased flow velocities may be secondary to anemia, thyrotoxicosis,  hyperdynamic or high flow state.   Comparison(s): Changes from prior study are noted. Moderate pulmonary HTN  present and stable LVEF.   Cardiac monitor 06/2020: 7 day monitor Min HR 55, Max HR 119, Avg HR 69 Frequent supraventricular ectopy in the form of isolated PACs, couplets, triplets Rare ventricular ectopy in the form of isolated PVCs. Reported symptoms correlated with sinus rhythm with PACs  Limited echocardiogram 06/2019: 1. Left ventricular ejection fraction, by visual estimation, is 60 to  65%. The left ventricle has normal function. There is mildly increased  left ventricular wall thickness.   2. Left ventricular diastolic parameters are consistent with Grade I  diastolic dysfunction (impaired relaxation).   3. The left ventricle has no regional wall motion abnormalities.   4. Global right ventricle has normal systolc function.The right  ventricular size is normal. no increase in right ventricular wall  thickness.   5. Left atrial size was moderately dilated.   6. Right atrial size was mildly dilated.   7. The mitral valve is grossly normal. Mild mitral valve regurgitation.   8. The tricuspid valve was grossly normal. Tricuspid valve regurgitation  is mild.   9. Tricuspid valve regurgitation is mild.  10. No evidence of aortic valve sclerosis or stenosis.  11. Moderately elevated pulmonary artery systolic pressure.  12. The interatrial septum was not well visualized.  Labs/Other Tests and Data Reviewed:    EKG:  No ECG reviewed.  Recent Labs: 07/26/2023: BUN 18; Creatinine, Ser 0.79; Potassium 5.3; Sodium 132    Objective:    Vital Signs:  BP 130/72 Comment: unable to check this is most recent  Wt 236 lb  (107 kg)   BMI 47.67 kg/m    Due to nature of today's visit, physical exam was unable to be performed.   ASSESSMENT & PLAN:    1.  DOE, pulmonary HTN Etiology unclear. DOE has been ongoing since before Christmas 2024, along with other symptoms including gagging/vomiting, sounds GI related - see below.  Recent echo revealed normal EF, stable moderately elevated PASP since 2021. Group 3 PAH. Will obtain labs for further evaluation of pulmonary HTN. Weight is stable over the past several months.  Continue current medication regimen.  Continue follow-up with PCP.  Care and ED precautions discussed.  Will obtain proBNP, CBC, CMET and Mag level.  2.  Hypertension Blood pressure stable.  Discussed to monitor BP at home at least 2 hours after medications and sitting for 5-10 minutes.  No medication changes at this time.  Heart healthy diet recommended.  Obtaining labs as mentioned above. Will bring her back in 4-6 weeks to recheck her BP during nurse visit and she will bring her log in for further review at that time.   3.  Hyperlipidemia LDL 87 in January 2025.  Continue pravastatin. Heart healthy diet and regular cardiovascular exercise encouraged.   4. OSA on CPAP  Encouraged continued compliance.  5.  Hyperkalemia Most recent labs on 07/26/2023 showed improved K+ to 5.3. She confirms that she is not on any potassium supplement or medication that should be causing this.  Will recheck CMET as mentioned above.   6. Nausea/vomiting Has been ongoing and more persistent recently.  Etiology most likely GI related.  Will refer her to GI for further evaluation. Care and ED precautions discussed.  Will obtain labs to evaluate for electrolyte abnormalities as mentioned above.  Care and ED precautions discussed.   Time:   Today, I have spent 20 minutes with the patient with telehealth technology discussing the above problems.     Medication Adjustments/Labs and Tests Ordered: Current medicines are  reviewed at length with the patient today.  Concerns regarding medicines are outlined above.   Tests Ordered: Orders Placed This Encounter  Procedures   CBC   Comprehensive Metabolic Panel (CMET)   Magnesium   Brain natriuretic peptide   Ambulatory referral to Gastroenterology    Medication Changes: No orders of the defined types were placed in this encounter.   Follow Up:  In Person in 3 month(s)  Signed, [name], NP

## 2023-09-06 NOTE — Patient Instructions (Signed)
 Medication Instructions:  Your physician recommends that you continue on your current medications as directed. Please refer to the Current Medication list given to you today.  Labwork: In 1-2 weeks at [facility] (Orders have been sent to their office)   Testing/Procedures: None   Follow-Up: Your physician recommends that you schedule a follow-up appointment in:  4-6 weeks for a Nurse visit for a Blood Pressure Check 3 Months In office with provider   Any Other Special Instructions Will Be Listed Below (If Applicable).  If you need a refill on your cardiac medications before your next appointment, please call your pharmacy.

## 2023-09-06 NOTE — Telephone Encounter (Signed)
 Patient notified and verbalized understanding that visit will be telephone.

## 2023-09-11 ENCOUNTER — Ambulatory Visit (INDEPENDENT_AMBULATORY_CARE_PROVIDER_SITE_OTHER): Admitting: Allergy & Immunology

## 2023-09-11 ENCOUNTER — Encounter: Payer: Self-pay | Admitting: Allergy & Immunology

## 2023-09-11 VITALS — BP 116/70 | HR 101 | Temp 97.9°F | Resp 18 | Ht <= 58 in | Wt 232.5 lb

## 2023-09-11 DIAGNOSIS — G43109 Migraine with aura, not intractable, without status migrainosus: Secondary | ICD-10-CM

## 2023-09-11 DIAGNOSIS — M1712 Unilateral primary osteoarthritis, left knee: Secondary | ICD-10-CM

## 2023-09-11 DIAGNOSIS — I10 Essential (primary) hypertension: Secondary | ICD-10-CM | POA: Diagnosis not present

## 2023-09-11 DIAGNOSIS — L508 Other urticaria: Secondary | ICD-10-CM

## 2023-09-11 MED ORDER — TACROLIMUS 0.03 % EX OINT: TOPICAL_OINTMENT | Freq: Two times a day (BID) | CUTANEOUS | 3 refills | Status: AC

## 2023-09-11 MED ORDER — CETIRIZINE HCL 10 MG PO TABS: 10.0000 mg | ORAL_TABLET | Freq: Two times a day (BID) | ORAL | 1 refills | Status: DC

## 2023-09-11 MED ORDER — FAMOTIDINE 20 MG PO TABS: 20.0000 mg | ORAL_TABLET | Freq: Two times a day (BID) | ORAL | 1 refills | Status: DC

## 2023-09-11 NOTE — Patient Instructions (Addendum)
 1. Chronic urticaria - Your history does not have any "red flags" such as fevers, joint pains, or permanent skin changes that would be concerning for a more serious cause of hives.  - We will get some labs to rule out serious causes of hives: alpha gal panel, complete blood count, tryptase level, chronic urticaria panel, CMP, ESR, and CRP. - We are going to get some labs to look for environmental allergies.  - We are going to get the most common foods. - Chronic hives are often times a self limited process and will "burn themselves out" over 6-12 months, although this is not always the case.  - In the meantime, start suppressive dosing of antihistamines:   - Morning: Zyrtec (cetirizine) 10mg  + Pepcid (famotidine) 20mg   - Evening: Zyrtec (cetirizine) 10mg  + Pepcid (famotidine) 20mg  - You can change this dosing at home, decreasing the dose as needed or increasing the dosing as needed.  - If you are not tolerating the medications or are tired of taking them every day, we can start treatment with a monthly injectable medication called Xolair.    2. Return in about 3 months (around 12/11/2023). You can have the follow up appointment with Dr. Dellis Anes or a Nurse Practicioner (our Nurse Practitioners are excellent and always have Physician oversight!).    Please inform us of any Emergency Department visits, hospitalizations, or changes in symptoms. Call us before going to the ED for breathing or allergy symptoms since we might be able to fit you in for a sick visit. Feel free to contact us anytime with any questions, problems, or concerns.  It was a pleasure to meet you and your family today!  Websites that have reliable patient information: 1. American Academy of Asthma, Allergy, and Immunology: www.aaaai.org 2. Food Allergy Research and Education (FARE): foodallergy.org 3. Mothers of Asthmatics: http://www.asthmacommunitynetwork.org 4. American College of Allergy, Asthma, and Immunology:  www.acaai.org      "Like" [place] on Facebook and Instagram for our latest updates!      A healthy democracy works best when [employer] participate! Make sure you are registered to vote! If you have moved or changed any of your contact information, you will need to get this updated before voting! Scan the QR codes below to learn more!

## 2023-09-11 NOTE — Progress Notes (Signed)
 NEW PATIENT  Date of Service/Encounter:  09/11/23  Consult requested by: Benita Stabile, MD   Assessment:   Chronic urticaria  Failed hydrocortisone and triamcinolone   Complicated past medical history, including hypertension, osteoarthritis, migraines, and GERD  Plan/Recommendations:   1. Chronic urticaria - Your history does not have any "red flags" such as fevers, joint pains, or permanent skin changes that would be concerning for a more serious cause of hives.  - We will get some labs to rule out serious causes of hives: alpha gal panel, complete blood count, tryptase level, chronic urticaria panel, CMP, ESR, and CRP. - We are going to get some labs to look for environmental allergies.  - We are going to get the most common foods. - Chronic hives are often times a self limited process and will "burn themselves out" over 6-12 months, although this is not always the case.  - In the meantime, start suppressive dosing of antihistamines:   - Morning: Zyrtec (cetirizine) 10mg  + Pepcid (famotidine) 20mg   - Evening: Zyrtec (cetirizine) 10mg  + Pepcid (famotidine) 20mg  - You can change this dosing at home, decreasing the dose as needed or increasing the dosing as needed.  - If you are not tolerating the medications or are tired of taking them every day, we can start treatment with a monthly injectable medication called Xolair.    2. Return in about 3 months (around 12/11/2023). You can have the follow up appointment with Dr. Dellis Anes or a Nurse Practicioner (our Nurse Practitioners are excellent and always have Physician oversight!).    This note in its entirety was forwarded to the Provider who requested this consultation.  Subjective:   [name] is a 78 y.o. female presenting today for evaluation of  Chief Complaint  Patient presents with   Urticaria    Breakouts that are itchy     [name] has a history of the following: Patient Active Problem List    Diagnosis Date Noted   Upper airway cough syndrome 02/12/2022   DOE (dyspnea on exertion) 02/12/2022   Morbid obesity due to excess calories (HCC) 02/12/2022   Positive colorectal cancer screening using Cologuard test 09/15/2021   Anemia 09/15/2021   COVID-19 virus infection 06/23/2019   Transient speech disturbance 06/22/2019   Hypothyroidism    Migraine equivalent 04/16/2017   Anxiety 04/16/2017   Palpitation 04/16/2017   Hyperlipidemia 04/16/2017   Essential hypertension 03/06/2017   Hypokalemia 03/06/2017   Hyponatremia 03/06/2017   TIA (transient ischemic attack) 03/05/2017   Primary localized osteoarthritis of left knee 08/22/2016   Paresthesia 08/10/2016   Weakness 08/10/2016   Depression 07/31/2016   S/P thyroidectomy 07/31/2016   Primary osteoarthritis of left knee 07/31/2016   S/P cervical spinal fusion 07/31/2016   S/P total knee arthroplasty, right 07/31/2016   SUBLUXATION, RADIAL HEAD 10/27/2009   CONTUSION, ELBOW 10/27/2009    History obtained from: chart review and patient.  Discussed the use of AI scribe software for clinical note transcription with the patient and/or guardian, who gave verbal consent to proceed.  [name] was referred by Benita Stabile, MD.     [name] is a 78 y.o. female presenting for an evaluation of itching .  She has been experiencing itchy skin bumps since February 2025. Initially, the bumps were very itchy and have started to dry up, leaving small craters. The bumps are located in her hair, on her back, feet, and legs, and occasionally on her neck. She describes  having 'itching frenzies' and notes that the bumps sometimes fill with fluid, resembling blisters. A biopsy was performed, but the results were inconclusive. She was seen by Dr. Margo Aye (Dermatology).   She has been using CeraVe cream and a prescription ointment, triamcinolone, which she finds greasy and not very effective, although it helps with itching. She has also tried  over-the-counter hydrocortisone, Benadryl, and clotrimazole without significant relief. Allegra has been helpful for hives and itching. She has tried a high-strength allergy medication that caused drowsiness, leading her to stop driving. We thinks that this was hydroxyzine.   In February 2025, her liver enzymes were found to be slightly elevated. She has not been tested for underlying hepatitis. She is unsure if her symptoms are related to food.  She experiences bruising easily, which she attributes to taking aspirin. No asthma. Occasional sinus infections and more frequent UTIs.     Otherwise, there is no history of other atopic diseases, including asthma, food allergies, drug allergies, environmental allergies, stinging insect allergies, or contact dermatitis. There is no significant infectious history. Vaccinations are up to date.    Past Medical History: Patient Active Problem List   Diagnosis Date Noted   Upper airway cough syndrome 02/12/2022   DOE (dyspnea on exertion) 02/12/2022   Morbid obesity due to excess calories (HCC) 02/12/2022   Positive colorectal cancer screening using Cologuard test 09/15/2021   Anemia 09/15/2021   COVID-19 virus infection 06/23/2019   Transient speech disturbance 06/22/2019   Hypothyroidism    Migraine equivalent 04/16/2017   Anxiety 04/16/2017   Palpitation 04/16/2017   Hyperlipidemia 04/16/2017   Essential hypertension 03/06/2017   Hypokalemia 03/06/2017   Hyponatremia 03/06/2017   TIA (transient ischemic attack) 03/05/2017   Primary localized osteoarthritis of left knee 08/22/2016   Paresthesia 08/10/2016   Weakness 08/10/2016   Depression 07/31/2016   S/P thyroidectomy 07/31/2016   Primary osteoarthritis of left knee 07/31/2016   S/P cervical spinal fusion 07/31/2016   S/P total knee arthroplasty, right 07/31/2016   SUBLUXATION, RADIAL HEAD 10/27/2009   CONTUSION, ELBOW 10/27/2009    Medication List:  Allergies as of 09/11/2023        Reactions   Carvedilol Other (See Comments)   Weaknesses/shortness of breath/dyspepsia/cramps   Sulfamethoxazole Swelling   Lips swell and feel numb        Medication List        Accurate as of September 11, 2023  3:10 PM. If you have any questions, ask your nurse or doctor.          ALPRAZolam 0.25 MG tablet Commonly known as: XANAX Take 0.25 mg by mouth daily as needed for anxiety.   amLODipine 5 MG tablet Commonly known as: NORVASC Take 1 tablet (5 mg total) by mouth daily.   aspirin EC 81 MG tablet Take 81 mg by mouth daily. Swallow whole.   augmented betamethasone dipropionate 0.05 % cream Commonly known as: DIPROLENE-AF Apply topically 2 (two) times daily as needed.   carboxymethylcellulose 0.5 % Soln Commonly known as: REFRESH PLUS 1-2 drops 3 (three) times daily as needed (dry/irritated eyes).   cetirizine 10 MG tablet Commonly known as: ZYRTEC Take 1 tablet (10 mg total) by mouth 2 (two) times daily. Started by: Alfonse Spruce   diclofenac 75 MG EC tablet Commonly known as: VOLTAREN Take 75 mg by mouth in the morning and at bedtime.   docusate sodium 100 MG capsule Commonly known as: COLACE Take 300 mg by mouth in the morning.  donepezil 5 MG tablet Commonly known as: ARICEPT Take 5 mg by mouth at bedtime.   DULoxetine 60 MG capsule Commonly known as: CYMBALTA Take 60 mg by mouth every morning.   famotidine 20 MG tablet Commonly known as: PEPCID Take 1 tablet (20 mg total) by mouth 2 (two) times daily. What changed:  how much to take how to take this when to take this additional instructions Changed by: Alfonse Spruce   ferrous sulfate 325 (65 FE) MG tablet Take 1 tablet (325 mg total) by mouth daily.   Gemtesa 75 MG Tabs Generic drug: Vibegron Take 1 tablet by mouth daily.   hydrALAZINE 50 MG tablet Commonly known as: APRESOLINE Take 1 tablet (50 mg total) by mouth 2 (two) times daily.   HYDROcodone-acetaminophen 5-325  MG tablet Commonly known as: NORCO/VICODIN Take 1 tablet by mouth 2 (two) times daily as needed for moderate pain.   levothyroxine 50 MCG tablet Commonly known as: SYNTHROID Take 50 mcg by mouth daily before breakfast.   losartan 50 MG tablet Commonly known as: COZAAR Take 50 mg by mouth every evening.   Magnesium 250 MG Tabs Take 250 mg by mouth daily.   nebivolol 10 MG tablet Commonly known as: BYSTOLIC Take 1 tablet (10 mg total) by mouth at bedtime.   omeprazole 20 MG capsule Commonly known as: PRILOSEC Take 20 mg by mouth every morning.   pravastatin 80 MG tablet Commonly known as: PRAVACHOL Take 1 tablet (80 mg total) by mouth daily.   tacrolimus 0.03 % ointment Commonly known as: PROTOPIC Apply topically 2 (two) times daily. Started by: Alfonse Spruce   traMADol 50 MG tablet Commonly known as: ULTRAM Take 50 mg by mouth 2 (two) times daily as needed (pain.).   triamcinolone cream 0.1 % Commonly known as: KENALOG Apply 1 Application topically 2 (two) times daily. Avoid use on face or private areas        Birth History: non-contributory  Developmental History: non-contributory  Past Surgical History: Past Surgical History:  Procedure Laterality Date   ABDOMINAL HYSTERECTOMY     BIOPSY  10/02/2021   Procedure: BIOPSY;  Surgeon: Lanelle Bal, DO;  [facility]: [facility];  Service: Endoscopy;;   CATARACT EXTRACTION W/PHACO Right 01/22/2019   Procedure: CATARACT EXTRACTION PHACO AND INTRAOCULAR LENS PLACEMENT (IOC);  Surgeon: Fabio Pierce, MD;  Location: [facility];  Service: Ophthalmology;  Laterality: Right;  right, CDE: 19.63   CATARACT EXTRACTION W/PHACO Left 02/16/2019   Procedure: CATARACT EXTRACTION PHACO AND INTRAOCULAR LENS PLACEMENT LEFT EYE (CDE: 14.36);  Surgeon: Fabio Pierce, MD;  Location: [facility];  Service: Ophthalmology;  Laterality: Left;   CERVICAL FUSION  1990's   COLON SURGERY     COLONOSCOPY WITH PROPOFOL N/A 10/02/2021    Procedure: COLONOSCOPY WITH PROPOFOL;  Surgeon: Lanelle Bal, DO;  [facility];  Service: Endoscopy;  Laterality: N/A;  11:45am   ESOPHAGOGASTRODUODENOSCOPY (EGD) WITH PROPOFOL N/A 10/02/2021   Procedure: ESOPHAGOGASTRODUODENOSCOPY (EGD) WITH PROPOFOL;  Surgeon: Lanelle Bal, DO;  [facility];  Service: Endoscopy;  Laterality: N/A;   FOOT SURGERY Left    reshaped   HERNIA REPAIR     HIP ARTHROPLASTY Left    JOINT REPLACEMENT Right    hip   LUMBAR SPINE SURGERY     REPLACEMENT TOTAL KNEE Right    THYROIDECTOMY, PARTIAL     TOTAL KNEE ARTHROPLASTY Left 08/21/2016   Procedure: LEFT TOTAL KNEE ARTHROPLASTY;  Surgeon: Sheral Apley,  MD;  Location: [facility];  Service: Orthopedics;  Laterality: Left;     Family History: Family History  Problem Relation Age of Onset   Colon cancer Neg Hx    Colon polyps Neg Hx    Allergic rhinitis Neg Hx    Angioedema Neg Hx    Asthma Neg Hx    Eczema Neg Hx    Urticaria Neg Hx      Social History: [name] lives at home with her daughter and her daughter's family. They live in a house that is 76 years old. There is tile and wood flooring throughout the home. There is gas heating and central cooling. There are no animals inside or outside of the home. There is a dust mite covering on the bed, but not the pillow. There is no tobacco exposure in the home. She  is not exposed to fumes, chemicals, or dust in the environment. She is not a smoker.    Review of systems otherwise negative other than that mentioned in the HPI.    Objective:   Blood pressure 116/70, pulse (!) 101, temperature 97.9 F (36.6 C), resp. rate 18, height 4' 9.48" (1.46 m), weight 232 lb 8 oz (105.5 kg), SpO2 99%. Body mass index is 49.48 kg/m.     Physical Exam Constitutional:      Appearance: She is well-developed.  HENT:     Head: Normocephalic and atraumatic.     Right Ear: Tympanic membrane, ear canal and external ear normal. No drainage,  swelling or tenderness. Tympanic membrane is not injected, scarred, erythematous, retracted or bulging.     Left Ear: Tympanic membrane, ear canal and external ear normal. No drainage, swelling or tenderness. Tympanic membrane is not injected, scarred, erythematous, retracted or bulging.     Nose: No nasal deformity, septal deviation, mucosal edema or rhinorrhea.     Right Turbinates: Enlarged and pale.     Left Turbinates: Enlarged, swollen and pale.     Right Sinus: No maxillary sinus tenderness or frontal sinus tenderness.     Left Sinus: No maxillary sinus tenderness or frontal sinus tenderness.     Mouth/Throat:     Mouth: Mucous membranes are not pale and not dry.     Pharynx: Uvula midline.  Eyes:     General:        Right eye: No discharge.        Left eye: No discharge.     Conjunctiva/sclera: Conjunctivae normal.     Right eye: Right conjunctiva is not injected. No chemosis.    Left eye: Left conjunctiva is not injected. No chemosis.    Pupils: Pupils are equal, round, and reactive to light.  Cardiovascular:     Rate and Rhythm: Normal rate and regular rhythm.     Heart sounds: Normal heart sounds.  Pulmonary:     Effort: Pulmonary effort is normal. No tachypnea, accessory muscle usage or respiratory distress.     Breath sounds: Normal breath sounds. No wheezing, rhonchi or rales.  Chest:     Chest wall: No tenderness.  Abdominal:     Tenderness: There is no abdominal tenderness. There is no guarding or rebound.  Lymphadenopathy:     Head:     Right side of head: No submandibular, tonsillar or occipital adenopathy.     Left side of head: No submandibular, tonsillar or occipital adenopathy.     Cervical: No cervical adenopathy.  Skin:    General: Skin is warm.  Capillary Refill: Capillary refill takes less than 2 seconds.     Coloration: Skin is not pale.     Findings: No abrasion, erythema, petechiae or rash. Rash is not papular, urticarial or vesicular.      Comments: Multiple lesions over the bilateral arms. They are in various stages of healing. No oozing or crusting noted. No vesicular lesions.   Neurological:     Mental Status: She is alert.  Psychiatric:        Behavior: Behavior is cooperative.      Diagnostic studies: labs sent instead         Malachi Bonds, MD [facility]

## 2023-09-17 NOTE — Progress Notes (Unsigned)
 Referring Provider: Benita Stabile, MD Primary Care Physician:  Benita Stabile, MD Primary GI Physician: Dr. Marletta Lor  No chief complaint on file.   HPI:   [name] is a 78 y.o. female presenting today at the request of [name], NP for nausea and vomiting.  Today:    Reviewed recent labs 09/11/23: Sodium 124, AST 41, ALT 33.  Allergy testing is pending.  Labs 07/01/2023: CBC within normal limits aside from eosinophils slightly elevated at 0.7.  CMP remarkable for glucose 125, sodium 130, potassium 5.7, AST 44, ALT 29.  Alk phos and total bilirubin within normal limits.  Hemoglobin A1c 5.2.  TSH within normal limits.  Last colonoscopy 10/02/2021: Nonbleeding internal hemorrhoids, sigmoid diverticulosis, otherwise normal exam.  Recommended repeat exam in 10 years.  Last EGD 10/02/2021: 3 cm hiatal hernia, mild Schatzki's ring, gastritis biopsied. Recommended daily PPI.  Past Medical History:  Diagnosis Date   Anemia    Anxiety    Arthritis    Complication of anesthesia    "pt. holds her breath when waking up"   Depression    GERD (gastroesophageal reflux disease)    Hypercholesteremia    Hypertension    Hypothyroidism    Osteoporosis    PONV (postoperative nausea and vomiting)    Staph infection    abdomen and returned 5 yrs. later   TIA (transient ischemic attack)     Past Surgical History:  Procedure Laterality Date   ABDOMINAL HYSTERECTOMY     BIOPSY  10/02/2021   Procedure: BIOPSY;  Surgeon: Lanelle Bal, DO;  [facility];  Service: Endoscopy;;   CATARACT EXTRACTION W/PHACO Right 01/22/2019   Procedure: CATARACT EXTRACTION PHACO AND INTRAOCULAR LENS PLACEMENT (IOC);  Surgeon: Fabio Pierce, MD;  Location: [facility];  Service: Ophthalmology;  Laterality: Right;  right, CDE: 19.63   CATARACT EXTRACTION W/PHACO Left 02/16/2019   Procedure: CATARACT EXTRACTION PHACO AND INTRAOCULAR LENS PLACEMENT LEFT EYE (CDE: 14.36);  Surgeon: Fabio Pierce, MD;   Location: [facility];  Service: Ophthalmology;  Laterality: Left;   CERVICAL FUSION  1990's   COLON SURGERY     COLONOSCOPY WITH PROPOFOL N/A 10/02/2021   Procedure: COLONOSCOPY WITH PROPOFOL;  Surgeon: Lanelle Bal, DO;  [facility];  Service: Endoscopy;  Laterality: N/A;  11:45am   ESOPHAGOGASTRODUODENOSCOPY (EGD) WITH PROPOFOL N/A 10/02/2021   Procedure: ESOPHAGOGASTRODUODENOSCOPY (EGD) WITH PROPOFOL;  Surgeon: Lanelle Bal, DO;  [facility];  Service: Endoscopy;  Laterality: N/A;   FOOT SURGERY Left    reshaped   HERNIA REPAIR     HIP ARTHROPLASTY Left    JOINT REPLACEMENT Right    hip   LUMBAR SPINE SURGERY     REPLACEMENT TOTAL KNEE Right    THYROIDECTOMY, PARTIAL     TOTAL KNEE ARTHROPLASTY Left 08/21/2016   Procedure: LEFT TOTAL KNEE ARTHROPLASTY;  Surgeon: Sheral Apley, MD;  Location: [facility];  Service: Orthopedics;  Laterality: Left;    Current Outpatient Medications  Medication Sig Dispense Refill   ALPRAZolam (XANAX) 0.25 MG tablet Take 0.25 mg by mouth daily as needed for anxiety.     amLODipine (NORVASC) 5 MG tablet Take 1 tablet (5 mg total) by mouth daily. 90 tablet 3   aspirin EC 81 MG tablet Take 81 mg by mouth daily. Swallow whole.     augmented betamethasone dipropionate (DIPROLENE-AF) 0.05 % cream Apply topically 2 (two) times daily as needed.     carboxymethylcellulose (  REFRESH PLUS) 0.5 % SOLN 1-2 drops 3 (three) times daily as needed (dry/irritated eyes).     cetirizine (ZYRTEC) 10 MG tablet Take 1 tablet (10 mg total) by mouth 2 (two) times daily. 60 tablet 1   diclofenac (VOLTAREN) 75 MG EC tablet Take 75 mg by mouth in the morning and at bedtime.     docusate sodium (COLACE) 100 MG capsule Take 300 mg by mouth in the morning.     donepezil (ARICEPT) 5 MG tablet Take 5 mg by mouth at bedtime.     DULoxetine (CYMBALTA) 60 MG capsule Take 60 mg by mouth every morning.     famotidine (PEPCID) 20 MG tablet Take 1 tablet (20 mg total) by  mouth 2 (two) times daily. 60 tablet 1   ferrous sulfate 325 (65 FE) MG tablet Take 1 tablet (325 mg total) by mouth daily. (Patient not taking: Reported on 09/06/2023) 30 tablet 3   GEMTESA 75 MG TABS Take 1 tablet by mouth daily. (Patient not taking: Reported on 09/11/2023)     hydrALAZINE (APRESOLINE) 50 MG tablet Take 1 tablet (50 mg total) by mouth 2 (two) times daily. 180 tablet 1   HYDROcodone-acetaminophen (NORCO/VICODIN) 5-325 MG tablet Take 1 tablet by mouth 2 (two) times daily as needed for moderate pain. 10 tablet 0   levothyroxine (SYNTHROID) 50 MCG tablet Take 50 mcg by mouth daily before breakfast.      losartan (COZAAR) 50 MG tablet Take 50 mg by mouth every evening.     Magnesium 250 MG TABS Take 250 mg by mouth daily.     nebivolol (BYSTOLIC) 10 MG tablet Take 1 tablet (10 mg total) by mouth at bedtime. (Patient not taking: Reported on 09/11/2023) 30 tablet 3   omeprazole (PRILOSEC) 20 MG capsule Take 20 mg by mouth every morning.     pravastatin (PRAVACHOL) 80 MG tablet Take 1 tablet (80 mg total) by mouth daily. 90 tablet 1   tacrolimus (PROTOPIC) 0.03 % ointment Apply topically 2 (two) times daily. 100 g 3   traMADol (ULTRAM) 50 MG tablet Take 50 mg by mouth 2 (two) times daily as needed (pain.).     triamcinolone cream (KENALOG) 0.1 % Apply 1 Application topically 2 (two) times daily. Avoid use on face or private areas 80 g 0   No current facility-administered medications for this visit.    Allergies as of 09/18/2023 - Review Complete 09/11/2023  Allergen Reaction Noted   Carvedilol Other (See Comments) 09/22/2021   Sulfamethoxazole Swelling 04/24/2016    Family History  Problem Relation Age of Onset   Colon cancer Neg Hx    Colon polyps Neg Hx    Allergic rhinitis Neg Hx    Angioedema Neg Hx    Asthma Neg Hx    Eczema Neg Hx    Urticaria Neg Hx     Social History   Socioeconomic History   Marital status: Widowed    Spouse name: Not on file   Number of  children: Not on file   Years of education: Not on file   Highest education level: Not on file  Occupational History   Not on file  Tobacco Use   Smoking status: Never   Smokeless tobacco: Never  Vaping Use   Vaping status: Never Used  Substance and Sexual Activity   Alcohol use: Yes    Comment: occ   Drug use: No   Sexual activity: Not on file  Other Topics Concern   Not  on file  Social History Narrative   Not on file   Social Drivers of Health   Financial Resource Strain: Not on file  Food Insecurity: Not on file  Transportation Needs: Not on file  Physical Activity: Not on file  Stress: Not on file  Social Connections: Not on file    Review of Systems: Gen: Denies fever, chills, anorexia. Denies fatigue, weakness, weight loss.  CV: Denies chest pain, palpitations, syncope, peripheral edema, and claudication. Resp: Denies dyspnea at rest, cough, wheezing, coughing up blood, and pleurisy. GI: Denies vomiting blood, jaundice, and fecal incontinence.   Denies dysphagia or odynophagia. Derm: Denies rash, itching, dry skin Psych: Denies depression, anxiety, memory loss, confusion. No homicidal or suicidal ideation.  Heme: Denies bruising, bleeding, and enlarged lymph nodes.  Physical Exam: There were no vitals taken for this visit. General:   Alert and oriented. No distress noted. Pleasant and cooperative.  Head:  Normocephalic and atraumatic. Eyes:  Conjuctiva clear without scleral icterus. Heart:  S1, S2 present without murmurs appreciated. Lungs:  Clear to auscultation bilaterally. No wheezes, rales, or rhonchi. No distress.  Abdomen:  +BS, soft, non-tender and non-distended. No rebound or guarding. No HSM or masses noted. Msk:  Symmetrical without gross deformities. Normal posture. Extremities:  Without edema. Neurologic:  Alert and  oriented x4 Psych:  Normal mood and affect.    Assessment:     Plan:  ***   Shana Daring, PA-C [facility]  Gastroenterology 09/18/2023

## 2023-09-18 ENCOUNTER — Ambulatory Visit (INDEPENDENT_AMBULATORY_CARE_PROVIDER_SITE_OTHER): Admitting: Gastroenterology

## 2023-09-18 ENCOUNTER — Encounter: Payer: Self-pay | Admitting: Gastroenterology

## 2023-09-18 VITALS — BP 138/81 | HR 82 | Temp 97.6°F | Ht <= 58 in | Wt 233.6 lb

## 2023-09-18 DIAGNOSIS — R1013 Epigastric pain: Secondary | ICD-10-CM

## 2023-09-18 DIAGNOSIS — Z8719 Personal history of other diseases of the digestive system: Secondary | ICD-10-CM | POA: Diagnosis not present

## 2023-09-18 DIAGNOSIS — E871 Hypo-osmolality and hyponatremia: Secondary | ICD-10-CM

## 2023-09-18 DIAGNOSIS — R110 Nausea: Secondary | ICD-10-CM | POA: Diagnosis not present

## 2023-09-18 DIAGNOSIS — R1310 Dysphagia, unspecified: Secondary | ICD-10-CM

## 2023-09-18 DIAGNOSIS — K219 Gastro-esophageal reflux disease without esophagitis: Secondary | ICD-10-CM | POA: Diagnosis not present

## 2023-09-18 DIAGNOSIS — R197 Diarrhea, unspecified: Secondary | ICD-10-CM | POA: Diagnosis not present

## 2023-09-18 DIAGNOSIS — R6881 Early satiety: Secondary | ICD-10-CM

## 2023-09-18 MED ORDER — ONDANSETRON HCL 4 MG PO TABS: 4.0000 mg | ORAL_TABLET | Freq: Three times a day (TID) | ORAL | 1 refills | Status: DC | PRN

## 2023-09-18 MED ORDER — OMEPRAZOLE 40 MG PO CPDR: 40.0000 mg | DELAYED_RELEASE_CAPSULE | Freq: Every day | ORAL | 3 refills | Status: AC

## 2023-09-18 NOTE — Patient Instructions (Addendum)
 Please have blood work completed at [facility] to recheck your sodium level.  Please confirm whether or not you are taking sodium tablet. If not, please call whoever prescribed it and let them know.   Stop colace.   Monitor for persistent diarrhea. If you are still having diarrhea by Monday, please let me know and I will order additional testing.   It is possible that an allergy is contributing to your current symptoms of nausea and diarrhea.  I am not able to see all of your allergy testing results yet, but egg, wheat, and soy have come back positive.  I am not sure to what degree of allergy you have.  I recommend that you contact the allergist if you have not heard back from them by mid to latter part of next week to get recommendations from them.  I would like to get you scheduled for an upper endoscopy in the near future to further evaluate your upper abdominal pain, nausea, swallowing problems, but we will need to make sure that your sodium has improved prior to getting this scheduled.  For now, Increase omeprazole to 40 mg daily 30 minutes before breakfast.  Limit diclofenac as much as possible.  Continue to avoid all other over-the-counter NSAID products including ibuprofen, Aleve, Advil, BC powders, Goody powders, anything this is what NSAID" on the package.  Follow a GERD diet:  Avoid fried, fatty, greasy, spicy, citrus foods. Avoid caffeine and carbonated beverages. Avoid chocolate. Try eating 4-6 small meals a day rather than 3 large meals. Do not eat within 3 hours of laying down. Prop head of bed up on wood or bricks to create a 6 inch incline.   We will call you with your lab results and additional recommendations.    Shana Daring, PA-C [facility]

## 2023-09-19 LAB — ALPHA-GAL PANEL
Allergen Lamb IgE: 0.31 kU/L — AB
Beef IgE: 0.58 kU/L — AB
IgE (Immunoglobulin E), Serum: 3010 [IU]/mL — ABNORMAL HIGH (ref 6–495)
O215-IgE Alpha-Gal: 1.66 kU/L — AB
Pork IgE: 0.21 kU/L — AB

## 2023-09-19 LAB — BASIC METABOLIC PANEL WITH GFR
BUN/Creatinine Ratio: 18 (ref 12–28)
BUN: 15 mg/dL (ref 8–27)
CO2: 15 mmol/L — ABNORMAL LOW (ref 20–29)
Calcium: 9.8 mg/dL (ref 8.7–10.3)
Chloride: 93 mmol/L — ABNORMAL LOW (ref 96–106)
Creatinine, Ser: 0.85 mg/dL (ref 0.57–1.00)
Glucose: 112 mg/dL — ABNORMAL HIGH (ref 70–99)
Potassium: 5.6 mmol/L — ABNORMAL HIGH (ref 3.5–5.2)
Sodium: 133 mmol/L — ABNORMAL LOW (ref 134–144)
eGFR: 71 mL/min/{1.73_m2} (ref >59)

## 2023-09-20 ENCOUNTER — Other Ambulatory Visit: Payer: Self-pay | Admitting: Gastroenterology

## 2023-09-20 DIAGNOSIS — E875 Hyperkalemia: Secondary | ICD-10-CM

## 2023-09-23 ENCOUNTER — Encounter: Payer: Self-pay | Admitting: *Deleted

## 2023-09-23 ENCOUNTER — Other Ambulatory Visit: Payer: Self-pay | Admitting: *Deleted

## 2023-09-23 DIAGNOSIS — E875 Hyperkalemia: Secondary | ICD-10-CM

## 2023-09-24 LAB — ALLERGENS W/COMP RFLX AREA 2

## 2023-09-25 LAB — ALLERGENS W/COMP RFLX AREA 2
Cedar, Mountain IgE: 0.11 kU/L — AB
Cottonwood IgE: 0.17 kU/L — AB
D Pteronyssinus IgE: 0.13 kU/L — AB
E001-IgE Cat Dander: 0.11 kU/L — AB
E005-IgE Dog Dander: 0.47 kU/L — AB
E226-IgE Can f 5: 0.13 kU/L — AB
IgE (Immunoglobulin E), Serum: 3011 [IU]/mL — ABNORMAL HIGH (ref 6–495)
Johnson Grass IgE: 0.22 kU/L — AB
Johnson Grass IgE: 0.25 kU/L — AB
Maple/Box Elder IgE: 0.13 kU/L — AB
Oak, White IgE: 0.11 kU/L — AB
Pecan, Hickory IgE: 0.13 kU/L — AB
Penicillium Chrysogen IgE: 0.11 kU/L — AB

## 2023-09-25 LAB — CMP14+EGFR
ALT: 33 IU/L — ABNORMAL HIGH (ref 0–32)
AST: 41 IU/L — ABNORMAL HIGH (ref 0–40)
Albumin: 4.6 g/dL (ref 3.8–4.8)
Alkaline Phosphatase: 100 IU/L (ref 44–121)
BUN/Creatinine Ratio: 14 (ref 12–28)
BUN: 9 mg/dL (ref 8–27)
Bilirubin Total: 0.5 mg/dL (ref 0.0–1.2)
CO2: 17 mmol/L — ABNORMAL LOW (ref 20–29)
Calcium: 10 mg/dL (ref 8.7–10.3)
Chloride: 85 mmol/L — ABNORMAL LOW (ref 96–106)
Creatinine, Ser: 0.66 mg/dL (ref 0.57–1.00)
Globulin, Total: 3.1 g/dL (ref 1.5–4.5)
Glucose: 107 mg/dL — ABNORMAL HIGH (ref 70–99)
Potassium: 5.1 mmol/L (ref 3.5–5.2)
Sodium: 124 mmol/L — ABNORMAL LOW (ref 134–144)
Total Protein: 7.7 g/dL (ref 6.0–8.5)
eGFR: 91 mL/min/{1.73_m2} (ref >59)

## 2023-09-25 LAB — ALLERGY PANEL 19, SEAFOOD GROUP: Shrimp IgE: 0.26 kU/L — AB

## 2023-09-25 LAB — FANA STAINING PATTERNS
Nucleolar Pattern: 1:80 {titer}
Speckled Pattern: 1:80 {titer}

## 2023-09-25 LAB — ALLERGY PANEL 18, NUT MIX GROUP
Allergen Coconut IgE: <0.1 kU/L
F020-IgE Almond: 0.11 kU/L — AB
F202-IgE Cashew Nut: <0.1 kU/L
Hazelnut (Filbert) IgE: <0.1 kU/L
Peanut IgE: 0.18 kU/L — AB
Pecan Nut IgE: <0.1 kU/L
Sesame Seed IgE: 0.14 kU/L — AB

## 2023-09-25 LAB — PANEL 606648
E101-IgE Can f 1: <0.1 kU/L
E102-IgE Can f 2: <0.1 kU/L
E221-IgE Can f 3: <0.1 kU/L
E226-IgE Can f 5: 0.82 kU/L — AB

## 2023-09-25 LAB — ANTINUCLEAR ANTIBODIES, IFA: ANA Titer 1: POSITIVE — AB

## 2023-09-25 LAB — F245-IGE EGG, WHOLE: Egg, Whole IgE: 1.61 kU/L — AB

## 2023-09-25 LAB — TRYPTASE: Tryptase: 5.1 ug/L (ref 2.2–13.2)

## 2023-09-25 LAB — ALLERGEN MILK: Milk IgE: 8.09 kU/L — AB

## 2023-09-25 LAB — THYROID ANTIBODIES (THYROPEROXIDASE & THYROGLOBULIN)
Thyroglobulin Antibody: <1 [IU]/mL (ref 0.0–0.9)
Thyroperoxidase Ab SerPl-aCnc: <9 [IU]/mL (ref 0–34)

## 2023-09-25 LAB — C-REACTIVE PROTEIN: CRP: 7 mg/L (ref 0–10)

## 2023-09-25 LAB — SEDIMENTATION RATE: Sed Rate: 17 mm/h (ref 0–40)

## 2023-09-25 LAB — ALLERGEN, WHEAT, F4: Wheat IgE: 0.86 kU/L — AB

## 2023-09-25 LAB — ALLERGEN SOYBEAN: Soybean IgE: 0.2 kU/L — AB

## 2023-09-25 LAB — ALLERGEN COMPONENT COMMENTS

## 2023-09-27 ENCOUNTER — Encounter: Payer: Self-pay | Admitting: Allergy & Immunology

## 2023-09-30 ENCOUNTER — Encounter: Payer: Self-pay | Admitting: Allergy & Immunology

## 2023-10-02 ENCOUNTER — Other Ambulatory Visit (HOSPITAL_COMMUNITY): Payer: Self-pay | Admitting: Nurse Practitioner

## 2023-10-02 DIAGNOSIS — M810 Age-related osteoporosis without current pathological fracture: Secondary | ICD-10-CM

## 2023-10-02 DIAGNOSIS — Z1231 Encounter for screening mammogram for malignant neoplasm of breast: Secondary | ICD-10-CM

## 2023-10-03 ENCOUNTER — Ambulatory Visit

## 2023-10-04 ENCOUNTER — Encounter (HOSPITAL_COMMUNITY): Payer: Self-pay

## 2023-10-04 ENCOUNTER — Other Ambulatory Visit: Payer: Self-pay

## 2023-10-04 ENCOUNTER — Encounter (HOSPITAL_COMMUNITY)
Admission: RE | Admit: 2023-10-04 | Discharge: 2023-10-04 | Disposition: A | Discharge status: Home / Self Care | Source: Ambulatory Visit | Attending: Internal Medicine | Admitting: Internal Medicine

## 2023-10-04 DIAGNOSIS — E871 Hypo-osmolality and hyponatremia: Secondary | ICD-10-CM

## 2023-10-04 DIAGNOSIS — I10 Essential (primary) hypertension: Secondary | ICD-10-CM

## 2023-10-04 NOTE — Pre-Procedure Instructions (Signed)
 Patient states she is unable to come for her PAT visit today. PAT completed over the phone with the patient. Patient states she is still planning on coming to have EGD Monday. Patient aware her potassium level is high and not to eat foods with potassium. Also I did let her know that we will do blood work day of surgery and if K+ high she may be cancelled.

## 2023-10-04 NOTE — Patient Instructions (Addendum)
 20    Your procedure is scheduled on: 10/07/2023  Report to [facility] Main Entrance at  11:00   AM.  Call this number if you have problems the morning of surgery: [phone]   Remember:  You may have CLEAR liquids until 9:00 am, water, tea, soda, and/or black coffee  NO CREAM OR MILK   Follow instructions on letter from office regarding when to stop eating and drinking        No Smoking the day of procedure      Take these medicines the morning of surgery with A SIP OF WATER: Amlodipine , cymbalta , pepcid , hydralazine , levothyroxine , omeprazole  and tramadol if needed   Do not wear jewelry, make-up or nail polish.  Do not wear lotions, powders, or perfumes. You may wear deodorant.                Do not bring valuables to the hospital.  Contacts, dentures or bridgework may not be worn into surgery.  Leave suitcase in the car. After surgery it may be brought to your room.  For patients admitted to the hospital, checkout time is 11:00 AM the day of discharge.   Patients discharged the day of surgery will not be allowed to drive home. Upper Endoscopy, Adult Upper endoscopy is a procedure to look inside the upper GI (gastrointestinal) tract. The upper GI tract is made up of: The part of the body that moves food from your mouth to your stomach (esophagus). The stomach. The first part of your small intestine (duodenum). This procedure is also called esophagogastroduodenoscopy (EGD) or gastroscopy. In this procedure, your health care provider passes a thin, flexible tube (endoscope) through your mouth and down your esophagus into your stomach. A small camera is attached to the end of the tube. Images from the camera appear on a monitor in the exam room. During this procedure, your health care provider may also remove a small piece of tissue to be sent to a lab and examined under a microscope (biopsy). Your health care provider may do an upper endoscopy to diagnose cancers of the upper GI tract. You  may also have this procedure to find the cause of other conditions, such as: Stomach pain. Heartburn. Pain or problems when swallowing. Nausea and vomiting. Stomach bleeding. Stomach ulcers. Tell a health care provider about: Any allergies you have. All medicines you are taking, including vitamins, herbs, eye drops, creams, and over-the-counter medicines. Any problems you or family members have had with anesthetic medicines. Any blood disorders you have. Any surgeries you have had. Any medical conditions you have. Whether you are pregnant or may be pregnant. What are the risks? Generally, this is a safe procedure. However, problems may occur, including: Infection. Bleeding. Allergic reactions to medicines. A tear or hole (perforation) in the esophagus, stomach, or duodenum. What happens before the procedure? Staying hydrated Follow instructions from your health care provider about hydration, which may include: Up to 4 hours before the procedure - you may continue to drink clear liquids, such as water, clear fruit juice, black coffee, and plain tea.   Medicines Ask your health care provider about: Changing or stopping your regular medicines. This is especially important if you are taking diabetes medicines or blood thinners. Taking medicines such as aspirin  and ibuprofen. These medicines can thin your blood. Do not take these medicines unless your health care provider tells you to take them. Taking over-the-counter medicines, vitamins, herbs, and supplements. General instructions Plan to have someone take you  home from the hospital or clinic. If you will be going home right after the procedure, plan to have someone with you for 24 hours. Ask your health care provider what steps will be taken to help prevent infection. What happens during the procedure?  An IV will be inserted into one of your veins. You may be given one or more of the following: A medicine to help you relax  (sedative). A medicine to numb the throat (local anesthetic). You will lie on your left side on an exam table. Your health care provider will pass the endoscope through your mouth and down your esophagus. Your health care provider will use the scope to check the inside of your esophagus, stomach, and duodenum. Biopsies may be taken. The endoscope will be removed. The procedure may vary among health care providers and hospitals. What happens after the procedure? Your blood pressure, heart rate, breathing rate, and blood oxygen level will be monitored until you leave the hospital or clinic. Do not drive for 24 hours if you were given a sedative during your procedure. When your throat is no longer numb, you may be given some fluids to drink. It is up to you to get the results of your procedure. Ask your health care provider, or the department that is doing the procedure, when your results will be ready. Summary Upper endoscopy is a procedure to look inside the upper GI tract. During the procedure, an IV will be inserted into one of your veins. You may be given a medicine to help you relax. A medicine will be used to numb your throat. The endoscope will be passed through your mouth and down your esophagus. This information is not intended to replace advice given to you by your health care provider. Make sure you discuss any questions you have with your health care provider. Document Revised: 11/13/2017 Document Reviewed: 10/21/2017 Elsevier Patient Education  2020 [employer]                                                                                                                                      EndoscopyCare After  Please read the instructions outlined below and refer to this sheet in the next few weeks. These discharge instructions provide you with general information on caring for yourself after you leave the hospital. Your doctor may also give you specific instructions. While  your treatment has been planned according to the most current medical practices available, unavoidable complications occasionally occur. If you have any problems or questions after discharge, please call your doctor. HOME CARE INSTRUCTIONS Activity You may resume your regular activity but move at a slower pace for the next 24 hours.  Take frequent rest periods for the next 24 hours.  Walking will help expel (get rid of) the air and reduce the bloated feeling in your abdomen.  No driving for 24 hours (because of the anesthesia (medicine) used  during the test).  You may shower.  Do not sign any important legal documents or operate any machinery for 24 hours (because of the anesthesia used during the test).  Nutrition Drink plenty of fluids.  You may resume your normal diet.  Begin with a light meal and progress to your normal diet.  Avoid alcoholic beverages for 24 hours or as instructed by your caregiver.  Medications You may resume your normal medications unless your caregiver tells you otherwise. What you can expect today You may experience abdominal discomfort such as a feeling of fullness or "gas" pains.  You may experience a sore throat for 2 to 3 days. This is normal. Gargling with salt water may help this.  Follow-up Your doctor will discuss the results of your test with you. SEEK IMMEDIATE MEDICAL CARE IF: You have excessive nausea (feeling sick to your stomach) and/or vomiting.  You have severe abdominal pain and distention (swelling).  You have trouble swallowing.  You have a temperature over 100 F (37.8 C).  You have rectal bleeding or vomiting of blood.  Document Released: 01/03/2004 Document Revised: 05/10/2011 Document Reviewed: 07/16/2007

## 2023-10-07 ENCOUNTER — Encounter (HOSPITAL_COMMUNITY): Payer: Self-pay | Admitting: Anesthesiology

## 2023-10-07 ENCOUNTER — Telehealth (INDEPENDENT_AMBULATORY_CARE_PROVIDER_SITE_OTHER): Payer: Self-pay | Admitting: *Deleted

## 2023-10-07 ENCOUNTER — Ambulatory Visit (HOSPITAL_COMMUNITY): Admission: RE | Admit: 2023-10-07 | Source: Home / Self Care | Admitting: Internal Medicine

## 2023-10-07 ENCOUNTER — Encounter (HOSPITAL_COMMUNITY): Admission: RE | Payer: Self-pay | Source: Home / Self Care

## 2023-10-07 SURGERY — EGD (ESOPHAGOGASTRODUODENOSCOPY)
Anesthesia: Choice

## 2023-10-07 NOTE — OR Nursing (Signed)
 [name] called to cancel Endo procedure due to illness.

## 2023-10-07 NOTE — Telephone Encounter (Signed)
 Patient called into endo for procedure today to cancel. She is sick.

## 2023-10-10 ENCOUNTER — Ambulatory Visit: Admitting: *Deleted

## 2023-10-10 VITALS — BP 140/96 | HR 89

## 2023-10-14 ENCOUNTER — Other Ambulatory Visit (HOSPITAL_COMMUNITY)

## 2023-10-14 ENCOUNTER — Ambulatory Visit (HOSPITAL_COMMUNITY)

## 2023-11-08 ENCOUNTER — Other Ambulatory Visit: Payer: Self-pay | Admitting: Gastroenterology

## 2023-11-08 ENCOUNTER — Other Ambulatory Visit: Payer: Self-pay | Admitting: Allergy & Immunology

## 2023-11-08 DIAGNOSIS — R110 Nausea: Secondary | ICD-10-CM

## 2023-11-26 DIAGNOSIS — R5381 Other malaise: Secondary | ICD-10-CM | POA: Diagnosis not present

## 2023-11-26 DIAGNOSIS — E039 Hypothyroidism, unspecified: Secondary | ICD-10-CM | POA: Diagnosis not present

## 2023-11-26 DIAGNOSIS — R296 Repeated falls: Secondary | ICD-10-CM | POA: Diagnosis not present

## 2023-11-26 DIAGNOSIS — I10 Essential (primary) hypertension: Secondary | ICD-10-CM | POA: Diagnosis not present

## 2023-11-26 DIAGNOSIS — F1010 Alcohol abuse, uncomplicated: Secondary | ICD-10-CM | POA: Diagnosis not present

## 2023-11-26 DIAGNOSIS — E782 Mixed hyperlipidemia: Secondary | ICD-10-CM | POA: Diagnosis not present

## 2023-11-30 DIAGNOSIS — L89891 Pressure ulcer of other site, stage 1: Secondary | ICD-10-CM | POA: Diagnosis not present

## 2023-11-30 DIAGNOSIS — L89892 Pressure ulcer of other site, stage 2: Secondary | ICD-10-CM | POA: Diagnosis not present

## 2023-12-03 DIAGNOSIS — I10 Essential (primary) hypertension: Secondary | ICD-10-CM | POA: Diagnosis not present

## 2023-12-03 DIAGNOSIS — R4189 Other symptoms and signs involving cognitive functions and awareness: Secondary | ICD-10-CM | POA: Diagnosis not present

## 2023-12-03 DIAGNOSIS — R5381 Other malaise: Secondary | ICD-10-CM | POA: Diagnosis not present

## 2023-12-12 DIAGNOSIS — K449 Diaphragmatic hernia without obstruction or gangrene: Secondary | ICD-10-CM | POA: Diagnosis not present

## 2023-12-12 DIAGNOSIS — M1990 Unspecified osteoarthritis, unspecified site: Secondary | ICD-10-CM | POA: Diagnosis not present

## 2023-12-12 DIAGNOSIS — I10 Essential (primary) hypertension: Secondary | ICD-10-CM | POA: Diagnosis not present

## 2023-12-12 DIAGNOSIS — E782 Mixed hyperlipidemia: Secondary | ICD-10-CM | POA: Diagnosis not present

## 2023-12-12 DIAGNOSIS — G629 Polyneuropathy, unspecified: Secondary | ICD-10-CM | POA: Diagnosis not present

## 2023-12-12 DIAGNOSIS — M810 Age-related osteoporosis without current pathological fracture: Secondary | ICD-10-CM | POA: Diagnosis not present

## 2023-12-12 DIAGNOSIS — F329 Major depressive disorder, single episode, unspecified: Secondary | ICD-10-CM | POA: Diagnosis not present

## 2023-12-12 DIAGNOSIS — K219 Gastro-esophageal reflux disease without esophagitis: Secondary | ICD-10-CM | POA: Diagnosis not present

## 2023-12-12 DIAGNOSIS — E039 Hypothyroidism, unspecified: Secondary | ICD-10-CM | POA: Diagnosis not present

## 2023-12-13 ENCOUNTER — Ambulatory Visit: Attending: Nurse Practitioner | Admitting: Nurse Practitioner

## 2023-12-13 ENCOUNTER — Encounter: Payer: Self-pay | Admitting: Nurse Practitioner

## 2023-12-13 VITALS — BP 144/76 | HR 91 | Ht 59.0 in | Wt 224.2 lb

## 2023-12-13 DIAGNOSIS — Z79899 Other long term (current) drug therapy: Secondary | ICD-10-CM

## 2023-12-13 DIAGNOSIS — R252 Cramp and spasm: Secondary | ICD-10-CM

## 2023-12-13 DIAGNOSIS — E785 Hyperlipidemia, unspecified: Secondary | ICD-10-CM | POA: Diagnosis not present

## 2023-12-13 DIAGNOSIS — I272 Pulmonary hypertension, unspecified: Secondary | ICD-10-CM

## 2023-12-13 DIAGNOSIS — G4733 Obstructive sleep apnea (adult) (pediatric): Secondary | ICD-10-CM | POA: Diagnosis not present

## 2023-12-13 DIAGNOSIS — E876 Hypokalemia: Secondary | ICD-10-CM

## 2023-12-13 DIAGNOSIS — I10 Essential (primary) hypertension: Secondary | ICD-10-CM | POA: Diagnosis not present

## 2023-12-13 DIAGNOSIS — R0609 Other forms of dyspnea: Secondary | ICD-10-CM | POA: Diagnosis not present

## 2023-12-13 DIAGNOSIS — E871 Hypo-osmolality and hyponatremia: Secondary | ICD-10-CM | POA: Diagnosis not present

## 2023-12-13 DIAGNOSIS — E782 Mixed hyperlipidemia: Secondary | ICD-10-CM

## 2023-12-13 DIAGNOSIS — E875 Hyperkalemia: Secondary | ICD-10-CM

## 2023-12-13 NOTE — Progress Notes (Unsigned)
 Cardiology Office Note   Date:  12/13/2023 ID:  [name], DOB 07-12-1945, MRN [id number] PCP: Shona Norleen PEDLAR, MD  [facility] Providers Cardiologist:  Alvan Carrier, MD     History of Present Illness [name] is a 78 y.o. female with a PMH of SHOB, HTN, leg edema, hx of TIA in 2021, HLD, OSA on CPAP, and palpitations, who presents today for follow-up.   Last seen by Dr. Carrier Alvan on January 08, 2023. Was overall doing well, pravastatin  increased to improve LDL.   07/26/2023 - Today she presents for follow-up.  She admits to labile blood pressure readings and admits to dyspnea on exertion.  Says dyspnea on exertion is her chief concern and will have to sit and take breaks.  Says she goes about 15 feet and gets short of breath, then begins to start gagging.  This is new for her and has been going on since before Christmas of 2024.  Family member in room says she has been also dealing with recurrent UTIs. Denies any chest pain, palpitations, syncope, presyncope, dizziness, orthopnea, PND, swelling or significant weight changes, acute bleeding, or claudication.  She says she has been dealing with a rash along her forearms/hands and not sure what this is due to.  Says she will be seeing a dermatologist soon regarding this.   09/06/2023 -  She presents today via telephone visit as she is not feeling well and says she is feeling sick today.  She says as long as she sits still, she does okay.  Says that she eats anything, it will cause her to go straight to the bathroom.  She walks more than 20 feet, she begins gagging/vomiting.  Says the symptoms have been persistent for quite a while and usually occurs after getting up, says symptoms are now more persistent than they used to be.  She says previously when she had lab work performed that showed elevated potassium levels, she took some liquid IV and wonders if this is due to this.  Breathing is stable from last office visit. Denies  any chest pain, palpitations, syncope, presyncope, dizziness, orthopnea, PND, swelling or significant weight changes, acute bleeding, or claudication.   12/13/2023 - Here for follow-up in the office with her daughter. Tells me she is doing better since last telephone visit. Has been to an allergist and found out she is allergic to dogs. Tells me her breathing has improved. Denies any chest pain. Continues to note episodes of dry heaving/gagging with exertion, only relieved when sitting down, this has been chronic and happening over 6 months. Denies any chest pain, worsening shortness of breath, palpitations, syncope, presyncope, dizziness, orthopnea, PND, swelling or significant weight changes, acute bleeding, or claudication. Does admit to leg cramps.    ROS: Negative. See HPI.   Studies Reviewed  EKG: EKG is not ordered today.      Echo 08/2023:  1. Left ventricular ejection fraction, by estimation, is 60 to 65%. Left  ventricular ejection fraction by 3D volume is 60 %. The left ventricle has  normal function. The left ventricle has no regional wall motion  abnormalities. Left ventricular diastolic   parameters were normal.   2. Right ventricular systolic function is normal. The right ventricular  size is normal. There is moderately elevated pulmonary artery systolic  pressure. The estimated right ventricular systolic pressure is 52.0 mmHg.   3. The mitral valve is normal in structure. Trivial mitral valve  regurgitation. No evidence of mitral  stenosis.   4. The aortic valve is tricuspid. There is mild calcification of the  aortic valve. Aortic valve regurgitation is not visualized. No aortic  stenosis is present. Aortic valve mean gradient measures 6.0 mmHg.   5. The inferior vena cava is normal in size with greater than 50%  respiratory variability, suggesting right atrial pressure of 3 mmHg.   6. Increased flow velocities may be secondary to anemia, thyrotoxicosis,  hyperdynamic or  high flow state.   Comparison(s): Changes from prior study are noted. Moderate pulmonary HTN  present and stable LVEF.   Cardiac monitor 06/2020: 7 day monitor Min HR 55, Max HR 119, Avg HR 69 Frequent supraventricular ectopy in the form of isolated PACs, couplets, triplets Rare ventricular ectopy in the form of isolated PVCs. Reported symptoms correlated with sinus rhythm with PACs  Limited echocardiogram 06/2019: 1. Left ventricular ejection fraction, by visual estimation, is 60 to  65%. The left ventricle has normal function. There is mildly increased  left ventricular wall thickness.   2. Left ventricular diastolic parameters are consistent with Grade I  diastolic dysfunction (impaired relaxation).   3. The left ventricle has no regional wall motion abnormalities.   4. Global right ventricle has normal systolc function.The right  ventricular size is normal. no increase in right ventricular wall  thickness.   5. Left atrial size was moderately dilated.   6. Right atrial size was mildly dilated.   7. The mitral valve is grossly normal. Mild mitral valve regurgitation.   8. The tricuspid valve was grossly normal. Tricuspid valve regurgitation  is mild.   9. Tricuspid valve regurgitation is mild.  10. No evidence of aortic valve sclerosis or stenosis.  11. Moderately elevated pulmonary artery systolic pressure.  12. The interatrial septum was not well visualized.  Physical Exam VS:  BP (!) 144/76 (BP Location: Left Wrist)   Pulse 91   Ht 4' 11 (1.499 m)   Wt 224 lb 3.2 oz (101.7 kg)   SpO2 96%   BMI 45.28 kg/m        Wt Readings from Last 3 Encounters:  12/13/23 224 lb 3.2 oz (101.7 kg)  10/04/23 233 lb 9.6 oz (106 kg)  09/18/23 233 lb 9.6 oz (106 kg)    GEN: Morbidly obese 78 y.o. female in no acute distress NECK: No JVD; No carotid bruits CARDIAC: S1/S2, RRR, no murmurs, rubs, gallops RESPIRATORY:  Clear to auscultation without rales, wheezing or rhonchi  ABDOMEN:  Soft, non-tender, non-distended EXTREMITIES:  No edema; No deformity   ASSESSMENT AND PLAN  1.  DOE, pulmonary HTN, leg cramps Etiology unclear. DOE has improved. Echo 08/2023 revealed normal EF, stable moderately elevated PASP since 2021. Group 3 PAH. Will obtain labs for further evaluation of pulmonary HTN: BMET, Mag, and proBNP. Weight is stable over the past several months.  Continue current medication regimen.  Continue follow-up with PCP.  Care and ED precautions discussed.    2.  Hypertension Blood pressure borderline elevated, denies any BP issues at home.  Discussed to monitor BP at home at least 2 hours after medications and sitting for 5-10 minutes.  No medication changes at this time.  Heart healthy diet recommended.  Obtaining labs as mentioned above.   3.  Hyperlipidemia LDL 87 in January 2025.  Continue pravastatin . Heart healthy diet and regular cardiovascular exercise encouraged.   4. Hyponatremia Last Na level was 129, denies any red flag signs or symptoms. Will recheck a BMET as noted above.  Continue to follow with PCP.  4. OSA on CPAP  Encouraged continued compliance.   Medication Adjustments/Labs and Tests Ordered: Current medicines are reviewed at length with the patient today.  Concerns regarding medicines are outlined above.   Tests Ordered: BMET, Mag, and proBNP  Dispo: Care and ED precautions discussed. Follow-up with me/APP in 3 month or sooner if anything changes.   Signed, Almarie Crate, NP

## 2023-12-13 NOTE — Patient Instructions (Addendum)
 Medication Instructions:  Your physician recommends that you continue on your current medications as directed. Please refer to the Current Medication list given to you today.  Labwork: In 2-3 weeks at [facility]   Testing/Procedures: None   Follow-Up: Your physician recommends that you schedule a follow-up appointment in: 3 Months   Any Other Special Instructions Will Be Listed Below (If Applicable).  If you need a refill on your cardiac medications before your next appointment, please call your pharmacy.

## 2023-12-18 DIAGNOSIS — R399 Unspecified symptoms and signs involving the genitourinary system: Secondary | ICD-10-CM | POA: Diagnosis not present

## 2023-12-19 DIAGNOSIS — N390 Urinary tract infection, site not specified: Secondary | ICD-10-CM | POA: Diagnosis not present

## 2023-12-19 DIAGNOSIS — E871 Hypo-osmolality and hyponatremia: Secondary | ICD-10-CM | POA: Diagnosis not present

## 2023-12-20 ENCOUNTER — Other Ambulatory Visit: Payer: Self-pay

## 2023-12-20 ENCOUNTER — Ambulatory Visit: Admitting: Family Medicine

## 2023-12-20 ENCOUNTER — Encounter: Payer: Self-pay | Admitting: Family Medicine

## 2023-12-20 VITALS — BP 132/70 | HR 94 | Temp 98.0°F | Resp 16 | Ht <= 58 in | Wt 224.2 lb

## 2023-12-20 DIAGNOSIS — E782 Mixed hyperlipidemia: Secondary | ICD-10-CM | POA: Diagnosis not present

## 2023-12-20 DIAGNOSIS — G629 Polyneuropathy, unspecified: Secondary | ICD-10-CM | POA: Diagnosis not present

## 2023-12-20 DIAGNOSIS — M1990 Unspecified osteoarthritis, unspecified site: Secondary | ICD-10-CM | POA: Diagnosis not present

## 2023-12-20 DIAGNOSIS — K449 Diaphragmatic hernia without obstruction or gangrene: Secondary | ICD-10-CM | POA: Diagnosis not present

## 2023-12-20 DIAGNOSIS — J3089 Other allergic rhinitis: Secondary | ICD-10-CM | POA: Diagnosis not present

## 2023-12-20 DIAGNOSIS — F329 Major depressive disorder, single episode, unspecified: Secondary | ICD-10-CM | POA: Diagnosis not present

## 2023-12-20 DIAGNOSIS — M810 Age-related osteoporosis without current pathological fracture: Secondary | ICD-10-CM | POA: Diagnosis not present

## 2023-12-20 DIAGNOSIS — T7800XD Anaphylactic reaction due to unspecified food, subsequent encounter: Secondary | ICD-10-CM | POA: Diagnosis not present

## 2023-12-20 DIAGNOSIS — I10 Essential (primary) hypertension: Secondary | ICD-10-CM | POA: Diagnosis not present

## 2023-12-20 DIAGNOSIS — L509 Urticaria, unspecified: Secondary | ICD-10-CM

## 2023-12-20 DIAGNOSIS — L508 Other urticaria: Secondary | ICD-10-CM

## 2023-12-20 DIAGNOSIS — K219 Gastro-esophageal reflux disease without esophagitis: Secondary | ICD-10-CM | POA: Diagnosis not present

## 2023-12-20 DIAGNOSIS — T7800XA Anaphylactic reaction due to unspecified food, initial encounter: Secondary | ICD-10-CM

## 2023-12-20 DIAGNOSIS — E039 Hypothyroidism, unspecified: Secondary | ICD-10-CM | POA: Diagnosis not present

## 2023-12-20 DIAGNOSIS — J302 Other seasonal allergic rhinitis: Secondary | ICD-10-CM | POA: Diagnosis not present

## 2023-12-20 MED ORDER — EPINEPHRINE 0.3 MG/0.3ML IJ SOAJ: 0.3000 mg | Freq: Once | INTRAMUSCULAR | 1 refills | Status: AC

## 2023-12-20 NOTE — Progress Notes (Signed)
 [address] [place]  [place] [address] Dept: [phone]  FOLLOW UP NOTE  Patient ID: [name], female    DOB: 09-25-1945  Age: 78 y.o. MRN: [id number] Date of Office Visit: 12/20/2023  Assessment  Chief Complaint: Establish Care and Follow-up (Rash has much improved no itching,still have some area on her back but an scab is present)  HPI [name] is a 78 year old female who presents to the clinic for a follow up visit. She was last seen in this clinic for a follow up visit. She was last seen in this clinic on 09/11/2023 by Dr. Iva as a new patient for evaluation of chronic urticaria, allergic rhinitis, alpha gal allergy , food allergy  to milk and egg, and atopic dermatitis.  She is accompanied by her daughter who assists with history.  At today's visit, she reports her hives and itch have resolved since moving to [place] [facility] recently.  She reports that she has not had any episodes of urticarial breakout over the last several weeks.  Allergic rhinitis is reported as moderately well-controlled with occasional symptoms including rhinorrhea and nasal congestion.  She continues cetirizine  10 mg once a day and is not currently using any nasal steroid or nasal saline rinses.  Her last environmental allergy  testing via lab on 09/11/2023 was positive to grass pollen, tree pollen, indoor mold, dust mite, cat, dog, and cockroach.  She continues to consume mammalian meats with no adverse effects including hives, throat closing, abdominal pain or diarrhea.  Her last alpha gal testing via lab on 09/11/2023 indicated alpha gal IgE 1.66, lamb IgE 0.31, beef IgE 0.58, and pork IgE 0.21.  She is currently eating foods containing baked egg as well as stovetop eggs with no adverse reaction.  Her last testing was positive, however, clinically insignificant as she is currently consuming egg in all forms without adverse reaction.  She continues to avoid milk with no  accidental ingestion or EpiPen  use since her last visit to this clinic.  While reviewing labs it was noted that her total IgE was 3010.  We discussed further testing including SPEP and UPEP.  She is interested in rechecking total IgE before moving forward with additional testing.  She reports that she has upcoming lab draw next week and will get the total IgE drawn at that time and we will move forward once we get the results of this testing.  Her current medications are listed in the chart.  Drug Allergies:  Allergies  Allergen Reactions   Carvedilol Other (See Comments)    Weaknesses/shortness of breath/dyspepsia/cramps   Sulfamethoxazole Swelling    Lips swell and feel numb    Physical Exam: BP 132/70 (BP Location: Left Arm, Patient Position: Sitting, Cuff Size: Normal)   Pulse 94   Temp 98 F (36.7 C) (Temporal)   Resp 16   Ht 4' 9.48 (1.46 m)   Wt 224 lb 3.2 oz (101.7 kg)   SpO2 96%   BMI 47.71 kg/m    Physical Exam Vitals reviewed.  Constitutional:      Appearance: Normal appearance.  HENT:     Head: Normocephalic and atraumatic.     Right Ear: Tympanic membrane normal.     Left Ear: Tympanic membrane normal.     Nose:     Comments: Bilateral nares slightly erythematous with thin clear nasal drainage noted.  Pharynx normal.  Ears normal.  Eyes normal.    Mouth/Throat:     Pharynx: Oropharynx is  clear.  Eyes:     Conjunctiva/sclera: Conjunctivae normal.  Cardiovascular:     Rate and Rhythm: Normal rate and regular rhythm.     Heart sounds: Normal heart sounds. No murmur heard. Pulmonary:     Effort: Pulmonary effort is normal.     Breath sounds: Normal breath sounds.     Comments: Lungs clear to auscultation Musculoskeletal:        General: Normal range of motion.     Cervical back: Normal range of motion and neck supple.  Skin:    General: Skin is warm and dry.     Comments: Skin thinned overall.  Scattered small hematomas noted.  No urticarial rash noted   Neurological:     Mental Status: She is alert.      Assessment and Plan: 1. Hives   2. Seasonal and perennial allergic rhinitis   3. Allergy  with anaphylaxis due to food     Meds ordered this encounter  Medications   EPINEPHrine  (EPIPEN  2-PAK) 0.3 mg/0.3 mL IJ SOAJ injection    Sig: Inject 0.3 mg into the muscle once for 1 dose.    Dispense:  1 each    Refill:  1    Patient Instructions  Allergic rhinitis Continue allergen avoidance measures directed toward grass pollen, tree pollen, indoor mold, dust mite, cat, dog, and cockroach as listed below Continue cetirizine  10 mg once a day if needed for a runny nose or itch. You may take an additional dose of cetirizine  10 mg once a day if needed for breakthrough symptoms  Hives (urticaria) Take the least amount of medications while ramining hive free Cetirizine  (Zyrtec ) 10mg  twice a day and famotidine  (Pepcid ) 20 mg twice a day. If no symptoms for 7-14 days then decrease to. Cetirizine  (Zyrtec ) 10mg  twice a day and famotidine  (Pepcid ) 20 mg once a day.  If no symptoms for 7-14 days then decrease to. Cetirizine  (Zyrtec ) 10mg  twice a day.  If no symptoms for 7-14 days then decrease to. Cetirizine  (Zyrtec ) 10mg  once a day.  May use Benadryl  (diphenhydramine ) as needed for breakthrough hives       If symptoms return, then step up dosage  Keep a detailed symptom journal including foods eaten, contact with allergens, medications taken, weather changes.  Lets recheck your IgE. 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  In case of an allergic reaction, take cetirizine  10-20 mg once every 24 hours, and if life-threatening symptoms occur, inject with EpiPen  0.3 mg.   Call the clinic if this treatment plan is not working well for you  Follow up in 6 months or sooner if needed.  Return in about 6 months (around  06/21/2024), or if symptoms worsen or fail to improve.    Thank you for the opportunity to care for this patient.  Please do not hesitate to contact me with questions.  Arlean Mutter, FNP [facility] 

## 2023-12-20 NOTE — Patient Instructions (Addendum)
 Allergic rhinitis Continue allergen avoidance measures directed toward grass pollen, tree pollen, indoor mold, dust mite, cat, dog, and cockroach as listed below Continue cetirizine  10 mg once a day if needed for a runny nose or itch. You may take an additional dose of cetirizine  10 mg once a day if needed for breakthrough symptoms  Hives (urticaria) Take the least amount of medications while ramining hive free Cetirizine  (Zyrtec ) 10mg  twice a day and famotidine  (Pepcid ) 20 mg twice a day. If no symptoms for 7-14 days then decrease to. Cetirizine  (Zyrtec ) 10mg  twice a day and famotidine  (Pepcid ) 20 mg once a day.  If no symptoms for 7-14 days then decrease to. Cetirizine  (Zyrtec ) 10mg  twice a day.  If no symptoms for 7-14 days then decrease to. Cetirizine  (Zyrtec ) 10mg  once a day.  May use Benadryl  (diphenhydramine ) as needed for breakthrough hives       If symptoms return, then step up dosage  Keep a detailed symptom journal including foods eaten, contact with allergens, medications taken, weather changes.  Lets recheck your IgE. 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  In case of an allergic reaction, take cetirizine  10-20 mg once every 24 hours, and if life-threatening symptoms occur, inject with EpiPen  0.3 mg.   Call the clinic if this treatment plan is not working well for you  Follow up in 6 months or sooner if needed.  Reducing Pollen Exposure The American Academy of Allergy , Asthma and Immunology suggests the following steps to reduce your exposure to pollen during allergy  seasons. Do not hang sheets or clothing out to dry; pollen may collect on these items. Do not mow lawns or spend time around freshly cut grass; mowing stirs up pollen. Keep windows closed at night.  Keep car windows closed while driving. Minimize morning activities outdoors, a time when  pollen counts are usually at their highest. Stay indoors as much as possible when pollen counts or humidity is high and on windy days when pollen tends to remain in the air longer. Use air conditioning when possible.  Many air conditioners have filters that trap the pollen spores. Use a HEPA room air filter to remove pollen form the indoor air you breathe.   Control of Dust Mite Allergen Dust mites play a major role in allergic asthma and rhinitis. They occur in environments with high humidity wherever human skin is found. Dust mites absorb humidity from the atmosphere (ie, they do not drink) and feed on organic matter (including shed human and animal skin). Dust mites are a microscopic type of insect that you cannot see with the naked eye. High levels of dust mites have been detected from mattresses, pillows, carpets, upholstered furniture, bed covers, clothes, soft toys and any woven material. The principal allergen of the dust mite is found in its feces. A gram of dust may contain 1,000 mites and 250,000 fecal particles. Mite antigen is easily measured in the air during house cleaning activities. Dust mites do not bite and do not cause harm to humans, other than by triggering allergies/asthma.  Ways to decrease your exposure to dust mites in your home:  1. Encase mattresses, box springs and pillows with a mite-impermeable barrier or cover  2. Wash sheets, blankets and drapes weekly in hot water (130 F) with detergent and dry them in a dryer on the hot setting.  3. Have  the room cleaned frequently with a vacuum cleaner and a damp dust-mop. For carpeting or rugs, vacuuming with a vacuum cleaner equipped with a high-efficiency particulate air (HEPA) filter. The dust mite allergic individual should not be in a room which is being cleaned and should wait 1 hour after cleaning before going into the room.  4. Do not sleep on upholstered furniture (eg, couches).  5. If possible removing carpeting,  upholstered furniture and drapery from the home is ideal. Horizontal blinds should be eliminated in the rooms where the person spends the most time (bedroom, study, television room). Washable vinyl, roller-type shades are optimal.  6. Remove all non-washable stuffed toys from the bedroom. Wash stuffed toys weekly like sheets and blankets above.  7. Reduce indoor humidity to less than 50%. Inexpensive humidity monitors can be purchased at most hardware stores. Do not use a humidifier as can make the problem worse and are not recommended.  Control of Dog or Cat Allergen Avoidance is the best way to manage a dog or cat allergy . If you have a dog or cat and are allergic to dog or cats, consider removing the dog or cat from the home. If you have a dog or cat but don't want to find it a new home, or if your family wants a pet even though someone in the household is allergic, here are some strategies that may help keep symptoms at bay:  Keep the pet out of your bedroom and restrict it to only a few rooms. Be advised that keeping the dog or cat in only one room will not limit the allergens to that room. Don't pet, hug or kiss the dog or cat; if you do, wash your hands with soap and water. High-efficiency particulate air (HEPA) cleaners run continuously in a bedroom or living room can reduce allergen levels over time. Regular use of a high-efficiency vacuum cleaner or a central vacuum can reduce allergen levels. Giving your dog or cat a bath at least once a week can reduce airborne allergen.  Control of Mold Allergen Mold and fungi can grow on a variety of surfaces provided certain temperature and moisture conditions exist.  Outdoor molds grow on plants, decaying vegetation and soil.  The major outdoor mold, Alternaria and Cladosporium, are found in very high numbers during hot and dry conditions.  Generally, a late Summer - Fall peak is seen for common outdoor fungal spores.  Rain will temporarily lower  outdoor mold spore count, but counts rise rapidly when the rainy period ends.  The most important indoor molds are Aspergillus and Penicillium.  Dark, humid and poorly ventilated basements are ideal sites for mold growth.  The next most common sites of mold growth are the bathroom and the kitchen.  Outdoor [employer] Use air conditioning and keep windows closed Avoid exposure to decaying vegetation. Avoid leaf raking. Avoid grain handling. Consider wearing a face mask if working in moldy areas.  Indoor Mold Control Maintain humidity below 50%. Clean washable surfaces with 5% bleach solution. Remove sources e.g. Contaminated carpets.  Control of Cockroach Allergen Cockroach allergen has been identified as an important cause of acute attacks of asthma, especially in urban settings.  There are fifty-five species of cockroach that exist in the [place] , however only three, the [place], [place] and [place] species produce allergen that can affect patients with Asthma.  Allergens can be obtained from fecal particles, egg casings and secretions from cockroaches.    Remove food sources. Reduce access to  water. Seal access and entry points. Spray runways with 0.5-1% Diazinon or Chlorpyrifos Blow boric acid power under stoves and refrigerator. Place bait stations (hydramethylnon) at feeding sites.

## 2023-12-21 ENCOUNTER — Encounter: Payer: Self-pay | Admitting: Family Medicine

## 2023-12-22 ENCOUNTER — Encounter (HOSPITAL_COMMUNITY): Payer: Self-pay

## 2023-12-22 ENCOUNTER — Emergency Department (HOSPITAL_COMMUNITY)

## 2023-12-22 ENCOUNTER — Emergency Department (HOSPITAL_COMMUNITY)
Admission: EM | Admit: 2023-12-22 | Discharge: 2023-12-23 | Disposition: A | Discharge status: Home / Self Care | Attending: Emergency Medicine | Admitting: Emergency Medicine

## 2023-12-22 ENCOUNTER — Other Ambulatory Visit: Payer: Self-pay

## 2023-12-22 DIAGNOSIS — I10 Essential (primary) hypertension: Secondary | ICD-10-CM | POA: Insufficient documentation

## 2023-12-22 DIAGNOSIS — K439 Ventral hernia without obstruction or gangrene: Secondary | ICD-10-CM | POA: Diagnosis not present

## 2023-12-22 DIAGNOSIS — R109 Unspecified abdominal pain: Secondary | ICD-10-CM | POA: Diagnosis not present

## 2023-12-22 DIAGNOSIS — N390 Urinary tract infection, site not specified: Secondary | ICD-10-CM | POA: Diagnosis not present

## 2023-12-22 DIAGNOSIS — Z79899 Other long term (current) drug therapy: Secondary | ICD-10-CM | POA: Insufficient documentation

## 2023-12-22 DIAGNOSIS — M549 Dorsalgia, unspecified: Secondary | ICD-10-CM | POA: Diagnosis not present

## 2023-12-22 DIAGNOSIS — K5730 Diverticulosis of large intestine without perforation or abscess without bleeding: Secondary | ICD-10-CM | POA: Diagnosis not present

## 2023-12-22 DIAGNOSIS — N281 Cyst of kidney, acquired: Secondary | ICD-10-CM | POA: Diagnosis not present

## 2023-12-22 DIAGNOSIS — K449 Diaphragmatic hernia without obstruction or gangrene: Secondary | ICD-10-CM | POA: Diagnosis not present

## 2023-12-22 DIAGNOSIS — Z7982 Long term (current) use of aspirin: Secondary | ICD-10-CM | POA: Diagnosis not present

## 2023-12-22 LAB — COMPREHENSIVE METABOLIC PANEL WITH GFR
ALT: 22 U/L (ref 0–44)
AST: 29 U/L (ref 15–41)
Albumin: 3.4 g/dL — ABNORMAL LOW (ref 3.5–5.0)
Alkaline Phosphatase: 50 U/L (ref 38–126)
Anion gap: 11 (ref 5–15)
BUN: 13 mg/dL (ref 8–23)
CO2: 22 mmol/L (ref 22–32)
Calcium: 8.7 mg/dL — ABNORMAL LOW (ref 8.9–10.3)
Chloride: 94 mmol/L — ABNORMAL LOW (ref 98–111)
Creatinine, Ser: 0.72 mg/dL (ref 0.44–1.00)
GFR, Estimated: >60 mL/min (ref >60)
Glucose, Bld: 113 mg/dL — ABNORMAL HIGH (ref 70–99)
Potassium: 3.6 mmol/L (ref 3.5–5.1)
Sodium: 127 mmol/L — ABNORMAL LOW (ref 135–145)
Total Bilirubin: 0.6 mg/dL (ref 0.0–1.2)
Total Protein: 6.3 g/dL — ABNORMAL LOW (ref 6.5–8.1)

## 2023-12-22 LAB — CBC WITH DIFFERENTIAL/PLATELET
Abs Immature Granulocytes: 0.02 K/uL (ref 0.00–0.07)
Basophils Absolute: 0.1 K/uL (ref 0.0–0.1)
Basophils Relative: 1 %
Eosinophils Absolute: 0.4 K/uL (ref 0.0–0.5)
Eosinophils Relative: 6 %
HCT: 38.8 % (ref 36.0–46.0)
Hemoglobin: 12.9 g/dL (ref 12.0–15.0)
Immature Granulocytes: 0 %
Lymphocytes Relative: 17 %
Lymphs Abs: 1.3 K/uL (ref 0.7–4.0)
MCH: 31.6 pg (ref 26.0–34.0)
MCHC: 33.2 g/dL (ref 30.0–36.0)
MCV: 95.1 fL (ref 80.0–100.0)
Monocytes Absolute: 0.9 K/uL (ref 0.1–1.0)
Monocytes Relative: 12 %
Neutro Abs: 4.8 K/uL (ref 1.7–7.7)
Neutrophils Relative %: 64 %
Platelets: 262 K/uL (ref 150–400)
RBC: 4.08 MIL/uL (ref 3.87–5.11)
RDW: 12.7 % (ref 11.5–15.5)
WBC: 7.5 K/uL (ref 4.0–10.5)
nRBC: 0 % (ref 0.0–0.2)

## 2023-12-22 LAB — URINALYSIS, ROUTINE W REFLEX MICROSCOPIC
Bilirubin Urine: NEGATIVE
Glucose, UA: NEGATIVE mg/dL
Hgb urine dipstick: NEGATIVE
Ketones, ur: NEGATIVE mg/dL
Nitrite: NEGATIVE
Protein, ur: NEGATIVE mg/dL
Specific Gravity, Urine: 1.006 (ref 1.005–1.030)
pH: 7 (ref 5.0–8.0)

## 2023-12-22 MED ORDER — HYDROCODONE-ACETAMINOPHEN 5-325 MG PO TABS: 1.0000 | ORAL_TABLET | Freq: Four times a day (QID) | ORAL | 0 refills | Status: DC | PRN

## 2023-12-22 MED ORDER — SODIUM CHLORIDE 0.9 % IV BOLUS
500.0000 mL | Freq: Once | INTRAVENOUS | Status: AC
  Administered 2023-12-22: 500 mL via INTRAVENOUS

## 2023-12-22 MED ORDER — HYDROMORPHONE HCL 1 MG/ML IJ SOLN
0.5000 mg | Freq: Once | INTRAMUSCULAR | Status: AC
  Administered 2023-12-22: 0.5 mg via INTRAVENOUS
  Filled 2023-12-22: qty 0.5

## 2023-12-22 MED ORDER — KETOROLAC TROMETHAMINE 30 MG/ML IJ SOLN
15.0000 mg | Freq: Once | INTRAMUSCULAR | Status: AC
  Administered 2023-12-22: 15 mg via INTRAVENOUS
  Filled 2023-12-22: qty 1

## 2023-12-22 MED ORDER — ONDANSETRON HCL 4 MG/2ML IJ SOLN
4.0000 mg | Freq: Once | INTRAMUSCULAR | Status: AC
  Administered 2023-12-22: 4 mg via INTRAVENOUS
  Filled 2023-12-22: qty 2

## 2023-12-22 MED ORDER — SODIUM CHLORIDE 0.9 % IV SOLN
2.0000 g | Freq: Once | INTRAVENOUS | Status: AC
  Administered 2023-12-22: 2 g via INTRAVENOUS
  Filled 2023-12-22: qty 20

## 2023-12-22 NOTE — Discharge Instructions (Signed)
Follow-up with your family doctor this week for recheck 

## 2023-12-22 NOTE — ED Provider Notes (Signed)
 [place] EMERGENCY DEPARTMENT AT [facility] Provider Note   CSN: [id number] Arrival date & time: 12/22/23  2027     Patient presents with: Flank Pain   [name] is a 78 y.o. female.  {Add pertinent medical, surgical, social history, OB history to YEP:[id number]} Patient has a history of hypertension and osteoporosis.  She complains of right flank pain.  She started treatment for urinary tract infection on Thursday  The history is provided by the patient and medical records. No language interpreter was used.  Flank Pain This is a new problem. The current episode started 2 days ago. The problem occurs constantly. The problem has not changed since onset.Pertinent negatives include no chest pain, no abdominal pain and no headaches. Nothing aggravates the symptoms. Nothing relieves the symptoms. She has tried nothing for the symptoms.       Prior to Admission medications   Medication Sig Start Date End Date Taking? Authorizing Provider  acetaminophen  (TYLENOL ) 500 MG tablet Take 500 mg by mouth every 6 (six) hours as needed.    [provider]  ALPRAZolam (XANAX) 0.25 MG tablet Take 0.25 mg by mouth daily as needed for anxiety.    [provider]  amLODipine  (NORVASC ) 5 MG tablet Take 1 tablet (5 mg total) by mouth daily. 02/15/23   Alvan Dorn FALCON, MD  aspirin  EC 81 MG tablet Take 81 mg by mouth daily. Swallow whole.    [provider]  augmented betamethasone dipropionate (DIPROLENE-AF) 0.05 % cream Apply topically 2 (two) times daily as needed. 08/07/23   [provider]  carboxymethylcellulose (REFRESH PLUS) 0.5 % SOLN 1-2 drops 3 (three) times daily as needed (dry/irritated eyes).    [provider]  cetirizine  (ZYRTEC ) 10 MG tablet Take 1 tablet by mouth twice daily 11/08/23   Iva Marty Saltness, MD  diclofenac  (VOLTAREN ) 75 MG EC tablet Take 75 mg by mouth in the morning and at bedtime. 02/10/21   [provider]   docusate sodium  (COLACE) 100 MG capsule Take 300 mg by mouth in the morning.    [provider]  donepezil (ARICEPT) 5 MG tablet Take 5 mg by mouth at bedtime. 01/29/21   [provider]  DULoxetine  (CYMBALTA ) 60 MG capsule Take 60 mg by mouth every morning. 06/17/19   [provider]  famotidine  (PEPCID ) 20 MG tablet Take 1 tablet by mouth twice daily 11/08/23   Iva Marty Saltness, MD  famotidine  (PEPCID ) 40 MG tablet Take 40 mg by mouth daily.    [provider]  ferrous sulfate  325 (65 FE) MG tablet Take 1 tablet (325 mg total) by mouth daily. 09/17/21   Rudy Neptune S, PA-C  GEMTESA 75 MG TABS Take 1 tablet by mouth daily. 11/27/21   [provider]  hydrALAZINE  (APRESOLINE ) 50 MG tablet Take 1 tablet (50 mg total) by mouth 2 (two) times daily. 11/10/20   Alvan Dorn FALCON, MD  HYDROcodone -acetaminophen  (NORCO/VICODIN) 5-325 MG tablet Take 1 tablet by mouth 2 (two) times daily as needed for moderate pain. 05/09/22   Stuart Vernell Norris, PA-C  levothyroxine  (SYNTHROID ) 50 MCG tablet Take 50 mcg by mouth daily before breakfast.  06/01/16   [provider]  losartan  (COZAAR ) 50 MG tablet Take 50 mg by mouth every evening. 02/24/21   [provider]  Magnesium 250 MG TABS Take 250 mg by mouth daily.    [provider]  nebivolol  (BYSTOLIC ) 10 MG tablet Take 1 tablet (10 mg  total) by mouth at bedtime. 06/23/19   Pearlean Manus, MD  omeprazole  (PRILOSEC) 40 MG capsule Take 1 capsule (40 mg total) by mouth daily before breakfast. 09/18/23   Harper, Kristen S, PA-C  ondansetron  (ZOFRAN ) 4 MG tablet TAKE 1 TABLET BY MOUTH EVERY 8 HOURS AS NEEDED FOR NAUSEA OR VOMITING 11/08/23   Rudy Josette RAMAN, PA-C  pravastatin  (PRAVACHOL ) 80 MG tablet Take 1 tablet (80 mg total) by mouth daily. 02/15/23   Alvan Dorn FALCON, MD  sodium chloride  1 g tablet Take 1 g by mouth daily.    [provider]  tacrolimus  (PROTOPIC ) 0.03 % ointment  Apply topically 2 (two) times daily. 09/11/23   Iva Marty Saltness, MD  traMADol (ULTRAM) 50 MG tablet Take 50 mg by mouth 2 (two) times daily as needed (pain.). 01/30/21   [provider]  triamcinolone  cream (KENALOG ) 0.1 % Apply 1 Application topically 2 (two) times daily. Avoid use on face or private areas 04/29/23   Stuart Vernell Norris, PA-C    Allergies: Carvedilol and Sulfamethoxazole    Review of Systems  Constitutional:  Negative for appetite change and fatigue.  HENT:  Negative for congestion, ear discharge and sinus pressure.   Eyes:  Negative for discharge.  Respiratory:  Negative for cough.   Cardiovascular:  Negative for chest pain.  Gastrointestinal:  Negative for abdominal pain and diarrhea.  Genitourinary:  Positive for flank pain. Negative for frequency and hematuria.  Musculoskeletal:  Negative for back pain.  Skin:  Negative for rash.  Neurological:  Negative for seizures and headaches.  Psychiatric/Behavioral:  Negative for hallucinations.     Updated Vital Signs BP (!) 144/69   Pulse 74   Temp 98 F (36.7 C)   Resp 17   Ht 4' 10 (1.473 m)   Wt 101.7 kg   SpO2 96%   BMI 46.86 kg/m   Physical Exam Vitals and nursing note reviewed.  Constitutional:      Appearance: She is well-developed.  HENT:     Head: Normocephalic.     Nose: Nose normal.  Eyes:     General: No scleral icterus.    Conjunctiva/sclera: Conjunctivae normal.  Neck:     Thyroid : No thyromegaly.  Cardiovascular:     Rate and Rhythm: Normal rate and regular rhythm.     Heart sounds: No murmur heard.    No friction rub. No gallop.  Pulmonary:     Breath sounds: No stridor. No wheezing or rales.  Chest:     Chest wall: No tenderness.  Abdominal:     General: There is no distension.     Tenderness: There is no abdominal tenderness. There is no rebound.  Genitourinary:    Comments: Tender right flank Musculoskeletal:        General: Normal range of motion.      Cervical back: Neck supple.  Lymphadenopathy:     Cervical: No cervical adenopathy.  Skin:    Findings: No erythema or rash.  Neurological:     Mental Status: She is alert and oriented to person, place, and time.     Motor: No abnormal muscle tone.     Coordination: Coordination normal.  Psychiatric:        Behavior: Behavior normal.     (all labs ordered are listed, but only abnormal results are displayed) Labs Reviewed  COMPREHENSIVE METABOLIC PANEL WITH GFR - Abnormal; Notable for the following components:      Result Value   Sodium 127 (*)  Chloride 94 (*)    Glucose, Bld 113 (*)    Calcium  8.7 (*)    Total Protein 6.3 (*)    Albumin 3.4 (*)    All other components within normal limits  CBC WITH DIFFERENTIAL/PLATELET  URINALYSIS, ROUTINE W REFLEX MICROSCOPIC    EKG: None  Radiology: CT Renal Stone Study Result Date: 12/22/2023 CLINICAL DATA:  Right flank pain. EXAM: CT ABDOMEN AND PELVIS WITHOUT CONTRAST TECHNIQUE: Multidetector CT imaging of the abdomen and pelvis was performed following the standard protocol without IV contrast. RADIATION DOSE REDUCTION: This exam was performed according to the departmental dose-optimization program which includes automated exposure control, adjustment of the mA and/or kV according to patient size and/or use of iterative reconstruction technique. COMPARISON:  Remote CT 03/25/2008 FINDINGS: Lower chest: Breathing motion artifact. Minimal right pleural thickening and trace effusion. Hepatobiliary: Multiple low-density lesions scattered throughout the liver, larger lesions measure simple fluid density. Smaller lesions are too small to characterize. These are most consistent with cysts. Decompressed gallbladder. No calcified gallstone. No biliary dilatation. Pancreas: No ductal dilatation or inflammation. Spleen: Normal in size without focal abnormality. Adrenals/Urinary Tract: No adrenal nodule. No hydronephrosis or renal calculi. Parapelvic  cysts in the left kidney. No further follow-up imaging is recommended. No perinephric inflammation. Decompressed ureters. Partially distended urinary bladder, no bladder wall thickening or stone. Stomach/Bowel: Moderate hiatal hernia. There is no small bowel obstruction or inflammation. Colonic diverticulosis without diverticulitis. The appendix is normal. Vascular/Lymphatic: Aortic and branch atherosclerosis. No aortic aneurysm. No enlarged lymph nodes by size criteria. Reproductive: Status post hysterectomy. No adnexal masses. Other: Postsurgical change of the anterior abdominal wall post hernia repair. No evidence of recurrent hernia. No ascites or free air. Musculoskeletal: Right hip arthroplasty. Chronic fusion of T11-T12 and degenerative change at T12-L1. Additional vacuum phenomena at L5-S1 with multilevel lumbar facet hypertrophy. IMPRESSION: 1. No renal stones or obstructive uropathy. No acute abnormality in the abdomen/pelvis. 2. Colonic diverticulosis without diverticulitis. 3. Moderate hiatal hernia. Aortic Atherosclerosis (ICD10-I70.0). Electronically Signed   By: Andrea Gasman M.D.   On: 12/22/2023 21:32    {Document cardiac monitor, telemetry assessment procedure when appropriate:32947} Procedures   Medications Ordered in the ED  cefTRIAXone  (ROCEPHIN ) 2 g in sodium chloride  0.9 % 100 mL IVPB (has no administration in time range)  ondansetron  (ZOFRAN ) injection 4 mg (has no administration in time range)  HYDROmorphone  (DILAUDID ) injection 0.5 mg (has no administration in time range)  sodium chloride  0.9 % bolus 500 mL (0 mLs Intravenous Stopped 12/22/23 2218)  ondansetron  (ZOFRAN ) injection 4 mg (4 mg Intravenous Given 12/22/23 2129)  ketorolac  (TORADOL ) 30 MG/ML injection 15 mg (15 mg Intravenous Given 12/22/23 2129)      {Click here for ABCD2, HEART and other calculators REFRESH Note before signing:1}                              Medical Decision Making Amount and/or Complexity of  Data Reviewed Labs: ordered. Radiology: ordered.  Risk Prescription drug management.  Flank pain most likely secondary to UTI.  Patient will continue taking her antibiotics and follow-up with her PCP  {Document critical care time when appropriate  Document review of labs and clinical decision tools ie CHADS2VASC2, etc  Document your independent review of radiology images and any outside records  Document your discussion with family members, caretakers and with consultants  Document social determinants of health affecting pt's care  Document your decision making  why or why not admission, treatments were needed:32947:::1}   Final diagnoses:  None    ED Discharge Orders     None

## 2023-12-22 NOTE — ED Triage Notes (Signed)
 Pt bib [facility] from home with c/o right sided flank pain that started 3 days ago when she was dx with UTI and started on anbx. Pt says her last dose of pain medicine was at 5pm and she can't have anymore at [facility] in [place] and is still hurting - requesting something to make pain better.

## 2023-12-22 NOTE — ED Notes (Signed)
Pt to bathroom via WC

## 2023-12-23 DIAGNOSIS — K219 Gastro-esophageal reflux disease without esophagitis: Secondary | ICD-10-CM | POA: Diagnosis not present

## 2023-12-23 DIAGNOSIS — M1990 Unspecified osteoarthritis, unspecified site: Secondary | ICD-10-CM | POA: Diagnosis not present

## 2023-12-23 DIAGNOSIS — J309 Allergic rhinitis, unspecified: Secondary | ICD-10-CM | POA: Diagnosis not present

## 2023-12-23 DIAGNOSIS — E871 Hypo-osmolality and hyponatremia: Secondary | ICD-10-CM | POA: Diagnosis not present

## 2023-12-23 DIAGNOSIS — G894 Chronic pain syndrome: Secondary | ICD-10-CM | POA: Diagnosis not present

## 2023-12-23 DIAGNOSIS — I10 Essential (primary) hypertension: Secondary | ICD-10-CM | POA: Diagnosis not present

## 2023-12-23 DIAGNOSIS — E039 Hypothyroidism, unspecified: Secondary | ICD-10-CM | POA: Diagnosis not present

## 2023-12-23 DIAGNOSIS — E782 Mixed hyperlipidemia: Secondary | ICD-10-CM | POA: Diagnosis not present

## 2023-12-23 DIAGNOSIS — N390 Urinary tract infection, site not specified: Secondary | ICD-10-CM | POA: Diagnosis not present

## 2023-12-23 MED ORDER — CEFDINIR 300 MG PO CAPS: 300.0000 mg | ORAL_CAPSULE | Freq: Two times a day (BID) | ORAL | 0 refills | Status: DC

## 2023-12-23 MED ORDER — HYDROCODONE-ACETAMINOPHEN 5-325 MG PO TABS: 1.0000 | ORAL_TABLET | ORAL | 0 refills | Status: DC | PRN

## 2023-12-23 MED ORDER — CEFDINIR 300 MG PO CAPS: 300.0000 mg | ORAL_CAPSULE | Freq: Two times a day (BID) | ORAL | 0 refills | Status: AC

## 2023-12-23 NOTE — ED Provider Notes (Signed)
  Provider Note MRN:  [id number]  Arrival date & time: 12/23/23    ED Course and Medical Decision Making  Assumed care of patient at sign-out or upon transfer.  Right flank pain, recent UTI.  Suspected pyelonephritis.  Acute on chronic hyponatremia otherwise reassuring evaluation.  Equivocal urinalysis though patient is currently taking nitrofurantoin.  Given the new flank pain pyelonephritis is favored versus MSK type pain.  Normal vitals, no signs of sepsis, appropriate for discharge.  Procedures  Final Clinical Impressions(s) / ED Diagnoses     ICD-10-CM   1. Right flank pain  R10.9       ED Discharge Orders          Ordered    cefdinir  (OMNICEF ) 300 MG capsule  2 times daily        12/23/23 0228    HYDROcodone -acetaminophen  (NORCO/VICODIN) 5-325 MG tablet  Every 4 hours PRN        12/23/23 0229    HYDROcodone -acetaminophen  (NORCO/VICODIN) 5-325 MG tablet  Every 6 hours PRN        12/22/23 2339              Discharge Instructions      You were evaluated in the Emergency Department and after careful evaluation, we did not find any emergent condition requiring admission or further testing in the hospital.  Your exam/testing today is overall reassuring.  Symptoms may be due to a kidney infection.  Stop the nitrofurantoin and start taking the Omnicef  antibiotic.  Can use the Norco tablets for more significant pain.  Recommend continuing Tylenol  or Motrin as well.  Please return to the Emergency Department if you experience any worsening of your condition.   Thank you for allowing us  to be a part of your care.    Ozell HERO. Theadore, MD [facility] Emergency Medicine [facility] [email or web address]    Theadore Ozell HERO, MD 12/23/23 [phone]

## 2023-12-23 NOTE — ED Notes (Signed)
 ED Provider at bedside.

## 2023-12-24 DIAGNOSIS — F329 Major depressive disorder, single episode, unspecified: Secondary | ICD-10-CM | POA: Diagnosis not present

## 2023-12-24 DIAGNOSIS — G629 Polyneuropathy, unspecified: Secondary | ICD-10-CM | POA: Diagnosis not present

## 2023-12-24 DIAGNOSIS — K219 Gastro-esophageal reflux disease without esophagitis: Secondary | ICD-10-CM | POA: Diagnosis not present

## 2023-12-24 DIAGNOSIS — M810 Age-related osteoporosis without current pathological fracture: Secondary | ICD-10-CM | POA: Diagnosis not present

## 2023-12-24 DIAGNOSIS — M1990 Unspecified osteoarthritis, unspecified site: Secondary | ICD-10-CM | POA: Diagnosis not present

## 2023-12-24 DIAGNOSIS — E039 Hypothyroidism, unspecified: Secondary | ICD-10-CM | POA: Diagnosis not present

## 2023-12-24 DIAGNOSIS — K449 Diaphragmatic hernia without obstruction or gangrene: Secondary | ICD-10-CM | POA: Diagnosis not present

## 2023-12-24 DIAGNOSIS — E782 Mixed hyperlipidemia: Secondary | ICD-10-CM | POA: Diagnosis not present

## 2023-12-24 DIAGNOSIS — I10 Essential (primary) hypertension: Secondary | ICD-10-CM | POA: Diagnosis not present

## 2023-12-25 ENCOUNTER — Ambulatory Visit (INDEPENDENT_AMBULATORY_CARE_PROVIDER_SITE_OTHER): Admitting: Podiatry

## 2023-12-25 DIAGNOSIS — B351 Tinea unguium: Secondary | ICD-10-CM

## 2023-12-25 DIAGNOSIS — M79671 Pain in right foot: Secondary | ICD-10-CM

## 2023-12-25 DIAGNOSIS — M7751 Other enthesopathy of right foot: Secondary | ICD-10-CM | POA: Diagnosis not present

## 2023-12-25 DIAGNOSIS — M79672 Pain in left foot: Secondary | ICD-10-CM

## 2023-12-25 NOTE — Progress Notes (Signed)
 Patient presents for evaluation and treatment of tenderness and some redness around nails feet.  Tenderness around toes with walking and wearing shoes.  Has a bunion on the right with sometimes bothers her with certain shoes with pain around the dorsal medial aspect of first metatarsal phalangeal joint.  He had surgery on the left done several years ago  Physical exam:  General appearance: Alert, pleasant, and in no acute distress.  Vascular: Pedal pulses: DP 2/4 B/L, PT 1/4 B/L.  Moderate edema lower legs bilaterally.  Capillary refill time immediate bilateral  Neurological:  Light touch intact.  Achilles tendon reflex normal  Dermatologic:  Nails thickened, disfigured, discolored 1-5 BL with subungual debris.  Redness and hypertrophic nail folds along nail folds bilaterally but no signs of drainage or infection.  Musculoskeletal:  Hammertoes 2 through 5 bilaterally.  Status post fusion first MTP and probably first TMT left.  Hallux valgus deformity right.  Normal muscle strength lower extremity.   Diagnosis: Bursitis right foot 2. Painful onychomycotic nails 1 through 5 bilaterally. 3. Pain toes 1 through 5 bilaterally.  Plan: -New patient visit office level 3 for evaluation and management.  Modifier 25. - Discussed with the the onychomycosis of the nail and recommended periodic debridement given the severity of the nail changes and dystrophy I do not think she be a good candidate for oral Lamisil.  Discussed the bunion on the right.  Given the problems she had with the surgery on the left if she is not having that much problems with it would recommend against surgery on at this point.  If he gets worse that could be addressed  -Debrided onychomycotic nails 1 through 5 bilaterally.  Return 3 months RFC

## 2023-12-27 DIAGNOSIS — M810 Age-related osteoporosis without current pathological fracture: Secondary | ICD-10-CM | POA: Diagnosis not present

## 2023-12-27 DIAGNOSIS — E039 Hypothyroidism, unspecified: Secondary | ICD-10-CM | POA: Diagnosis not present

## 2023-12-27 DIAGNOSIS — E782 Mixed hyperlipidemia: Secondary | ICD-10-CM | POA: Diagnosis not present

## 2023-12-27 DIAGNOSIS — K449 Diaphragmatic hernia without obstruction or gangrene: Secondary | ICD-10-CM | POA: Diagnosis not present

## 2023-12-27 DIAGNOSIS — I10 Essential (primary) hypertension: Secondary | ICD-10-CM | POA: Diagnosis not present

## 2023-12-27 DIAGNOSIS — M1990 Unspecified osteoarthritis, unspecified site: Secondary | ICD-10-CM | POA: Diagnosis not present

## 2023-12-27 DIAGNOSIS — K219 Gastro-esophageal reflux disease without esophagitis: Secondary | ICD-10-CM | POA: Diagnosis not present

## 2023-12-27 DIAGNOSIS — G629 Polyneuropathy, unspecified: Secondary | ICD-10-CM | POA: Diagnosis not present

## 2023-12-27 DIAGNOSIS — F329 Major depressive disorder, single episode, unspecified: Secondary | ICD-10-CM | POA: Diagnosis not present

## 2023-12-30 DIAGNOSIS — G629 Polyneuropathy, unspecified: Secondary | ICD-10-CM | POA: Diagnosis not present

## 2023-12-30 DIAGNOSIS — E782 Mixed hyperlipidemia: Secondary | ICD-10-CM | POA: Diagnosis not present

## 2023-12-30 DIAGNOSIS — M6281 Muscle weakness (generalized): Secondary | ICD-10-CM | POA: Diagnosis not present

## 2023-12-30 DIAGNOSIS — M810 Age-related osteoporosis without current pathological fracture: Secondary | ICD-10-CM | POA: Diagnosis not present

## 2023-12-30 DIAGNOSIS — K449 Diaphragmatic hernia without obstruction or gangrene: Secondary | ICD-10-CM | POA: Diagnosis not present

## 2023-12-30 DIAGNOSIS — E039 Hypothyroidism, unspecified: Secondary | ICD-10-CM | POA: Diagnosis not present

## 2023-12-30 DIAGNOSIS — W19XXXA Unspecified fall, initial encounter: Secondary | ICD-10-CM | POA: Diagnosis not present

## 2023-12-30 DIAGNOSIS — G894 Chronic pain syndrome: Secondary | ICD-10-CM | POA: Diagnosis not present

## 2023-12-30 DIAGNOSIS — K219 Gastro-esophageal reflux disease without esophagitis: Secondary | ICD-10-CM | POA: Diagnosis not present

## 2023-12-30 DIAGNOSIS — F329 Major depressive disorder, single episode, unspecified: Secondary | ICD-10-CM | POA: Diagnosis not present

## 2023-12-30 DIAGNOSIS — I10 Essential (primary) hypertension: Secondary | ICD-10-CM | POA: Diagnosis not present

## 2023-12-30 DIAGNOSIS — M1990 Unspecified osteoarthritis, unspecified site: Secondary | ICD-10-CM | POA: Diagnosis not present

## 2023-12-31 DIAGNOSIS — F419 Anxiety disorder, unspecified: Secondary | ICD-10-CM | POA: Diagnosis not present

## 2023-12-31 DIAGNOSIS — F330 Major depressive disorder, recurrent, mild: Secondary | ICD-10-CM | POA: Diagnosis not present

## 2024-01-05 DIAGNOSIS — I10 Essential (primary) hypertension: Secondary | ICD-10-CM | POA: Diagnosis not present

## 2024-01-05 DIAGNOSIS — E039 Hypothyroidism, unspecified: Secondary | ICD-10-CM | POA: Diagnosis not present

## 2024-01-05 DIAGNOSIS — G3184 Mild cognitive impairment, so stated: Secondary | ICD-10-CM | POA: Diagnosis not present

## 2024-01-05 DIAGNOSIS — E782 Mixed hyperlipidemia: Secondary | ICD-10-CM | POA: Diagnosis not present

## 2024-01-05 DIAGNOSIS — K219 Gastro-esophageal reflux disease without esophagitis: Secondary | ICD-10-CM | POA: Diagnosis not present

## 2024-01-05 DIAGNOSIS — G629 Polyneuropathy, unspecified: Secondary | ICD-10-CM | POA: Diagnosis not present

## 2024-01-05 DIAGNOSIS — F330 Major depressive disorder, recurrent, mild: Secondary | ICD-10-CM | POA: Diagnosis not present

## 2024-01-05 DIAGNOSIS — G894 Chronic pain syndrome: Secondary | ICD-10-CM | POA: Diagnosis not present

## 2024-01-05 DIAGNOSIS — F329 Major depressive disorder, single episode, unspecified: Secondary | ICD-10-CM | POA: Diagnosis not present

## 2024-01-05 DIAGNOSIS — M810 Age-related osteoporosis without current pathological fracture: Secondary | ICD-10-CM | POA: Diagnosis not present

## 2024-01-05 DIAGNOSIS — F419 Anxiety disorder, unspecified: Secondary | ICD-10-CM | POA: Diagnosis not present

## 2024-01-05 DIAGNOSIS — K449 Diaphragmatic hernia without obstruction or gangrene: Secondary | ICD-10-CM | POA: Diagnosis not present

## 2024-01-05 DIAGNOSIS — M1990 Unspecified osteoarthritis, unspecified site: Secondary | ICD-10-CM | POA: Diagnosis not present

## 2024-01-06 DIAGNOSIS — E039 Hypothyroidism, unspecified: Secondary | ICD-10-CM | POA: Diagnosis not present

## 2024-01-06 DIAGNOSIS — K219 Gastro-esophageal reflux disease without esophagitis: Secondary | ICD-10-CM | POA: Diagnosis not present

## 2024-01-06 DIAGNOSIS — M810 Age-related osteoporosis without current pathological fracture: Secondary | ICD-10-CM | POA: Diagnosis not present

## 2024-01-06 DIAGNOSIS — E782 Mixed hyperlipidemia: Secondary | ICD-10-CM | POA: Diagnosis not present

## 2024-01-06 DIAGNOSIS — G629 Polyneuropathy, unspecified: Secondary | ICD-10-CM | POA: Diagnosis not present

## 2024-01-06 DIAGNOSIS — M1990 Unspecified osteoarthritis, unspecified site: Secondary | ICD-10-CM | POA: Diagnosis not present

## 2024-01-06 DIAGNOSIS — K449 Diaphragmatic hernia without obstruction or gangrene: Secondary | ICD-10-CM | POA: Diagnosis not present

## 2024-01-06 DIAGNOSIS — I10 Essential (primary) hypertension: Secondary | ICD-10-CM | POA: Diagnosis not present

## 2024-01-06 DIAGNOSIS — F329 Major depressive disorder, single episode, unspecified: Secondary | ICD-10-CM | POA: Diagnosis not present

## 2024-01-11 DIAGNOSIS — K449 Diaphragmatic hernia without obstruction or gangrene: Secondary | ICD-10-CM | POA: Diagnosis not present

## 2024-01-11 DIAGNOSIS — K219 Gastro-esophageal reflux disease without esophagitis: Secondary | ICD-10-CM | POA: Diagnosis not present

## 2024-01-11 DIAGNOSIS — F329 Major depressive disorder, single episode, unspecified: Secondary | ICD-10-CM | POA: Diagnosis not present

## 2024-01-11 DIAGNOSIS — E039 Hypothyroidism, unspecified: Secondary | ICD-10-CM | POA: Diagnosis not present

## 2024-01-11 DIAGNOSIS — E782 Mixed hyperlipidemia: Secondary | ICD-10-CM | POA: Diagnosis not present

## 2024-01-11 DIAGNOSIS — G629 Polyneuropathy, unspecified: Secondary | ICD-10-CM | POA: Diagnosis not present

## 2024-01-11 DIAGNOSIS — M1990 Unspecified osteoarthritis, unspecified site: Secondary | ICD-10-CM | POA: Diagnosis not present

## 2024-01-11 DIAGNOSIS — M810 Age-related osteoporosis without current pathological fracture: Secondary | ICD-10-CM | POA: Diagnosis not present

## 2024-01-11 DIAGNOSIS — I10 Essential (primary) hypertension: Secondary | ICD-10-CM | POA: Diagnosis not present

## 2024-01-13 DIAGNOSIS — R600 Localized edema: Secondary | ICD-10-CM | POA: Diagnosis not present

## 2024-01-13 DIAGNOSIS — M810 Age-related osteoporosis without current pathological fracture: Secondary | ICD-10-CM | POA: Diagnosis not present

## 2024-01-13 DIAGNOSIS — K219 Gastro-esophageal reflux disease without esophagitis: Secondary | ICD-10-CM | POA: Diagnosis not present

## 2024-01-13 DIAGNOSIS — G629 Polyneuropathy, unspecified: Secondary | ICD-10-CM | POA: Diagnosis not present

## 2024-01-13 DIAGNOSIS — E782 Mixed hyperlipidemia: Secondary | ICD-10-CM | POA: Diagnosis not present

## 2024-01-13 DIAGNOSIS — K449 Diaphragmatic hernia without obstruction or gangrene: Secondary | ICD-10-CM | POA: Diagnosis not present

## 2024-01-13 DIAGNOSIS — E039 Hypothyroidism, unspecified: Secondary | ICD-10-CM | POA: Diagnosis not present

## 2024-01-13 DIAGNOSIS — F329 Major depressive disorder, single episode, unspecified: Secondary | ICD-10-CM | POA: Diagnosis not present

## 2024-01-13 DIAGNOSIS — M1990 Unspecified osteoarthritis, unspecified site: Secondary | ICD-10-CM | POA: Diagnosis not present

## 2024-01-13 DIAGNOSIS — I10 Essential (primary) hypertension: Secondary | ICD-10-CM | POA: Diagnosis not present

## 2024-01-14 DIAGNOSIS — F330 Major depressive disorder, recurrent, mild: Secondary | ICD-10-CM | POA: Diagnosis not present

## 2024-01-14 DIAGNOSIS — F419 Anxiety disorder, unspecified: Secondary | ICD-10-CM | POA: Diagnosis not present

## 2024-01-20 DIAGNOSIS — E039 Hypothyroidism, unspecified: Secondary | ICD-10-CM | POA: Diagnosis not present

## 2024-01-20 DIAGNOSIS — J309 Allergic rhinitis, unspecified: Secondary | ICD-10-CM | POA: Diagnosis not present

## 2024-01-20 DIAGNOSIS — K219 Gastro-esophageal reflux disease without esophagitis: Secondary | ICD-10-CM | POA: Diagnosis not present

## 2024-01-22 DIAGNOSIS — M810 Age-related osteoporosis without current pathological fracture: Secondary | ICD-10-CM | POA: Diagnosis not present

## 2024-01-22 DIAGNOSIS — I10 Essential (primary) hypertension: Secondary | ICD-10-CM | POA: Diagnosis not present

## 2024-01-22 DIAGNOSIS — E559 Vitamin D deficiency, unspecified: Secondary | ICD-10-CM | POA: Diagnosis not present

## 2024-01-22 DIAGNOSIS — F329 Major depressive disorder, single episode, unspecified: Secondary | ICD-10-CM | POA: Diagnosis not present

## 2024-01-22 DIAGNOSIS — E785 Hyperlipidemia, unspecified: Secondary | ICD-10-CM | POA: Diagnosis not present

## 2024-01-22 DIAGNOSIS — M1990 Unspecified osteoarthritis, unspecified site: Secondary | ICD-10-CM | POA: Diagnosis not present

## 2024-01-22 DIAGNOSIS — E782 Mixed hyperlipidemia: Secondary | ICD-10-CM | POA: Diagnosis not present

## 2024-01-22 DIAGNOSIS — G629 Polyneuropathy, unspecified: Secondary | ICD-10-CM | POA: Diagnosis not present

## 2024-01-22 DIAGNOSIS — E119 Type 2 diabetes mellitus without complications: Secondary | ICD-10-CM | POA: Diagnosis not present

## 2024-01-22 DIAGNOSIS — K219 Gastro-esophageal reflux disease without esophagitis: Secondary | ICD-10-CM | POA: Diagnosis not present

## 2024-01-22 DIAGNOSIS — E039 Hypothyroidism, unspecified: Secondary | ICD-10-CM | POA: Diagnosis not present

## 2024-01-22 DIAGNOSIS — K449 Diaphragmatic hernia without obstruction or gangrene: Secondary | ICD-10-CM | POA: Diagnosis not present

## 2024-01-24 DIAGNOSIS — E039 Hypothyroidism, unspecified: Secondary | ICD-10-CM | POA: Diagnosis not present

## 2024-01-24 DIAGNOSIS — G629 Polyneuropathy, unspecified: Secondary | ICD-10-CM | POA: Diagnosis not present

## 2024-01-27 DIAGNOSIS — E559 Vitamin D deficiency, unspecified: Secondary | ICD-10-CM | POA: Diagnosis not present

## 2024-01-27 DIAGNOSIS — G894 Chronic pain syndrome: Secondary | ICD-10-CM | POA: Diagnosis not present

## 2024-01-28 DIAGNOSIS — F419 Anxiety disorder, unspecified: Secondary | ICD-10-CM | POA: Diagnosis not present

## 2024-01-28 DIAGNOSIS — F330 Major depressive disorder, recurrent, mild: Secondary | ICD-10-CM | POA: Diagnosis not present

## 2024-02-04 DIAGNOSIS — G894 Chronic pain syndrome: Secondary | ICD-10-CM | POA: Diagnosis not present

## 2024-02-04 DIAGNOSIS — F330 Major depressive disorder, recurrent, mild: Secondary | ICD-10-CM | POA: Diagnosis not present

## 2024-02-04 DIAGNOSIS — F419 Anxiety disorder, unspecified: Secondary | ICD-10-CM | POA: Diagnosis not present

## 2024-02-11 DIAGNOSIS — F419 Anxiety disorder, unspecified: Secondary | ICD-10-CM | POA: Diagnosis not present

## 2024-02-11 DIAGNOSIS — F330 Major depressive disorder, recurrent, mild: Secondary | ICD-10-CM | POA: Diagnosis not present

## 2024-02-17 DIAGNOSIS — I10 Essential (primary) hypertension: Secondary | ICD-10-CM | POA: Diagnosis not present

## 2024-02-17 DIAGNOSIS — M159 Polyosteoarthritis, unspecified: Secondary | ICD-10-CM | POA: Diagnosis not present

## 2024-02-17 DIAGNOSIS — N3281 Overactive bladder: Secondary | ICD-10-CM | POA: Diagnosis not present

## 2024-02-17 DIAGNOSIS — R601 Generalized edema: Secondary | ICD-10-CM | POA: Diagnosis not present

## 2024-02-20 DIAGNOSIS — G3184 Mild cognitive impairment, so stated: Secondary | ICD-10-CM | POA: Diagnosis not present

## 2024-02-20 DIAGNOSIS — N182 Chronic kidney disease, stage 2 (mild): Secondary | ICD-10-CM | POA: Diagnosis not present

## 2024-02-20 DIAGNOSIS — I872 Venous insufficiency (chronic) (peripheral): Secondary | ICD-10-CM | POA: Diagnosis not present

## 2024-02-20 DIAGNOSIS — I10 Essential (primary) hypertension: Secondary | ICD-10-CM | POA: Diagnosis not present

## 2024-02-20 DIAGNOSIS — E039 Hypothyroidism, unspecified: Secondary | ICD-10-CM | POA: Diagnosis not present

## 2024-02-20 DIAGNOSIS — K219 Gastro-esophageal reflux disease without esophagitis: Secondary | ICD-10-CM | POA: Diagnosis not present

## 2024-02-22 ENCOUNTER — Emergency Department (HOSPITAL_COMMUNITY)
Admission: EM | Admit: 2024-02-22 | Discharge: 2024-02-22 | Disposition: A | Discharge status: Home / Self Care | Attending: Emergency Medicine | Admitting: Emergency Medicine

## 2024-02-22 ENCOUNTER — Other Ambulatory Visit: Payer: Self-pay

## 2024-02-22 ENCOUNTER — Emergency Department (HOSPITAL_COMMUNITY)

## 2024-02-22 ENCOUNTER — Encounter (HOSPITAL_COMMUNITY): Payer: Self-pay | Admitting: Emergency Medicine

## 2024-02-22 DIAGNOSIS — Z7982 Long term (current) use of aspirin: Secondary | ICD-10-CM | POA: Diagnosis not present

## 2024-02-22 DIAGNOSIS — Z96641 Presence of right artificial hip joint: Secondary | ICD-10-CM | POA: Diagnosis not present

## 2024-02-22 DIAGNOSIS — M79604 Pain in right leg: Secondary | ICD-10-CM | POA: Diagnosis not present

## 2024-02-22 DIAGNOSIS — M25551 Pain in right hip: Secondary | ICD-10-CM | POA: Diagnosis not present

## 2024-02-22 MED ORDER — HYDROCODONE-ACETAMINOPHEN 5-325 MG PO TABS
1.0000 | ORAL_TABLET | Freq: Once | ORAL | Status: AC
  Administered 2024-02-22: 1 via ORAL
  Filled 2024-02-22: qty 1

## 2024-02-22 MED ORDER — HYDROCODONE-ACETAMINOPHEN 5-325 MG PO TABS: 1.0000 | ORAL_TABLET | ORAL | 0 refills | Status: AC | PRN

## 2024-02-22 NOTE — Discharge Instructions (Signed)
 As we discussed, your hip and pelvis xrays are negative. Take the hydrocodone  provided for pain relief. Use your walker at all times when walking to avoid falls and further injury. Rest the hip and advance activity as tolerated.

## 2024-02-22 NOTE — ED Provider Notes (Signed)
 [place] [facility] AT [facility] Provider Note   CSN: [id number] Arrival date & time: 02/22/24  1743     Patient presents with: Hip Pain   [name] is a 78 y.o. female.   Patient to ED with right hip pain for the past 1-2 days. No falls. She reports she participates in an exercise class at [facility] where she is a resident in assisted living and feels the exercises yesterday, which included a range of motion of the hip, may have caused the pais she is having. She has a history of hip replacement in the right hip. No abdominal pain or pain into the lower extremity. No back pain.   The history is provided by the patient. No language interpreter was used.  Hip Pain       Prior to Admission medications   Medication Sig Start Date End Date Taking? Authorizing Provider  HYDROcodone -acetaminophen  (NORCO/VICODIN) 5-325 MG tablet Take 1 tablet by mouth every 4 (four) hours as needed. 02/22/24  Yes Dolphus Linch, Margit, PA-C  acetaminophen  (TYLENOL ) 500 MG tablet Take 500 mg by mouth every 6 (six) hours as needed.    [provider]  ALPRAZolam (XANAX) 0.25 MG tablet Take 0.25 mg by mouth daily as needed for anxiety.    [provider]  amLODipine  (NORVASC ) 5 MG tablet Take 1 tablet (5 mg total) by mouth daily. 02/15/23   Alvan Dorn FALCON, MD  aspirin  EC 81 MG tablet Take 81 mg by mouth daily. Swallow whole.    [provider]  augmented betamethasone dipropionate (DIPROLENE-AF) 0.05 % cream Apply topically 2 (two) times daily as needed. 08/07/23   [provider]  carboxymethylcellulose (REFRESH PLUS) 0.5 % SOLN 1-2 drops 3 (three) times daily as needed (dry/irritated eyes).    [provider]  cetirizine  (ZYRTEC ) 10 MG tablet Take 1 tablet by mouth twice daily 11/08/23   Iva Marty Saltness, MD  diclofenac  (VOLTAREN ) 75 MG EC tablet Take 75 mg by mouth in the morning and at bedtime. 02/10/21   [provider]   docusate sodium  (COLACE) 100 MG capsule Take 300 mg by mouth in the morning.    [provider]  donepezil (ARICEPT) 5 MG tablet Take 5 mg by mouth at bedtime. 01/29/21   [provider]  DULoxetine  (CYMBALTA ) 60 MG capsule Take 60 mg by mouth every morning. 06/17/19   [provider]  famotidine  (PEPCID ) 20 MG tablet Take 1 tablet by mouth twice daily 11/08/23   Iva Marty Saltness, MD  famotidine  (PEPCID ) 40 MG tablet Take 40 mg by mouth daily.    [provider]  ferrous sulfate  325 (65 FE) MG tablet Take 1 tablet (325 mg total) by mouth daily. 09/17/21   Rudy Neptune S, PA-C  GEMTESA 75 MG TABS Take 1 tablet by mouth daily. 11/27/21   [provider]  hydrALAZINE  (APRESOLINE ) 50 MG tablet Take 1 tablet (50 mg total) by mouth 2 (two) times daily. 11/10/20   Alvan Dorn FALCON, MD  levothyroxine  (SYNTHROID ) 50 MCG tablet Take 50 mcg by mouth daily before breakfast.  06/01/16   [provider]  losartan  (COZAAR ) 50 MG tablet Take 50 mg by mouth every evening. 02/24/21   [provider]  Magnesium 250 MG TABS Take 250 mg by mouth daily.    [provider]  nebivolol  (BYSTOLIC ) 10 MG tablet Take 1 tablet (10 mg total) by mouth at bedtime. 06/23/19   Pearlean Manus, MD  omeprazole  (PRILOSEC) 40 MG capsule Take 1 capsule (40 mg total) by mouth daily before breakfast. 09/18/23   Harper, Kristen S, PA-C  ondansetron  (ZOFRAN ) 4 MG tablet TAKE 1 TABLET BY MOUTH EVERY 8 HOURS AS NEEDED FOR NAUSEA OR VOMITING 11/08/23   Rudy Josette RAMAN, PA-C  pravastatin  (PRAVACHOL ) 80 MG tablet Take 1 tablet (80 mg total) by mouth daily. 02/15/23   Alvan Dorn FALCON, MD  sodium chloride  1 g tablet Take 1 g by mouth daily.    [provider]  tacrolimus  (PROTOPIC ) 0.03 % ointment Apply topically 2 (two) times daily. 09/11/23   Iva Marty Saltness, MD  traMADol (ULTRAM) 50 MG tablet Take 50 mg by mouth 2 (two) times daily as needed (pain.). 01/30/21    [provider]  triamcinolone  cream (KENALOG ) 0.1 % Apply 1 Application topically 2 (two) times daily. Avoid use on face or private areas 04/29/23   Stuart Vernell Norris, PA-C    Allergies: Carvedilol and Sulfamethoxazole    Review of Systems  Updated Vital Signs BP (!) 110/54   Pulse 75   Temp 97.9 F (36.6 C) (Oral)   Resp 18   SpO2 97%   Physical Exam Vitals and nursing note reviewed.  Constitutional:      General: She is not in acute distress.    Appearance: She is well-developed. She is not ill-appearing.  Cardiovascular:     Rate and Rhythm: Normal rate.  Pulmonary:     Effort: Pulmonary effort is normal.  Abdominal:     Palpations: Abdomen is soft.     Tenderness: There is no abdominal tenderness.  Musculoskeletal:        General: Normal range of motion.     Cervical back: Normal range of motion.     Comments: No deformity of the right hip. Tender to lateral and anterior aspects. No shortening or rotation of the right leg. No lumbar or paralumbar tenderness.   Skin:    General: Skin is warm and dry.  Neurological:     Mental Status: She is alert and oriented to person, place, and time.     (all labs ordered are listed, but only abnormal results are displayed) Labs Reviewed - No data to display  EKG: None  Radiology: DG Hip Unilat W or Wo Pelvis 2-3 Views Right Result Date: 02/22/2024 CLINICAL DATA:  Right hip pain. EXAM: DG HIP (WITH OR WITHOUT PELVIS) 2-3V RIGHT COMPARISON:  None available FINDINGS: Prior right hip replacement. No hardware complicating feature. No acute bony abnormality. Specifically, no fracture, subluxation, or dislocation. Mild joint space narrowing and spurring in the left hip. IMPRESSION: Right hip replacement.  No acute bony abnormality. Electronically Signed   By: Franky Crease M.D.   On: 02/22/2024 18:49     Procedures   Medications Ordered in the ED  HYDROcodone -acetaminophen  (NORCO/VICODIN) 5-325 MG per tablet 1  tablet (1 tablet Oral Given 02/22/24 1833)    Clinical Course as of 02/22/24 2021  Sat Feb 22, 2024  2014 Patient to ED with right hip pain since yesterday. No falls. H/O ORIF of that hip many years ago. No radicular symptoms. Xray of the right hip and pelvis are negative for acute bony abnormality. Hydrocodone  was given for pain and helped a lot. I ambulated her in the room and she is fully weight bearing on the right leg. Likely MSK pain from exercising. She is felt stable for discharge home.  [SU]    Clinical Course User Index [SU] Daionna Crossland,  Margit RIGGERS                                 Medical Decision Making Amount and/or Complexity of Data Reviewed Radiology: ordered.  Risk Prescription drug management.        Final diagnoses:  Right hip pain    ED Discharge Orders          Ordered    HYDROcodone -acetaminophen  (NORCO/VICODIN) 5-325 MG tablet  Every 4 hours PRN        02/22/24 2011               Odell Margit, RIGGERS 02/22/24 2021    Towana Ozell BROCKS, MD 02/23/24 1023

## 2024-02-22 NOTE — ED Notes (Signed)
 Daughter Reena Fortis and states they are out of town and cannot pick up their mom to go back to [place].  Advised [facility] told the pt they would not be able to bring her back.  Pt's daughter stated she would get it figured out.

## 2024-02-22 NOTE — ED Triage Notes (Signed)
 BIB EMS from [place].  Pt reports severe right hip pain, has been worsening since yesterday. Had a hip replacement on that side 20-30 years ago  Pt is able to ambulate but with pain.  Unable to lift that leg.  Pt was given tylenol  at 1515 today per staff.  Pt does report she has been working hard at PT and may have injured herself.

## 2024-02-24 DIAGNOSIS — G894 Chronic pain syndrome: Secondary | ICD-10-CM | POA: Diagnosis not present

## 2024-02-24 DIAGNOSIS — M25551 Pain in right hip: Secondary | ICD-10-CM | POA: Diagnosis not present

## 2024-02-24 MED FILL — Oxycodone w/ Acetaminophen Tab 5-325 MG: ORAL | Qty: 6 | Status: AC

## 2024-02-25 DIAGNOSIS — F419 Anxiety disorder, unspecified: Secondary | ICD-10-CM | POA: Diagnosis not present

## 2024-02-25 DIAGNOSIS — F330 Major depressive disorder, recurrent, mild: Secondary | ICD-10-CM | POA: Diagnosis not present

## 2024-03-02 DIAGNOSIS — M1990 Unspecified osteoarthritis, unspecified site: Secondary | ICD-10-CM | POA: Diagnosis not present

## 2024-03-02 DIAGNOSIS — N182 Chronic kidney disease, stage 2 (mild): Secondary | ICD-10-CM | POA: Diagnosis not present

## 2024-03-10 DIAGNOSIS — F330 Major depressive disorder, recurrent, mild: Secondary | ICD-10-CM | POA: Diagnosis not present

## 2024-03-10 DIAGNOSIS — F419 Anxiety disorder, unspecified: Secondary | ICD-10-CM | POA: Diagnosis not present

## 2024-03-16 DIAGNOSIS — M2550 Pain in unspecified joint: Secondary | ICD-10-CM | POA: Diagnosis not present

## 2024-03-16 DIAGNOSIS — K219 Gastro-esophageal reflux disease without esophagitis: Secondary | ICD-10-CM | POA: Diagnosis not present

## 2024-03-16 DIAGNOSIS — E782 Mixed hyperlipidemia: Secondary | ICD-10-CM | POA: Diagnosis not present

## 2024-03-16 DIAGNOSIS — E039 Hypothyroidism, unspecified: Secondary | ICD-10-CM | POA: Diagnosis not present

## 2024-03-19 ENCOUNTER — Encounter: Payer: Self-pay | Admitting: Nurse Practitioner

## 2024-03-19 ENCOUNTER — Ambulatory Visit: Attending: Nurse Practitioner | Admitting: Nurse Practitioner

## 2024-03-19 VITALS — BP 120/72 | HR 75 | Ht <= 58 in | Wt 228.6 lb

## 2024-03-19 DIAGNOSIS — G4733 Obstructive sleep apnea (adult) (pediatric): Secondary | ICD-10-CM

## 2024-03-19 DIAGNOSIS — I272 Pulmonary hypertension, unspecified: Secondary | ICD-10-CM | POA: Diagnosis not present

## 2024-03-19 DIAGNOSIS — E871 Hypo-osmolality and hyponatremia: Secondary | ICD-10-CM

## 2024-03-19 DIAGNOSIS — M542 Cervicalgia: Secondary | ICD-10-CM

## 2024-03-19 DIAGNOSIS — Z79899 Other long term (current) drug therapy: Secondary | ICD-10-CM | POA: Diagnosis not present

## 2024-03-19 DIAGNOSIS — I10 Essential (primary) hypertension: Secondary | ICD-10-CM

## 2024-03-19 DIAGNOSIS — E785 Hyperlipidemia, unspecified: Secondary | ICD-10-CM

## 2024-03-19 DIAGNOSIS — R600 Localized edema: Secondary | ICD-10-CM | POA: Diagnosis not present

## 2024-03-19 DIAGNOSIS — Z8673 Personal history of transient ischemic attack (TIA), and cerebral infarction without residual deficits: Secondary | ICD-10-CM

## 2024-03-19 DIAGNOSIS — R0609 Other forms of dyspnea: Secondary | ICD-10-CM

## 2024-03-19 MED ORDER — FUROSEMIDE 40 MG PO TABS: ORAL_TABLET | ORAL | 3 refills | Status: AC

## 2024-03-19 NOTE — Progress Notes (Addendum)
 Cardiology Office Note   Date:  03/19/2024 ID:  [name], DOB 14-May-1946, MRN [id number] PCP: Shona Norleen PEDLAR, MD  [facility] Providers Cardiologist:  Helen Carrier, MD     History of Present Illness [name] is a 78 y.o. female with a PMH of SHOB, HTN, leg edema, hx of TIA in 2021, HLD, OSA on CPAP, and palpitations, who presents today for follow-up.   Last seen by Dr. Carrier Helen on January 08, 2023. Was overall doing well, pravastatin  increased to improve LDL.   07/26/2023 - Today she presents for follow-up.  She admits to labile blood pressure readings and admits to dyspnea on exertion.  Says dyspnea on exertion is her chief concern and will have to sit and take breaks.  Says she goes about 15 feet and gets short of breath, then begins to start gagging.  This is new for her and has been going on since before Christmas of 2024.  Family member in room says she has been also dealing with recurrent UTIs. Denies any chest pain, palpitations, syncope, presyncope, dizziness, orthopnea, PND, swelling or significant weight changes, acute bleeding, or claudication.  She says she has been dealing with a rash along her forearms/hands and not sure what this is due to.  Says she will be seeing a dermatologist soon regarding this.   09/06/2023 -  She presents today via telephone visit as she is not feeling well and says she is feeling sick today.  She says as long as she sits still, she does okay.  Says that she eats anything, it will cause her to go straight to the bathroom.  She walks more than 20 feet, she begins gagging/vomiting.  Says the symptoms have been persistent for quite a while and usually occurs after getting up, says symptoms are now more persistent than they used to be.  She says previously when she had lab work performed that showed elevated potassium levels, she took some liquid IV and wonders if this is due to this.  Breathing is stable from last office visit.  Denies any chest pain, palpitations, syncope, presyncope, dizziness, orthopnea, PND, swelling or significant weight changes, acute bleeding, or claudication.   12/13/2023 - Here for follow-up in the office with her daughter. Tells me she is doing better since last telephone visit. Has been to an allergist and found out she is allergic to dogs. Tells me her breathing has improved. Denies any chest pain. Continues to note episodes of dry heaving/gagging with exertion, only relieved when sitting down, this has been chronic and happening over 6 months. Denies any chest pain, worsening shortness of breath, palpitations, syncope, presyncope, dizziness, orthopnea, PND, swelling or significant weight changes, acute bleeding, or claudication. Does admit to leg cramps.   03/19/2024 - Doing well at [facility]. Attends exercise classes as much as she can.  She notes a fatty polyp she notices around her neck, previous neck injury with history of cervical fusion and past history of 3 TIAs, says right side seems to be getting tighter and tighter.  Has noticed some ankle swelling over the past month. Otherwise doing well. Denies any chest pain, shortness of breath, palpitations, syncope, presyncope, dizziness, orthopnea, PND, significant weight changes, acute bleeding, or claudication.   ROS: Negative. See HPI.   Studies Reviewed  EKG:  EKG Interpretation Date/Time:  Thursday March 19 2024 15:32:51 EDT Ventricular Rate:  75 PR Interval:  190 QRS Duration:  146 QT Interval:  436 QTC Calculation: 486  R Axis:   -63  Text Interpretation: Normal sinus rhythm Left axis deviation Left ventricular hypertrophy with QRS widening and repolarization abnormality ( R in aVL , Cornell product , Romhilt-Estes ) When compared with ECG of 08-Jan-2023 13:18, Right bundle branch block is no longer Present Minimal criteria for Anteroseptal infarct are no longer Present Confirmed by Miriam Norris [phone]) on 03/19/2024 3:50:11 PM     Echo 08/2023:  1. Left ventricular ejection fraction, by estimation, is 60 to 65%. Left  ventricular ejection fraction by 3D volume is 60 %. The left ventricle has  normal function. The left ventricle has no regional wall motion  abnormalities. Left ventricular diastolic   parameters were normal.   2. Right ventricular systolic function is normal. The right ventricular  size is normal. There is moderately elevated pulmonary artery systolic  pressure. The estimated right ventricular systolic pressure is 52.0 mmHg.   3. The mitral valve is normal in structure. Trivial mitral valve  regurgitation. No evidence of mitral stenosis.   4. The aortic valve is tricuspid. There is mild calcification of the  aortic valve. Aortic valve regurgitation is not visualized. No aortic  stenosis is present. Aortic valve mean gradient measures 6.0 mmHg.   5. The inferior vena cava is normal in size with greater than 50%  respiratory variability, suggesting right atrial pressure of 3 mmHg.   6. Increased flow velocities may be secondary to anemia, thyrotoxicosis,  hyperdynamic or high flow state.   Comparison(s): Changes from prior study are noted. Moderate pulmonary HTN  present and stable LVEF.   Cardiac monitor 06/2020: 7 day monitor Min HR 55, Max HR 119, Avg HR 69 Frequent supraventricular ectopy in the form of isolated PACs, couplets, triplets Rare ventricular ectopy in the form of isolated PVCs. Reported symptoms correlated with sinus rhythm with PACs  Limited echocardiogram 06/2019: 1. Left ventricular ejection fraction, by visual estimation, is 60 to  65%. The left ventricle has normal function. There is mildly increased  left ventricular wall thickness.   2. Left ventricular diastolic parameters are consistent with Grade I  diastolic dysfunction (impaired relaxation).   3. The left ventricle has no regional wall motion abnormalities.   4. Global right ventricle has normal systolc  function.The right  ventricular size is normal. no increase in right ventricular wall  thickness.   5. Left atrial size was moderately dilated.   6. Right atrial size was mildly dilated.   7. The mitral valve is grossly normal. Mild mitral valve regurgitation.   8. The tricuspid valve was grossly normal. Tricuspid valve regurgitation  is mild.   9. Tricuspid valve regurgitation is mild.  10. No evidence of aortic valve sclerosis or stenosis.  11. Moderately elevated pulmonary artery systolic pressure.  12. The interatrial septum was not well visualized.  Physical Exam VS:  BP 120/72   Pulse 75   Ht 4' 9 (1.448 m)   Wt 228 lb 9.6 oz (103.7 kg)   SpO2 97%   BMI 49.47 kg/m        Wt Readings from Last 3 Encounters:  03/19/24 228 lb 9.6 oz (103.7 kg)  12/22/23 224 lb 3.2 oz (101.7 kg)  12/20/23 224 lb 3.2 oz (101.7 kg)    GEN: Morbidly obese 78 y.o. female in no acute distress NECK: No JVD; No carotid bruits CARDIAC: S1/S2, RRR, no murmurs, rubs, gallops RESPIRATORY:  Clear to auscultation without rales, wheezing or rhonchi  ABDOMEN: Soft, non-tender, non-distended EXTREMITIES: +1 pitting edema  to BLE, particular around ankle area; No deformity   ASSESSMENT AND PLAN  1.  Leg edema, medication management Most likely due to what appears like vascular insufficiency.  Notices this later in the day.  Per review of her medications from [facility], she is only taking Lasix 40 mg daily.  Did instruct her to take an additional Lasix 20 mg in the afternoon.  Will obtain BMET in 1 week.  Low-salt, heart healthy diet recommended.  Also recommended compression stockings.  She verbalized understanding.   2. Pulmonary HTN Etiology unclear.  Denies any recent shortness of breath.  Echo 08/2023 revealed normal EF, stable moderately elevated PASP since 2021. Group 3 PAH.  Weight is stable.  Continue current medication regimen.  Continue follow-up with PCP.  Care and ED precautions discussed.     3.  Hypertension Blood pressure stable. Discussed to monitor BP at home at least 2 hours after medications and sitting for 5-10 minutes.  No medication changes at this time.  Heart healthy diet recommended.    4.  Hyperlipidemia, history of TIAs Denies any recent symptoms.  LDL 87 in January 2025.  Continue pravastatin . Heart healthy diet and regular cardiovascular exercise encouraged. Continue to follow with PCP.  5. Hyponatremia Last Na level was stable, denies any red flag signs or symptoms. Will recheck a BMET as noted above. Continue to follow with PCP.  6. OSA on CPAP  Encouraged continued compliance.  7.  Neck pain Etiology most likely not related to her heart.  Did discuss to talk with PCP for further evaluation.  She verbalized understanding.  Disposition: Care and ED precautions discussed.  Follow-up with MD/APP in 6 months or sooner if any changes.   Medication Instructions:  Your physician has recommended you make the following change in your medication:  Take an additional dose of Lasix 20 mg in the afternoon Continue taking all other medications as prescribed   Labwork: BMET in one week   Testing/Procedures: None  Follow-Up: Your physician recommends that you schedule a follow-up appointment in: 6 months   Signed, Almarie Crate, NP

## 2024-03-19 NOTE — Patient Instructions (Addendum)
 Medication Instructions:  Your physician has recommended you make the following change in your medication:  Take an additional dose of Lasix 20 mg in the afternoon Continue taking all other medications as prescribed   Labwork: BMET in one week   Testing/Procedures: None  Follow-Up: Your physician recommends that you schedule a follow-up appointment in: 6 months  Any Other Special Instructions Will Be Listed Below (If Applicable).   If you need a refill on your cardiac medications before your next appointment, please call your pharmacy.

## 2024-03-23 DIAGNOSIS — F32A Depression, unspecified: Secondary | ICD-10-CM | POA: Diagnosis not present

## 2024-03-23 DIAGNOSIS — N3281 Overactive bladder: Secondary | ICD-10-CM | POA: Diagnosis not present

## 2024-03-23 DIAGNOSIS — E039 Hypothyroidism, unspecified: Secondary | ICD-10-CM | POA: Diagnosis not present

## 2024-03-23 DIAGNOSIS — Z79891 Long term (current) use of opiate analgesic: Secondary | ICD-10-CM | POA: Diagnosis not present

## 2024-03-23 DIAGNOSIS — G629 Polyneuropathy, unspecified: Secondary | ICD-10-CM | POA: Diagnosis not present

## 2024-03-23 DIAGNOSIS — G3184 Mild cognitive impairment, so stated: Secondary | ICD-10-CM | POA: Diagnosis not present

## 2024-03-23 DIAGNOSIS — G894 Chronic pain syndrome: Secondary | ICD-10-CM | POA: Diagnosis not present

## 2024-03-23 DIAGNOSIS — M159 Polyosteoarthritis, unspecified: Secondary | ICD-10-CM | POA: Diagnosis not present

## 2024-03-23 DIAGNOSIS — F419 Anxiety disorder, unspecified: Secondary | ICD-10-CM | POA: Diagnosis not present

## 2024-03-23 DIAGNOSIS — Z791 Long term (current) use of non-steroidal anti-inflammatories (NSAID): Secondary | ICD-10-CM | POA: Diagnosis not present

## 2024-03-23 DIAGNOSIS — I10 Essential (primary) hypertension: Secondary | ICD-10-CM | POA: Diagnosis not present

## 2024-03-25 ENCOUNTER — Ambulatory Visit: Admitting: Podiatry

## 2024-03-25 DIAGNOSIS — R0609 Other forms of dyspnea: Secondary | ICD-10-CM | POA: Diagnosis not present

## 2024-03-30 DIAGNOSIS — I10 Essential (primary) hypertension: Secondary | ICD-10-CM | POA: Diagnosis not present

## 2024-03-30 DIAGNOSIS — G3184 Mild cognitive impairment, so stated: Secondary | ICD-10-CM | POA: Diagnosis not present

## 2024-03-30 DIAGNOSIS — E039 Hypothyroidism, unspecified: Secondary | ICD-10-CM | POA: Diagnosis not present

## 2024-03-30 DIAGNOSIS — Z791 Long term (current) use of non-steroidal anti-inflammatories (NSAID): Secondary | ICD-10-CM | POA: Diagnosis not present

## 2024-03-30 DIAGNOSIS — M159 Polyosteoarthritis, unspecified: Secondary | ICD-10-CM | POA: Diagnosis not present

## 2024-03-30 DIAGNOSIS — N3281 Overactive bladder: Secondary | ICD-10-CM | POA: Diagnosis not present

## 2024-03-30 DIAGNOSIS — G629 Polyneuropathy, unspecified: Secondary | ICD-10-CM | POA: Diagnosis not present

## 2024-03-30 DIAGNOSIS — F419 Anxiety disorder, unspecified: Secondary | ICD-10-CM | POA: Diagnosis not present

## 2024-03-30 DIAGNOSIS — F32A Depression, unspecified: Secondary | ICD-10-CM | POA: Diagnosis not present

## 2024-03-31 DIAGNOSIS — F419 Anxiety disorder, unspecified: Secondary | ICD-10-CM | POA: Diagnosis not present

## 2024-03-31 DIAGNOSIS — F330 Major depressive disorder, recurrent, mild: Secondary | ICD-10-CM | POA: Diagnosis not present

## 2024-04-01 DIAGNOSIS — I10 Essential (primary) hypertension: Secondary | ICD-10-CM | POA: Diagnosis not present

## 2024-04-01 DIAGNOSIS — N182 Chronic kidney disease, stage 2 (mild): Secondary | ICD-10-CM | POA: Diagnosis not present

## 2024-04-02 DIAGNOSIS — G894 Chronic pain syndrome: Secondary | ICD-10-CM | POA: Diagnosis not present

## 2024-04-02 DIAGNOSIS — F419 Anxiety disorder, unspecified: Secondary | ICD-10-CM | POA: Diagnosis not present

## 2024-04-02 DIAGNOSIS — G3184 Mild cognitive impairment, so stated: Secondary | ICD-10-CM | POA: Diagnosis not present

## 2024-04-02 DIAGNOSIS — F330 Major depressive disorder, recurrent, mild: Secondary | ICD-10-CM | POA: Diagnosis not present

## 2024-04-06 DIAGNOSIS — N3281 Overactive bladder: Secondary | ICD-10-CM | POA: Diagnosis not present

## 2024-04-06 DIAGNOSIS — G3184 Mild cognitive impairment, so stated: Secondary | ICD-10-CM | POA: Diagnosis not present

## 2024-04-06 DIAGNOSIS — M159 Polyosteoarthritis, unspecified: Secondary | ICD-10-CM | POA: Diagnosis not present

## 2024-04-06 DIAGNOSIS — J069 Acute upper respiratory infection, unspecified: Secondary | ICD-10-CM | POA: Diagnosis not present

## 2024-04-06 DIAGNOSIS — J209 Acute bronchitis, unspecified: Secondary | ICD-10-CM | POA: Diagnosis not present

## 2024-04-06 DIAGNOSIS — F419 Anxiety disorder, unspecified: Secondary | ICD-10-CM | POA: Diagnosis not present

## 2024-04-06 DIAGNOSIS — I10 Essential (primary) hypertension: Secondary | ICD-10-CM | POA: Diagnosis not present

## 2024-04-06 DIAGNOSIS — G629 Polyneuropathy, unspecified: Secondary | ICD-10-CM | POA: Diagnosis not present

## 2024-04-06 DIAGNOSIS — E039 Hypothyroidism, unspecified: Secondary | ICD-10-CM | POA: Diagnosis not present

## 2024-04-06 DIAGNOSIS — F32A Depression, unspecified: Secondary | ICD-10-CM | POA: Diagnosis not present

## 2024-04-06 DIAGNOSIS — Z791 Long term (current) use of non-steroidal anti-inflammatories (NSAID): Secondary | ICD-10-CM | POA: Diagnosis not present

## 2024-04-07 ENCOUNTER — Ambulatory Visit: Admitting: Podiatry

## 2024-04-13 DIAGNOSIS — J019 Acute sinusitis, unspecified: Secondary | ICD-10-CM | POA: Diagnosis not present

## 2024-04-13 DIAGNOSIS — I10 Essential (primary) hypertension: Secondary | ICD-10-CM | POA: Diagnosis not present

## 2024-04-13 DIAGNOSIS — N3281 Overactive bladder: Secondary | ICD-10-CM | POA: Diagnosis not present

## 2024-04-13 DIAGNOSIS — J069 Acute upper respiratory infection, unspecified: Secondary | ICD-10-CM | POA: Diagnosis not present

## 2024-04-14 DIAGNOSIS — E039 Hypothyroidism, unspecified: Secondary | ICD-10-CM | POA: Diagnosis not present

## 2024-04-14 DIAGNOSIS — Z791 Long term (current) use of non-steroidal anti-inflammatories (NSAID): Secondary | ICD-10-CM | POA: Diagnosis not present

## 2024-04-14 DIAGNOSIS — F330 Major depressive disorder, recurrent, mild: Secondary | ICD-10-CM | POA: Diagnosis not present

## 2024-04-14 DIAGNOSIS — N3281 Overactive bladder: Secondary | ICD-10-CM | POA: Diagnosis not present

## 2024-04-14 DIAGNOSIS — G629 Polyneuropathy, unspecified: Secondary | ICD-10-CM | POA: Diagnosis not present

## 2024-04-14 DIAGNOSIS — F32A Depression, unspecified: Secondary | ICD-10-CM | POA: Diagnosis not present

## 2024-04-14 DIAGNOSIS — G3184 Mild cognitive impairment, so stated: Secondary | ICD-10-CM | POA: Diagnosis not present

## 2024-04-14 DIAGNOSIS — M159 Polyosteoarthritis, unspecified: Secondary | ICD-10-CM | POA: Diagnosis not present

## 2024-04-14 DIAGNOSIS — I10 Essential (primary) hypertension: Secondary | ICD-10-CM | POA: Diagnosis not present

## 2024-04-14 DIAGNOSIS — F419 Anxiety disorder, unspecified: Secondary | ICD-10-CM | POA: Diagnosis not present

## 2024-04-20 DIAGNOSIS — G894 Chronic pain syndrome: Secondary | ICD-10-CM | POA: Diagnosis not present

## 2024-04-20 DIAGNOSIS — J069 Acute upper respiratory infection, unspecified: Secondary | ICD-10-CM | POA: Diagnosis not present

## 2024-04-20 DIAGNOSIS — Z79891 Long term (current) use of opiate analgesic: Secondary | ICD-10-CM | POA: Diagnosis not present

## 2024-04-28 DIAGNOSIS — G3184 Mild cognitive impairment, so stated: Secondary | ICD-10-CM | POA: Diagnosis not present

## 2024-04-28 DIAGNOSIS — F419 Anxiety disorder, unspecified: Secondary | ICD-10-CM | POA: Diagnosis not present

## 2024-04-28 DIAGNOSIS — F330 Major depressive disorder, recurrent, mild: Secondary | ICD-10-CM | POA: Diagnosis not present

## 2024-04-28 DIAGNOSIS — G894 Chronic pain syndrome: Secondary | ICD-10-CM | POA: Diagnosis not present

## 2024-07-06 ENCOUNTER — Other Ambulatory Visit: Payer: Self-pay | Admitting: Allergy & Immunology

## 2024-07-15 ENCOUNTER — Ambulatory Visit: Admitting: Allergy & Immunology
